# Patient Record
Sex: Female | Born: 1935
Health system: Southern US, Community
[De-identification: ages and names within clinical notes are randomized; demographics above are authoritative.]

## PROBLEM LIST (undated history)

## (undated) DIAGNOSIS — I4891 Unspecified atrial fibrillation: Secondary | ICD-10-CM

## (undated) DIAGNOSIS — D6800 Von Willebrand disease, unspecified: Secondary | ICD-10-CM

## (undated) DIAGNOSIS — I2510 Atherosclerotic heart disease of native coronary artery without angina pectoris: Secondary | ICD-10-CM

## (undated) DIAGNOSIS — E039 Hypothyroidism, unspecified: Secondary | ICD-10-CM

## (undated) DIAGNOSIS — Z950 Presence of cardiac pacemaker: Secondary | ICD-10-CM

## (undated) DIAGNOSIS — J449 Chronic obstructive pulmonary disease, unspecified: Secondary | ICD-10-CM

## (undated) DIAGNOSIS — I5042 Chronic combined systolic (congestive) and diastolic (congestive) heart failure: Secondary | ICD-10-CM

## (undated) DIAGNOSIS — D510 Vitamin B12 deficiency anemia due to intrinsic factor deficiency: Secondary | ICD-10-CM

## (undated) DIAGNOSIS — K829 Disease of gallbladder, unspecified: Secondary | ICD-10-CM

## (undated) HISTORY — PX: APPENDECTOMY: SHX54

## (undated) HISTORY — DX: Unspecified atrial fibrillation: I48.91

## (undated) HISTORY — DX: Chronic obstructive pulmonary disease, unspecified: J44.9

## (undated) HISTORY — PX: HERNIA REPAIR: SHX51

## (undated) HISTORY — DX: Vitamin B12 deficiency anemia due to intrinsic factor deficiency: D51.0

## (undated) HISTORY — DX: Atherosclerotic heart disease of native coronary artery without angina pectoris: I25.10

## (undated) HISTORY — DX: Disease of gallbladder, unspecified: K82.9

## (undated) HISTORY — DX: Von Willebrand disease, unspecified: D68.00

## (undated) HISTORY — DX: Von Willebrand's disease: D68.0

## (undated) HISTORY — DX: Chronic combined systolic (congestive) and diastolic (congestive) heart failure: I50.42

## (undated) HISTORY — PX: MANDIBULAR RECONSTRUCTION W/ ILIAC BONE GRAFT: SHX1995

## (undated) HISTORY — DX: Hypothyroidism, unspecified: E03.9

## (undated) HISTORY — DX: Presence of cardiac pacemaker: Z95.0

## (undated) HISTORY — PX: CORONARY ARTERY BYPASS GRAFT: SHX141

---

## 1997-10-18 ENCOUNTER — Ambulatory Visit (HOSPITAL_COMMUNITY): Admission: RE | Admit: 1997-10-18 | Discharge: 1997-10-18 | Payer: Self-pay | Admitting: *Deleted

## 1997-11-16 ENCOUNTER — Other Ambulatory Visit: Admission: RE | Admit: 1997-11-16 | Discharge: 1997-11-16 | Payer: Self-pay | Admitting: *Deleted

## 1998-06-21 ENCOUNTER — Encounter: Payer: Self-pay | Admitting: Cardiology

## 1998-06-21 ENCOUNTER — Inpatient Hospital Stay (HOSPITAL_COMMUNITY): Admission: RE | Admit: 1998-06-21 | Discharge: 1998-07-01 | Payer: Self-pay | Admitting: Cardiology

## 1998-06-24 ENCOUNTER — Encounter: Payer: Self-pay | Admitting: Thoracic Surgery (Cardiothoracic Vascular Surgery)

## 1998-06-25 ENCOUNTER — Encounter: Payer: Self-pay | Admitting: Thoracic Surgery (Cardiothoracic Vascular Surgery)

## 1998-06-26 ENCOUNTER — Encounter: Payer: Self-pay | Admitting: Thoracic Surgery (Cardiothoracic Vascular Surgery)

## 1998-07-05 ENCOUNTER — Inpatient Hospital Stay (HOSPITAL_COMMUNITY): Admission: EM | Admit: 1998-07-05 | Discharge: 1998-07-08 | Payer: Self-pay | Admitting: Emergency Medicine

## 1998-07-05 ENCOUNTER — Encounter: Payer: Self-pay | Admitting: Emergency Medicine

## 1999-10-07 ENCOUNTER — Ambulatory Visit (HOSPITAL_COMMUNITY): Admission: RE | Admit: 1999-10-07 | Discharge: 1999-10-07 | Payer: Self-pay | Admitting: Gastroenterology

## 2000-02-16 ENCOUNTER — Encounter: Payer: Self-pay | Admitting: Endocrinology

## 2000-02-16 ENCOUNTER — Encounter: Admission: RE | Admit: 2000-02-16 | Discharge: 2000-02-16 | Payer: Self-pay | Admitting: Endocrinology

## 2000-06-08 ENCOUNTER — Encounter (INDEPENDENT_AMBULATORY_CARE_PROVIDER_SITE_OTHER): Payer: Self-pay

## 2000-06-08 ENCOUNTER — Ambulatory Visit (HOSPITAL_COMMUNITY): Admission: RE | Admit: 2000-06-08 | Discharge: 2000-06-08 | Payer: Self-pay | Admitting: Gastroenterology

## 2000-07-03 ENCOUNTER — Encounter: Payer: Self-pay | Admitting: Emergency Medicine

## 2000-07-03 ENCOUNTER — Emergency Department (HOSPITAL_COMMUNITY): Admission: EM | Admit: 2000-07-03 | Discharge: 2000-07-03 | Payer: Self-pay | Admitting: Emergency Medicine

## 2000-07-09 ENCOUNTER — Emergency Department (HOSPITAL_COMMUNITY): Admission: EM | Admit: 2000-07-09 | Discharge: 2000-07-09 | Payer: Self-pay | Admitting: Emergency Medicine

## 2001-01-03 ENCOUNTER — Other Ambulatory Visit: Admission: RE | Admit: 2001-01-03 | Discharge: 2001-01-03 | Payer: Self-pay | Admitting: *Deleted

## 2001-02-17 ENCOUNTER — Encounter: Payer: Self-pay | Admitting: Endocrinology

## 2001-02-17 ENCOUNTER — Encounter: Admission: RE | Admit: 2001-02-17 | Discharge: 2001-02-17 | Payer: Self-pay | Admitting: Endocrinology

## 2001-12-19 ENCOUNTER — Encounter: Payer: Self-pay | Admitting: *Deleted

## 2001-12-19 ENCOUNTER — Encounter: Admission: RE | Admit: 2001-12-19 | Discharge: 2001-12-19 | Payer: Self-pay | Admitting: *Deleted

## 2002-03-02 ENCOUNTER — Encounter: Payer: Self-pay | Admitting: Endocrinology

## 2002-03-02 ENCOUNTER — Encounter: Admission: RE | Admit: 2002-03-02 | Discharge: 2002-03-02 | Payer: Self-pay | Admitting: Endocrinology

## 2002-09-22 ENCOUNTER — Ambulatory Visit (HOSPITAL_COMMUNITY): Admission: RE | Admit: 2002-09-22 | Discharge: 2002-09-22 | Payer: Self-pay | Admitting: Oncology

## 2003-03-15 ENCOUNTER — Encounter: Admission: RE | Admit: 2003-03-15 | Discharge: 2003-03-15 | Payer: Self-pay | Admitting: Internal Medicine

## 2003-04-26 ENCOUNTER — Encounter: Admission: RE | Admit: 2003-04-26 | Discharge: 2003-04-26 | Payer: Self-pay | Admitting: Endocrinology

## 2003-05-04 ENCOUNTER — Encounter: Admission: RE | Admit: 2003-05-04 | Discharge: 2003-05-04 | Payer: Self-pay | Admitting: Endocrinology

## 2003-09-20 ENCOUNTER — Ambulatory Visit (HOSPITAL_COMMUNITY): Admission: RE | Admit: 2003-09-20 | Discharge: 2003-09-20 | Payer: Self-pay | Admitting: Gastroenterology

## 2003-09-20 ENCOUNTER — Encounter (INDEPENDENT_AMBULATORY_CARE_PROVIDER_SITE_OTHER): Payer: Self-pay | Admitting: Specialist

## 2003-11-10 ENCOUNTER — Encounter: Payer: Self-pay | Admitting: Emergency Medicine

## 2003-11-11 ENCOUNTER — Inpatient Hospital Stay (HOSPITAL_COMMUNITY): Admission: AD | Admit: 2003-11-11 | Discharge: 2003-11-16 | Payer: Self-pay | Admitting: Endocrinology

## 2004-01-19 ENCOUNTER — Inpatient Hospital Stay (HOSPITAL_COMMUNITY): Admission: EM | Admit: 2004-01-19 | Discharge: 2004-01-21 | Payer: Self-pay | Admitting: Emergency Medicine

## 2004-01-26 ENCOUNTER — Inpatient Hospital Stay (HOSPITAL_COMMUNITY): Admission: EM | Admit: 2004-01-26 | Discharge: 2004-01-30 | Payer: Self-pay | Admitting: Emergency Medicine

## 2004-01-28 ENCOUNTER — Encounter: Payer: Self-pay | Admitting: Cardiology

## 2004-04-01 ENCOUNTER — Encounter: Admission: RE | Admit: 2004-04-01 | Discharge: 2004-04-01 | Payer: Self-pay | Admitting: Endocrinology

## 2004-07-03 ENCOUNTER — Ambulatory Visit: Payer: Self-pay | Admitting: Internal Medicine

## 2004-07-18 ENCOUNTER — Ambulatory Visit: Payer: Self-pay | Admitting: Internal Medicine

## 2004-09-02 ENCOUNTER — Ambulatory Visit: Payer: Self-pay | Admitting: Internal Medicine

## 2004-09-26 ENCOUNTER — Ambulatory Visit: Payer: Self-pay | Admitting: Internal Medicine

## 2004-11-17 ENCOUNTER — Ambulatory Visit: Payer: Self-pay | Admitting: Cardiology

## 2004-12-17 ENCOUNTER — Ambulatory Visit: Payer: Self-pay | Admitting: Cardiology

## 2005-01-01 ENCOUNTER — Ambulatory Visit: Payer: Self-pay

## 2005-01-26 ENCOUNTER — Ambulatory Visit: Payer: Self-pay | Admitting: Internal Medicine

## 2005-03-18 ENCOUNTER — Ambulatory Visit: Payer: Self-pay | Admitting: Cardiology

## 2005-05-12 ENCOUNTER — Ambulatory Visit: Payer: Self-pay | Admitting: Internal Medicine

## 2005-05-27 ENCOUNTER — Encounter: Admission: RE | Admit: 2005-05-27 | Discharge: 2005-05-27 | Payer: Self-pay | Admitting: Endocrinology

## 2005-07-28 ENCOUNTER — Ambulatory Visit: Payer: Self-pay | Admitting: Internal Medicine

## 2005-08-04 ENCOUNTER — Ambulatory Visit: Payer: Self-pay | Admitting: Cardiology

## 2005-08-10 ENCOUNTER — Encounter: Admission: RE | Admit: 2005-08-10 | Discharge: 2005-08-10 | Payer: Self-pay | Admitting: Gastroenterology

## 2005-09-08 ENCOUNTER — Ambulatory Visit: Payer: Self-pay | Admitting: Internal Medicine

## 2005-11-18 ENCOUNTER — Ambulatory Visit: Payer: Self-pay | Admitting: Cardiology

## 2005-11-23 ENCOUNTER — Ambulatory Visit: Payer: Self-pay

## 2005-11-23 ENCOUNTER — Encounter: Payer: Self-pay | Admitting: Cardiology

## 2005-12-01 ENCOUNTER — Ambulatory Visit: Payer: Self-pay | Admitting: Internal Medicine

## 2006-04-30 ENCOUNTER — Ambulatory Visit: Payer: Self-pay | Admitting: Internal Medicine

## 2006-05-14 ENCOUNTER — Ambulatory Visit: Payer: Self-pay | Admitting: Cardiology

## 2006-05-17 ENCOUNTER — Ambulatory Visit: Payer: Self-pay | Admitting: Internal Medicine

## 2006-05-20 ENCOUNTER — Ambulatory Visit: Payer: Self-pay | Admitting: Internal Medicine

## 2006-05-28 ENCOUNTER — Encounter: Admission: RE | Admit: 2006-05-28 | Discharge: 2006-05-28 | Payer: Self-pay | Admitting: Endocrinology

## 2006-07-24 ENCOUNTER — Ambulatory Visit: Payer: Self-pay | Admitting: Internal Medicine

## 2006-07-24 ENCOUNTER — Inpatient Hospital Stay (HOSPITAL_COMMUNITY): Admission: EM | Admit: 2006-07-24 | Discharge: 2006-07-28 | Payer: Self-pay | Admitting: Emergency Medicine

## 2006-09-13 ENCOUNTER — Ambulatory Visit: Payer: Self-pay | Admitting: Internal Medicine

## 2006-10-12 ENCOUNTER — Ambulatory Visit: Payer: Self-pay | Admitting: Cardiology

## 2007-02-01 ENCOUNTER — Ambulatory Visit: Payer: Self-pay

## 2007-03-14 ENCOUNTER — Ambulatory Visit: Payer: Self-pay | Admitting: Internal Medicine

## 2007-03-15 ENCOUNTER — Telehealth (INDEPENDENT_AMBULATORY_CARE_PROVIDER_SITE_OTHER): Payer: Self-pay | Admitting: *Deleted

## 2007-03-21 ENCOUNTER — Ambulatory Visit: Payer: Self-pay

## 2007-04-04 ENCOUNTER — Ambulatory Visit: Payer: Self-pay | Admitting: Cardiology

## 2007-04-08 ENCOUNTER — Inpatient Hospital Stay (HOSPITAL_COMMUNITY): Admission: EM | Admit: 2007-04-08 | Discharge: 2007-04-15 | Payer: Self-pay | Admitting: Cardiology

## 2007-04-08 ENCOUNTER — Ambulatory Visit: Payer: Self-pay | Admitting: Cardiovascular Disease

## 2007-04-08 ENCOUNTER — Ambulatory Visit: Payer: Self-pay | Admitting: Internal Medicine

## 2007-04-11 ENCOUNTER — Encounter (INDEPENDENT_AMBULATORY_CARE_PROVIDER_SITE_OTHER): Payer: Self-pay | Admitting: Cardiovascular Disease

## 2007-04-22 ENCOUNTER — Ambulatory Visit: Payer: Self-pay | Admitting: Cardiology

## 2007-04-22 ENCOUNTER — Inpatient Hospital Stay (HOSPITAL_COMMUNITY): Admission: EM | Admit: 2007-04-22 | Discharge: 2007-05-02 | Payer: Self-pay | Admitting: Emergency Medicine

## 2007-04-22 ENCOUNTER — Ambulatory Visit: Payer: Self-pay | Admitting: Internal Medicine

## 2007-05-02 ENCOUNTER — Telehealth: Payer: Self-pay | Admitting: Internal Medicine

## 2007-05-16 ENCOUNTER — Ambulatory Visit: Payer: Self-pay | Admitting: Cardiology

## 2007-05-25 ENCOUNTER — Ambulatory Visit: Payer: Self-pay | Admitting: Cardiology

## 2007-05-26 ENCOUNTER — Inpatient Hospital Stay (HOSPITAL_COMMUNITY): Admission: EM | Admit: 2007-05-26 | Discharge: 2007-06-04 | Payer: Self-pay | Admitting: Emergency Medicine

## 2007-05-26 ENCOUNTER — Ambulatory Visit: Payer: Self-pay | Admitting: Internal Medicine

## 2007-05-26 DIAGNOSIS — K829 Disease of gallbladder, unspecified: Secondary | ICD-10-CM | POA: Insufficient documentation

## 2007-05-26 DIAGNOSIS — J209 Acute bronchitis, unspecified: Secondary | ICD-10-CM

## 2007-05-26 DIAGNOSIS — E039 Hypothyroidism, unspecified: Secondary | ICD-10-CM | POA: Insufficient documentation

## 2007-05-26 DIAGNOSIS — Z9189 Other specified personal risk factors, not elsewhere classified: Secondary | ICD-10-CM

## 2007-05-26 DIAGNOSIS — I2510 Atherosclerotic heart disease of native coronary artery without angina pectoris: Secondary | ICD-10-CM | POA: Insufficient documentation

## 2007-05-26 DIAGNOSIS — J4489 Other specified chronic obstructive pulmonary disease: Secondary | ICD-10-CM | POA: Insufficient documentation

## 2007-05-26 DIAGNOSIS — J449 Chronic obstructive pulmonary disease, unspecified: Secondary | ICD-10-CM

## 2007-05-26 DIAGNOSIS — D510 Vitamin B12 deficiency anemia due to intrinsic factor deficiency: Secondary | ICD-10-CM

## 2007-05-26 DIAGNOSIS — D680 Von Willebrand's disease: Secondary | ICD-10-CM

## 2007-05-26 DIAGNOSIS — I4891 Unspecified atrial fibrillation: Secondary | ICD-10-CM

## 2007-05-30 ENCOUNTER — Encounter: Payer: Self-pay | Admitting: Internal Medicine

## 2007-05-31 ENCOUNTER — Encounter: Payer: Self-pay | Admitting: Internal Medicine

## 2007-06-16 ENCOUNTER — Telehealth (INDEPENDENT_AMBULATORY_CARE_PROVIDER_SITE_OTHER): Payer: Self-pay | Admitting: *Deleted

## 2007-06-17 ENCOUNTER — Ambulatory Visit: Payer: Self-pay | Admitting: Internal Medicine

## 2007-06-22 ENCOUNTER — Telehealth (INDEPENDENT_AMBULATORY_CARE_PROVIDER_SITE_OTHER): Payer: Self-pay | Admitting: *Deleted

## 2007-06-23 ENCOUNTER — Encounter: Payer: Self-pay | Admitting: Internal Medicine

## 2007-06-30 ENCOUNTER — Encounter: Payer: Self-pay | Admitting: Internal Medicine

## 2007-07-04 ENCOUNTER — Encounter: Payer: Self-pay | Admitting: Internal Medicine

## 2007-07-08 ENCOUNTER — Telehealth (INDEPENDENT_AMBULATORY_CARE_PROVIDER_SITE_OTHER): Payer: Self-pay | Admitting: *Deleted

## 2007-07-09 ENCOUNTER — Emergency Department (HOSPITAL_COMMUNITY): Admission: EM | Admit: 2007-07-09 | Discharge: 2007-07-09 | Payer: Self-pay | Admitting: Emergency Medicine

## 2007-07-11 ENCOUNTER — Ambulatory Visit: Payer: Self-pay | Admitting: Internal Medicine

## 2007-07-13 ENCOUNTER — Telehealth: Payer: Self-pay | Admitting: Internal Medicine

## 2007-07-14 ENCOUNTER — Emergency Department (HOSPITAL_COMMUNITY): Admission: EM | Admit: 2007-07-14 | Discharge: 2007-07-15 | Payer: Self-pay | Admitting: Emergency Medicine

## 2007-08-08 ENCOUNTER — Ambulatory Visit: Payer: Self-pay | Admitting: Cardiology

## 2007-08-08 ENCOUNTER — Ambulatory Visit: Payer: Self-pay | Admitting: Internal Medicine

## 2007-08-18 ENCOUNTER — Encounter: Payer: Self-pay | Admitting: Internal Medicine

## 2007-08-28 ENCOUNTER — Ambulatory Visit: Payer: Self-pay | Admitting: Internal Medicine

## 2007-09-01 ENCOUNTER — Encounter: Payer: Self-pay | Admitting: Internal Medicine

## 2007-09-15 ENCOUNTER — Encounter: Payer: Self-pay | Admitting: Internal Medicine

## 2007-11-07 ENCOUNTER — Ambulatory Visit: Payer: Self-pay | Admitting: Internal Medicine

## 2007-11-30 ENCOUNTER — Encounter: Payer: Self-pay | Admitting: Internal Medicine

## 2007-12-09 ENCOUNTER — Encounter: Payer: Self-pay | Admitting: Internal Medicine

## 2007-12-14 ENCOUNTER — Telehealth (INDEPENDENT_AMBULATORY_CARE_PROVIDER_SITE_OTHER): Payer: Self-pay | Admitting: *Deleted

## 2007-12-16 ENCOUNTER — Telehealth (INDEPENDENT_AMBULATORY_CARE_PROVIDER_SITE_OTHER): Payer: Self-pay | Admitting: *Deleted

## 2007-12-27 ENCOUNTER — Encounter: Admission: RE | Admit: 2007-12-27 | Discharge: 2007-12-27 | Payer: Self-pay | Admitting: Endocrinology

## 2007-12-30 ENCOUNTER — Telehealth (INDEPENDENT_AMBULATORY_CARE_PROVIDER_SITE_OTHER): Payer: Self-pay | Admitting: *Deleted

## 2008-02-03 ENCOUNTER — Telehealth (INDEPENDENT_AMBULATORY_CARE_PROVIDER_SITE_OTHER): Payer: Self-pay | Admitting: *Deleted

## 2008-02-03 ENCOUNTER — Telehealth: Payer: Self-pay | Admitting: Internal Medicine

## 2008-02-10 ENCOUNTER — Ambulatory Visit: Payer: Self-pay

## 2008-02-10 ENCOUNTER — Ambulatory Visit: Payer: Self-pay | Admitting: Cardiology

## 2008-02-17 ENCOUNTER — Ambulatory Visit: Payer: Self-pay

## 2008-02-17 ENCOUNTER — Encounter: Payer: Self-pay | Admitting: Cardiology

## 2008-02-24 ENCOUNTER — Ambulatory Visit: Payer: Self-pay | Admitting: Cardiology

## 2008-02-24 ENCOUNTER — Inpatient Hospital Stay (HOSPITAL_COMMUNITY): Admission: AD | Admit: 2008-02-24 | Discharge: 2008-02-28 | Payer: Self-pay | Admitting: Cardiology

## 2008-02-29 ENCOUNTER — Inpatient Hospital Stay (HOSPITAL_COMMUNITY): Admission: EM | Admit: 2008-02-29 | Discharge: 2008-03-18 | Payer: Self-pay | Admitting: Cardiology

## 2008-02-29 ENCOUNTER — Ambulatory Visit: Payer: Self-pay | Admitting: Cardiovascular Disease

## 2008-03-01 ENCOUNTER — Ambulatory Visit: Payer: Self-pay | Admitting: Oncology

## 2008-03-07 ENCOUNTER — Encounter: Payer: Self-pay | Admitting: Cardiology

## 2008-03-21 ENCOUNTER — Ambulatory Visit: Payer: Self-pay | Admitting: Cardiovascular Disease

## 2008-03-26 ENCOUNTER — Ambulatory Visit: Payer: Self-pay | Admitting: Cardiology

## 2008-03-26 ENCOUNTER — Ambulatory Visit: Payer: Self-pay | Admitting: Cardiovascular Disease

## 2008-03-26 LAB — CONVERTED CEMR LAB
Eosinophils Relative: 2.2 % (ref 0.0–5.0)
HCT: 38.8 % (ref 36.0–46.0)
Hemoglobin: 13.6 g/dL (ref 12.0–15.0)
Lymphocytes Relative: 27 % (ref 12.0–46.0)
Monocytes Absolute: 0.7 10*3/uL (ref 0.1–1.0)
Monocytes Relative: 6.9 % (ref 3.0–12.0)
Neutro Abs: 6.1 10*3/uL (ref 1.4–7.7)
RBC: 4.5 M/uL (ref 3.87–5.11)
WBC: 9.6 10*3/uL (ref 4.5–10.5)

## 2008-04-02 ENCOUNTER — Ambulatory Visit: Payer: Self-pay | Admitting: Cardiology

## 2008-04-03 ENCOUNTER — Inpatient Hospital Stay (HOSPITAL_COMMUNITY): Admission: EM | Admit: 2008-04-03 | Discharge: 2008-04-10 | Payer: Self-pay | Admitting: Emergency Medicine

## 2008-04-09 ENCOUNTER — Encounter: Payer: Self-pay | Admitting: Cardiology

## 2008-04-12 ENCOUNTER — Ambulatory Visit: Payer: Self-pay | Admitting: Cardiology

## 2008-04-12 LAB — CONVERTED CEMR LAB
Basophils Absolute: 0.1 10*3/uL (ref 0.0–0.1)
Basophils Relative: 0.7 % (ref 0.0–3.0)
Eosinophils Absolute: 0.2 10*3/uL (ref 0.0–0.7)
Eosinophils Relative: 2.2 % (ref 0.0–5.0)
HCT: 40.3 % (ref 36.0–46.0)
MCHC: 34.8 g/dL (ref 30.0–36.0)
MCV: 87.4 fL (ref 78.0–100.0)
Monocytes Absolute: 0.6 10*3/uL (ref 0.1–1.0)
Neutrophils Relative %: 57.2 % (ref 43.0–77.0)
Platelets: 296 10*3/uL (ref 150–400)
WBC: 8.4 10*3/uL (ref 4.5–10.5)

## 2008-04-18 ENCOUNTER — Ambulatory Visit: Payer: Self-pay | Admitting: Cardiovascular Disease

## 2008-04-26 ENCOUNTER — Ambulatory Visit: Payer: Self-pay | Admitting: Internal Medicine

## 2008-05-02 ENCOUNTER — Inpatient Hospital Stay (HOSPITAL_COMMUNITY): Admission: EM | Admit: 2008-05-02 | Discharge: 2008-05-03 | Payer: Self-pay | Admitting: Emergency Medicine

## 2008-05-09 ENCOUNTER — Ambulatory Visit: Payer: Self-pay | Admitting: Cardiology

## 2008-05-09 LAB — CONVERTED CEMR LAB
ALT: 17 units/L (ref 0–35)
AST: 22 units/L (ref 0–37)
Alkaline Phosphatase: 107 units/L (ref 39–117)
Basophils Absolute: 0.1 10*3/uL (ref 0.0–0.1)
Bilirubin, Direct: 0.1 mg/dL (ref 0.0–0.3)
CO2: 32 meq/L (ref 19–32)
Chloride: 94 meq/L — ABNORMAL LOW (ref 96–112)
Glucose, Bld: 425 mg/dL — ABNORMAL HIGH (ref 70–99)
Hemoglobin: 15 g/dL (ref 12.0–15.0)
Lymphocytes Relative: 29.1 % (ref 12.0–46.0)
MCHC: 34.1 g/dL (ref 30.0–36.0)
Monocytes Relative: 4.2 % (ref 3.0–12.0)
Neutrophils Relative %: 64.3 % (ref 43.0–77.0)
RBC: 5.04 M/uL (ref 3.87–5.11)
RDW: 14 % (ref 11.5–14.6)
Sodium: 135 meq/L (ref 135–145)
Total Bilirubin: 0.8 mg/dL (ref 0.3–1.2)
Total Protein: 6.2 g/dL (ref 6.0–8.3)

## 2008-05-25 ENCOUNTER — Inpatient Hospital Stay (HOSPITAL_COMMUNITY): Admission: AD | Admit: 2008-05-25 | Discharge: 2008-06-02 | Payer: Self-pay | Admitting: Cardiology

## 2008-05-25 ENCOUNTER — Ambulatory Visit: Payer: Self-pay | Admitting: Cardiology

## 2008-05-30 ENCOUNTER — Encounter: Payer: Self-pay | Admitting: Cardiology

## 2008-06-03 ENCOUNTER — Ambulatory Visit: Payer: Self-pay | Admitting: Cardiology

## 2008-06-04 ENCOUNTER — Emergency Department (HOSPITAL_COMMUNITY): Admission: EM | Admit: 2008-06-04 | Discharge: 2008-06-05 | Payer: Self-pay | Admitting: Emergency Medicine

## 2008-06-13 ENCOUNTER — Ambulatory Visit: Payer: Self-pay | Admitting: Cardiology

## 2008-06-13 ENCOUNTER — Inpatient Hospital Stay (HOSPITAL_COMMUNITY): Admission: AD | Admit: 2008-06-13 | Discharge: 2008-06-19 | Payer: Self-pay | Admitting: Cardiology

## 2008-06-13 ENCOUNTER — Ambulatory Visit: Payer: Self-pay | Admitting: Internal Medicine

## 2008-06-13 ENCOUNTER — Ambulatory Visit: Payer: Self-pay

## 2008-06-25 ENCOUNTER — Telehealth (INDEPENDENT_AMBULATORY_CARE_PROVIDER_SITE_OTHER): Payer: Self-pay | Admitting: *Deleted

## 2008-06-29 ENCOUNTER — Ambulatory Visit: Payer: Self-pay | Admitting: Internal Medicine

## 2008-07-09 ENCOUNTER — Ambulatory Visit: Payer: Self-pay | Admitting: Cardiology

## 2008-07-17 ENCOUNTER — Ambulatory Visit: Payer: Self-pay | Admitting: Internal Medicine

## 2008-08-03 ENCOUNTER — Emergency Department (HOSPITAL_COMMUNITY): Admission: EM | Admit: 2008-08-03 | Discharge: 2008-08-03 | Payer: Self-pay | Admitting: Emergency Medicine

## 2008-08-06 ENCOUNTER — Ambulatory Visit: Payer: Self-pay | Admitting: Internal Medicine

## 2008-08-09 ENCOUNTER — Telehealth (INDEPENDENT_AMBULATORY_CARE_PROVIDER_SITE_OTHER): Payer: Self-pay | Admitting: *Deleted

## 2008-08-10 ENCOUNTER — Ambulatory Visit: Payer: Self-pay | Admitting: Internal Medicine

## 2008-08-10 ENCOUNTER — Inpatient Hospital Stay (HOSPITAL_COMMUNITY): Admission: EM | Admit: 2008-08-10 | Discharge: 2008-08-21 | Payer: Self-pay | Admitting: Emergency Medicine

## 2008-08-10 ENCOUNTER — Telehealth: Payer: Self-pay | Admitting: Nurse Practitioner

## 2008-08-10 ENCOUNTER — Ambulatory Visit: Payer: Self-pay | Admitting: Cardiovascular Disease

## 2008-08-13 ENCOUNTER — Encounter: Payer: Self-pay | Admitting: Internal Medicine

## 2008-08-14 ENCOUNTER — Telehealth: Payer: Self-pay | Admitting: Cardiology

## 2008-08-22 ENCOUNTER — Telehealth: Payer: Self-pay | Admitting: Cardiology

## 2008-08-23 ENCOUNTER — Encounter: Payer: Self-pay | Admitting: Cardiology

## 2008-09-07 ENCOUNTER — Telehealth: Payer: Self-pay | Admitting: Cardiology

## 2008-09-19 ENCOUNTER — Ambulatory Visit: Payer: Self-pay | Admitting: Internal Medicine

## 2008-09-19 DIAGNOSIS — I509 Heart failure, unspecified: Secondary | ICD-10-CM | POA: Insufficient documentation

## 2008-09-19 DIAGNOSIS — Z950 Presence of cardiac pacemaker: Secondary | ICD-10-CM

## 2008-09-25 ENCOUNTER — Encounter: Payer: Self-pay | Admitting: *Deleted

## 2008-09-28 ENCOUNTER — Encounter: Payer: Self-pay | Admitting: Cardiology

## 2008-10-03 ENCOUNTER — Encounter: Payer: Self-pay | Admitting: Cardiology

## 2008-10-08 ENCOUNTER — Encounter: Payer: Self-pay | Admitting: Cardiology

## 2008-10-10 ENCOUNTER — Emergency Department (HOSPITAL_COMMUNITY): Admission: EM | Admit: 2008-10-10 | Discharge: 2008-10-11 | Payer: Self-pay | Admitting: Emergency Medicine

## 2008-10-18 ENCOUNTER — Telehealth: Payer: Self-pay | Admitting: Cardiology

## 2008-10-18 ENCOUNTER — Encounter: Payer: Self-pay | Admitting: Cardiology

## 2008-10-19 ENCOUNTER — Encounter: Payer: Self-pay | Admitting: Cardiology

## 2008-10-19 ENCOUNTER — Encounter: Payer: Self-pay | Admitting: Emergency Medicine

## 2008-10-19 ENCOUNTER — Inpatient Hospital Stay (HOSPITAL_COMMUNITY): Admission: EM | Admit: 2008-10-19 | Discharge: 2008-10-24 | Payer: Self-pay | Admitting: Internal Medicine

## 2008-10-19 ENCOUNTER — Ambulatory Visit: Payer: Self-pay | Admitting: Cardiovascular Disease

## 2008-10-19 LAB — CONVERTED CEMR LAB
POC INR: 1.4
Prothrombin Time: 14.7 s

## 2008-10-20 ENCOUNTER — Encounter: Payer: Self-pay | Admitting: Cardiovascular Disease

## 2008-10-22 ENCOUNTER — Encounter: Payer: Self-pay | Admitting: Cardiovascular Disease

## 2008-10-31 ENCOUNTER — Encounter: Payer: Self-pay | Admitting: *Deleted

## 2008-11-20 ENCOUNTER — Encounter: Payer: Self-pay | Admitting: Cardiology

## 2008-11-28 ENCOUNTER — Telehealth (INDEPENDENT_AMBULATORY_CARE_PROVIDER_SITE_OTHER): Payer: Self-pay | Admitting: *Deleted

## 2008-12-04 ENCOUNTER — Encounter: Payer: Self-pay | Admitting: Internal Medicine

## 2008-12-05 ENCOUNTER — Encounter: Payer: Self-pay | Admitting: Internal Medicine

## 2008-12-07 ENCOUNTER — Inpatient Hospital Stay (HOSPITAL_COMMUNITY): Admission: EM | Admit: 2008-12-07 | Discharge: 2008-12-19 | Payer: Self-pay | Admitting: Emergency Medicine

## 2008-12-07 ENCOUNTER — Ambulatory Visit: Payer: Self-pay | Admitting: Cardiology

## 2008-12-28 ENCOUNTER — Encounter: Payer: Self-pay | Admitting: Internal Medicine

## 2009-01-02 ENCOUNTER — Encounter: Payer: Self-pay | Admitting: Cardiology

## 2009-01-02 LAB — CONVERTED CEMR LAB: Prothrombin Time: 18.5 s

## 2009-01-09 ENCOUNTER — Encounter: Payer: Self-pay | Admitting: Internal Medicine

## 2009-01-09 ENCOUNTER — Telehealth: Payer: Self-pay | Admitting: Internal Medicine

## 2009-01-09 LAB — CONVERTED CEMR LAB: POC INR: 2.6

## 2009-01-25 ENCOUNTER — Encounter: Payer: Self-pay | Admitting: Internal Medicine

## 2009-01-25 LAB — CONVERTED CEMR LAB
POC INR: 2.6
Prothrombin Time: 19.6 s

## 2009-02-15 ENCOUNTER — Encounter: Payer: Self-pay | Admitting: Cardiology

## 2009-02-15 LAB — CONVERTED CEMR LAB: POC INR: 3.3

## 2009-02-21 ENCOUNTER — Encounter: Payer: Self-pay | Admitting: Cardiology

## 2009-03-06 ENCOUNTER — Encounter: Payer: Self-pay | Admitting: Cardiology

## 2009-03-20 ENCOUNTER — Encounter: Payer: Self-pay | Admitting: Internal Medicine

## 2009-03-20 LAB — CONVERTED CEMR LAB
POC INR: 2
Prothrombin Time: 17.5 s

## 2009-03-26 ENCOUNTER — Encounter: Payer: Self-pay | Admitting: Cardiology

## 2009-04-11 ENCOUNTER — Encounter: Payer: Self-pay | Admitting: Cardiovascular Disease

## 2009-04-11 LAB — CONVERTED CEMR LAB: POC INR: 2.3

## 2009-04-12 ENCOUNTER — Encounter: Payer: Self-pay | Admitting: Cardiovascular Disease

## 2009-05-02 ENCOUNTER — Encounter: Payer: Self-pay | Admitting: Cardiovascular Disease

## 2009-05-02 LAB — CONVERTED CEMR LAB: POC INR: 1.9

## 2009-05-22 ENCOUNTER — Encounter: Payer: Self-pay | Admitting: Cardiology

## 2009-05-30 ENCOUNTER — Encounter: Payer: Self-pay | Admitting: Cardiology

## 2009-06-10 ENCOUNTER — Ambulatory Visit: Payer: Self-pay | Admitting: Internal Medicine

## 2009-06-10 ENCOUNTER — Inpatient Hospital Stay (HOSPITAL_COMMUNITY): Admission: EM | Admit: 2009-06-10 | Discharge: 2009-06-18 | Payer: Self-pay | Admitting: Emergency Medicine

## 2009-06-14 ENCOUNTER — Ambulatory Visit: Payer: Self-pay | Admitting: Vascular Surgery

## 2009-06-14 ENCOUNTER — Encounter (INDEPENDENT_AMBULATORY_CARE_PROVIDER_SITE_OTHER): Payer: Self-pay | Admitting: Internal Medicine

## 2009-06-20 ENCOUNTER — Ambulatory Visit: Payer: Self-pay | Admitting: Cardiology

## 2009-06-20 ENCOUNTER — Encounter (INDEPENDENT_AMBULATORY_CARE_PROVIDER_SITE_OTHER): Payer: Self-pay | Admitting: Cardiology

## 2009-06-22 ENCOUNTER — Encounter: Payer: Self-pay | Admitting: Internal Medicine

## 2009-06-24 ENCOUNTER — Ambulatory Visit: Payer: Self-pay | Admitting: Internal Medicine

## 2009-06-25 ENCOUNTER — Encounter: Payer: Self-pay | Admitting: Cardiology

## 2009-06-27 ENCOUNTER — Encounter: Payer: Self-pay | Admitting: Cardiology

## 2009-06-27 LAB — CONVERTED CEMR LAB: POC INR: 2.2

## 2009-07-02 ENCOUNTER — Encounter: Payer: Self-pay | Admitting: Cardiology

## 2009-07-03 ENCOUNTER — Inpatient Hospital Stay (HOSPITAL_COMMUNITY): Admission: EM | Admit: 2009-07-03 | Discharge: 2009-07-07 | Payer: Self-pay | Admitting: Emergency Medicine

## 2009-07-10 ENCOUNTER — Encounter: Payer: Self-pay | Admitting: Internal Medicine

## 2009-07-10 LAB — CONVERTED CEMR LAB
POC INR: 2
Prothrombin Time: 17.3 s

## 2009-07-16 ENCOUNTER — Encounter: Payer: Self-pay | Admitting: Cardiovascular Disease

## 2009-07-23 ENCOUNTER — Encounter: Payer: Self-pay | Admitting: Internal Medicine

## 2009-08-05 ENCOUNTER — Telehealth: Payer: Self-pay | Admitting: Internal Medicine

## 2009-08-06 ENCOUNTER — Encounter: Payer: Self-pay | Admitting: Cardiovascular Disease

## 2009-08-06 LAB — CONVERTED CEMR LAB: Prothrombin Time: 14.4 s

## 2009-08-12 ENCOUNTER — Encounter: Payer: Self-pay | Admitting: Cardiology

## 2009-08-13 ENCOUNTER — Encounter: Payer: Self-pay | Admitting: Cardiovascular Disease

## 2009-08-13 LAB — CONVERTED CEMR LAB: POC INR: 3.5

## 2009-08-20 ENCOUNTER — Encounter: Payer: Self-pay | Admitting: Cardiovascular Disease

## 2009-08-20 LAB — CONVERTED CEMR LAB: Prothrombin Time: 19 s

## 2009-08-27 ENCOUNTER — Encounter: Payer: Self-pay | Admitting: Cardiology

## 2009-08-27 LAB — CONVERTED CEMR LAB: POC INR: 2

## 2009-09-03 ENCOUNTER — Encounter: Payer: Self-pay | Admitting: Cardiovascular Disease

## 2009-09-03 LAB — CONVERTED CEMR LAB
POC INR: 3
Prothrombin Time: 20.8 s

## 2009-09-10 ENCOUNTER — Encounter: Payer: Self-pay | Admitting: Internal Medicine

## 2009-09-17 ENCOUNTER — Encounter: Payer: Self-pay | Admitting: Internal Medicine

## 2009-09-17 LAB — CONVERTED CEMR LAB
POC INR: 2.3
Prothrombin Time: 16.4 s

## 2009-09-24 ENCOUNTER — Encounter: Payer: Self-pay | Admitting: Internal Medicine

## 2009-09-24 LAB — CONVERTED CEMR LAB: POC INR: 1.4

## 2009-09-27 ENCOUNTER — Encounter (INDEPENDENT_AMBULATORY_CARE_PROVIDER_SITE_OTHER): Payer: Self-pay | Admitting: *Deleted

## 2009-09-30 ENCOUNTER — Encounter: Payer: Self-pay | Admitting: Cardiology

## 2009-09-30 LAB — CONVERTED CEMR LAB
POC INR: 1.6
Prothrombin Time: 15.5 s

## 2009-10-08 ENCOUNTER — Encounter: Payer: Self-pay | Admitting: Cardiovascular Disease

## 2009-10-08 LAB — CONVERTED CEMR LAB
POC INR: 1.7
Prothrombin Time: 16.2 s

## 2009-10-15 ENCOUNTER — Encounter: Payer: Self-pay | Admitting: Cardiology

## 2009-10-15 LAB — CONVERTED CEMR LAB: Prothrombin Time: 15.1 s

## 2009-10-21 ENCOUNTER — Encounter: Payer: Self-pay | Admitting: Cardiology

## 2009-10-29 ENCOUNTER — Encounter: Payer: Self-pay | Admitting: Internal Medicine

## 2009-11-13 ENCOUNTER — Encounter: Payer: Self-pay | Admitting: Cardiology

## 2009-11-13 LAB — CONVERTED CEMR LAB
POC INR: 1.6
Prothrombin Time: 19.2 s

## 2009-11-21 ENCOUNTER — Encounter (INDEPENDENT_AMBULATORY_CARE_PROVIDER_SITE_OTHER): Payer: Self-pay | Admitting: *Deleted

## 2009-11-26 ENCOUNTER — Encounter: Payer: Self-pay | Admitting: Cardiology

## 2009-11-26 LAB — CONVERTED CEMR LAB
POC INR: 2.3
Prothrombin Time: 27.9 s

## 2009-11-28 ENCOUNTER — Ambulatory Visit: Payer: Self-pay | Admitting: Internal Medicine

## 2009-11-28 ENCOUNTER — Encounter: Payer: Self-pay | Admitting: Internal Medicine

## 2009-12-10 ENCOUNTER — Encounter: Payer: Self-pay | Admitting: Cardiology

## 2009-12-10 LAB — CONVERTED CEMR LAB
POC INR: 3.1
Prothrombin Time: 37.1 s

## 2009-12-13 ENCOUNTER — Encounter: Payer: Self-pay | Admitting: Internal Medicine

## 2009-12-18 ENCOUNTER — Encounter (INDEPENDENT_AMBULATORY_CARE_PROVIDER_SITE_OTHER): Payer: Self-pay | Admitting: *Deleted

## 2009-12-20 ENCOUNTER — Telehealth: Payer: Self-pay | Admitting: Cardiology

## 2009-12-27 ENCOUNTER — Encounter: Payer: Self-pay | Admitting: Cardiology

## 2010-01-07 ENCOUNTER — Encounter: Payer: Self-pay | Admitting: Cardiovascular Disease

## 2010-01-07 LAB — CONVERTED CEMR LAB: Prothrombin Time: 23.4 s

## 2010-01-09 ENCOUNTER — Ambulatory Visit: Payer: Self-pay | Admitting: Internal Medicine

## 2010-01-21 ENCOUNTER — Encounter: Payer: Self-pay | Admitting: Internal Medicine

## 2010-01-21 ENCOUNTER — Encounter: Payer: Self-pay | Admitting: Cardiovascular Disease

## 2010-01-21 LAB — CONVERTED CEMR LAB: POC INR: 1.8

## 2010-02-03 ENCOUNTER — Telehealth: Payer: Self-pay | Admitting: Cardiology

## 2010-02-27 ENCOUNTER — Encounter: Payer: Self-pay | Admitting: Internal Medicine

## 2010-02-27 ENCOUNTER — Encounter: Payer: Self-pay | Admitting: Cardiology

## 2010-02-27 ENCOUNTER — Ambulatory Visit: Payer: Self-pay

## 2010-02-27 ENCOUNTER — Ambulatory Visit: Payer: Self-pay | Admitting: Cardiology

## 2010-02-27 DIAGNOSIS — I10 Essential (primary) hypertension: Secondary | ICD-10-CM | POA: Insufficient documentation

## 2010-02-27 LAB — CONVERTED CEMR LAB
Basophils Relative: 0.3 % (ref 0.0–3.0)
CO2: 26 meq/L (ref 19–32)
Calcium: 8.6 mg/dL (ref 8.4–10.5)
Chloride: 95 meq/L — ABNORMAL LOW (ref 96–112)
Eosinophils Absolute: 0.1 10*3/uL (ref 0.0–0.7)
HCT: 36.4 % (ref 36.0–46.0)
Hemoglobin: 12.2 g/dL (ref 12.0–15.0)
Lymphocytes Relative: 21.6 % (ref 12.0–46.0)
Lymphs Abs: 1.5 10*3/uL (ref 0.7–4.0)
MCHC: 33.4 g/dL (ref 30.0–36.0)
Neutro Abs: 5 10*3/uL (ref 1.4–7.7)
Potassium: 4.1 meq/L (ref 3.5–5.1)
RBC: 4.74 M/uL (ref 3.87–5.11)
Sodium: 133 meq/L — ABNORMAL LOW (ref 135–145)

## 2010-03-06 ENCOUNTER — Ambulatory Visit: Payer: Self-pay | Admitting: Internal Medicine

## 2010-03-09 ENCOUNTER — Inpatient Hospital Stay (HOSPITAL_COMMUNITY): Admission: EM | Admit: 2010-03-09 | Discharge: 2010-03-11 | Payer: Self-pay | Admitting: Cardiology

## 2010-03-09 ENCOUNTER — Ambulatory Visit: Payer: Self-pay | Admitting: Interventional Cardiology

## 2010-03-13 ENCOUNTER — Telehealth: Payer: Self-pay | Admitting: Cardiology

## 2010-03-19 ENCOUNTER — Encounter: Payer: Self-pay | Admitting: Internal Medicine

## 2010-03-25 ENCOUNTER — Emergency Department (HOSPITAL_COMMUNITY): Admission: EM | Admit: 2010-03-25 | Discharge: 2010-03-25 | Payer: Self-pay | Admitting: Emergency Medicine

## 2010-05-18 ENCOUNTER — Encounter: Payer: Self-pay | Admitting: Gastroenterology

## 2010-05-19 ENCOUNTER — Encounter: Payer: Self-pay | Admitting: Endocrinology

## 2010-05-27 ENCOUNTER — Encounter: Payer: Self-pay | Admitting: Cardiology

## 2010-05-27 ENCOUNTER — Inpatient Hospital Stay (HOSPITAL_COMMUNITY)
Admission: EM | Admit: 2010-05-27 | Discharge: 2010-05-30 | DRG: 313 | Disposition: A | Discharge status: Home Health | Payer: Medicare Other | Attending: Family Medicine | Admitting: Family Medicine

## 2010-05-27 DIAGNOSIS — I509 Heart failure, unspecified: Secondary | ICD-10-CM | POA: Diagnosis present

## 2010-05-27 DIAGNOSIS — E119 Type 2 diabetes mellitus without complications: Secondary | ICD-10-CM | POA: Diagnosis present

## 2010-05-27 DIAGNOSIS — E876 Hypokalemia: Secondary | ICD-10-CM | POA: Diagnosis present

## 2010-05-27 DIAGNOSIS — I2510 Atherosclerotic heart disease of native coronary artery without angina pectoris: Secondary | ICD-10-CM | POA: Diagnosis present

## 2010-05-27 DIAGNOSIS — J449 Chronic obstructive pulmonary disease, unspecified: Secondary | ICD-10-CM | POA: Diagnosis present

## 2010-05-27 DIAGNOSIS — R072 Precordial pain: Secondary | ICD-10-CM

## 2010-05-27 DIAGNOSIS — E039 Hypothyroidism, unspecified: Secondary | ICD-10-CM | POA: Diagnosis present

## 2010-05-27 DIAGNOSIS — F0390 Unspecified dementia without behavioral disturbance: Secondary | ICD-10-CM | POA: Diagnosis present

## 2010-05-27 DIAGNOSIS — Z7901 Long term (current) use of anticoagulants: Secondary | ICD-10-CM

## 2010-05-27 DIAGNOSIS — I5032 Chronic diastolic (congestive) heart failure: Secondary | ICD-10-CM | POA: Diagnosis present

## 2010-05-27 DIAGNOSIS — Z950 Presence of cardiac pacemaker: Secondary | ICD-10-CM

## 2010-05-27 DIAGNOSIS — Z951 Presence of aortocoronary bypass graft: Secondary | ICD-10-CM

## 2010-05-27 DIAGNOSIS — J4489 Other specified chronic obstructive pulmonary disease: Secondary | ICD-10-CM | POA: Diagnosis present

## 2010-05-27 DIAGNOSIS — Z794 Long term (current) use of insulin: Secondary | ICD-10-CM

## 2010-05-27 DIAGNOSIS — R0789 Other chest pain: Principal | ICD-10-CM | POA: Diagnosis present

## 2010-05-27 DIAGNOSIS — R197 Diarrhea, unspecified: Secondary | ICD-10-CM | POA: Diagnosis present

## 2010-05-27 DIAGNOSIS — D638 Anemia in other chronic diseases classified elsewhere: Secondary | ICD-10-CM | POA: Diagnosis present

## 2010-05-27 LAB — HEMOGLOBIN A1C: Mean Plasma Glucose: 266 mg/dL — ABNORMAL HIGH (ref <117)

## 2010-05-27 LAB — CK TOTAL AND CKMB (NOT AT ARMC)
CK, MB: 2.1 ng/mL (ref 0.3–4.0)
Relative Index: 1.8 (ref 0.0–2.5)
Total CK: 116 U/L (ref 7–177)

## 2010-05-27 LAB — CBC
HCT: 37.9 % (ref 36.0–46.0)
Hemoglobin: 11.7 g/dL — ABNORMAL LOW (ref 12.0–15.0)
MCH: 23.8 pg — ABNORMAL LOW (ref 26.0–34.0)
MCV: 77 fL — ABNORMAL LOW (ref 78.0–100.0)
RBC: 4.92 MIL/uL (ref 3.87–5.11)
WBC: 9.2 10*3/uL (ref 4.0–10.5)

## 2010-05-27 LAB — DIFFERENTIAL
Lymphocytes Relative: 19 % (ref 12–46)
Lymphs Abs: 1.8 10*3/uL (ref 0.7–4.0)
Monocytes Relative: 8 % (ref 3–12)
Neutrophils Relative %: 72 % (ref 43–77)

## 2010-05-27 LAB — URINALYSIS, ROUTINE W REFLEX MICROSCOPIC
Bilirubin Urine: NEGATIVE
Hgb urine dipstick: NEGATIVE
Ketones, ur: NEGATIVE mg/dL
Protein, ur: NEGATIVE mg/dL
Urobilinogen, UA: 0.2 mg/dL (ref 0.0–1.0)

## 2010-05-27 LAB — CARDIAC PANEL(CRET KIN+CKTOT+MB+TROPI)
CK, MB: 2.6 ng/mL (ref 0.3–4.0)
CK, MB: 2.9 ng/mL (ref 0.3–4.0)
Relative Index: 2.2 (ref 0.0–2.5)
Total CK: 127 U/L (ref 7–177)
Total CK: 134 U/L (ref 7–177)

## 2010-05-27 LAB — BASIC METABOLIC PANEL
CO2: 26 mEq/L (ref 19–32)
Calcium: 8.6 mg/dL (ref 8.4–10.5)
Chloride: 99 mEq/L (ref 96–112)
GFR calc Af Amer: 59 mL/min — ABNORMAL LOW (ref >60)
Potassium: 3.4 mEq/L — ABNORMAL LOW (ref 3.5–5.1)
Sodium: 138 mEq/L (ref 135–145)

## 2010-05-27 LAB — GLUCOSE, CAPILLARY: Glucose-Capillary: 287 mg/dL — ABNORMAL HIGH (ref 70–99)

## 2010-05-27 LAB — LIPID PANEL
Total CHOL/HDL Ratio: 3.3 RATIO
VLDL: 31 mg/dL (ref 0–40)

## 2010-05-27 LAB — POCT CARDIAC MARKERS: Troponin i, poc: <0.05 ng/mL (ref 0.00–0.09)

## 2010-05-27 NOTE — Medication Information (Signed)
Summary: Coumadin Clinic  Anticoagulant Therapy  Managed by: Cloyde Reams, RN, BSN PCP: Rockwell Alexandria Supervising MD: Excell Seltzer MD, Casimiro Needle Indication 1: Atrial Fibrillation (ICD-427.31) Lab Used: [facility] [place] Site: [address] PT 17.0 INR POC 1.9 INR RANGE 2 - 3    Bleeding/hemorrhagic complications: no     Any changes in medication regimen? yes       Details: Completed zpak today.     Any missed doses?: no         Allergies: 1)  ! Biaxin 2)  ! Cephalexin 3)  ! * Carbamazepine 4)  ! * Dicyclomine 5)  ! Sulfa 6)  ! Augmentin 7)  Cefaclor (Cefaclor) 8)  Dicyclomine Hcl (Dicyclomine Hcl) 9)  Sulfamethoxazole (Sulfamethoxazole)  Anticoagulation Management History:      Her anticoagulation is being managed by telephone today.  Positive risk factors for bleeding include an age of 75 years or older and presence of serious comorbidities.  The bleeding index is 'intermediate risk'.  Positive CHADS2 values include History of CHF, Age > 31 years old, and History of Diabetes.  The start date was 02/29/2008.  Prothrombin time is 17.0.  Anticoagulation responsible provider: Excell Seltzer MD, Casimiro Needle.  INR POC: 1.9.    Anticoagulation Management Assessment/Plan:      The patient's current anticoagulation dose is Warfarin sodium 5 mg tabs: Use as directed by Anticoagulation Clinic.  The target INR is 2 - 3.  The next INR is due 05/23/2009.  Anticoagulation instructions were given to home health nurse/Robin(daughter).  Results were reviewed/authorized by Cloyde Reams, RN, BSN.  She was notified by Cloyde Reams RN.         Prior Anticoagulation Instructions: INR 2.3  Called spoke with pt's daughter [name], advised to continue on same dosage 5mg  daily except 7.5mg  on Mondays and Thursdays.   Recheck in 3 weeks.  Called [facility] orders to redraw on 05/02/08 given to Ouida Sills, Red team.  Current Anticoagulation Instructions: INR 1.9  Spoke with [name], [facility] RN while at  pt's house.  Advised to have pt take and extra 1/2 tablet today then resume same dosage 1 tablet daily except 1.5 tablets on Mondays and Thursdays.  Recheck in 3 weeks.

## 2010-05-27 NOTE — Progress Notes (Signed)
Summary: coumadin question  Phone Note Call from Patient Call back at Home Phone [phone]   Caller: Daughter/robin Reason for Call: Talk to Nurse Summary of Call: request to speak to someone in the coumdain clinic about [facility] coming out to the home for PT/INR  Initial call taken by: Migdalia Dk,  August 05, 2009 11:47 AM  Follow-up for Phone Call        Called spoke with pt's daughter.  Pt was in [place] [facility] last week.  Discharged on 08/01/09 and has not heard from [facility] since discharged to resume care.  Called [facility] Red team spoke with Kriste Basque, they were not aware pt had been d/c from hospital.  Will go out tomorrow 08/06/09 to resume care and gave verbal order to check PT/INR while at pt's home tomorrow.   Called [name], advised [facility] would be coming out tomorrow to check PT/INR. Follow-up by: Cloyde Reams RN,  August 05, 2009 12:21 PM

## 2010-05-27 NOTE — Letter (Signed)
Summary: CMN for oxygen/[facility]  CMN for oxygen/[facility]   Imported By: Sherian Rein 01/29/2010 08:30:33  _____________________________________________________________________  External Attachment:    Type:   Image     Comment:   External Document

## 2010-05-27 NOTE — Medication Information (Signed)
Summary: Coumadin Clinic  Anticoagulant Therapy  Managed by: Weston Brass, PharmD Referring MD: Dr Jens Som PCP: Rockwell Alexandria Supervising MD: Eden Emms MD, Theron Arista Indication 1: Atrial Fibrillation (ICD-427.31) Lab Used: [facility] [place] Site: [address] PT 20.8 INR POC 3.0 INR RANGE 2 - 3  Dietary changes: no    Health status changes: no    Bleeding/hemorrhagic complications: no    Recent/future hospitalizations: no    Any changes in medication regimen? no    Recent/future dental: no  Any missed doses?: no       Is patient compliant with meds? yes       Allergies: 1)  ! Biaxin 2)  ! Cephalexin 3)  ! * Carbamazepine 4)  ! * Dicyclomine 5)  ! Sulfa 6)  ! Augmentin 7)  Cefaclor 8)  Dicyclomine Hcl (Dicyclomine Hcl) 9)  Sulfamethoxazole (Sulfamethoxazole)  Anticoagulation Management History:      Her anticoagulation is being managed by telephone today.  Positive risk factors for bleeding include an age of 49 years or older and presence of serious comorbidities.  The bleeding index is 'intermediate risk'.  Positive CHADS2 values include History of CHF, Age > 46 years old, and History of Diabetes.  The start date was 02/29/2008.  Prothrombin time is 20.8.  Anticoagulation responsible provider: Eden Emms MD, Theron Arista.  INR POC: 3.0.  Exp: 07/2010.    Anticoagulation Management Assessment/Plan:      The patient's current anticoagulation dose is Warfarin sodium 5 mg tabs: Use as directed by Anticoagulation Clinic.  The target INR is 2 - 3.  The next INR is due 09/10/2009.  Anticoagulation instructions were given to patient/daughter/HH Agency.  Results were reviewed/authorized by Weston Brass, PharmD.  She was notified by Weston Brass PharmD.         Prior Anticoagulation Instructions: INR 2.0 no problems 5mg  once daily except 7.5mg  Mon and Thur recheck 1 week  Current Anticoagulation Instructions: INR 3.0  Spoke with nurse with [facility].  Continue same dose of 1  tablet every day except 1 1/2 tablets on Monday and Thursday.  Recheck in 1 week.

## 2010-05-27 NOTE — Medication Information (Signed)
Summary: Coumadin Clinic  Anticoagulant Therapy  Managed by: Cloyde Reams, RN, BSN Referring MD: Dr Jens Som PCP: Rockwell Alexandria Supervising MD: Graciela Husbands MD, Viviann Spare Indication 1: Atrial Fibrillation (ICD-427.31) Lab Used: [facility] [place] Site: [address] PT 18.1 INR POC 2.2 INR RANGE 2 - 3    Bleeding/hemorrhagic complications: no     Any changes in medication regimen? no     Any missed doses?: no         Allergies: 1)  ! Biaxin 2)  ! Cephalexin 3)  ! * Carbamazepine 4)  ! * Dicyclomine 5)  ! Sulfa 6)  ! Augmentin 7)  Cefaclor 8)  Dicyclomine Hcl (Dicyclomine Hcl) 9)  Sulfamethoxazole (Sulfamethoxazole)  Anticoagulation Management History:      Her anticoagulation is being managed by telephone today.  Positive risk factors for bleeding include an age of 82 years or older and presence of serious comorbidities.  The bleeding index is 'intermediate risk'.  Positive CHADS2 values include History of CHF, Age > 30 years old, and History of Diabetes.  The start date was 02/29/2008.  Prothrombin time is 18.1.  Anticoagulation responsible provider: Graciela Husbands MD, Viviann Spare.  INR POC: 2.2.  Exp: 07/2010.    Anticoagulation Management Assessment/Plan:      The patient's current anticoagulation dose is Warfarin sodium 5 mg tabs: Use as directed by Anticoagulation Clinic.  The target INR is 2 - 3.  The next INR is due 11/12/2009.  Anticoagulation instructions were given to home health nurse.  Results were reviewed/authorized by Cloyde Reams, RN, BSN.  She was notified by Cloyde Reams RN.         Prior Anticoagulation Instructions: INR 2.0  Spoke with Kathie Rhodes with [facility].  Continue same dose of 1 tablet every day except 1 1/2 tablets on Monday, Wednesday and Friday.  Recheck INR in 1 week.   Current Anticoagulation Instructions: INR 2.2  Spoke with Kathie Rhodes, [facility] advised to have pt continue on same dosage  1 tablet daily except 1.5 tablets on Mondays, Wednesdays, and  Fridays.  Recheck in 2 weeks.

## 2010-05-27 NOTE — Medication Information (Signed)
Summary: Coumadin Clinic  Anticoagulant Therapy  Managed by: Cloyde Reams, RN, BSN Referring MD: Dr Jens Som PCP: Rockwell Alexandria Supervising MD: Excell Seltzer MD, Casimiro Needle Indication 1: Atrial Fibrillation (ICD-427.31) Lab Used: [facility] [place] Site: [address] PT 22.6 INR POC 3.5 INR RANGE 2 - 3    Bleeding/hemorrhagic complications: no     Any changes in medication regimen? no     Any missed doses?: no         Allergies: 1)  ! Biaxin 2)  ! Cephalexin 3)  ! * Carbamazepine 4)  ! * Dicyclomine 5)  ! Sulfa 6)  ! Augmentin 7)  Cefaclor 8)  Dicyclomine Hcl (Dicyclomine Hcl) 9)  Sulfamethoxazole (Sulfamethoxazole)  Anticoagulation Management History:      Her anticoagulation is being managed by telephone today.  Positive risk factors for bleeding include an age of 52 years or older and presence of serious comorbidities.  The bleeding index is 'intermediate risk'.  Positive CHADS2 values include History of CHF, Age > 15 years old, and History of Diabetes.  The start date was 02/29/2008.  Prothrombin time is 22.6.  Anticoagulation responsible provider: Excell Seltzer MD, Casimiro Needle.  INR POC: 3.5.  Exp: 07/2010.    Anticoagulation Management Assessment/Plan:      The patient's current anticoagulation dose is Warfarin sodium 5 mg tabs: Use as directed by Anticoagulation Clinic.  The target INR is 2 - 3.  The next INR is due 08/20/2009.  Anticoagulation instructions were given to patient/daughter/HH Agency.  Results were reviewed/authorized by Cloyde Reams, RN, BSN.  She was notified by Cloyde Reams RN.         Prior Anticoagulation Instructions: INR 1.4  Spoke with Kathie Rhodes, [facility], RN while at pt's home.  Pt took 7.5mg  on Tu,Th and 5mg  all other days.  Adivised to have pt take 7.5mg  tomorrow then start taking 5mg  once daily/7.5mg  TuThSa.  Recheck in 1 week.    Current Anticoagulation Instructions: INR 3.5  Spoke with Kathie Rhodes, [facility] RN while at pt's home.  Advised to have pt  hold x 1 dose then resume same dosage 1 tablet daily except 1.5 tablets on Tuesdays, Thursdays, and Saturdays.  Recheck in 1 week.

## 2010-05-27 NOTE — Cardiovascular Report (Signed)
Summary: Office Visit   Office Visit   Imported By: Roderic Ovens 03/12/2010 16:44:45  _____________________________________________________________________  External Attachment:    Type:   Image     Comment:   External Document

## 2010-05-27 NOTE — Medication Information (Signed)
Summary: Coumadin Clinic  Anticoagulant Therapy  Managed by: Cloyde Reams, RN, BSN Referring MD: Dr Jens Som PCP: Rockwell Alexandria Supervising MD: Excell Seltzer MD, Casimiro Needle Indication 1: Atrial Fibrillation (ICD-427.31) Lab Used: [facility] [place] Site: [address] PT 14.4 INR POC 1.4 INR RANGE 2 - 3    Bleeding/hemorrhagic complications: no    Recent/future hospitalizations: yes       Details: Discharged from [facility] on 08/01/09.    Any missed doses?: no        Comments: Pt's family member robin, states pt's INR was 1.0 when discharged from [facility] on 08/01[facility]09.  Allergies: 1)  ! Biaxin 2)  ! Cephalexin 3)  ! * Carbamazepine 4)  ! * Dicyclomine 5)  ! Sulfa 6)  ! Augmentin 7)  Cefaclor 8)  Dicyclomine Hcl (Dicyclomine Hcl) 9)  Sulfamethoxazole (Sulfamethoxazole)  Anticoagulation Management History:      Her anticoagulation is being managed by telephone today.  Positive risk factors for bleeding include an age of 74 years or older and presence of serious comorbidities.  The bleeding index is 'intermediate risk'.  Positive CHADS2 values include History of CHF, Age > 3 years old, and History of Diabetes.  The start date was 02/29/2008.  Prothrombin time is 14.4.  Anticoagulation responsible provider: Excell Seltzer MD, Casimiro Needle.  INR POC: 1.4.  Exp: 07/2010.    Anticoagulation Management Assessment/Plan:      The patient's current anticoagulation dose is Warfarin sodium 5 mg tabs: Use as directed by Anticoagulation Clinic.  The target INR is 2 - 3.  The next INR is due 08/13/2009.  Anticoagulation instructions were given to patient/daughter/HH Agency.  Results were reviewed/authorized by Cloyde Reams, RN, BSN.  She was notified by Cloyde Reams RN.         Prior Anticoagulation Instructions: INR 1.5  Spoke with Kathie Rhodes, [facility], RN while at pt's home.  Advised to give pt an extra 1/2 tablet today then start taking 5mg  daily except 7.5mg  on Mondays, Thursdays, and  Saturdays.  Recheck in 1 week.    Current Anticoagulation Instructions: INR 1.4  Spoke with Kathie Rhodes, [facility], RN while at pt's home.  Pt took 7.5mg  on Tu,Th and 5mg  all other days.  Adivised to have pt take 7.5mg  tomorrow then start taking 5mg  once daily/7.5mg  TuThSa.  Recheck in 1 week.

## 2010-05-27 NOTE — Medication Information (Signed)
Summary: Coumadin Clinic  Anticoagulant Therapy  Managed by: Bethena Midget, RN, BSN Referring MD: Dr Jens Som PCP: Rockwell Alexandria Supervising MD: Graciela Husbands MD, Viviann Spare Indication 1: Atrial Fibrillation (ICD-427.31) Lab Used: [facility] [place] Site: [address] PT 14.1 INR POC 1.3 INR RANGE 2 - 3  Dietary changes: no    Health status changes: no    Bleeding/hemorrhagic complications: no    Recent/future hospitalizations: no    Any changes in medication regimen? no    Recent/future dental: no  Any missed doses?: yes     Details: missed a dose since last INR  Is patient compliant with meds? yes      Comments: Per [facility] nurse  Allergies: 1)  ! Biaxin 2)  ! Cephalexin 3)  ! * Carbamazepine 4)  ! * Dicyclomine 5)  ! Sulfa 6)  ! Augmentin 7)  Cefaclor 8)  Dicyclomine Hcl (Dicyclomine Hcl) 9)  Sulfamethoxazole (Sulfamethoxazole)  Anticoagulation Management History:      Her anticoagulation is being managed by telephone today.  Positive risk factors for bleeding include an age of 18 years or older and presence of serious comorbidities.  The bleeding index is 'intermediate risk'.  Positive CHADS2 values include History of CHF, Age > 37 years old, and History of Diabetes.  The start date was 02/29/2008.  Prothrombin time is 14.1.  Anticoagulation responsible provider: Graciela Husbands MD, Viviann Spare.  INR POC: 1.3.    Anticoagulation Management Assessment/Plan:      The patient's current anticoagulation dose is Warfarin sodium 5 mg tabs: Use as directed by Anticoagulation Clinic.  The target INR is 2 - 3.  The next INR is due 09/17/2009.  Anticoagulation instructions were given to patient/daughter/HH Agency.  Results were reviewed/authorized by Bethena Midget, RN, BSN.  She was notified by Bethena Midget, RN, BSN.         Prior Anticoagulation Instructions: INR 3.0  Spoke with nurse with [facility].  Continue same dose of 1 tablet every day except 1 1/2 tablets on Monday and Thursday.   Recheck in 1 week.   Current Anticoagulation Instructions: INR 1.3 Today take 7.5mg s then resume 5mg s daliy except 7.5mg s on MOndays and Thursdays. Recheck INR in one week. Orders given to [facility] with [facility].

## 2010-05-27 NOTE — Progress Notes (Signed)
Summary: recert for another 60 days  Phone Note From Other Clinic   Caller: betty from [facility] [phone] Request: Talk with Nurse Summary of Call: Time recret pt again for another 60 days.  Initial call taken by: Lorne Skeens,  December 20, 2009 3:24 PM  Follow-up for Phone Call        Kathie Rhodes will mail out the precert form for Dr. Jens Som to sign. I will forward this to his nurse. Mylo Red RN

## 2010-05-27 NOTE — Assessment & Plan Note (Signed)
Summary: rov/follow up/dm   Primary Provider:  S. Campbell/    History of Present Illness: [name] is a female who has a history of coronary artery disease status post coronary bypassing graft performed in 2000. Her last Myoview was performed in the hospital on April 29, 2007, and showed no ischemia with an ejection fraction of 43%.  Last echocardiogram in February 2010 showed normal LV function. There was mild left atrial and right ventricular enlargement. She also has a history of permanent atrial fibrillation and has had prior AV node ablation.  There was also a question of prior von Willebrand disease.  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 Since she was last seen she does have some dyspnea on exertion but has significant lung disease. There is no orthopnea, PND, pedal edema, palpitations, syncope, chest pain or bleeding.  Current Medications (verified): 1)  Lantus 100 Unit/ml  Soln (Insulin Glargine) .... 32 Units Daily in The Morning 2)  Humalog 100 Unit/ml  Soln (Insulin Lispro (Human)) .... Give 10 Units Subcutaneously Three Times A Day If Blood Sugar Less Than 225.  Give 12 Units Subcutaneously Three Times Daily If Blood Sugar 225 or Higher. 3)  Synthroid 200 Mcg  Tabs (Levothyroxine Sodium) .... Take 1 Tablet By Mouth Once A Day 4)  Nitroquick 0.4 Mg  Subl (Nitroglycerin) .... As Needed 5)  Mucinex 600 Mg  Tb12 (Guaifenesin) .... As Needed 6)  Xopenex Hfa 45 Mcg/act Aero (Levalbuterol Tartrate) .... Inhale 2 Puff Using Inhaler Four Times A Day 7)  Furosemide 40 Mg Tabs (Furosemide) .... Take One Tablet By Mouth Daily. 8)  Metoprolol Succinate 100 Mg Xr24h-Tab (Metoprolol Succinate) .... 1/2 Tab Once Daily 9)  Potassium Chloride Cr 10 Meq Cr-Caps (Potassium Chloride) .... Take One Tablet By Mouth Daily 10)  Warfarin Sodium 5 Mg Tabs (Warfarin Sodium) .... Use As Directed By  Anticoagulation Clinic 11)  Protonix 40 Mg Tbec (Pantoprazole Sodium) .... One Tablet By Mouth Daily 12)  Zoloft 100 Mg Tabs (Sertraline Hcl) .Marland Kitchen.. 1 Tab By Mouth Daily 13)  Cobal-1000 1000 Mcg/ml Soln (Cyanocobalamin) .Marland Kitchen.. 1 Mg Subcutaneously Twice A Month For Anemia. 14)  Symbicort 160-4.5 Mcg/act Aero (Budesonide-Formoterol Fumarate) .Marland Kitchen.. 1 Inhalation By Mouth Two Times A Day. Rinse Mouth After Use. 15)  Xopenex  Nebu (Levalbuterol Hcl) .... Uad 16)  Oxygen 2 L/m Cont/port Advanced  Allergies: 1)  ! Biaxin 2)  ! Cephalexin 3)  ! * Carbamazepine 4)  ! * Dicyclomine 5)  ! Sulfa 6)  ! Augmentin 7)  Cefaclor 8)  Dicyclomine Hcl 9)  Sulfamethoxazole (Sulfamethoxazole)  Past History:  Past Medical History: CHF (ICD-428.0) CARDIAC PACEMAKER IN SITU (ICD-V45.01) ANEMIA, PERNICIOUS (ICD-281.0) ATRIAL FIBRILLATION (ICD-427.31); Status post AV node ablation. VON [employer] DISEASE (ICD-286.4) GALLBLADDER DISEASE (ICD-575.9) HYPOTHYROIDISM (ICD-244.9) DM (ICD-250.00) CORONARY ARTERY DISEASE (ICD-414.00) Hx of ASTHMATIC BRONCHITIS, ACUTE (ICD-466.0) C O P D (ICD-496)  Past Surgical History: Reviewed history from 06/17/2007 and no changes required. CABG mandibular reconstruction with bone graft from ribs appendectomy hernia repair  Social History: Reviewed history from 07/17/2008 and no changes required. Patient states former smoker.  Lives with daughter  Review of Systems       no fevers or chills, productive cough, hemoptysis, dysphasia, odynophagia, melena, hematochezia, dysuria, hematuria, rash, seizure activity, orthopnea, PND, pedal edema, claudication. Remaining systems are negative.   Vital Signs:  Patient profile:   77  year old female Height:      65 inches Weight:      209 pounds BMI:     34.91 Pulse rate:   70 / minute Resp:     14 per minute BP sitting:   130 / 70  (left arm)  Vitals Entered By: Kem Parkinson (February 27, 2010 2:02 PM)  Physical  Exam  General:  Well-developed well-nourished in no acute distress.  Skin is warm and dry.  HEENT is normal.  Neck is supple. No thyromegaly.  Chest is clear to auscultation with normal expansion.  Cardiovascular exam is regular rate and rhythm. 2/6 systolic murmur left sternal border.  Abdominal exam nontender or distended. No masses palpated. Extremities show no edema. neuro grossly intact    EKG  Procedure date:  02/27/2010  Findings:      Ventricular pacing with underlying atrial fibrillation.  PPM Specifications Following MD:  Lewayne Bunting, MD     PPM Vendor:  [employer]     PPM Model Number:  [id number]     PPM Serial Number:  [id number] PPM DOI:  05/29/2008      Lead 1    Location: RV     DOI: 05/29/2008     Model #: [id number]     Serial #: [id number]     Status: active Lead 2    Location: LV     DOI: 05/29/2008     Model #: [id number]     Serial #: [id number]     Status: abandoned  Magnet Response Rate:  BOL 100 ERI  85  Indications:  ICM; A-fib  AV node alblation  Explantation Comments:  LV lead off  PPM Follow Up Pacer Dependent:  Yes      Episodes Coumadin:  Yes  Parameters Mode:  VVIR     Lower Rate Limit:  70     Upper Rate Limit:  110  Impression & Recommendations:  Problem # 1:  CARDIAC PACEMAKER IN SITU (ICD-V45.01) Magement per electrophysiology.  Problem # 2:  CORONARY ARTERY DISEASE (ICD-414.00) Continue beta blocker. Not on aspirin as she is on Coumadin and I am concerned about excess bleeding and potential fall risk. Intolerant to statins. Her updated medication list for this problem includes:    Nitroquick 0.4 Mg Subl (Nitroglycerin) .Marland Kitchen... As needed    Metoprolol Succinate 100 Mg Xr24h-tab (Metoprolol succinate) .Marland Kitchen... 1/2 tab once daily    Warfarin Sodium 5 Mg Tabs (Warfarin sodium) ..... Use as directed by anticoagulation clinic  Her updated medication list for this problem includes:    Nitroquick 0.4 Mg Subl (Nitroglycerin) .Marland Kitchen... As needed    Metoprolol  Succinate 100 Mg Xr24h-tab (Metoprolol succinate) .Marland Kitchen... 1/2 tab once daily    Warfarin Sodium 5 Mg Tabs (Warfarin sodium) ..... Use as directed by anticoagulation clinic  Problem # 3:  PAROXYSMAL ATRIAL FIBRILLATION (ICD-427.31)  Patient in permanent atrial fibrillation. Previous left atrial appendage thrombus. Continue Coumadin. Her updated medication list for this problem includes:    Metoprolol Succinate 100 Mg Xr24h-tab (Metoprolol succinate) .Marland Kitchen... 1/2 tab once daily    Warfarin Sodium 5 Mg Tabs (Warfarin sodium) ..... Use as directed by anticoagulation clinic  Orders: EKG w/ Interpretation (93000) TLB-BMP (Basic Metabolic Panel-BMET) (80048-METABOL)  Her updated medication list for this problem includes:    Metoprolol Succinate 100 Mg Xr24h-tab (Metoprolol succinate) .Marland Kitchen... 1/2 tab once daily    Warfarin Sodium 5 Mg Tabs (Warfarin sodium) ..... Use as directed by anticoagulation clinic  Problem #  4:  HYPOTHYROIDISM (ICD-244.9)  Her updated medication list for this problem includes:    Synthroid 200 Mcg Tabs (Levothyroxine sodium) .Marland Kitchen... Take 1 tablet by mouth once a day  Her updated medication list for this problem includes:    Synthroid 200 Mcg Tabs (Levothyroxine sodium) .Marland Kitchen... Take 1 tablet by mouth once a day  Problem # 5:  DM (ICD-250.00)  Her updated medication list for this problem includes:    Lantus 100 Unit/ml Soln (Insulin glargine) .Marland Kitchen... 32 units daily in the morning    Humalog 100 Unit/ml Soln (Insulin lispro (human)) .Marland Kitchen... Give 10 units subcutaneously three times a day if blood sugar less than 225.  give 12 units subcutaneously three times daily if blood sugar 225 or higher.  Her updated medication list for this problem includes:    Lantus 100 Unit/ml Soln (Insulin glargine) .Marland Kitchen... 32 units daily in the morning    Humalog 100 Unit/ml Soln (Insulin lispro (human)) .Marland Kitchen... Give 10 units subcutaneously three times a day if blood sugar less than 225.  give 12 units  subcutaneously three times daily if blood sugar 225 or higher.  Problem # 6:  C O P D (ICD-496)  Her updated medication list for this problem includes:    Xopenex Hfa 45 Mcg/act Aero (Levalbuterol tartrate) ..... Inhale 2 puff using inhaler four times a day    Symbicort 160-4.5 Mcg/act Aero (Budesonide-formoterol fumarate) .Marland Kitchen... 1 inhalation by mouth two times a day. rinse mouth after use.  Her updated medication list for this problem includes:    Xopenex Hfa 45 Mcg/act Aero (Levalbuterol tartrate) ..... Inhale 2 puff using inhaler four times a day    Symbicort 160-4.5 Mcg/act Aero (Budesonide-formoterol fumarate) .Marland Kitchen... 1 inhalation by mouth two times a day. rinse mouth after use.  Problem # 7:  COUMADIN THERAPY (ICD-V58.61) Goal INR 2-3. Check CBC.  Problem # 8:  ESSENTIAL HYPERTENSION, BENIGN (ICD-401.1)  Continue present medications. Check potassium and renal function. Her updated medication list for this problem includes:    Furosemide 40 Mg Tabs (Furosemide) .Marland Kitchen... Take one tablet by mouth daily.    Metoprolol Succinate 100 Mg Xr24h-tab (Metoprolol succinate) .Marland Kitchen... 1/2 tab once daily  Her updated medication list for this problem includes:    Furosemide 40 Mg Tabs (Furosemide) .Marland Kitchen... Take one tablet by mouth daily.    Metoprolol Succinate 100 Mg Xr24h-tab (Metoprolol succinate) .Marland Kitchen... 1/2 tab once daily  Other Orders: TLB-CBC Platelet - w/Differential (85025-CBCD)  Patient Instructions: 1)  Your physician recommends that you schedule a follow-up appointment in: 3 MONTHS WITH DR Ladona Ridgel  AND 6 MONTHS DR Jens Som 2)  Your physician recommends that you return for lab work ZO:XWRUE BMET CBC 3)  Your physician recommends that you continue on your current medications as directed. Please refer to the Current Medication list given to you today.

## 2010-05-27 NOTE — Medication Information (Signed)
Summary: Coumadin Clinic  Anticoagulant Therapy  Managed by: Bethena Midget, RN, BSN Referring MD: Dr Jens Som PCP: [facility] Supervising MD: Myrtis Ser MD, Tinnie Gens Indication 1: Atrial Fibrillation (ICD-427.31) Lab Used: [facility] [facility] [place] Site: [address] PT 37.1 INR POC 3.1 INR RANGE 2 - 3  Dietary changes: no    Health status changes: no    Bleeding/hemorrhagic complications: no    Recent/future hospitalizations: no    Any changes in medication regimen? no    Recent/future dental: no  Any missed doses?: no       Is patient compliant with meds? yes       Allergies: 1)  ! Biaxin 2)  ! Cephalexin 3)  ! * Carbamazepine 4)  ! * Dicyclomine 5)  ! Sulfa 6)  ! Augmentin 7)  Cefaclor 8)  Dicyclomine Hcl (Dicyclomine Hcl) 9)  Sulfamethoxazole (Sulfamethoxazole)  Anticoagulation Management History:      Her anticoagulation is being managed by telephone today.  Positive risk factors for bleeding include an age of 75 years or older and presence of serious comorbidities.  The bleeding index is 'intermediate risk'.  Positive CHADS2 values include History of CHF, Age > 75 years old, and History of Diabetes.  The start date was 02/29/2008.  Prothrombin time is 37.1.  Anticoagulation responsible provider: Myrtis Ser MD, Tinnie Gens.  INR POC: 3.1.    Anticoagulation Management Assessment/Plan:      The patient's current anticoagulation dose is Warfarin sodium 5 mg tabs: Use as directed by Anticoagulation Clinic.  The target INR is 2 - 3.  The next INR is due 12/24/2009.  Anticoagulation instructions were given to home health nurse.  Results were reviewed/authorized by Bethena Midget, RN, BSN.  She was notified by Bethena Midget, RN, BSN.         Prior Anticoagulation Instructions: INR 2.3  Spoke with Kathie Rhodes, [facility] RN advised to continue on same dosage 1 tablet daily except 1.5 tablets on Mondays, Wednesdays, and Fridays.  Recheck in 2 weeks.    Current Anticoagulation  Instructions: INR 3.1 Skip tomorrow's dose. Then resume 5mg s daily except 7.5mg s on MWF. Recheck in 2 weeks. Orders given to [facility] with [facility] while at home.

## 2010-05-27 NOTE — Medication Information (Signed)
Summary: Coumadin Clinic  Anticoagulant Therapy  Managed by: Weston Brass, PharmD Referring MD: Dr Jens Som PCP: [facility] Supervising MD: Jens Som MD, Arlys John Indication 1: Atrial Fibrillation (ICD-427.31) Lab Used: [facility] [place] Site: [address] PT 17.3 INR POC 2.0 INR RANGE 2 - 3  Dietary changes: no    Health status changes: no    Bleeding/hemorrhagic complications: no    Recent/future hospitalizations: no    Any changes in medication regimen? no    Recent/future dental: no  Any missed doses?: no       Is patient compliant with meds? yes      Comments: Kathie Rhodes states pt's home is "chaotic".  Unsure what Coumadin patient is actually getting.  She continues to re-inforce importance of compliance with medications.   Allergies: 1)  ! Biaxin 2)  ! Cephalexin 3)  ! * Carbamazepine 4)  ! * Dicyclomine 5)  ! Sulfa 6)  ! Augmentin 7)  Cefaclor 8)  Dicyclomine Hcl (Dicyclomine Hcl) 9)  Sulfamethoxazole (Sulfamethoxazole)  Anticoagulation Management History:      Her anticoagulation is being managed by telephone today.  Positive risk factors for bleeding include an age of 75 years or older and presence of serious comorbidities.  The bleeding index is 'intermediate risk'.  Positive CHADS2 values include History of CHF, Age > 75 years old, and History of Diabetes.  The start date was 02/29/2008.  Prothrombin time is 17.3.  Anticoagulation responsible provider: Jens Som MD, Arlys John.  INR POC: 2.0.  Exp: 07/2010.    Anticoagulation Management Assessment/Plan:      The patient's current anticoagulation dose is Warfarin sodium 5 mg tabs: Use as directed by Anticoagulation Clinic.  The target INR is 2 - 3.  The next INR is due 10/22/2009.  Anticoagulation instructions were given to home health nurse.  Results were reviewed/authorized by Weston Brass, PharmD.         Prior Anticoagulation Instructions: INR 1.7  Spoke with Kathie Rhodes with [facility] while in pts home.  Pt  already taken 7.5mg  today.  Make sure pt takes current dose of 5mg  daily except 7.5mg  on Monday, Wednesday and Friday.  Recheck in 1 week.   Current Anticoagulation Instructions: INR 2.0  Spoke with Kathie Rhodes with [facility].  Continue same dose of 1 tablet every day except 1 1/2 tablets on Monday, Wednesday and Friday.  Recheck INR in 1 week.

## 2010-05-27 NOTE — Medication Information (Signed)
Summary: Coumadin Clinic  Anticoagulant Therapy  Managed by: Cloyde Reams, RN, BSN Referring MD: Dr Jens Som PCP: [facility] Supervising MD: Juanda Chance MD, Davontay Watlington Indication 1: Atrial Fibrillation (ICD-427.31) Lab Used: [facility] [facility]  Site: [address] PT 29.4 INR POC 2.9 INR RANGE 2 - 3    Bleeding/hemorrhagic complications: no     Any changes in medication regimen? no     Any missed doses?: no         Allergies: 1)  ! Biaxin 2)  ! Cephalexin 3)  ! * Carbamazepine 4)  ! * Dicyclomine 5)  ! Sulfa 6)  ! Augmentin 7)  Cefaclor (Cefaclor) 8)  Dicyclomine Hcl (Dicyclomine Hcl) 9)  Sulfamethoxazole (Sulfamethoxazole)  Anticoagulation Management History:      Positive risk factors for bleeding include an age of 64 years or older and presence of serious comorbidities.  The bleeding index is 'intermediate risk'.  Positive CHADS2 values include History of CHF, Age > 42 years old, and History of Diabetes.  The start date was 02/29/2008.  Prothrombin time is 29.4.  Anticoagulation responsible provider: Juanda Chance MD, Smitty Cords.  INR POC: 2.9.    Anticoagulation Management Assessment/Plan:      The patient's current anticoagulation dose is Warfarin sodium 5 mg tabs: Use as directed by Anticoagulation Clinic.  The target INR is 2 - 3.  The next INR is due 06/19/2009.  Anticoagulation instructions were given to home health nurse/Robin(daughter).  Results were reviewed/authorized by Cloyde Reams, RN, BSN.  She was notified by Cloyde Reams RN.         Prior Anticoagulation Instructions: INR 1.9  Spoke with [name], [facility] RN while at pt's house.  Advised to have pt take and extra 1/2 tablet today then resume same dosage 1 tablet daily except 1.5 tablets on Mondays and Thursdays.  Recheck in 3 weeks.    Current Anticoagulation Instructions: INR 2.9  Spoke with [facility] RN, advised to continue pt on same dosage 1 tablet daily except 1.5 tablets on Mondays and Thursdays.   Recheck in 4 weeks.

## 2010-05-27 NOTE — Cardiovascular Report (Signed)
Summary: Office Visit   Office Visit   Imported By: Roderic Ovens 06/27/2009 13:56:52  _____________________________________________________________________  External Attachment:    Type:   Image     Comment:   External Document

## 2010-05-27 NOTE — Letter (Signed)
Summary: Device-Delinquent Check  [facility], Main Office  [address]   [place], [place] [address]   Phone: [phone]  Fax: [phone]     November 21, 2009 MRN: [id number]   [name] [address] [place], [place]  [address]   Dear [name],  According to our records, you have not had your implanted device checked in the recommended period of time.  We are unable to determine appropriate device function without checking your device on a regular basis.  Please call our office to schedule an appointment as soon as possible.  If you are having your device checked by another physician, please call us so that we may update our records.  Thank you,   [facility]

## 2010-05-27 NOTE — Medication Information (Signed)
Summary: Coumadin Clinic  Anticoagulant Therapy  Managed by: Weston Brass, PharmD Referring MD: Dr Jens Som PCP: [facility] Supervising MD: Shirlee Latch MD, Freida Busman Indication 1: Atrial Fibrillation (ICD-427.31) Lab Used: [facility] [place] Site: [address] PT 19.2 INR POC 1.6 INR RANGE 2 - 3  Dietary changes: no    Health status changes: no    Bleeding/hemorrhagic complications: no    Recent/future hospitalizations: no    Any changes in medication regimen? no    Recent/future dental: no  Any missed doses?: no       Is patient compliant with meds? yes       Allergies: 1)  ! Biaxin 2)  ! Cephalexin 3)  ! * Carbamazepine 4)  ! * Dicyclomine 5)  ! Sulfa 6)  ! Augmentin 7)  Cefaclor 8)  Dicyclomine Hcl (Dicyclomine Hcl) 9)  Sulfamethoxazole (Sulfamethoxazole)  Anticoagulation Management History:      Her anticoagulation is being managed by telephone today.  Positive risk factors for bleeding include an age of 75 years or older and presence of serious comorbidities.  The bleeding index is 'intermediate risk'.  Positive CHADS2 values include History of CHF, Age > 13 years old, and History of Diabetes.  The start date was 02/29/2008.  Prothrombin time is 19.2.  Anticoagulation responsible provider: Shirlee Latch MD, Caryle Helgeson.  INR POC: 1.6.  Exp: 07/2010.    Anticoagulation Management Assessment/Plan:      The patient's current anticoagulation dose is Warfarin sodium 5 mg tabs: Use as directed by Anticoagulation Clinic.  The target INR is 2 - 3.  The next INR is due 11/26/2009.  Anticoagulation instructions were given to home health nurse.  Results were reviewed/authorized by Weston Brass, PharmD.  She was notified by Weston Brass PharmD.         Prior Anticoagulation Instructions: INR 2.2  Spoke with Kathie Rhodes, [facility] advised to have pt continue on same dosage  1 tablet daily except 1.5 tablets on Mondays, Wednesdays, and Fridays.  Recheck in 2 weeks.   Current  Anticoagulation Instructions: INR 1.6  Spoke with Kathie Rhodes, RN with [facility] while in pt's home.  Take 10mg  today, 7.5mg  tomorrow then resume same dose of 5mg  daily except 7.5mg  on Monday, Wednesday and Friday.  Recheck INR in 2 weeks.

## 2010-05-27 NOTE — Medication Information (Signed)
Summary: Coumadin Clinic  Anticoagulant Therapy  Managed by: Cloyde Reams, RN, BSN Referring MD: Dr Jens Som PCP: [facility] Supervising MD: Johney Frame MD, Fayrene Fearing Indication 1: Atrial Fibrillation (ICD-427.31) Lab Used: [facility] [place] Site: [address] PT 17.3 INR POC 2.0 INR RANGE 2 - 3    Bleeding/hemorrhagic complications: no     Any changes in medication regimen? no     Any missed doses?: no         Allergies: 1)  ! Biaxin 2)  ! Cephalexin 3)  ! * Carbamazepine 4)  ! * Dicyclomine 5)  ! Sulfa 6)  ! Augmentin 7)  Cefaclor 8)  Dicyclomine Hcl (Dicyclomine Hcl) 9)  Sulfamethoxazole (Sulfamethoxazole)  Anticoagulation Management History:      Her anticoagulation is being managed by telephone today.  Positive risk factors for bleeding include an age of 75 years or older and presence of serious comorbidities.  The bleeding index is 'intermediate risk'.  Positive CHADS2 values include History of CHF, Age > 30 years old, and History of Diabetes.  The start date was 02/29/2008.  Prothrombin time is 17.3.  Anticoagulation responsible provider: Ardell Makarewicz MD, Fayrene Fearing.  INR POC: 2.0.  Exp: 07/2010.    Anticoagulation Management Assessment/Plan:      The patient's current anticoagulation dose is Warfarin sodium 5 mg tabs: Use as directed by Anticoagulation Clinic.  The target INR is 2 - 3.  The next INR is due 07/16/2009.  Anticoagulation instructions were given to patient/daughter/[facility].  Results were reviewed/authorized by Cloyde Reams, RN, BSN.  She was notified by Cloyde Reams RN.         Prior Anticoagulation Instructions: INR 2.2 [facility] to recheck INR next Thur  Continie same dose 1 tab once daily except 1 and 1/2 tab Mon, Thur  Current Anticoagulation Instructions: INR 2.0  Spoke with Kathie Rhodes, [facility], RN while at pt's home.  Advised to have pt continue on same dosage of coumadin 1 tablet daily except 1.5 tablets on Mondays and Thursdays.  Recheck PT/INR  on 07/16/09.

## 2010-05-27 NOTE — Assessment & Plan Note (Signed)
Summary: rov/ mbw   Primary Provider/Referring Provider:  S. Campbell/ Coco  CC:  follow up visit-COPD; "can walk about 10 ft and gives out of breathe" has to put O2 on..  History of Present Illness:  11/07/07- 77 year old woman with asthmatic bronchitis, and paroxysmal atrial fibrillation complicated by noncompliance with therapeutic recommendation.  She comes now report in one week of increased cough at night.  Says she has been sick and staying in bed.  She denies fever or sore throat, and says thick mucus.  Has been white or yellow.  She wants to avoid any new medications because she "takes so much".  She denies palpitation, chest pain, blood, GI or GU upset.  3////23/10- COPD,CAD, CHF, PAF                     Daughter is here She has been in and out of hospital repeatedly with dyspnea, CHF since last here. Did end up getting a pacemaker. We reviewed her d/c summary from 06/19/08. Has oxygen 2L used as needed- [facility]. Today she feels "tired" saying thyroid dose was reduced- to f/u with Dr Lucianne Muss. Otherwise has felt much better since she got the pacemaker. Denies pain or infection. Coughs little. Needs refill Atrovent inhaler.  January 09, 2010- COPD, CAD, CHF, PAF/ pacemaker.......................daughter here They describe oxygen from [facility] and say if walking at home she desats on room air to 86%, but will remain > 90% in wheelchair. She gives out of breath walking across a room. She stays on oxygen on guidance from her new primary doctor Cambell in [place]. The rolling portable she has is too heavy to maneuver when they have her in wheelchair. Daily cough and wheeze. Uses rescue inhaler at least once daily and once during night. Occasional pale green, nonbloody sputum.    Asthma History    Initial Asthma Severity Rating:    Age range: 12+ years    Symptoms: daily    Nighttime Awakenings: often 7/week    Interferes w/ normal activity: some limitations    SABA use (not for  EIB): daily    Asthma Severity Assessment: Severe Persistent   Preventive Screening-Counseling & Management  Alcohol-Tobacco     Smoking Status: quit     Year Quit: 1987     Pack years: 10 years  1/2 pack daily  Current Medications (verified): 1)  Lantus 100 Unit/ml  Soln (Insulin Glargine) .... 32 Units Daily in The Morning 2)  Humalog 100 Unit/ml  Soln (Insulin Lispro (Human)) .... Give 10 Units Subcutaneously Three Times A Day If Blood Sugar Less Than 225.  Give 12 Units Subcutaneously Three Times Daily If Blood Sugar 225 or Higher. 3)  Synthroid 200 Mcg  Tabs (Levothyroxine Sodium) .... Take 1 Tablet By Mouth Once A Day 4)  Nitroquick 0.4 Mg  Subl (Nitroglycerin) .... As Needed 5)  Mucinex 600 Mg  Tb12 (Guaifenesin) .... As Needed 6)  Xopenex Hfa 45 Mcg/act Aero (Levalbuterol Tartrate) .... Inhale 2 Puff Using Inhaler Four Times A Day 7)  Furosemide 40 Mg Tabs (Furosemide) .... Take One Tablet By Mouth Daily. 8)  Metoprolol Succinate 100 Mg Xr24h-Tab (Metoprolol Succinate) .... Take One Tablet By Mouth Daily 9)  Potassium Chloride Cr 10 Meq Cr-Caps (Potassium Chloride) .... Take One Tablet By Mouth Daily 10)  Warfarin Sodium 5 Mg Tabs (Warfarin Sodium) .... Use As Directed By Anticoagulation Clinic 11)  Protonix 40 Mg Tbec (Pantoprazole Sodium) .... One Tablet By Mouth Daily 12)  Zoloft  100 Mg Tabs (Sertraline Hcl) .Marland Kitchen.. 1 Tab By Mouth Daily 13)  Exelon 4.6 Mg/24hr Pt24 (Rivastigmine) .... Apply 1 Patch To Skin Daily.  Rotate Sites, and Remove Previous Day's Patch Prior To Applying Next One. 14)  Cobal-1000 1000 Mcg/ml Soln (Cyanocobalamin) .Marland Kitchen.. 1 Mg Subcutaneously Twice A Month For Anemia. 15)  Symbicort 160-4.5 Mcg/act Aero (Budesonide-Formoterol Fumarate) .Marland Kitchen.. 1 Inhalation By Mouth Two Times A Day. Rinse Mouth After Use. 16)  Xopenex  Nebu (Levalbuterol Hcl) .... Uad  Allergies (verified): 1)  ! Biaxin 2)  ! Cephalexin 3)  ! * Carbamazepine 4)  ! * Dicyclomine 5)  ! Sulfa 6)   ! 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  Lives with daughter  Risk Factors: Smoking Status: quit (01/09/2010)  Review of Systems      See HPI       The patient complains of shortness of breath with activity, shortness of breath at rest, and productive cough.  The patient denies non-productive cough, coughing up blood, chest pain, irregular heartbeats, acid heartburn, indigestion, loss of appetite, weight change, abdominal pain, difficulty swallowing, sore throat, tooth/dental problems, headaches, nasal congestion/difficulty breathing through nose, sneezing, itching, ear ache, hand/feet swelling, and rash.    Vital Signs:  Patient profile:   77 year old female Height:      65.5 inches Weight:      208.25 pounds BMI:     34.25 O2 Sat:      96 % on Room air Pulse rate:   73 / minute BP sitting:   122 / 60  (right arm) Cuff size:   large  Vitals Entered By: Reynaldo Minium CMA (January 09, 2010 8:58 AM)  O2 Flow:  Room air CC: follow up visit-COPD; "can walk about 10 ft and gives out of breathe" has to put O2 on.   Physical Exam  Additional Exam:  General: A/Ox3; pleasant and cooperative, NAD, alert, wheelchair SKIN: no rash,  lesions. Tatoo right ankle NODES: no lymphadenopathy HEENT: Bantry/AT, EOM- WNL, Conjuctivae- clear, PERRLA, TM-WNL, Nose- clear, Throat- clear and wnl, dentures, Mallampati  II NECK: Supple w/ fair ROM, JVD- none, normal carotid impulses w/o bruits Thyroid- CHEST: Clear to P&A, loose rattle right mid back cleared easily with minimal cough HEART: RRR, no m/g/r heard  ( Paced) ABDOMEN: obese ZOX:WRUE, nl pulses, no edema  NEURO: Grossly intact to observation, yawning, resting tremor hands      Impression & Recommendations:  Problem # 1:  C O P D (ICD-496) Oxygen dependent at least for sleep and exertion, due to heart and lung disease. I don't wonder that her daughter can't manage mother in wheelchair with a rolling O2 tank. We will ask for reassessment of her portable system.  Some wheeze each day despite Symbicort, but hx that she failed to see benefit from Spiriva. i will watch her for now. Flu vax discussion  Problem # 2:  CHF (ICD-428.0) CHF has  been an important additional basis for dyspnea at times. Cardiology has worked hard with her on this. Her updated medication list for this problem includes:    Furosemide 40 Mg Tabs (Furosemide) .Marland Kitchen... Take one tablet by mouth daily.    Metoprolol Succinate 100 Mg Xr24h-tab (Metoprolol succinate) .Marland Kitchen... Take one tablet by mouth daily    Warfarin Sodium 5 Mg Tabs (Warfarin sodium) ..... Use as directed by anticoagulation clinic  Medications Added to Medication List This Visit: 1)  Oxygen 2 L/m Cont/port [facility]   Other Orders: Est. Patient Level IV ([address]) Flu Vaccine 13yrs + MEDICARE PATIENTS (U0454) Administration Flu vaccine - MCR (U9811) DME Referral (DME)  Patient Instructions: 1)  Flu vax 2)  We will contact [facility] to assess you for a lighter portable oxygen system 3)  Please schedule a follow-up appointment in 1 year.      Flu Vaccine Consent Questions     Do you have a history of severe allergic reactions to this vaccine?  no    Any prior history of allergic reactions to egg and/or gelatin? no    Do you have a sensitivity to the preservative Thimersol? no    Do you have a past history of Guillan-Barre Syndrome? no    Do you currently have an acute febrile illness? no    Have you ever had a severe reaction to latex? no    Vaccine information given and explained to patient? yes    Are you currently pregnant? no    Lot Number:[id number]   Exp Date:10/25/2010   Site Given  Right Deltoid IMflu  Elray Buba RN  January 09, 2010 9:36 AM

## 2010-05-27 NOTE — Letter (Signed)
Summary: Remote Device Check  [facility], Main Office  [address]   [place], [place] [address]   Phone: [phone]  Fax: [phone]     December 13, 2009 MRN: [id number]   YVONE SLAPE [address] [place], [place]  [address]   Dear Ms. Drakeford,   Your remote transmission was recieved and reviewed by your physician.  All diagnostics were within normal limits for you.  __X___Your next transmission is scheduled for: 03-06-2010.  Please transmit at any time this day.  If you have a wireless device your transmission will be sent automatically.    Sincerely,  Vella Kohler

## 2010-05-27 NOTE — Cardiovascular Report (Signed)
Summary: Office Visit Remote   Office Visit Remote   Imported By: Roderic Ovens 12/16/2009 13:19:58  _____________________________________________________________________  External Attachment:    Type:   Image     Comment:   External Document

## 2010-05-27 NOTE — Medication Information (Signed)
Summary: Coumadin Clinic  Anticoagulant Therapy  Managed by: Cloyde Reams, RN, BSN Referring MD: Dr Jens Som PCP: Rockwell Alexandria Supervising MD: Eden Emms MD, Theron Arista Indication 1: Atrial Fibrillation (ICD-427.31) Lab Used: [facility] [place] Site: [address] PT 15.2 INR POC 1.5 INR RANGE 2 - 3    Bleeding/hemorrhagic complications: no     Any changes in medication regimen? no     Any missed doses?: no         Allergies: 1)  ! Biaxin 2)  ! Cephalexin 3)  ! * Carbamazepine 4)  ! * Dicyclomine 5)  ! Sulfa 6)  ! Augmentin 7)  Cefaclor 8)  Dicyclomine Hcl (Dicyclomine Hcl) 9)  Sulfamethoxazole (Sulfamethoxazole)  Anticoagulation Management History:      Her anticoagulation is being managed by telephone today.  Positive risk factors for bleeding include an age of 75 years or older and presence of serious comorbidities.  The bleeding index is 'intermediate risk'.  Positive CHADS2 values include History of CHF, Age > 20 years old, and History of Diabetes.  The start date was 02/29/2008.  Prothrombin time is 15.2.  Anticoagulation responsible provider: Eden Emms MD, Theron Arista.  INR POC: 1.5.  Exp: 07/2010.    Anticoagulation Management Assessment/Plan:      The patient's current anticoagulation dose is Warfarin sodium 5 mg tabs: Use as directed by Anticoagulation Clinic.  The target INR is 2 - 3.  The next INR is due 07/23/2009.  Anticoagulation instructions were given to patient/daughter/HH Agency.  Results were reviewed/authorized by Cloyde Reams, RN, BSN.  She was notified by Cloyde Reams RN.         Prior Anticoagulation Instructions: INR 2.0  Spoke with Kathie Rhodes, [facility], RN while at pt's home.  Advised to have pt continue on same dosage of coumadin 1 tablet daily except 1.5 tablets on Mondays and Thursdays.  Recheck PT/INR on 07/16/09.  Current Anticoagulation Instructions: INR 1.5  Spoke with Kathie Rhodes, [facility], RN while at pt's home.  Advised to give pt an extra 1/2  tablet today then start taking 5mg  daily except 7.5mg  on Mondays, Thursdays, and Saturdays.  Recheck in 1 week.

## 2010-05-27 NOTE — Cardiovascular Report (Signed)
Summary: Office Visit Remote   Office Visit Remote   Imported By: Roderic Ovens 03/31/2010 14:52:39  _____________________________________________________________________  External Attachment:    Type:   Image     Comment:   External Document

## 2010-05-27 NOTE — Medication Information (Signed)
Summary: Coumadin Clinic  Anticoagulant Therapy  Managed by: Bethena Midget, RN, BSN Referring MD: Dr Jens Som PCP: Rockwell Alexandria Supervising MD: Eden Emms MD, Theron Arista Indication 1: Atrial Fibrillation (ICD-427.31) Lab Used: [facility] [place] Site: [address] PT 19.0 INR POC 2.4 INR RANGE 2 - 3  Dietary changes: no    Health status changes: no    Bleeding/hemorrhagic complications: no    Recent/future hospitalizations: no    Any changes in medication regimen? no    Recent/future dental: no  Any missed doses?: no       Is patient compliant with meds? yes      Comments: Pt took 5mg s daily except 7.5mg s Mon & Thurs  Allergies: 1)  ! Biaxin 2)  ! Cephalexin 3)  ! * Carbamazepine 4)  ! * Dicyclomine 5)  ! Sulfa 6)  ! Augmentin 7)  Cefaclor 8)  Dicyclomine Hcl (Dicyclomine Hcl) 9)  Sulfamethoxazole (Sulfamethoxazole)  Anticoagulation Management History:      Her anticoagulation is being managed by telephone today.  Positive risk factors for bleeding include an age of 75 years or older and presence of serious comorbidities.  The bleeding index is 'intermediate risk'.  Positive CHADS2 values include History of CHF, Age > 27 years old, and History of Diabetes.  The start date was 02/29/2008.  Prothrombin time is 19.0.  Anticoagulation responsible provider: Eden Emms MD, Theron Arista.  INR POC: 2.4.    Anticoagulation Management Assessment/Plan:      The patient's current anticoagulation dose is Warfarin sodium 5 mg tabs: Use as directed by Anticoagulation Clinic.  The target INR is 2 - 3.  The next INR is due 08/27/2009.  Anticoagulation instructions were given to patient/daughter/HH Agency.  Results were reviewed/authorized by Bethena Midget, RN, BSN.  She was notified by Bethena Midget, RN, BSN.         Prior Anticoagulation Instructions: INR 3.5  Spoke with Kathie Rhodes, [facility] RN while at pt's home.  Advised to have pt hold x 1 dose then resume same dosage 1 tablet daily except  1.5 tablets on Tuesdays, Thursdays, and Saturdays.  Recheck in 1 week.    Current Anticoagulation Instructions: INR 2.4 Continue dose pt. took, 5mg s daily except 7.5mg s on Mondays and Thursdays. Recheck in one week. Orders given to [facility] while at home of pt.

## 2010-05-27 NOTE — Medication Information (Signed)
Summary: Coumadin Clinic  Anticoagulant Therapy  Managed by: Cloyde Reams, RN, BSN Referring MD: Dr Jens Som PCP: Rockwell Alexandria Supervising MD: Juanda Chance MD, Beatrix Breece Indication 1: Atrial Fibrillation (ICD-427.31) Lab Used: [facility] [place] Site: [address] PT 27.9 INR POC 2.3 INR RANGE 2 - 3    Bleeding/hemorrhagic complications: no     Any changes in medication regimen? no     Any missed doses?: no       Is patient compliant with meds? yes       Allergies: 1)  ! Biaxin 2)  ! Cephalexin 3)  ! * Carbamazepine 4)  ! * Dicyclomine 5)  ! Sulfa 6)  ! Augmentin 7)  Cefaclor 8)  Dicyclomine Hcl (Dicyclomine Hcl) 9)  Sulfamethoxazole (Sulfamethoxazole)  Anticoagulation Management History:      Her anticoagulation is being managed by telephone today.  Positive risk factors for bleeding include an age of 75 years or older and presence of serious comorbidities.  The bleeding index is 'intermediate risk'.  Positive CHADS2 values include History of CHF, Age > 41 years old, and History of Diabetes.  The start date was 02/29/2008.  Prothrombin time is 27.9.  Anticoagulation responsible provider: Juanda Chance MD, Smitty Cords.  INR POC: 2.3.  Exp: 07/2010.    Anticoagulation Management Assessment/Plan:      The patient's current anticoagulation dose is Warfarin sodium 5 mg tabs: Use as directed by Anticoagulation Clinic.  The target INR is 2 - 3.  The next INR is due 12/10/2009.  Anticoagulation instructions were given to home health nurse.  Results were reviewed/authorized by Cloyde Reams, RN, BSN.  She was notified by Cloyde Reams RN.         Prior Anticoagulation Instructions: INR 1.6  Spoke with Kathie Rhodes, RN with [facility] while in pt's home.  Take 10mg  today, 7.5mg  tomorrow then resume same dose of 5mg  daily except 7.5mg  on Monday, Wednesday and Friday.  Recheck INR in 2 weeks.   Current Anticoagulation Instructions: INR 2.3  Spoke with Kathie Rhodes, [facility] RN advised to continue on same  dosage 1 tablet daily except 1.5 tablets on Mondays, Wednesdays, and Fridays.  Recheck in 2 weeks.

## 2010-05-27 NOTE — Letter (Signed)
Summary: Device-Delinquent Phone Transmission  [facility]  [address]   [place], [place] [address]   Phone: [phone]  Fax: [phone]     September 27, 2009 MRN: [id number]   [name] [address] [place], [place]  [address]   Dear Ms. Elsea,  According to our records, you were scheduled for a device phone transmission on                              .  09/23/09   We did not receive any results from this check.  If you transmitted on your scheduled day, please call us to help troubleshoot your system.  If you forgot to send your transmission, please send one upon receipt of this letter.  Thank you,  Milana Na, EMT-P  September 27, 2009 2:43 PM  [facility]

## 2010-05-27 NOTE — Medication Information (Signed)
Summary: Coumadin Clinic  Anticoagulant Therapy  Managed by: Cloyde Reams, RN, BSN Referring MD: Dr Jens Som PCP: Rockwell Alexandria Supervising MD: Clifton James MD, Cristal Deer Indication 1: Atrial Fibrillation (ICD-427.31) Lab Used: [facility] [place] Site: [address] PT 23.4 INR POC 2.0 INR RANGE 2 - 3  Dietary changes: no    Health status changes: no    Bleeding/hemorrhagic complications: no    Recent/future hospitalizations: no    Any changes in medication regimen? no    Recent/future dental: no  Any missed doses?: no       Is patient compliant with meds? yes       Allergies: 1)  ! Biaxin 2)  ! Cephalexin 3)  ! * Carbamazepine 4)  ! * Dicyclomine 5)  ! Sulfa 6)  ! Augmentin 7)  Cefaclor 8)  Dicyclomine Hcl 9)  Sulfamethoxazole (Sulfamethoxazole)  Anticoagulation Management History:      Her anticoagulation is being managed by telephone today.  Positive risk factors for bleeding include an age of 75 years or older and presence of serious comorbidities.  The bleeding index is 'intermediate risk'.  Positive CHADS2 values include History of CHF, Age > 75 years old, and History of Diabetes.  The start date was 02/29/2008.  Prothrombin time is 23.4.  Anticoagulation responsible provider: Clifton James MD, Cristal Deer.  INR POC: 2.0.  Exp: 07/2010.    Anticoagulation Management Assessment/Plan:      The patient's current anticoagulation dose is Warfarin sodium 5 mg tabs: Use as directed by Anticoagulation Clinic.  The target INR is 2 - 3.  The next INR is due 01/21/2010.  Anticoagulation instructions were given to home health nurse.  Results were reviewed/authorized by Cloyde Reams, RN, BSN.  She was notified by Cloyde Reams RN.         Prior Anticoagulation Instructions: INR 2.3  Spoke with Kathie Rhodes with [facility] while in pt's home.  Continue same dose of 5mg  daily except 7.5mg  on Monday, Wednesday and Friday.  Recheck INR on 9/20.   Current Anticoagulation  Instructions: INR 2.0  Spoke with [facility], RN while at pt's home.  Advised to have pt take 7.5mg  today, then resume same dosage 5mg  daily except 7.5mg  on MOndays, Wednesdays, and Fridays.  Recheck in 2 weeks.

## 2010-05-27 NOTE — Medication Information (Signed)
Summary: Coumadin Clinic  Anticoagulant Therapy  Managed by: Weston Brass, PharmD Referring MD: Dr Jens Som PCP: Rockwell Alexandria Supervising MD: Excell Seltzer MD, Casimiro Needle Indication 1: Atrial Fibrillation (ICD-427.31) Lab Used: [facility] [place] Site: [address] PT 16.2 INR POC 1.7 INR RANGE 2 - 3  Dietary changes: no    Health status changes: no    Bleeding/hemorrhagic complications: no    Recent/future hospitalizations: no    Any changes in medication regimen? no    Recent/future dental: no  Any missed doses?: yes     Details: missed Thursday's dose  Is patient compliant with meds? yes       Allergies: 1)  ! Biaxin 2)  ! Cephalexin 3)  ! * Carbamazepine 4)  ! * Dicyclomine 5)  ! Sulfa 6)  ! Augmentin 7)  Cefaclor 8)  Dicyclomine Hcl (Dicyclomine Hcl) 9)  Sulfamethoxazole (Sulfamethoxazole)  Anticoagulation Management History:      Her anticoagulation is being managed by telephone today.  Positive risk factors for bleeding include an age of 75 years or older and presence of serious comorbidities.  The bleeding index is 'intermediate risk'.  Positive CHADS2 values include History of CHF, Age > 33 years old, and History of Diabetes.  The start date was 02/29/2008.  Prothrombin time is 16.2.  Anticoagulation responsible provider: Excell Seltzer MD, Casimiro Needle.  INR POC: 1.7.  Exp: 07/2010.    Anticoagulation Management Assessment/Plan:      The patient's current anticoagulation dose is Warfarin sodium 5 mg tabs: Use as directed by Anticoagulation Clinic.  The target INR is 2 - 3.  The next INR is due 10/15/2009.  Anticoagulation instructions were given to home health nurse.  Results were reviewed/authorized by Weston Brass, PharmD.  She was notified by Weston Brass PharmD.         Prior Anticoagulation Instructions: INR 1.6 Today 10mg s Then change dose to 5mg s daily except 7.5mg  MWF. Recheck in 2 weeks. Orders given to [facility] with [facility] while in home with pt. Bethena Midget, RN,  BSN  September 30, 2009 11:21 AM   Current Anticoagulation Instructions: INR 1.7  Spoke with Kathie Rhodes with [facility] while in pt's home.  Take 10mg  tomorrow then resume same dose of 5mg  daily except 7.5mg  on Monday, Wednesday and Friday.  Recheck in 1 week.

## 2010-05-27 NOTE — Letter (Signed)
Summary: Appointment - Reschedule  [facility], Main Office  [address]   [place], [place] [address]   Phone: [phone]  Fax: [phone]     December 18, 2009 MRN: [id number]   [name] [address] [place], [place]  [address]   Dear Ms. Cadden,   Due to a change in our office schedule, your appointment on 01-22-2010 at    12:30 p.m.              must be changed.  It is very important that we reach you to reschedule this appointment. We look forward to participating in your health care needs. Please contact us at the number listed above at your earliest convenience to reschedule this appointment.     Sincerely,       Lorne Skeens  [facility] Scheduling Team

## 2010-05-27 NOTE — Assessment & Plan Note (Signed)
Summary: pc2 [facility] jude/pt was [facility] 06-19-09   Visit Type:  Follow-up Primary Provider:  Rockwell Alexandria  CC:  swelling in feet.  History of Present Illness: Ms. Buren returns today for followup.  She is a pleasant 77 yo woman with a h/o CADZ, atrial fibrillation and symptomatic bradycardia who is s/p PPM 12 months ago.  Since her PPM, she has had trouble with dyspnea and chronic wheezing/productive cough.  She denies c/p,  peripheral edema or syncope.  Current Medications (verified): 1)  Lantus 100 Unit/ml  Soln (Insulin Glargine) .... 32 Units Daily in The Morning 2)  Humalog 100 Unit/ml  Soln (Insulin Lispro (Human)) .... Give 10 Units Subcutaneously Three Times A Day If Blood Sugar Less Than 225.  Give 12 Units Subcutaneously Three Times Daily If Blood Sugar 225 or Higher. 3)  Synthroid 200 Mcg  Tabs (Levothyroxine Sodium) .... Take 1 Tablet By Mouth Once A Day 4)  Nitroquick 0.4 Mg  Subl (Nitroglycerin) .... As Needed 5)  Mucinex 600 Mg  Tb12 (Guaifenesin) .... As Needed 6)  Xopenex Hfa 45 Mcg/act Aero (Levalbuterol Tartrate) .... Inhale 2 Puff Using Inhaler Four Times A Day 7)  Furosemide 40 Mg Tabs (Furosemide) .... Take One Tablet By Mouth Daily. 8)  Metoprolol Succinate 100 Mg Xr24h-Tab (Metoprolol Succinate) .... Take One Tablet By Mouth Daily 9)  Potassium Chloride Cr 10 Meq Cr-Caps (Potassium Chloride) .... Take One Tablet By Mouth Daily 10)  Warfarin Sodium 5 Mg Tabs (Warfarin Sodium) .... Use As Directed By Anticoagulation Clinic 11)  Protonix 40 Mg Tbec (Pantoprazole Sodium) .... One Tablet By Mouth Daily 12)  Lovaza 1 Gm Caps (Omega-3-Acid Ethyl Esters) .... 2 Capsules By Mouth Two Times A Day 13)  Zoloft 100 Mg Tabs (Sertraline Hcl) .Marland Kitchen.. 1 Tab By Mouth Daily 14)  Exelon 4.6 Mg/24hr Pt24 (Rivastigmine) .... Apply 1 Patch To Skin Daily.  Rotate Sites, and Remove Previous Day's Patch Prior To Applying Next One. 15)  Cobal-1000 1000 Mcg/ml Soln (Cyanocobalamin)  .Marland Kitchen.. 1 Mg Subcutaneously Twice A Month For Anemia. 16)  Welchol 3.75 Gm Pack (Colesevelam Hcl) .... Uad 17)  Symbicort 160-4.5 Mcg/act Aero (Budesonide-Formoterol Fumarate) .Marland Kitchen.. 1 Inhalation By Mouth Two Times A Day. Rinse Mouth After Use. 18)  Benzonatate 200 Mg Caps (Benzonatate) .Marland Kitchen.. 1 By Mouth Three Times A Day For Cough 19)  Xopenex  Nebu (Levalbuterol Hcl) .... Uad  Allergies: 1)  ! Biaxin 2)  ! Cephalexin 3)  ! * Carbamazepine 4)  ! * Dicyclomine 5)  ! Sulfa 6)  ! 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  The patient denies chest pain, syncope, and peripheral edema.    Vital Signs:  Patient profile:   77 year old female Height:      65.5 inches Weight:      201 pounds BMI:     33.06 O2 Sat:      95 % Pulse  rate:   80 / minute BP sitting:   116 / 56  (right arm)  Vitals Entered By: Laurance Flatten CMA (June 24, 2009 2:39 PM)  Physical Exam  General:  Well developed, well nourished, in no acute distress. Head:  normocephalic and atraumatic Eyes:  PERRLA/EOM intact; conjunctiva and lids normal. Mouth:  Teeth, gums and palate normal. Oral mucosa normal. Neck:  Neck supple, no JVD. No masses, thyromegaly or abnormal cervical nodes. Chest Wall:  Well healed  PPM. Lungs:  Diffuse wheezing throughout with minimal increase work of breathing.Marland Kitchen Heart:  IRIR with a normal S1 and S2. A soft systolic murmur is present at the LLSB.  Abdomen:  Bowel sounds positive; abdomen soft and non-tender without masses, organomegaly, or hernias noted. No hepatosplenomegaly. Msk:  Back normal, normal gait. Muscle strength and tone normal. Pulses:  pulses normal in all 4 extremities Extremities:  No clubbing or cyanosis. Neurologic:  Alert and oriented x 3.   PPM Specifications Following MD:  Lewayne Bunting, MD     PPM Vendor:  [employer]     PPM Model Number:  [id number]     PPM Serial Number:  [id number] PPM DOI:  05/29/2008      Lead 1    Location: RV     DOI: 05/29/2008     Model #: [id number]     Serial #: [id number]     Status: active Lead 2    Location: LV     DOI: 05/29/2008     Model #: [id number]     Serial #: [id number]     Status: abandoned  Magnet Response Rate:  BOL 100 ERI  85  Indications:  ICM; A-fib  AV node alblation  Explantation Comments:  LV lead off  PPM Follow Up Remote Check?  No Battery Voltage:  2.99 V     Battery Est. Longevity:  8.6 years     Pacer Dependent:  Yes     Right Ventricle  Amplitude: 12 mV, Impedance: 480 ohms, Threshold: 0.5 V at 0.4 msec  Episodes Coumadin:  Yes Ventricular High Rate:  1     Ventricular Pacing:  100%  Parameters Mode:  VVIR     Lower Rate Limit:  70     Upper Rate Limit:  110 Next Remote Date:  09/23/2009     Next Cardiology Appt Due:  05/28/2010 Tech Comments:  Base rate 70.  A-fib, + coumadin.  1 VHR episode 17 beats.  Merlin transmissions every 3 months.  ROV 1 year with Dr. Ladona Ridgel. Altha Harm, LPN  June 24, 2009 3:14 PM  MD Comments:  Agree with above.  Impression & Recommendations:  Problem # 1:  CARDIAC PACEMAKER IN SITU (ICD-V45.01) Her device is working normally.  Will recheck in several months.  Problem # 2:  CHF (ICD-428.0) She appears to be well controlled.  I have asked her to reduce her sodium  intake and continue her current meds. Her updated medication list for this problem includes:    Nitroquick 0.4 Mg Subl (Nitroglycerin) .Marland Kitchen... As needed    Furosemide 40 Mg Tabs (Furosemide) .Marland Kitchen... Take one tablet by mouth daily.    Metoprolol Succinate 100 Mg Xr24h-tab (Metoprolol succinate) .Marland Kitchen... Take one tablet by mouth daily    Warfarin Sodium 5 Mg Tabs (Warfarin sodium) ..... Use as directed by anticoagulation clinic  Problem # 3:  Hx of ASTHMATIC BRONCHITIS, ACUTE (ICD-466.0) She is actively wheezing today though she does not have respiratory distress.  She  will followup with her pulmonologist. The following medications were removed from the medication list:    Spiriva Handihaler 18 Mcg Caps (Tiotropium bromide monohydrate) .Marland Kitchen... 1 capsule inhaled daily. Her updated medication list for this problem includes:    Xopenex Hfa 45 Mcg/act Aero (Levalbuterol tartrate) ..... Inhale 2 puff using inhaler four times a day    Symbicort 160-4.5 Mcg/act Aero (Budesonide-formoterol fumarate) .Marland Kitchen... 1 inhalation by mouth two times a day. rinse mouth after use.  Patient Instructions: 1)  Your physician recommends that you schedule a follow-up appointment in: 12 months with Dr Ladona Ridgel

## 2010-05-27 NOTE — Miscellaneous (Signed)
Summary: [facility] Orders   [facility] Orders   Imported By: Roderic Ovens 07/05/2009 15:11:48  _____________________________________________________________________  External Attachment:    Type:   Image     Comment:   External Document

## 2010-05-27 NOTE — Letter (Signed)
Summary: [facility] Note  [facility]   Imported By: Roderic Ovens 07/29/2009 16:44:23  _____________________________________________________________________  External Attachment:    Type:   Image     Comment:   External Document

## 2010-05-27 NOTE — Progress Notes (Signed)
Summary: refused to get up out of bed today  Phone Note Call from Patient Call back at Home Phone [phone]   Caller: Daughter- robin Reason for Call: Talk to Nurse Summary of Call: discuss mom situation , refused to get out of bed today. Initial call taken by: Lorne Skeens,  February 03, 2010 9:24 AM  Follow-up for Phone Call        spoke with pt dtr, pt was unable to get here today, her dtr says she refused to get out of bed. pt dtr states her dementia is getting worse. explained to dtr we are unable to cont to monitor her coumadin without seeing her at least once yearly. pt dtr voiced understanding and has rescheduled the appt Deliah Goody, RN  February 03, 2010 3:38 PM

## 2010-05-27 NOTE — Medication Information (Signed)
Summary: [facility]  Anticoagulant Therapy  Managed by: Bethena Midget, RN, BSN Referring MD: Dr Jens Som PCP: Rockwell Alexandria Supervising MD: Antoine Poche MD, Fayrene Fearing Indication 1: Atrial Fibrillation (ICD-427.31) Lab Used: [facility] [place] Site: [address] PT 15.5 INR POC 1.6 INR RANGE 2 - 3  Dietary changes: no    Health status changes: no    Bleeding/hemorrhagic complications: no    Recent/future hospitalizations: no    Any changes in medication regimen? no    Recent/future dental: no  Any missed doses?: no       Is patient compliant with meds? yes       Allergies: 1)  ! Biaxin 2)  ! Cephalexin 3)  ! * Carbamazepine 4)  ! * Dicyclomine 5)  ! Sulfa 6)  ! Augmentin 7)  Cefaclor 8)  Dicyclomine Hcl (Dicyclomine Hcl) 9)  Sulfamethoxazole (Sulfamethoxazole)  Anticoagulation Management History:      Her anticoagulation is being managed by telephone today.  Positive risk factors for bleeding include an age of 48 years or older and presence of serious comorbidities.  The bleeding index is 'intermediate risk'.  Positive CHADS2 values include History of CHF, Age > 61 years old, and History of Diabetes.  The start date was 02/29/2008.  Prothrombin time is 15.5.  Anticoagulation responsible provider: Antoine Poche MD, Fayrene Fearing.  INR POC: 1.6.    Anticoagulation Management Assessment/Plan:      The patient's current anticoagulation dose is Warfarin sodium 5 mg tabs: Use as directed by Anticoagulation Clinic.  The target INR is 2 - 3.  The next INR is due 10/15/2009.  Anticoagulation instructions were given to home health nurse.  Results were reviewed/authorized by Bethena Midget, RN, BSN.  She was notified by Bethena Midget, RN, BSN.         Prior Anticoagulation Instructions: INR 1.4 Today take 7.5mg s then resume 5mg s daily except 7.5mg s on Mondays and Thursdays. Recheck in one week. Orders given to [facility].   Current Anticoagulation Instructions: INR  1.6 Today 10mg s Then change dose to 5mg s daily except 7.5mg  MWF. Recheck in 2 weeks. Orders given to [facility] with [facility] while in home with pt. Bethena Midget, RN, BSN  September 30, 2009 11:21 AM

## 2010-05-27 NOTE — Medication Information (Signed)
Summary: Coumadin Clinic  Anticoagulant Therapy  Managed by: Weston Brass, PharmD Referring MD: Dr Jens Som PCP: Rockwell Alexandria Supervising MD: Daleen Squibb MD, Maisie Fus Indication 1: Atrial Fibrillation (ICD-427.31) Lab Used: [facility] [place]  Site: [address] PT 27.8 INR POC 2.3 INR RANGE 2 - 3  Dietary changes: no    Health status changes: no    Bleeding/hemorrhagic complications: no    Recent/future hospitalizations: no    Any changes in medication regimen? no    Recent/future dental: no  Any missed doses?: no       Is patient compliant with meds? yes       Allergies: 1)  ! Biaxin 2)  ! Cephalexin 3)  ! * Carbamazepine 4)  ! * Dicyclomine 5)  ! Sulfa 6)  ! Augmentin 7)  Cefaclor 8)  Dicyclomine Hcl (Dicyclomine Hcl) 9)  Sulfamethoxazole (Sulfamethoxazole)  Anticoagulation Management History:      Her anticoagulation is being managed by telephone today.  Positive risk factors for bleeding include an age of 75 years or older and presence of serious comorbidities.  The bleeding index is 'intermediate risk'.  Positive CHADS2 values include History of CHF, Age > 75 years old, and History of Diabetes.  The start date was 02/29/2008.  Prothrombin time is 27.8.  Anticoagulation responsible provider: Daleen Squibb MD, Maisie Fus.  INR POC: 2.3.  Exp: 07/2010.    Anticoagulation Management Assessment/Plan:      The patient's current anticoagulation dose is Warfarin sodium 5 mg tabs: Use as directed by Anticoagulation Clinic.  The target INR is 2 - 3.  The next INR is due 01/14/2010.  Anticoagulation instructions were given to home health nurse.  Results were reviewed/authorized by Weston Brass, PharmD.  She was notified by Weston Brass PharmD.         Prior Anticoagulation Instructions: INR 3.1 Skip tomorrow's dose. Then resume 5mg s daily except 7.5mg s on MWF. Recheck in 2 weeks. Orders given to [facility] with [facility] while at home.   Current Anticoagulation Instructions: INR  2.3  Spoke with Kathie Rhodes with [facility] while in pt's home.  Continue same dose of 5mg  daily except 7.5mg  on Monday, Wednesday and Friday.  Recheck INR on 9/20.

## 2010-05-27 NOTE — Medication Information (Signed)
Summary: Coumadin Clinic  Anticoagulant Therapy  Managed by: Bethena Midget, RN, BSN Referring MD: Dr Jens Som PCP: Rockwell Alexandria Supervising MD: Daleen Squibb MD, Maisie Fus Indication 1: Atrial Fibrillation (ICD-427.31) Lab Used: [facility] [place] Site: [address] INR POC 2.0 INR RANGE 2 - 3  Dietary changes: no    Health status changes: no    Bleeding/hemorrhagic complications: no    Recent/future hospitalizations: no    Any changes in medication regimen? no    Recent/future dental: no  Any missed doses?: no       Is patient compliant with meds? yes       Allergies: 1)  ! Biaxin 2)  ! Cephalexin 3)  ! * Carbamazepine 4)  ! * Dicyclomine 5)  ! Sulfa 6)  ! Augmentin 7)  Cefaclor 8)  Dicyclomine Hcl (Dicyclomine Hcl) 9)  Sulfamethoxazole (Sulfamethoxazole)  Anticoagulation Management History:      Her anticoagulation is being managed by telephone today.  Positive risk factors for bleeding include an age of 30 years or older and presence of serious comorbidities.  The bleeding index is 'intermediate risk'.  Positive CHADS2 values include History of CHF, Age > 77 years old, and History of Diabetes.  The start date was 02/29/2008.  Anticoagulation responsible provider: Daleen Squibb MD, Maisie Fus.  INR POC: 2.0.  Exp: 07/2010.    Anticoagulation Management Assessment/Plan:      The patient's current anticoagulation dose is Warfarin sodium 5 mg tabs: Use as directed by Anticoagulation Clinic.  The target INR is 2 - 3.  The next INR is due 08/27/2009.  Anticoagulation instructions were given to patient/daughter/HH Agency.  Results were reviewed/authorized by Bethena Midget, RN, BSN.         Prior Anticoagulation Instructions: INR 2.4 Continue dose pt. took, 5mg s daily except 7.5mg s on Mondays and Thursdays. Recheck in one week. Orders given to [facility] while at home of pt.   Current Anticoagulation Instructions: INR 2.0 no problems 5mg  once daily except 7.5mg  Mon and  Thur recheck 1 week

## 2010-05-27 NOTE — Medication Information (Signed)
Summary: Coumadin Clinic  Anticoagulant Therapy  Managed by: Bethena Midget, RN, BSN Referring MD: Dr Jens Som PCP: Kathie Rhodes [place] [place] Supervising MD: Eden Emms MD, Theron Arista Indication 1: Atrial Fibrillation (ICD-427.31) Lab Used: [facility] [place] Site: [address] PT 21.5 INR POC 1.8 INR RANGE 2 - 3  Dietary changes: no    Health status changes: no    Bleeding/hemorrhagic complications: no    Recent/future hospitalizations: no    Any changes in medication regimen? no    Recent/future dental: no  Any missed doses?: no       Is patient compliant with meds? yes       Allergies: 1)  ! Biaxin 2)  ! Cephalexin 3)  ! * Carbamazepine 4)  ! * Dicyclomine 5)  ! Sulfa 6)  ! Augmentin 7)  Cefaclor 8)  Dicyclomine Hcl 9)  Sulfamethoxazole (Sulfamethoxazole)  Anticoagulation Management History:      Her anticoagulation is being managed by telephone today.  Positive risk factors for bleeding include an age of 75 years or older and presence of serious comorbidities.  The bleeding index is 'intermediate risk'.  Positive CHADS2 values include History of CHF, Age > 69 years old, and History of Diabetes.  The start date was 02/29/2008.  Prothrombin time is 21.5.  Anticoagulation responsible provider: Eden Emms MD, Theron Arista.  INR POC: 1.8.    Anticoagulation Management Assessment/Plan:      The patient's current anticoagulation dose is Warfarin sodium 5 mg tabs: Use as directed by Anticoagulation Clinic.  The target INR is 2 - 3.  The next INR is due 02/04/2010.  Anticoagulation instructions were given to home health nurse.  Results were reviewed/authorized by Bethena Midget, RN, BSN.  She was notified by Bethena Midget, RN, BSN.         Prior Anticoagulation Instructions: INR 2.0  Spoke with [facility], RN while at pt's home.  Advised to have pt take 7.5mg  today, then resume same dosage 5mg  daily except 7.5mg  on MOndays, Wednesdays, and Fridays.  Recheck in 2 weeks.   Current  Anticoagulation Instructions: INR 1.8 Today 7.5mg s then 7.5mg s daily except 5mg s on Tuesdays, Thursdays and Saturdays. Recheck in 2 weeks. Oders given to [place] wtih [facility].

## 2010-05-27 NOTE — Medication Information (Signed)
Summary: Coumadin Clinic  Anticoagulant Therapy  Managed by: Leota Sauers, PharmD, BCPS, CPP Referring MD: Dr Jens Som PCP: Rockwell Alexandria Supervising MD: Antoine Poche MD, Fayrene Fearing Indication 1: Atrial Fibrillation (ICD-427.31) Lab Used: [facility] [place] Site: [address] INR POC 2.2 INR RANGE 2 - 3  Dietary changes: no    Health status changes: no    Bleeding/hemorrhagic complications: no    Recent/future hospitalizations: no    Any changes in medication regimen? no    Recent/future dental: no  Any missed doses?: no       Is patient compliant with meds? yes       Allergies: 1)  ! Biaxin 2)  ! Cephalexin 3)  ! * Carbamazepine 4)  ! * Dicyclomine 5)  ! Sulfa 6)  ! Augmentin 7)  Cefaclor 8)  Dicyclomine Hcl (Dicyclomine Hcl) 9)  Sulfamethoxazole (Sulfamethoxazole)  Anticoagulation Management History:      Her anticoagulation is being managed by telephone today.  Positive risk factors for bleeding include an age of 75 years or older and presence of serious comorbidities.  The bleeding index is 'intermediate risk'.  Positive CHADS2 values include History of CHF, Age > 43 years old, and History of Diabetes.  The start date was 02/29/2008.  Anticoagulation responsible provider: Antoine Poche MD, Fayrene Fearing.  INR POC: 2.2.  Exp: 07/2010.    Anticoagulation Management Assessment/Plan:      The patient's current anticoagulation dose is Warfarin sodium 5 mg tabs: Use as directed by Anticoagulation Clinic.  The target INR is 2 - 3.  The next INR is due 06/27/2009.  Anticoagulation instructions were given to patient/daughter/HH Agency.  Results were reviewed/authorized by Leota Sauers, PharmD, BCPS, CPP.         Prior Anticoagulation Instructions: INR 1.3 Today take 2 tablets then resume 1 tablet daily except 1.5 tablet on Mondays and Thursdays. Orders given to Lovenia Kim with [facility] with dose and recheck on 06/27/09.   Greater than 45 minutes spent with patient and  daughter to complete medication reconciliation and visit components.  Patient and daughter received medication list and calendar at completion of visit.  (7 disease states / 19 medications)  Current Anticoagulation Instructions: INR 2.2 [facility] to recheck INR next Thur  Continie same dose 1 tab once daily except 1 and 1/2 tab Mon, [employer]

## 2010-05-27 NOTE — Miscellaneous (Signed)
Summary: [facility] Orders   [facility] Orders   Imported By: Roderic Ovens 06/03/2009 14:42:32  _____________________________________________________________________  External Attachment:    Type:   Image     Comment:   External Document

## 2010-05-27 NOTE — Medication Information (Signed)
Summary: Coumadin Clinic  Anticoagulant Therapy  Managed by: Weston Brass, PharmD Referring MD: Dr Jens Som PCP: Rockwell Alexandria Supervising MD: Riley Kill MD, Maisie Fus Indication 1: Atrial Fibrillation (ICD-427.31) Lab Used: [facility] [place] Site: [address] PT 15.1 INR POC 1.7 INR RANGE 2 - 3  Dietary changes: no    Health status changes: no    Bleeding/hemorrhagic complications: no    Recent/future hospitalizations: no    Any changes in medication regimen? no    Recent/future dental: no  Any missed doses?: no       Is patient compliant with meds? yes      Comments: There was some confusion over dose pt actually given.  Caregiver states he gave her 7.5mg  on Saturday and today?  Unsure where that dose came from or why they gave that.  Stated the dose they had was 7.5mg  on Monday and Thursday rather than 7.5mg  on M, W,F (not changed from earlier this month).  Will continue on RIGHT dose and see what happens.   Allergies: 1)  ! Biaxin 2)  ! Cephalexin 3)  ! * Carbamazepine 4)  ! * Dicyclomine 5)  ! Sulfa 6)  ! Augmentin 7)  Cefaclor 8)  Dicyclomine Hcl (Dicyclomine Hcl) 9)  Sulfamethoxazole (Sulfamethoxazole)  Anticoagulation Management History:      Her anticoagulation is being managed by telephone today.  Positive risk factors for bleeding include an age of 70 years or older and presence of serious comorbidities.  The bleeding index is 'intermediate risk'.  Positive CHADS2 values include History of CHF, Age > 47 years old, and History of Diabetes.  The start date was 02/29/2008.  Prothrombin time is 15.1.  Anticoagulation responsible provider: Riley Kill MD, Maisie Fus.  INR POC: 1.7.  Exp: 07/2010.    Anticoagulation Management Assessment/Plan:      The patient's current anticoagulation dose is Warfarin sodium 5 mg tabs: Use as directed by Anticoagulation Clinic.  The target INR is 2 - 3.  The next INR is due 10/22/2009.  Anticoagulation instructions were given  to home health nurse.  Results were reviewed/authorized by Weston Brass, PharmD.  She was notified by Weston Brass PharmD.         Prior Anticoagulation Instructions: INR 1.7  Spoke with Kathie Rhodes with [facility] while in pt's home.  Take 10mg  tomorrow then resume same dose of 5mg  daily except 7.5mg  on Monday, Wednesday and Friday.  Recheck in 1 week.   Current Anticoagulation Instructions: INR 1.7  Spoke with Kathie Rhodes with [facility] while in pts home.  Pt already taken 7.5mg  today.  Make sure pt takes current dose of 5mg  daily except 7.5mg  on Monday, Wednesday and Friday.  Recheck in 1 week.

## 2010-05-27 NOTE — Medication Information (Signed)
Summary: ccr/ gd  Anticoagulant Therapy  Managed by: Bethena Midget, RN, BSN Referring MD: Dr Jens Som PCP: Rockwell Alexandria Supervising MD: Antoine Poche MD, Fayrene Fearing Indication 1: Atrial Fibrillation (ICD-427.31) Lab Used: [facility] [place] Site: [address] INR POC 1.3 INR RANGE 2 - 3  Dietary changes: no    Health status changes: yes       Details: Treated for Viral Bronchitis  Bleeding/hemorrhagic complications: no    Recent/future hospitalizations: no    Any changes in medication regimen? no    Recent/future dental: no  Any missed doses?: no       Is patient compliant with meds? yes      Comments: Discharge from [facility] on 06/19/09.   Current Medications (verified): 1)  Lantus 100 Unit/ml  Soln (Insulin Glargine) .... 32 Units Daily in The Morning 2)  Humalog 100 Unit/ml  Soln (Insulin Lispro (Human)) .... Give 10 Units Subcutaneously Three Times A Day If Blood Sugar Less Than 225.  Give 12 Units Subcutaneously Three Times Daily If Blood Sugar 225 or Higher. 3)  Synthroid 200 Mcg  Tabs (Levothyroxine Sodium) .... Take 1 Tablet By Mouth Once A Day 4)  Nitroquick 0.4 Mg  Subl (Nitroglycerin) .... As Needed 5)  Mucinex 600 Mg  Tb12 (Guaifenesin) .... Take 2 Two Times A Day 6)  Xopenex Hfa 45 Mcg/act Aero (Levalbuterol Tartrate) .... Inhale 2 Puff Using Inhaler Four Times A Day 7)  Furosemide 40 Mg Tabs (Furosemide) .... Take One Tablet By Mouth Daily. 8)  Metoprolol Succinate 100 Mg Xr24h-Tab (Metoprolol Succinate) .... Take One Tablet By Mouth Daily 9)  Potassium Chloride Cr 10 Meq Cr-Caps (Potassium Chloride) .... Take One Tablet By Mouth Daily 10)  Warfarin Sodium 5 Mg Tabs (Warfarin Sodium) .... Use As Directed By Anticoagulation Clinic 11)  Protonix 40 Mg Tbec (Pantoprazole Sodium) .... One Tablet By Mouth Daily 12)  Lovaza 1 Gm Caps (Omega-3-Acid Ethyl Esters) .... 2 Capsules By Mouth Two Times A Day 13)  Zoloft 100 Mg Tabs (Sertraline Hcl) .Marland Kitchen.. 1 Tab By Mouth  Daily 14)  Exelon 4.6 Mg/24hr Pt24 (Rivastigmine) .... Apply 1 Patch To Skin Daily.  Rotate Sites, and Remove Previous Day's Patch Prior To Applying Next One. 15)  Cobal-1000 1000 Mcg/ml Soln (Cyanocobalamin) .Marland Kitchen.. 1 Mg Subcutaneously Twice A Month For Anemia. 16)  Spiriva Handihaler 18 Mcg Caps (Tiotropium Bromide Monohydrate) .Marland Kitchen.. 1 Capsule Inhaled Daily. 17)  Welchol 625 Mg Tabs (Colesevelam Hcl) .... 3 Tabs By Mouth Two Times A Day For Cholesterol. 18)  Symbicort 160-4.5 Mcg/act Aero (Budesonide-Formoterol Fumarate) .Marland Kitchen.. 1 Inhalation By Mouth Two Times A Day. Rinse Mouth After Use. 19)  Benzonatate 200 Mg Caps (Benzonatate) .Marland Kitchen.. 1 By Mouth Three Times A Day For Cough  Allergies (verified): 1)  ! Biaxin 2)  ! Cephalexin 3)  ! * Carbamazepine 4)  ! * Dicyclomine 5)  ! Sulfa 6)  ! Augmentin 7)  Cefaclor 8)  Dicyclomine Hcl (Dicyclomine Hcl) 9)  Sulfamethoxazole (Sulfamethoxazole)  Anticoagulation Management History:      The patient is taking warfarin and comes in today for a routine follow up visit.  Positive risk factors for bleeding include an age of 89 years or older and presence of serious comorbidities.  The bleeding index is 'intermediate risk'.  Positive CHADS2 values include History of CHF, Age > 2 years old, and History of Diabetes.  The start date was 02/29/2008.  Anticoagulation responsible provider: Antoine Poche MD, Fayrene Fearing.  INR POC: 1.3.  Cuvette  Lot#: [id number].  Exp: 07/2010.    Anticoagulation Management Assessment/Plan:      The patient's current anticoagulation dose is Warfarin sodium 5 mg tabs: Use as directed by Anticoagulation Clinic.  The target INR is 2 - 3.  The next INR is due 06/27/2009.  Anticoagulation instructions were given to patient/daughter/HH Agency.  Results were reviewed/authorized by Bethena Midget, RN, BSN.  She was notified by Bethena Midget, RN, BSN.         Prior Anticoagulation Instructions: INR 2.9  Spoke with [facility] RN, advised to continue pt on same dosage  1 tablet daily except 1.5 tablets on Mondays and Thursdays.  Recheck in 4 weeks.    Current Anticoagulation Instructions: INR 1.3 Today take 2 tablets then resume 1 tablet daily except 1.5 tablet on Mondays and Thursdays. Orders given to Lovenia Kim with [facility] with dose and recheck on 06/27/09.   Greater than 45 minutes spent with patient and daughter to complete medication reconciliation and visit components.  Patient and daughter received medication list and calendar at completion of visit.  (7 disease states / 19 medications) Prescriptions: LOVAZA 1 GM CAPS (OMEGA-3-ACID ETHYL ESTERS) 2 capsules by mouth two times a day  #120 x 1   Entered by:   Bethena Midget, RN, BSN   Authorized by:   Ferman Hamming, MD, [facility]   Signed by:   Bethena Midget, RN, BSN on 06/20/2009   Method used:   Electronically to        [facility] (retail)       [address]       [place], [place]  [address]       Ph: [phone] or [phone]       Fax: [phone]   RxID:   [id number] FUROSEMIDE 40 MG TABS (FUROSEMIDE) Take one tablet by mouth daily.  #30 x 1   Entered by:   Bethena Midget, RN, BSN   Authorized by:   Ferman Hamming, MD, [facility]   Signed by:   Bethena Midget, RN, BSN on 06/20/2009   Method used:   Electronically to        [facility] (retail)       [address]       [place], [place]  [address]       Ph: [phone] or [phone]       Fax: [phone]   RxID:   [id number] METOPROLOL SUCCINATE 100 MG XR24H-TAB (METOPROLOL SUCCINATE) Take one tablet by mouth daily  #30 x 1   Entered by:   Bethena Midget, RN, BSN   Authorized by:   Ferman Hamming, MD, [facility]   Signed by:   Bethena Midget, RN, BSN on 06/20/2009   Method used:   Electronically to        [facility] (retail)       [address]       [place], [place]  [address]       Ph: [phone] or  [phone]       Fax: [phone]   RxID:   [id number]

## 2010-05-27 NOTE — Letter (Signed)
Summary: CMN for Oxygen/[facility]  CMN for Oxygen/[facility]   Imported By: Sherian Rein 07/25/2009 14:31:29  _____________________________________________________________________  External Attachment:    Type:   Image     Comment:   External Document

## 2010-05-27 NOTE — Letter (Signed)
Summary: Handout Printed  Printed Handout:  - Coumadin Instructions-LARGE FONT 

## 2010-05-27 NOTE — Medication Information (Signed)
Summary: Coumadin Clinic  Anticoagulant Therapy  Managed by: Weston Brass, PharmD Referring MD: Dr Jens Som PCP: Rockwell Alexandria Supervising MD: Gala Romney MD, Reuel Boom Indication 1: Atrial Fibrillation (ICD-427.31) Lab Used: [facility] [place] Site: [address] PT 16.4 INR POC 2.3 INR RANGE 2 - 3  Dietary changes: no    Health status changes: no    Bleeding/hemorrhagic complications: no    Recent/future hospitalizations: no    Any changes in medication regimen? no    Recent/future dental: no  Any missed doses?: no       Is patient compliant with meds? yes       Allergies: 1)  ! Biaxin 2)  ! Cephalexin 3)  ! * Carbamazepine 4)  ! * Dicyclomine 5)  ! Sulfa 6)  ! Augmentin 7)  Cefaclor 8)  Dicyclomine Hcl (Dicyclomine Hcl) 9)  Sulfamethoxazole (Sulfamethoxazole)  Anticoagulation Management History:      Her anticoagulation is being managed by telephone today.  Positive risk factors for bleeding include an age of 75 years or older and presence of serious comorbidities.  The bleeding index is 'intermediate risk'.  Positive CHADS2 values include History of CHF, Age > 2 years old, and History of Diabetes.  The start date was 02/29/2008.  Prothrombin time is 16.4.  Anticoagulation responsible provider: Andromeda Poppen MD, Reuel Boom.  INR POC: 2.3.  Exp: 07/2010.    Anticoagulation Management Assessment/Plan:      The patient's current anticoagulation dose is Warfarin sodium 5 mg tabs: Use as directed by Anticoagulation Clinic.  The target INR is 2 - 3.  The next INR is due 09/24/2009.  Anticoagulation instructions were given to patient/daughter/HH Agency.  Results were reviewed/authorized by Weston Brass, PharmD.  She was notified by Weston Brass PharmD.         Prior Anticoagulation Instructions: INR 1.3 Today take 7.5mg s then resume 5mg s daliy except 7.5mg s on MOndays and Thursdays. Recheck INR in one week. Orders given to [facility] with [facility].   Current Anticoagulation  Instructions: INR 2.3  Spoke with Kathie Rhodes with [facility] while in pt's home.  Continue same dose of 1 tablet every day except 1 1/2 tablets on Monday and Thursday.  Recheck in 1 week.

## 2010-05-27 NOTE — Medication Information (Signed)
Summary: Coumadin Clinic  Anticoagulant Therapy  Managed by: Bethena Midget, RN, BSN Referring MD: Dr Jens Som PCP: Rockwell Alexandria Supervising MD: Ladona Ridgel MD, Sharlot Gowda Indication 1: Atrial Fibrillation (ICD-427.31) Lab Used: [facility] [place] Site: [address] PT 14.6 INR POC 1.4 INR RANGE 2 - 3  Dietary changes: no    Health status changes: no    Bleeding/hemorrhagic complications: no    Recent/future hospitalizations: no    Any changes in medication regimen? no    Recent/future dental: no  Any missed doses?: yes     Details: missed Saturdays dose  Is patient compliant with meds? yes       Allergies: 1)  ! Biaxin 2)  ! Cephalexin 3)  ! * Carbamazepine 4)  ! * Dicyclomine 5)  ! Sulfa 6)  ! Augmentin 7)  Cefaclor 8)  Dicyclomine Hcl (Dicyclomine Hcl) 9)  Sulfamethoxazole (Sulfamethoxazole)  Anticoagulation Management History:      Her anticoagulation is being managed by telephone today.  Positive risk factors for bleeding include an age of 75 years or older and presence of serious comorbidities.  The bleeding index is 'intermediate risk'.  Positive CHADS2 values include History of CHF, Age > 30 years old, and History of Diabetes.  The start date was 02/29/2008.  Prothrombin time is 14.6.  Anticoagulation responsible provider: Ladona Ridgel MD, Sharlot Gowda.  INR POC: 1.4.    Anticoagulation Management Assessment/Plan:      The patient's current anticoagulation dose is Warfarin sodium 5 mg tabs: Use as directed by Anticoagulation Clinic.  The target INR is 2 - 3.  The next INR is due 09/30/2009.  Anticoagulation instructions were given to patient/daughter/HH Agency.  Results were reviewed/authorized by Bethena Midget, RN, BSN.  She was notified by Bethena Midget, RN, BSN.         Prior Anticoagulation Instructions: INR 2.3  Spoke with Kathie Rhodes with [facility] while in pt's home.  Continue same dose of 1 tablet every day except 1 1/2 tablets on Monday and Thursday.  Recheck in 1  week.    Current Anticoagulation Instructions: INR 1.4 Today take 7.5mg s then resume 5mg s daily except 7.5mg s on Mondays and Thursdays. Recheck in one week. Orders given to [facility] with [facility].

## 2010-05-27 NOTE — Letter (Signed)
Summary: Remote Device Check  [facility], Main Office  [address]   [place], [place] [address]   Phone: [phone]  Fax: [phone]     March 19, 2010 MRN: [id number]   [name] [address] [place], [place]  [address]   Dear [name],   Your remote transmission was recieved and reviewed by your physician.  All diagnostics were within normal limits for you.  ___X___Your next office visit is scheduled for:  06-10-2010 @ 3pm with Dr Ladona Ridgel.                                Sincerely,  Vella Kohler

## 2010-05-27 NOTE — Cardiovascular Report (Signed)
Summary: [facility] Orders  [facility] Orders   Imported By: Roderic Ovens 08/26/2009 16:14:54  _____________________________________________________________________  External Attachment:    Type:   Image     Comment:   External Document

## 2010-05-27 NOTE — Progress Notes (Signed)
Summary: dtr robin calling   Phone Note Call from Patient   Caller: Daughter [phone] robin Reason for Call: Talk to Nurse Summary of Call: pt's dtr robin calling re continuing home health care Initial call taken by: Glynda Jaeger,  March 13, 2010 11:34 AM  Follow-up for Phone Call        Left message to call back Deliah Goody, RN  March 13, 2010 3:12 PM  spoke with pt dtr, she is very upset because her mother needs home health and we would not sign for it. explained to dtr that from a cardiac standpoint she is not homebound. she has mobility problems that limit her from being able to get out. explained to dtr she would need to talk with pt primary care md for the home health orders. pt and dtr very upset and angry. dr Jens Som aware of above and pt referred to primary care md. Deliah Goody, RN  March 13, 2010 5:23 PM

## 2010-05-27 NOTE — Procedures (Signed)
Summary: rov. gd   Allergies: 1)  ! Biaxin 2)  ! Cephalexin 3)  ! * Carbamazepine 4)  ! * Dicyclomine 5)  ! Sulfa 6)  ! Augmentin 7)  Cefaclor 8)  Dicyclomine Hcl 9)  Sulfamethoxazole (Sulfamethoxazole)   PPM Specifications Following MD:  Lewayne Bunting, MD     PPM Vendor:  [employer]     PPM Model Number:  [id number]     PPM Serial Number:  [id number] PPM DOI:  05/29/2008      Lead 1    Location: RV     DOI: 05/29/2008     Model #: [id number]     Serial #: [id number]     Status: active Lead 2    Location: LV     DOI: 05/29/2008     Model #: [id number]     Serial #: [id number]     Status: abandoned  Magnet Response Rate:  BOL 100 ERI  85  Indications:  ICM; A-fib  AV node alblation  Explantation Comments:  LV lead off  PPM Follow Up Remote Check?  No Battery Voltage:  2.98 V     Battery Est. Longevity:  9 years     Pacer Dependent:  No     Right Ventricle  Amplitude: 12 mV, Impedance: 510 ohms, Threshold: 0.5 V at 0.4 msec  Episodes Coumadin:  Yes Ventricular Pacing:  100%  Parameters Mode:  VVIR     Lower Rate Limit:  70     Upper Rate Limit:  110 Next Cardiology Appt Due:  05/28/2010 Tech Comments:  No parameter changes.  Device fuction normal.  ROV 3 months with Dr. Ladona Ridgel. Altha Harm, LPN  February 27, 2010 2:20 PM

## 2010-05-28 DIAGNOSIS — I059 Rheumatic mitral valve disease, unspecified: Secondary | ICD-10-CM

## 2010-05-28 DIAGNOSIS — R072 Precordial pain: Secondary | ICD-10-CM

## 2010-05-28 LAB — URINE CULTURE
Colony Count: NO GROWTH
Culture  Setup Time: 201201311316
Culture: NO GROWTH

## 2010-05-28 LAB — GLUCOSE, CAPILLARY: Glucose-Capillary: 210 mg/dL — ABNORMAL HIGH (ref 70–99)

## 2010-05-28 LAB — BASIC METABOLIC PANEL
BUN: 8 mg/dL (ref 6–23)
Calcium: 8.8 mg/dL (ref 8.4–10.5)
GFR calc non Af Amer: >60 mL/min (ref >60)
Potassium: 3.7 mEq/L (ref 3.5–5.1)
Sodium: 139 mEq/L (ref 135–145)

## 2010-05-28 LAB — CBC
MCHC: 29.8 g/dL — ABNORMAL LOW (ref 30.0–36.0)
MCV: 77.6 fL — ABNORMAL LOW (ref 78.0–100.0)
Platelets: 194 10*3/uL (ref 150–400)
RDW: 16.3 % — ABNORMAL HIGH (ref 11.5–15.5)
WBC: 5.7 10*3/uL (ref 4.0–10.5)

## 2010-05-28 LAB — PROTIME-INR: INR: 2.34 — ABNORMAL HIGH (ref 0.00–1.49)

## 2010-05-28 LAB — MAGNESIUM: Magnesium: 2 mg/dL (ref 1.5–2.5)

## 2010-05-28 LAB — CLOSTRIDIUM DIFFICILE BY PCR: Toxigenic C. Difficile by PCR: NEGATIVE

## 2010-05-29 LAB — FECAL LACTOFERRIN, QUANT

## 2010-05-29 LAB — CBC
HCT: 34.3 % — ABNORMAL LOW (ref 36.0–46.0)
Hemoglobin: 10.4 g/dL — ABNORMAL LOW (ref 12.0–15.0)
MCH: 23.6 pg — ABNORMAL LOW (ref 26.0–34.0)
MCHC: 30.3 g/dL (ref 30.0–36.0)

## 2010-05-29 LAB — GIARDIA/CRYPTOSPORIDIUM SCREEN(EIA)
Cryptosporidium Screen (EIA): NEGATIVE
Giardia Screen - EIA: NEGATIVE

## 2010-05-29 LAB — PROTIME-INR: INR: 2.83 — ABNORMAL HIGH (ref 0.00–1.49)

## 2010-05-29 LAB — BASIC METABOLIC PANEL
BUN: 7 mg/dL (ref 6–23)
Chloride: 107 mEq/L (ref 96–112)
Potassium: 4 mEq/L (ref 3.5–5.1)

## 2010-05-29 LAB — GLUCOSE, CAPILLARY: Glucose-Capillary: 232 mg/dL — ABNORMAL HIGH (ref 70–99)

## 2010-05-29 LAB — IRON AND TIBC
Saturation Ratios: 5 % — ABNORMAL LOW (ref 20–55)
UIBC: 368 ug/dL

## 2010-05-29 LAB — FOLATE: Folate: 9.9 ng/mL

## 2010-05-30 LAB — GLUCOSE, CAPILLARY
Glucose-Capillary: 159 mg/dL — ABNORMAL HIGH (ref 70–99)
Glucose-Capillary: 191 mg/dL — ABNORMAL HIGH (ref 70–99)

## 2010-06-02 NOTE — Consult Note (Signed)
[name], BRAKE                 ACCOUNT NO.:  [id number]  MEDICAL RECORD NO.:  [id number]          PATIENT TYPE:  INP  LOCATION:  4711                         FACILITY:  [facility]  PHYSICIAN:  [name]. Jens Som, MD, FACCDATE OF BIRTH:  1924/10/08  DATE OF CONSULTATION: DATE OF DISCHARGE:                                CONSULTATION   HISTORY OF PRESENT ILLNESS:  The patient is an 77 year old female with past medical history of coronary artery disease status post coronary bypassing graft, atrial fibrillation, status post AV node ablation, status post pacemaker, left atrial appendage thrombus, mild dementia, COPD, diabetes, hypertension, hyperlipidemia, von Willebrand's, and hypothyroidism whom I asked to evaluate for chest pain.  The patient's last Myoview was performed in November 2011.  Her ejection fraction was 47%.  There was a prior inferior infarct with mild peri-infarct ischemia.  Her last echocardiogram was in February 2010, and it showed an ejection fraction of 55%, mild left atrial enlargement and mild right ventricular enlargement.  Over the past 1 week, the patient describes occasional chest pain.  It is in the lower sternal area and described as a pressure.  It does not radiate.  It is not pleuritic.  It increases with lying flat and on her left side.  There is no associated shortness of breath, nausea or diaphoresis.  It lasts 1-2 seconds and resolves spontaneously.  It is not exertional.  She has also noticed diarrhea, and she states "I was concerned about dehydration."  She therefore presented for further evaluation.  Cardiology is asked to evaluate for her chest pain.  MEDICATIONS:  Her medications at present include 1. Lasix 20 mg p.o. daily. 2. Insulin. 3. Toprol 50 mg p.o. daily. 4. Mucinex. 5. Potassium 10 mEq p.o. daily. 6. Protonix 40 mg p.o. daily. 7. Sertraline 100 mg daily. 8. Synthroid 0.2 mg p.o. daily. 9. Coumadin as directed. 10.Xopenex.  PAST  MEDICAL HISTORY:  Significant for diabetes mellitus, hypertension, and hyperlipidemia.  She also has hypothyroidism.  She has a history of coronary artery disease and has had previous coronary artery bypassing graft in 2000.  She also has a history of diastolic dysfunction.  There is also permanent atrial fibrillation with prior AV node ablation and pacemaker placement.  She also has COPD.  There is mild dementia in her history.  There is also questionable history of von Willebrand and anemia.  The patient has had previous mandibular reconstruction with a bone graft from her ribs.  She has had prior appendectomy and hernia repair.  SOCIAL HISTORY:  She has remote history of tobacco use.  She does not consume alcohol.  She lives with her daughter.  FAMILY HISTORY:  Positive for coronary artery disease in her father.  REVIEW OF SYSTEMS:  She denies any headaches or fevers.  She does describe some chills.  There is no productive cough or hemoptysis. There is no dysphagia, odynophagia, melena, or hematochezia.  She has had diarrhea.  There is no dysuria or hematuria.  There is no rash, no seizure activity.  She does occasionally have some orthopnea, but she uses home oxygen and this improves.  There is no PND.  She does describe pedal edema.  Remaining systems are negative.  PHYSICAL EXAMINATION:  VITAL SIGNS:  Today, blood pressure of 107/79 and her pulse is 73.  Temperature is 98.7. GENERAL:  She is well developed, well nourished, in no acute distress. Skin is warm and dry.  She does not appear depressed.  There is no peripheral clubbing. BACK:  Normal. HEENT:  Normal with normal eyelids. NECK:  Supple with normal upstroke bilaterally.  No bruits heard.  There is no jugular venous distention.  I cannot appreciate thyromegaly. CHEST:  Clear to auscultation, normal expansion. CARDIOVASCULAR:  Regular rate and rhythm with a normal S1 and S2.  I cannot appreciate murmurs, rubs, or  gallops. ABDOMEN:  Nontender, nondistended.  Positive bowel sounds.  No hepatosplenomegaly.  No mass appreciated.  There is no abdominal bruit. Note, she is mildly tender over the lower sternal area.  She has 2+ femoral pulses bilaterally.  No bruits. EXTREMITIES:  No edema that I can palpate.  No cords.  She has 2+ dorsalis pedis pulses bilaterally.  She has a tattoo of her ankle. NEUROLOGIC:  Grossly intact.  LABORATORY DATA:  Hemoglobin A1c of 10.9.  Her TSH is normal at 2.311. Initial enzymes are negative.  Her white blood cell count is 9.2 with a hemoglobin of 11.7, hematocrit of 37.9.  Her platelet count is 212.  Her INR is 2.30.  Sodium is 138 with potassium of 3.4.  BUN and creatinine of 12 and 1.07.  A BNP is mildly elevated at 273.  Chest x-ray shows cardiomegaly, but no CHF.  Her electrocardiogram shows ventricular pacing with underlying atrial fibrillation.  DIAGNOSES: 1. Chest pain, the patient's symptoms are very atypical.  They last     from 1 to 2 seconds and resolve spontaneously.  They are increased     with lying flat and on her left side.  Note, she also has some     tenderness to palpation of her lower sternum.  She also has had a recent low-risk Myoview in November 2011.  I would recommend     cycling enzymes.  If they are negative, then I would not recommend     further ischemia evaluation.  She will continue on her Coumadin and     beta blocker.  Note, she is intolerant to statins. 2. Coronary artery disease status post coronary bypassing graft.  She     will continue on her Coumadin and beta-blocker.  I have not added     aspirin previously due to the risk of bleeding in association with     Coumadin. 3. Permanent atrial fibrillation.  She will continue on her Coumadin. 4. History of atrioventricular node ablation/pacemaker placement. 5. Diabetes mellitus. 6. Hypertension. 7. Hyperlipidemia. 8. Diarrhea, management per primary care.    [name] Jens Som,  MD, [facility]    BSC/MEDQ  D:  05/27/2010  T:  05/28/2010  Job:  [id number]  Electronically Signed by Olga Millers MD [facility] on 06/02/2010 08:11:16 AM

## 2010-06-09 NOTE — Discharge Summary (Signed)
NAMEJABRIA, [name]                 ACCOUNT NO.:  [id number]  MEDICAL RECORD NO.:  [id number]           PATIENT TYPE:  I  LOCATION:  4711                         FACILITY:  [facility]  PHYSICIAN:  Mauro Kaufmann, MD         DATE OF BIRTH:  10-24-1924  DATE OF ADMISSION:  05/27/2010 DATE OF DISCHARGE:  05/29/2010                              DISCHARGE SUMMARY   ADMISSION DIAGNOSES: 1. Chest pain, rule out acute coronary syndrome. 2. Atrial fibrillation. 3. Diastolic dysfunction and congestive heart failure. 4. Chronic obstructive pulmonary disease. 5. Diabetes mellitus. 6. Mild dementia. 7. Hyperlipidemia. 8. Hypothyroidism. 9. History of von Willebrand disease.  DISCHARGE DIAGNOSES: 1. Chest pain, resolved. 2. History of coronary artery disease, status post coronary artery     bypass graft.  The patient is currently on Coumadin and beta-     blocker. 3. Atrial fibrillation. 4. History of diabetes mellitus. 5. Hypertension. 6. Hyperlipidemia. 7. Diarrhea, resolved; with stool for Clostridium difficile negative.  TESTS PERFORMED DURING THE HOSPITAL STAY:  Chest x-ray on January 31st showed cardiomegaly with stable support apparatus.  Pertinent labs are anemia panel which shows iron of 18 with 5% percent saturation.  Stool for C diff was negative.  Fecal occult blood is negative.  Urine culture negative.  Cardiac enzymes x3 negative.  Hemoglobin A1c was 10.9.  Consults obtained during hospitalization include cardiology consultation.  BRIEF HISTORY AND PHYSICAL:  She is an old female with a history of CAD status post CABG in 2000, atrial fibrillation status post AV nodal ablation in 2010 with pacemaker placed.  The patient had multiple hospitalizations due to chest pain and other cardiac matters.  Last hospitalization was in 2011.  The patient had cardiac Myoview, which was thought to be low risk.  At this time, cardiac cath was deferred unless she has recurrent symptoms.  The  patient came with recurrent chest pain and was admitted with diagnosis of chest pain, rule out coronary artery disease, and the patient's cardiologist was consulted.  The patient was seen by Dr. Jens Som.  BRIEF HOSPITAL COURSE: 1. Chest pain.  The patient was seen by Cardiology, and they did not     feel that this was due to the cardiac reason as also the patient     had more reflux symptoms.  The pain was atypical.  The patient had     low risk Myoview in 2011 and cardiac enzymes were negative, so at     this time Cardiology did not recommend further ischemia evaluation.     They recommended to continue the patient on Coumadin and the beta-     blocker.  The patient is intolerant to statins. 2. Atrial fibrillation.  The patient is currently on Coumadin. 3. History of AV node ablation and pacemaker placement.  The patient     is stable. 4. Diabetes mellitus.  The patient has high hemoglobin A1c.  It     appears that the patient is noncompliant with the medications.  We     are going to have home health RN to assist patient with  medications, and also, will get PT/OT at home and nursing     assistant. 5. GERD.  The patient will be started on Protonix. 6. COPD.  The patient is oxygen dependent.  Will continue on Combivent     inhaler and the Xopenex inhaler.  The patient will follow up with     Dr. Orvan Falconer to get the PT/INR checked as the patient is currently     on Coumadin therapy.  It appears that the patient is noncompliant     with the medications as she says that she only takes medications as     needed, so we are going to have home health RN assessment for the     medications.  DISCHARGE MEDICATIONS: 1. Lasix 20 mg p.o. daily. 2. Toprol XL 50 mg p.o. daily. 3. Protonix 40 mg p.o. daily. 4. Combivent 2 puffs daily as needed. 5. Cyanocobalamin B12 one injection subcu twice a day. 6. Humalog 10-15 units subcu three times a day. 7. Lantus 42 units subcu every morning. 8.  Mucinex  600 mg 1-2 tablets by mouth daily as needed. 9. Potassium chloride 10 mEq p.o. daily. 10.Symbicort 1 puff inhaled twice a day. 11.Synthroid 200 mcg 1 tablet p.o. daily. 12.Warfarin 5 mg one to one and half tablets by mouth daily. 13.Xopenex 45 mcg 2 puffs inhaled every 4 hours as needed. 14.Zoloft 100 mg 1 tablet p.o. daily.  During the hospitalization, patient also had diarrhea.  Stool studies were done.  Stool for C diff was negative.  At this time, diarrhea has resolved.  Anemia.  The patient was found to have iron deficiency anemia with 5% saturation of iron.  The patient refused to take iron pills at this time.  She would need further workup for the iron deficiency anemia, though stool for occult blood was negative in the hospital.  The patient says that she has some childhood anemia and she does not remember the type at this time.  She will follow up with her primary care physician for further evaluation.  Patient will see Dr. Orvan Falconer at [facility] and follow up with him.    Mauro Kaufmann, MD    GL/MEDQ  D:  05/29/2010  T:  05/30/2010  Job:  [id number]  cc:   Dr. Orvan Falconer  Electronically Signed by Sibyl Parr Joelie Schou  on 06/09/2010 10:40:27 AM

## 2010-06-09 NOTE — Discharge Summary (Signed)
  NAME:  [name], [name] NO.:  [id number]  MEDICAL RECORD NO.:  [id number]           PATIENT TYPE:  LOCATION:                                 FACILITY:  PHYSICIAN:  Mauro Kaufmann, MD         DATE OF BIRTH:  02/22/1925  DATE OF ADMISSION: DATE OF DISCHARGE:                              DISCHARGE SUMMARY   ADDENDUM  Please make a note that the patient's discharge was held yesterday as the patient did not want to go home and refused to go yesterday.  This morning, the patient is able to go home and will continue to follow up with primary care doctor.  Please send copy of discharge summary, as well as the addendum, to Dr. Orvan Falconer in [place].     Mauro Kaufmann, MD     GL/MEDQ  D:  05/30/2010  T:  05/31/2010  Job:  [id number]  cc:   Dr. Orvan Falconer in [facility]  Electronically Signed by Mauro Kaufmann  on 06/09/2010 10:40:43 AM

## 2010-06-10 ENCOUNTER — Inpatient Hospital Stay (HOSPITAL_COMMUNITY)
Admission: EM | Admit: 2010-06-10 | Discharge: 2010-06-13 | DRG: 313 | Disposition: A | Discharge status: Home Health | Payer: Medicare Other | Attending: Family Medicine | Admitting: Family Medicine

## 2010-06-10 ENCOUNTER — Encounter: Payer: Medicare Other | Admitting: Internal Medicine

## 2010-06-10 DIAGNOSIS — F068 Other specified mental disorders due to known physiological condition: Secondary | ICD-10-CM | POA: Diagnosis present

## 2010-06-10 DIAGNOSIS — J4489 Other specified chronic obstructive pulmonary disease: Secondary | ICD-10-CM | POA: Diagnosis present

## 2010-06-10 DIAGNOSIS — D509 Iron deficiency anemia, unspecified: Secondary | ICD-10-CM | POA: Diagnosis present

## 2010-06-10 DIAGNOSIS — I2510 Atherosclerotic heart disease of native coronary artery without angina pectoris: Secondary | ICD-10-CM | POA: Diagnosis present

## 2010-06-10 DIAGNOSIS — D6800 Von Willebrand disease, unspecified: Secondary | ICD-10-CM | POA: Diagnosis present

## 2010-06-10 DIAGNOSIS — E876 Hypokalemia: Secondary | ICD-10-CM | POA: Diagnosis present

## 2010-06-10 DIAGNOSIS — R197 Diarrhea, unspecified: Secondary | ICD-10-CM | POA: Diagnosis not present

## 2010-06-10 DIAGNOSIS — I4891 Unspecified atrial fibrillation: Secondary | ICD-10-CM | POA: Diagnosis present

## 2010-06-10 DIAGNOSIS — I5030 Unspecified diastolic (congestive) heart failure: Secondary | ICD-10-CM | POA: Diagnosis present

## 2010-06-10 DIAGNOSIS — Z794 Long term (current) use of insulin: Secondary | ICD-10-CM

## 2010-06-10 DIAGNOSIS — Z7901 Long term (current) use of anticoagulants: Secondary | ICD-10-CM

## 2010-06-10 DIAGNOSIS — K922 Gastrointestinal hemorrhage, unspecified: Secondary | ICD-10-CM | POA: Diagnosis present

## 2010-06-10 DIAGNOSIS — E119 Type 2 diabetes mellitus without complications: Secondary | ICD-10-CM | POA: Diagnosis present

## 2010-06-10 DIAGNOSIS — J449 Chronic obstructive pulmonary disease, unspecified: Secondary | ICD-10-CM | POA: Diagnosis present

## 2010-06-10 DIAGNOSIS — I509 Heart failure, unspecified: Secondary | ICD-10-CM | POA: Diagnosis present

## 2010-06-10 DIAGNOSIS — R0789 Other chest pain: Principal | ICD-10-CM | POA: Diagnosis present

## 2010-06-10 DIAGNOSIS — D131 Benign neoplasm of stomach: Secondary | ICD-10-CM | POA: Diagnosis present

## 2010-06-10 DIAGNOSIS — Z950 Presence of cardiac pacemaker: Secondary | ICD-10-CM

## 2010-06-10 DIAGNOSIS — K219 Gastro-esophageal reflux disease without esophagitis: Secondary | ICD-10-CM | POA: Diagnosis present

## 2010-06-10 DIAGNOSIS — E785 Hyperlipidemia, unspecified: Secondary | ICD-10-CM | POA: Diagnosis present

## 2010-06-10 DIAGNOSIS — Z951 Presence of aortocoronary bypass graft: Secondary | ICD-10-CM

## 2010-06-10 DIAGNOSIS — E039 Hypothyroidism, unspecified: Secondary | ICD-10-CM | POA: Diagnosis present

## 2010-06-10 LAB — COMPREHENSIVE METABOLIC PANEL
AST: 33 U/L (ref 0–37)
Albumin: 3.4 g/dL — ABNORMAL LOW (ref 3.5–5.2)
BUN: 11 mg/dL (ref 6–23)
CO2: 26 mEq/L (ref 19–32)
Calcium: 8.2 mg/dL — ABNORMAL LOW (ref 8.4–10.5)
Creatinine, Ser: 1 mg/dL (ref 0.4–1.2)
GFR calc Af Amer: >60 mL/min (ref >60)
GFR calc non Af Amer: 53 mL/min — ABNORMAL LOW (ref >60)

## 2010-06-10 LAB — CBC
MCHC: 31.7 g/dL (ref 30.0–36.0)
Platelets: 262 10*3/uL (ref 150–400)
RDW: 16.5 % — ABNORMAL HIGH (ref 11.5–15.5)
WBC: 6.1 10*3/uL (ref 4.0–10.5)

## 2010-06-10 LAB — DIFFERENTIAL
Basophils Absolute: 0.1 10*3/uL (ref 0.0–0.1)
Eosinophils Absolute: 0.1 10*3/uL (ref 0.0–0.7)
Eosinophils Relative: 2 % (ref 0–5)

## 2010-06-10 LAB — URINALYSIS, ROUTINE W REFLEX MICROSCOPIC
Bilirubin Urine: NEGATIVE
Hgb urine dipstick: NEGATIVE
Nitrite: NEGATIVE
Specific Gravity, Urine: 1.01 (ref 1.005–1.030)
Urobilinogen, UA: 1 mg/dL (ref 0.0–1.0)
pH: 6 (ref 5.0–8.0)

## 2010-06-10 LAB — PROTIME-INR: INR: 3.46 — ABNORMAL HIGH (ref 0.00–1.49)

## 2010-06-10 LAB — OCCULT BLOOD, POC DEVICE: Fecal Occult Bld: NEGATIVE

## 2010-06-11 LAB — BASIC METABOLIC PANEL
BUN: 9 mg/dL (ref 6–23)
CO2: 33 mEq/L — ABNORMAL HIGH (ref 19–32)
Calcium: 8.1 mg/dL — ABNORMAL LOW (ref 8.4–10.5)
Creatinine, Ser: 0.99 mg/dL (ref 0.4–1.2)
Glucose, Bld: 92 mg/dL (ref 70–99)

## 2010-06-11 LAB — HEMOGLOBIN AND HEMATOCRIT, BLOOD
HCT: 33.4 % — ABNORMAL LOW (ref 36.0–46.0)
Hemoglobin: 10.2 g/dL — ABNORMAL LOW (ref 12.0–15.0)

## 2010-06-11 LAB — CBC
HCT: 32.6 % — ABNORMAL LOW (ref 36.0–46.0)
HCT: 32.6 % — ABNORMAL LOW (ref 36.0–46.0)
HCT: 33.8 % — ABNORMAL LOW (ref 36.0–46.0)
Hemoglobin: 10.1 g/dL — ABNORMAL LOW (ref 12.0–15.0)
Hemoglobin: 9.9 g/dL — ABNORMAL LOW (ref 12.0–15.0)
MCH: 24.2 pg — ABNORMAL LOW (ref 26.0–34.0)
MCH: 23.8 pg — ABNORMAL LOW (ref 26.0–34.0)
MCHC: 31 g/dL (ref 30.0–36.0)
MCHC: 30.4 g/dL (ref 30.0–36.0)
MCHC: 30.2 g/dL (ref 30.0–36.0)
RDW: 16.8 % — ABNORMAL HIGH (ref 11.5–15.5)

## 2010-06-11 LAB — TYPE AND SCREEN
ABO/RH(D): O POS
DAT, IgG: NEGATIVE

## 2010-06-11 LAB — GLUCOSE, CAPILLARY
Glucose-Capillary: 122 mg/dL — ABNORMAL HIGH (ref 70–99)
Glucose-Capillary: 140 mg/dL — ABNORMAL HIGH (ref 70–99)
Glucose-Capillary: 181 mg/dL — ABNORMAL HIGH (ref 70–99)
Glucose-Capillary: 219 mg/dL — ABNORMAL HIGH (ref 70–99)

## 2010-06-11 LAB — HEMOGLOBIN A1C: Mean Plasma Glucose: 232 mg/dL — ABNORMAL HIGH (ref <117)

## 2010-06-11 LAB — PROTIME-INR: Prothrombin Time: 35.6 seconds — ABNORMAL HIGH (ref 11.6–15.2)

## 2010-06-12 LAB — CBC
HCT: 34.3 % — ABNORMAL LOW (ref 36.0–46.0)
HCT: 35.1 % — ABNORMAL LOW (ref 36.0–46.0)
Hemoglobin: 10.7 g/dL — ABNORMAL LOW (ref 12.0–15.0)
MCH: 24.3 pg — ABNORMAL LOW (ref 26.0–34.0)
MCHC: 30.5 g/dL (ref 30.0–36.0)
Platelets: 225 10*3/uL (ref 150–400)
RBC: 4.4 MIL/uL (ref 3.87–5.11)
RDW: 16.8 % — ABNORMAL HIGH (ref 11.5–15.5)
WBC: 6.5 10*3/uL (ref 4.0–10.5)

## 2010-06-12 LAB — PROTIME-INR: INR: 3.25 — ABNORMAL HIGH (ref 0.00–1.49)

## 2010-06-12 LAB — GLUCOSE, CAPILLARY
Glucose-Capillary: 111 mg/dL — ABNORMAL HIGH (ref 70–99)
Glucose-Capillary: 123 mg/dL — ABNORMAL HIGH (ref 70–99)
Glucose-Capillary: 115 mg/dL — ABNORMAL HIGH (ref 70–99)
Glucose-Capillary: 204 mg/dL — ABNORMAL HIGH (ref 70–99)

## 2010-06-12 LAB — BASIC METABOLIC PANEL
BUN: 4 mg/dL — ABNORMAL LOW (ref 6–23)
Chloride: 107 mEq/L (ref 96–112)
Potassium: 4.6 mEq/L (ref 3.5–5.1)
Sodium: 146 mEq/L — ABNORMAL HIGH (ref 135–145)

## 2010-06-12 NOTE — Consult Note (Signed)
Summary: [facility] Consultation Report  [facility] Consultation Report   Imported By: Earl Many 06/04/2010 09:13:38  _____________________________________________________________________  External Attachment:    Type:   Image     Comment:   External Document

## 2010-06-13 LAB — GLUCOSE, CAPILLARY
Glucose-Capillary: 198 mg/dL — ABNORMAL HIGH (ref 70–99)
Glucose-Capillary: 190 mg/dL — ABNORMAL HIGH (ref 70–99)

## 2010-06-13 LAB — PROTIME-INR: INR: 2.2 — ABNORMAL HIGH (ref 0.00–1.49)

## 2010-06-18 ENCOUNTER — Inpatient Hospital Stay (HOSPITAL_COMMUNITY)
Admission: EM | Admit: 2010-06-18 | Discharge: 2010-06-22 | DRG: 190 | Disposition: A | Discharge status: Home Health | Payer: Medicare Other | Attending: Family Medicine | Admitting: Family Medicine

## 2010-06-18 ENCOUNTER — Telehealth: Payer: Self-pay | Admitting: Cardiology

## 2010-06-18 ENCOUNTER — Emergency Department (HOSPITAL_COMMUNITY): Payer: Medicare Other

## 2010-06-18 DIAGNOSIS — F0390 Unspecified dementia without behavioral disturbance: Secondary | ICD-10-CM | POA: Diagnosis present

## 2010-06-18 DIAGNOSIS — Z823 Family history of stroke: Secondary | ICD-10-CM

## 2010-06-18 DIAGNOSIS — Z8249 Family history of ischemic heart disease and other diseases of the circulatory system: Secondary | ICD-10-CM

## 2010-06-18 DIAGNOSIS — IMO0002 Reserved for concepts with insufficient information to code with codable children: Secondary | ICD-10-CM

## 2010-06-18 DIAGNOSIS — E1149 Type 2 diabetes mellitus with other diabetic neurological complication: Secondary | ICD-10-CM | POA: Diagnosis present

## 2010-06-18 DIAGNOSIS — Z79899 Other long term (current) drug therapy: Secondary | ICD-10-CM

## 2010-06-18 DIAGNOSIS — I5021 Acute systolic (congestive) heart failure: Secondary | ICD-10-CM | POA: Diagnosis present

## 2010-06-18 DIAGNOSIS — Z794 Long term (current) use of insulin: Secondary | ICD-10-CM

## 2010-06-18 DIAGNOSIS — E669 Obesity, unspecified: Secondary | ICD-10-CM | POA: Diagnosis present

## 2010-06-18 DIAGNOSIS — R0602 Shortness of breath: Secondary | ICD-10-CM

## 2010-06-18 DIAGNOSIS — E039 Hypothyroidism, unspecified: Secondary | ICD-10-CM | POA: Diagnosis present

## 2010-06-18 DIAGNOSIS — E785 Hyperlipidemia, unspecified: Secondary | ICD-10-CM | POA: Diagnosis present

## 2010-06-18 DIAGNOSIS — R5381 Other malaise: Secondary | ICD-10-CM | POA: Diagnosis present

## 2010-06-18 DIAGNOSIS — I509 Heart failure, unspecified: Secondary | ICD-10-CM | POA: Diagnosis present

## 2010-06-18 DIAGNOSIS — I10 Essential (primary) hypertension: Secondary | ICD-10-CM | POA: Diagnosis present

## 2010-06-18 DIAGNOSIS — J441 Chronic obstructive pulmonary disease with (acute) exacerbation: Principal | ICD-10-CM | POA: Diagnosis present

## 2010-06-18 DIAGNOSIS — E1142 Type 2 diabetes mellitus with diabetic polyneuropathy: Secondary | ICD-10-CM | POA: Diagnosis present

## 2010-06-18 DIAGNOSIS — D649 Anemia, unspecified: Secondary | ICD-10-CM | POA: Diagnosis present

## 2010-06-18 DIAGNOSIS — I4891 Unspecified atrial fibrillation: Secondary | ICD-10-CM | POA: Diagnosis present

## 2010-06-18 DIAGNOSIS — Z7901 Long term (current) use of anticoagulants: Secondary | ICD-10-CM

## 2010-06-18 DIAGNOSIS — E7800 Pure hypercholesterolemia, unspecified: Secondary | ICD-10-CM | POA: Diagnosis present

## 2010-06-18 DIAGNOSIS — Z87891 Personal history of nicotine dependence: Secondary | ICD-10-CM

## 2010-06-18 DIAGNOSIS — Z950 Presence of cardiac pacemaker: Secondary | ICD-10-CM

## 2010-06-18 DIAGNOSIS — I2510 Atherosclerotic heart disease of native coronary artery without angina pectoris: Secondary | ICD-10-CM | POA: Diagnosis present

## 2010-06-18 DIAGNOSIS — Z951 Presence of aortocoronary bypass graft: Secondary | ICD-10-CM

## 2010-06-18 DIAGNOSIS — K219 Gastro-esophageal reflux disease without esophagitis: Secondary | ICD-10-CM | POA: Diagnosis present

## 2010-06-18 LAB — DIFFERENTIAL
Basophils Absolute: 0 10*3/uL (ref 0.0–0.1)
Basophils Relative: 1 % (ref 0–1)
Eosinophils Relative: 2 % (ref 0–5)
Lymphocytes Relative: 24 % (ref 12–46)
Monocytes Absolute: 0.4 10*3/uL (ref 0.1–1.0)
Monocytes Relative: 7 % (ref 3–12)
Neutro Abs: 3.5 10*3/uL (ref 1.7–7.7)

## 2010-06-18 LAB — CK TOTAL AND CKMB (NOT AT ARMC)
CK, MB: 1.7 ng/mL (ref 0.3–4.0)
Relative Index: INVALID (ref 0.0–2.5)
Total CK: 88 U/L (ref 7–177)

## 2010-06-18 LAB — BASIC METABOLIC PANEL
BUN: 7 mg/dL (ref 6–23)
CO2: 29 mEq/L (ref 19–32)
Chloride: 101 mEq/L (ref 96–112)
Creatinine, Ser: 0.9 mg/dL (ref 0.4–1.2)
Glucose, Bld: 296 mg/dL — ABNORMAL HIGH (ref 70–99)
Potassium: 4.1 mEq/L (ref 3.5–5.1)

## 2010-06-18 LAB — CBC
HCT: 34.1 % — ABNORMAL LOW (ref 36.0–46.0)
Hemoglobin: 10.2 g/dL — ABNORMAL LOW (ref 12.0–15.0)
MCH: 23.9 pg — ABNORMAL LOW (ref 26.0–34.0)
MCHC: 29.9 g/dL — ABNORMAL LOW (ref 30.0–36.0)
RDW: 17.5 % — ABNORMAL HIGH (ref 11.5–15.5)

## 2010-06-18 LAB — PROTIME-INR: INR: 1.54 — ABNORMAL HIGH (ref 0.00–1.49)

## 2010-06-18 NOTE — Consult Note (Signed)
[name], [name]                 ACCOUNT NO.:  [id number]  MEDICAL RECORD NO.:  [id number]           PATIENT TYPE:  I  LOCATION:  6714                         FACILITY:  [facility]  PHYSICIAN:  Mertha Clyatt L. Malon Kindle., M.D.DATE OF BIRTH:  [birth year]  DATE OF CONSULTATION:  06/11/2010 DATE OF DISCHARGE:                                CONSULTATION   REFERRING PHYSICIAN:  [facility].  PRIMARY CARE PHYSICIAN:  Dr. Orvan Falconer in [place].  CARDIOLOGIST:  [name]. Jens Som, MD, [facility]  REASON FOR CONSULTATION:  Dark stools.  HISTORY:  This is an 77 year old African American female who has had a long history of GI issues.  She was admitted with passing tarry stools for 2 weeks.  She has also been complaining of epigastric pain and she denies the use adamantly of any steroids or nonsteroidal antiinflammatory drugs.  Her hemoglobin was 11.3, and is drifted to down to 9.9.  She is on Coumadin for atrial fibrillation.  Her INR was 3.46 and it is 3.56 today.  It has been requested that she receive some IV vitamin K or fresh frozen plasma which she refuses so far.  Her stools on initial occasion were Hemoccult negative.  Again, she is complaining of heartburn and indigestion, but has never had this before.  She has had a colonoscopy with a removal of colon polyps.  Her last colonoscopy was done by my partner, Dr. Dorena Cookey, it showed internal hemorrhoids, otherwise was negative.  This was done in April 2010, less than 2 years ago.  The patient was hospitalized with chest pain recently and was discharged on May 29, 2010.  She had a low iron saturation and the stool was negative for Clostridium difficile as it was for occult blood. The patient insisted she was bleeding but several stool Hemoccults apparently were negative.  Cardiac enzymes were negative and acute coronary syndrome was ruled out.  No further cardiac evaluation was recommended.  The patient was to follow up with Dr. Orvan Falconer  down in [place].  The patient had been doing okay until she began to pass a black stools which brought her to this emergency room.  MEDICATIONS ON ADMISSION:  Insulin, potassium, B12, Lasix, metoprolol, Mucinex, Protonix, Synthroid, Coumadin, Tenex inhaler, and Zoloft.  The patient notes that she is allergic to AUGMENTIN, BIAXIN, CECLOR, DICYCLOMINE, LABETALOL, LIPITOR, SULFA, TEGRETOL, and ASPIRIN.  MEDICAL HISTORY:  History of atrial fibrillation and coronary artery disease.  She has had previous CABG and has had AV nodal ablation with placement of a pacemaker.  She has congestive heart failure.  She has hypertension, diabetes, hyperlipidemia, a question of von Willebrand disease.  PREVIOUS SURGERIES:  Include appendectomy, repair of inguinal hernia. She has had some sort of repair in her jaw using a bone graft.  She has had the coronary bypass surgery.  PHYSICAL EXAMINATION:  VITAL SIGNS:  Temperature 98.2, pulse 73, blood pressure 123/73. GENERAL:  Pleasant, talkative white female, in no acute distress. HEENT:  Sclerae nonicteric. LUNGS:  Clear anteriorly and posteriorly. HEART:  No murmurs or gallops. ABDOMEN:  Soft, nondistended, nontender with the patient does note  some subjective tenderness in the subxiphoid area.  ASSESSMENT: 1. History of dark stools and drop in hemoglobin with stools negative     at least once on this admission.  The patient has had recent     colonoscopy less than 2 years ago that was negative other than     internal hemorrhoids.  This could be an upper gastrointestinal     bleed. 2. History of colon polyps:  Negative less than 2 years ago.  I do not     think that it is to be repeated. 3. Atrial fibrillation, on chronic anticoagulation.  The patient at     this point refuses to have this reversed.  It appeared that she may     have a blood clot.  PLAN:  We will recommend that we go ahead with a diagnostic endoscopy to evaluate her drop in  hemoglobin as well as her epigastric pain.  I suspect this is related to reflux.  Since she is not actively hemorrhagic, I think it is fine to not acutely reverse her anticoagulation but let her drift down.  At this point in time, I do not see a reason to go ahead with another colonoscopy.          ______________________________ Llana Aliment. Malon Kindle., M.D.     [name]  D:  06/11/2010  T:  06/12/2010  Job:  [id number]  cc:   Dr. Orvan Falconer Dr. Glendale Chard S. Jens Som, MD, [facility]  Electronically Signed by Carman Ching M.D. on 06/18/2010 01:43:26 PM

## 2010-06-18 NOTE — Op Note (Signed)
  [name], DEA                 ACCOUNT NO.:  [id number]  MEDICAL RECORD NO.:  [id number]           PATIENT TYPE:  I  LOCATION:  6714                         FACILITY:  [facility]  PHYSICIAN:  Abdulahi Schor L. Malon Kindle., M.D.DATE OF BIRTH:  [birth year]  DATE OF PROCEDURE:  06/12/2010 DATE OF DISCHARGE:                              OPERATIVE REPORT   PROCEDURE:  Esophagogastroduodenoscopy.  MEDICATIONS:  Cetacaine spray, fentanyl 40 mcg, and Versed 3 mg IV.  INDICATION:  The patient came in with a slight drop in hemoglobin and a history of dark stools.  Her hemoglobin was 11.3.  It is dropped into 10.2, but this is with fluids.  Her stools were negative.  She had had a previous history of dark stools.  She is on chronic Protonix.  This procedure is done to evaluate for an upper GI source of bleeding.  DESCRIPTION OF PROCEDURE:  Procedure had been explained to the patient and consent was obtained.  In the left lateral decubitus position, the Pentax upper scope was inserted into the esophagus with agglutination and advanced into the stomach.  The esophagus was completely normal and no varices or esophagitis.  Upon entering the stomach, there were multiple shallow gastric polyps, none of which were bleeding.  The patient has refused to have her Coumadin reversed and for this reason no biopsies were obtained.  Her INR is currently 3.2.  There was no ulceration, erosions, or inflammation.  The duodenum including the bulb and second portion were both normal.  The scope was withdrawn and the cardia was seen in the retroflexed view and was normal.  The patient tolerated the procedure well.  ASSESSMENT:  Gastric polyps, not biopsied because the patient refuses to stop Coumadin.  I am not sure if this is in the source of bleeding, but there is currently no clear bleeding.  PLAN:  We will go ahead and send her home and have her follow up with me and continue all of her current  medications.          ______________________________ Llana Aliment Malon Kindle., M.D.     [name]  D:  06/12/2010  T:  06/12/2010  Job:  [id number]  cc:   Dr. Geronimo Boot. Jens Som, MD, [facility]  Electronically Signed by Carman Ching M.D. on 06/18/2010 01:43:32 PM

## 2010-06-19 ENCOUNTER — Ambulatory Visit: Payer: Medicare Other | Admitting: Physician Assistant

## 2010-06-19 DIAGNOSIS — R0602 Shortness of breath: Secondary | ICD-10-CM

## 2010-06-19 DIAGNOSIS — M79609 Pain in unspecified limb: Secondary | ICD-10-CM

## 2010-06-19 LAB — COMPREHENSIVE METABOLIC PANEL
ALT: 22 U/L (ref 0–35)
AST: 26 U/L (ref 0–37)
Alkaline Phosphatase: 94 U/L (ref 39–117)
CO2: 32 mEq/L (ref 19–32)
Chloride: 98 mEq/L (ref 96–112)
GFR calc Af Amer: >60 mL/min (ref >60)
GFR calc non Af Amer: 53 mL/min — ABNORMAL LOW (ref >60)
Potassium: 4.7 mEq/L (ref 3.5–5.1)
Sodium: 139 mEq/L (ref 135–145)
Total Bilirubin: 0.5 mg/dL (ref 0.3–1.2)

## 2010-06-19 LAB — URINE CULTURE: Colony Count: 90000

## 2010-06-19 LAB — URINALYSIS, ROUTINE W REFLEX MICROSCOPIC
Hgb urine dipstick: NEGATIVE
Nitrite: NEGATIVE
Protein, ur: NEGATIVE mg/dL
Specific Gravity, Urine: 1.012 (ref 1.005–1.030)
Urobilinogen, UA: 1 mg/dL (ref 0.0–1.0)

## 2010-06-19 LAB — GLUCOSE, CAPILLARY
Glucose-Capillary: 55 mg/dL — ABNORMAL LOW (ref 70–99)
Glucose-Capillary: 256 mg/dL — ABNORMAL HIGH (ref 70–99)

## 2010-06-19 LAB — HEMOGLOBIN A1C: Mean Plasma Glucose: 226 mg/dL — ABNORMAL HIGH (ref <117)

## 2010-06-19 LAB — DIFFERENTIAL
Eosinophils Relative: 0 % (ref 0–5)
Lymphocytes Relative: 14 % (ref 12–46)
Lymphs Abs: 0.8 10*3/uL (ref 0.7–4.0)
Monocytes Relative: 5 % (ref 3–12)

## 2010-06-19 LAB — CBC
HCT: 31.8 % — ABNORMAL LOW (ref 36.0–46.0)
MCH: 24.5 pg — ABNORMAL LOW (ref 26.0–34.0)
MCV: 79.5 fL (ref 78.0–100.0)
RDW: 17.8 % — ABNORMAL HIGH (ref 11.5–15.5)
WBC: 5.4 10*3/uL (ref 4.0–10.5)

## 2010-06-19 LAB — APTT: aPTT: 50 seconds — ABNORMAL HIGH (ref 24–37)

## 2010-06-19 LAB — CARDIAC PANEL(CRET KIN+CKTOT+MB+TROPI)
CK, MB: 2 ng/mL (ref 0.3–4.0)
CK, MB: 2.2 ng/mL (ref 0.3–4.0)
Relative Index: INVALID (ref 0.0–2.5)
Relative Index: INVALID (ref 0.0–2.5)
Total CK: 75 U/L (ref 7–177)
Total CK: 87 U/L (ref 7–177)

## 2010-06-19 LAB — URINE MICROSCOPIC-ADD ON

## 2010-06-19 LAB — PHOSPHORUS: Phosphorus: 3.7 mg/dL (ref 2.3–4.6)

## 2010-06-19 LAB — MAGNESIUM: Magnesium: 1.9 mg/dL (ref 1.5–2.5)

## 2010-06-19 LAB — BRAIN NATRIURETIC PEPTIDE: Pro B Natriuretic peptide (BNP): 337 pg/mL — ABNORMAL HIGH (ref 0.0–100.0)

## 2010-06-20 ENCOUNTER — Encounter (INDEPENDENT_AMBULATORY_CARE_PROVIDER_SITE_OTHER): Payer: Self-pay | Admitting: *Deleted

## 2010-06-20 LAB — GLUCOSE, CAPILLARY
Glucose-Capillary: 203 mg/dL — ABNORMAL HIGH (ref 70–99)
Glucose-Capillary: 479 mg/dL — ABNORMAL HIGH (ref 70–99)

## 2010-06-20 LAB — PROTIME-INR
INR: 1.94 — ABNORMAL HIGH (ref 0.00–1.49)
Prothrombin Time: 22.3 seconds — ABNORMAL HIGH (ref 11.6–15.2)

## 2010-06-21 LAB — GLUCOSE, CAPILLARY
Glucose-Capillary: 190 mg/dL — ABNORMAL HIGH (ref 70–99)
Glucose-Capillary: 417 mg/dL — ABNORMAL HIGH (ref 70–99)

## 2010-06-21 LAB — PROTIME-INR: INR: 2.27 — ABNORMAL HIGH (ref 0.00–1.49)

## 2010-06-22 LAB — PROTIME-INR: INR: 2.09 — ABNORMAL HIGH (ref 0.00–1.49)

## 2010-06-24 NOTE — Letter (Signed)
Summary: Appointment - Missed  [facility], Main Office  [address]   [place], [place] [address]   Phone: [phone]  Fax: [phone]     June 20, 2010 MRN: [id number]   [name] [address] [place], [place]  [address]   Dear [name],  Our records indicate you missed your appointment on  06-10-10  with Dr.  Ladona Ridgel .                                    It is very important that we reach you to reschedule this appointment. We look forward to participating in your health care needs. Please contact us at the number listed above at your earliest convenience to reschedule this appointment.     Sincerely,    Glass blower/designer

## 2010-06-24 NOTE — Progress Notes (Signed)
Summary: chest pain/sob  Phone Note Call from Patient Call back at Home Phone [phone] Call back at [phone]   Caller: Nurse/advance home care Action Taken: Patient advised to go to ER Summary of Call: [name] states pt is having chf. pt is having coughing chest pain sob 4 pounds of weight gain pt is getting worse since yesterday. . [name] wanted to talk to a nurse re pt. I paged [facility] triage nurse twice. nurse called back saying she with a pt and i was not able to let her know why i needed a nurse. [name] was advised to take pt to er.  Initial call taken by: Roe Coombs,  June 18, 2010 12:24 PM  Follow-up for Phone Call        I spoke with Kathie Rhodes, [facility], she states pt is wheezing, increased sob, with scattered crackles. RA O2 sat 85 upon ambulation to bathroom and 91% on 2 L at rest.  Weight up 4-6 pounds. Pt has not taken any extra lasix. Pt also says " I don't have any veins for them to stick so how do they know what is wrong if they can't draw blood?"   Also spoke with daughter, [name], and pt.  Pt adamant about not going to nearest hospital. Pt wants to go to [facility] where " my doctors Dr. Jens Som and Dr. Maple Hudson are". Daughter will call EMS to take pt to [facility].   Follow-up by: Lisabeth Devoid RN,  June 18, 2010 12:56 PM

## 2010-06-26 NOTE — Discharge Summary (Signed)
NAMEADELISA, [name]                 ACCOUNT NO.:  [id number]  MEDICAL RECORD NO.:  [id number]           [name] TYPE:  I  LOCATION:  6714                         FACILITY:  [facility]  PHYSICIAN:  Lonia Blood, M.D.       DATE OF BIRTH:  10-10-1924  DATE OF ADMISSION:  06/10/2010 DATE OF DISCHARGE:  06/13/2010                              DISCHARGE SUMMARY   PRIMARY CARE PHYSICIAN:  Dr. Junious Dresser in [place], [place].  Please note that this makes [name] unassigned from [facility] admissions.  DISCHARGE DIAGNOSES: 1. Reported melena with Hemoccult negative stools and negative     endoscopy for upper gastrointestinal bleeding. 2. Coronary artery disease. 3. Atrial fibrillation, chronic Coumadin. 4. Iron deficiency anemia. 5. Chronic obstructive pulmonary disease. 6. Chronic diastolic congestive heart failure. 7. Diabetes mellitus type 2. 8. Hypothyroidism. 9. History of von-Willebrand disease. 10.History of atrial fibrillation. 11.Status post pacemaker placement. 12.Status post coronary artery bypass grafting in 2000. 13.Recent finding of gastric polyps.  Dr. Randa Evens is going to follow     up in [name] office with [name].  DISCHARGE MEDICATIONS: 1. Combivent 2 puffs daily p.r.n. 2. Vitamin B12 1000 mcg subcu twice monthly. 3. Iron 1 tablet daily. 4. Furosemide 20 mg daily. 5. Humalog 10-15 units 3 times a day sliding scale. 6. Lantus 42 units daily. 7. Mucinex 600 mg 1-2 tablets daily as needed. 8. Potassium chloride 10 mEq daily. 9. Protonix 40 mg twice a day. 10.Symbicort 160/4.5 one puff twice a day. 11.Synthroid 25 mcg daily. 12.Toprol-XL 50 mg daily. 13.Vitamin C 500 mg daily. 14.Warfarin 5 mg daily. 15.Xopenex 45 mcg inhale 2 puffs every 4 hours as needed. 16.Zoloft 100 mg daily.  CONDITION ON DISCHARGE:  [name] was discharged in good condition, alert, and oriented in no acute distress.  Temperature 98.3, heart rate 74,  respirations 18, blood pressure 134/74, saturation 96% on room air. [name] will follow up with Dr. Carman Ching from Gastroenterology, Dr. Olga Millers from Cardiology, Dr. Wyline Mood in [place] for PT/INR on Monday June 16, 2010.  PROCEDURE IN THIS ADMISSION:  [name] underwent upper endoscopy by Dr. Carman Ching.  Findings of gastric polyps and no source of bleeding.  HISTORY AND PHYSICAL:  Refer to dictated H and P which was done by Dr. Della Goo.  HOSPITAL COURSE:  [name] is an 77 year old woman with multiple medical problems presented to [facility] with complaints of melena.  Her hemoglobin was found to be 11.3 and by [name] next day went down to 10.2. We felt that this was due to dilution with IV fluids.  She did not display any signs of overt gastrointestinal bleeding.  Hemoglobin remained stable throughout this hospitalization, as a factor of fact it raised to 10.7 by [name] time of discharge.  We have attempted to convince [name] to take some vitamin K to correct her over anticoagulation. She refused emphatically.  In [name] end we held [name] Coumadin for 2 days and [name] INR corrected 2.2 at [name] time of discharge.  We decreased [name] dose of Coumadin to  5 mg daily and she will follow up with Dr. Wyline Mood for further adjustments of her Coumadin.  Upper endoscopy did not reveal any gastrointestinal bleeding and as a matter of fact we think [name] has chronic iron-deficiency anemia for which we gave her intravenous iron and we put her on oral iron supplementation.  [name] rest of her medical problems remained stable and we have not made any other changes to her medication list.     Lonia Blood, M.D.     SL/MEDQ  D:  06/14/2010  T:  06/15/2010  Job:  [id number]  cc:   Fayrene Fearing L. Malon Kindle., M.D. Luretha Rued Jens Som, MD, [facility] [place] D. Maple Hudson, MD, FCCP, FACP [name], M.D.  Electronically Signed by Lonia Blood M.D. on  06/26/2010 05:31:40 PM

## 2010-06-29 NOTE — H&P (Signed)
NAMETHEDORA, RINGS NO.:  [id number]  MEDICAL RECORD NO.:  [id number]           PATIENT TYPE:  E  LOCATION:  [facility]                         FACILITY:  [facility]  PHYSICIAN:  Michiel Cowboy, MDDATE OF BIRTH:  1924/11/27  DATE OF ADMISSION:  06/18/2010 DATE OF DISCHARGE:                             HISTORY & PHYSICAL   ATTENDING PHYSICIAN:  Michiel Cowboy, MD  PRIMARY CARE PROVIDER:  Junious Dresser, MD in [place], [place].  The patient is unassigned to [facility].  CHIEF COMPLAINT:  Shortness of breath, feeling like her right leg has swollen and yesterday and almost presyncopal like episode.  The patient is an 77 year old female with history of atrial fibrillation, coronary artery disease, and CHF as well as COPD.  The patient stated that yesterday she noticed that her right leg was more swollen than the right and now she feels that her veins are protruding in her legs and rated worst.  Actually today, she was folding laundry and felt very short of breath at which point she called out to her daughter to come get her.  Because of her repeated complains of shortness of breath and wheezing, finally EMS was called and she was finally brought to [facility]  She was seen by Cardiology here who felt that her symptom is most explained by COPD and referred her to triad for admission.  Otherwise, she reported yesterday she had sharp chest pain and went away.  No fevers.  No chills.  She has been coughing ever since her discharge on June 13, 2010.  Of note from June 10, 2010 to June 13, 2010, she was admitted with melena, has Hemoccult-negative stools, was observed then her hemoglobin was stable at which point she was discharged home back on her Coumadin.  She did have an EGD, which showed to be normal.  Otherwise, review of systems are negative.  PAST MEDICAL HISTORY:  Significant for: 1. Coronary artery disease. 2. Atrial  fibrillation on chronic Coumadin. 3. Iron-deficiency anemia. 4. COPD. 5. Diastolic congestive heart failure. 6. Diabetes mellitus type 2. 7. Hypothyroidism. 8. History of von Willebrand disease. 9. History of status post pacemaker placement. 10.History of status post CABG in 2000. 11.Dementia.  SOCIAL HISTORY:  The patient resides at home.  She does not currently smoke or drink or abuse drugs.  FAMILY HISTORY:  Noncontributory.  MEDICATIONS: 1. Combivent 2 puffs daily as needed. 2. Vitamin B12 1000 mg twice monthly. 3. Iron over the counter daily. 4. Protonix 40 mg twice daily. 5. Potassium 10 mEq daily. 6. Lasix 40 mg daily. 7. Mucinex 1-2 dose tablets as needed. 8. Lantus 44 units every morning. 9. Humalog 10-15 units 3 times a day on sliding scale. 10.Zoloft 100 mg daily. 11.Xopenex 45 mcg inhaler 2 puffs every 4 hours as needed. 12.Coumadin 5 mg daily. 13.Vitamin C. 14.Toprol 25 mg daily. 15.Synthroid 25 mcg daily. 16.Symbicort 160/4.5 mcg inhaled 1 puff twice a day.  PHYSICAL EXAMINATION:  VITAL SIGNS:  Temperature 98.9, blood pressure 128/58, pulse 74, respirations 18, satting 99% on 2 L. GENERAL:  The patient appears to be  currently in no acute distress, conversant in full sentences. HEAD:  Nontraumatic.  Moist mucous membranes. LUNGS:  There are occasional wheezes but overall fair to good air movement. HEART:  Irregularly irregular, but no murmurs appreciated. ABDOMEN:  Obese, but nontender, nondistended. EXTREMITIES:  No clubbing, cyanosis, or edema currently appreciated.  I do not quite appreciate distention of the veins in the lower extremities that she is trying to point out. NEUROLOGIC:  Grossly intact.  The patient is alert and oriented to situation, but not fully appropriate with answering all the questions. Tangential speech.  LABORATORY DATA:  White blood cell count 5.3, hemoglobin 10.2.  Sodium 139, potassium 4.1, creatinine 0.9.  BNP 262.  Chest  x-ray unremarkable. EKG showing paced rhythm.  ASSESSMENT AND PLAN: 1. This is an 77 year old female with likely chronic obstructive     pulmonary disease exacerbation versus presyncope.  We will admit     for further evaluation. 2. Chronic obstructive pulmonary disease exacerbation.  We will admit,     give nebulizers, Avelox, prednisone.  I think symptoms of fairly     mild and should improve. 3. Presyncope.  She relates this to shortness of breath.  Given her     isolated leg swelling, we will check a D-dimer and Dopplerable leg     for a deep vein thrombosis.  We may consider CT angiogram of the     chest to rule out pulmonary embolism, although she is currently     chest pain-free, non-tachycardic, although she is taking beta-     blockers. 4. Debilitated.  The patient states she has hard time ambulating.  We     will have PT, OT evaluate the patient. 5. History of atrial fibrillation.  We will continue Coumadin, put on     Lovenox until she is therapeutic. 6. Diabetes mellitus.  Put on sliding scale.  Continue Lantus. 7. Prophylaxis.  Protonix and Coumadin.  CODE STATUS:  The patient wished to be full code.  Of note, the patient's family and herself does not wish to be put over 4700.  They have had some problems in that particular floor.  Please they advice to not admit her in the future.     Michiel Cowboy, MD     AVD/MEDQ  D:  06/18/2010  T:  06/18/2010  Job:  [id number]  Electronically Signed by Therisa Doyne MD on 06/29/2010 10:09:24 PM

## 2010-06-30 NOTE — H&P (Signed)
[name], [name]                 ACCOUNT NO.:  [id number]  MEDICAL RECORD NO.:  [id number]           PATIENT TYPE:  I  LOCATION:  6714                         FACILITY:  [facility]  PHYSICIAN:  Della Goo, M.D. DATE OF BIRTH:  October 08, 1924  DATE OF ADMISSION:  06/11/2010 DATE OF DISCHARGE:                             HISTORY & PHYSICAL   PRIMARY CARE PHYSICIAN:  Unassigned.  CHIEF COMPLAINT:  Black stools.  HISTORY OF PRESENT ILLNESS:  This is an 77 year old female who was brought to the emergency department emergently secondary to complaints of passing black tarry stools for the past 2 weeks.  The patient reports having worsening and more frequent black stools.  She also reports having abdominal discomfort prior to passing black stool.  She complains of having epigastric area abdominal pain, which radiates across her abdomen.  She denies having any nausea or vomiting.  The patient is on Coumadin therapy for atrial fibrillation and her INR was mildly elevated and found to be 3.46.  The patient had been advised to report to the emergency department secondary to her symptoms and abnormal laboratory studies, which had been performed by her primary care physician.  The patient's hemoglobin level was found to be 11.3 in the emergency department.  PAST MEDICAL HISTORY:  Significant for: 1. Coronary artery disease. 2. Atrial fibrillation, on Coumadin therapy. 3. COPD. 4. Congestive heart failure syndrome. 5. Type 2 diabetes mellitus, on insulin therapy. 6. Dyslipidemia. 7. Hypothyroidism. 8. Von Willebrand disease. 9. Anemia.  CURRENT MEDICATIONS:  Vitamin B12, Humalog insulin, potassium chloride, Lantus insulin, Lasix, metoprolol tartrate, Mucinex, cold and sinus, Protonix, Synthroid, Coumadin, Xopenex inhaler, and Zoloft.  ALLERGIES:  Listed per the medical records are ASPIRIN, AUGMENTIN, BIAXIN, CECLOR, DICYCLOMINE, LABETALOL, LIPITOR, SULFA, and TEGRETOL.  SOCIAL  HISTORY:  The patient denies any smoking, drinking, or illicit drug usage.  FAMILY HISTORY:  Noncontributory.  Please add to the past medical history of dementia.  REVIEW OF SYSTEMS:  Pertinent as mentioned above.  PHYSICAL EXAMINATION FINDINGS:  GENERAL:  This is an 77 year old elderly obese Caucasian female who is in no visible discomfort or acute distress. VITAL SIGNS:  Temperature 98.4, blood pressure 120/49, heart rate 73, respirations 19, O2 sats 99%. HEENT:  Normocephalic, atraumatic.  Pupils are equally round and reactive to light.  Extraocular movements are intact.  Funduscopic benign.  There is no scleral icterus.  Nares are patent bilaterally. Oropharynx is clear. NECK:  Supple.  Full range of motion.  No thyromegaly, adenopathy, or jugular venous distention. CARDIOVASCULAR:  Regular rate and rhythm.  No murmurs, gallops, or rubs appreciated. LUNGS:  Clear to auscultation bilaterally.  No rales, rhonchi, or wheezes. ABDOMEN:  Positive bowel sounds.  Soft, nontender, and nondistended.  No hepatosplenomegaly.  No rebound.  No guarding. EXTREMITIES:  Without cyanosis, clubbing, or edema. NEUROLOGIC:  The patient is alert and oriented x3.  Cranial nerves are intact.  There are no focal deficits on examination.  LABORATORY STUDIES:  White blood cell count 6.1, hemoglobin 11.3, hematocrit 35.6, MCV 78.1, platelets 262, neutrophils 68%, lymphocytes 21%.  Sodium 136, potassium 3.4, chloride 98, CO2 of  26, BUN 11, creatinine 1.0, and glucose 157.  Calcium level 8.2 and albumin 3.4, lipase 44.  Rectal examination performed by the EDP with fecal occult blood testing, which was found to be heme-negative.  Urinalysis negative.  Protime 34.8, INR 3.46.  ASSESSMENT:  An 77 year old female being admitted with: 1. Gastrointestinal bleeding/melena. 2. Anemia. 3. Coagulopathy secondary to Coumadin therapy. 4. Atrial fibrillation, on Coumadin therapy. 5. Von Willebrand disease. 6.  Type 2 diabetes mellitus on insulin therapy, but did not really say     that. 7. Dementia.  PLAN:  The patient will be admitted and monitored for further drops in her hemoglobin.  Serial H and H's will be performed and a type and screen has been ordered.  The patient has been placed on IV Protonix therapy.  A small dose of vitamin K will be administered.  The patient will not be fully reversed unless it is needed.  She was found to be heme-negative on the rectal examination.  She will be monitored closely for further changes in her hemoglobin level or signs of hemodynamic instability.  If conditions do change, the patient will be fully reversed.  SCDs have been ordered for DVT prophylaxis, and the patient's regular medications will be further verified.     Della Goo, M.D.     HJ/MEDQ  D:  06/11/2010  T:  06/11/2010  Job:  [id number]  Electronically Signed by Della Goo M.D. on 06/30/2010 08:17:13 PM

## 2010-07-03 NOTE — H&P (Signed)
[name], [name]                 ACCOUNT NO.:  [id number]  MEDICAL RECORD NO.:  [id number]          PATIENT TYPE:  EMS  LOCATION:  [facility]                         FACILITY:  [facility]  PHYSICIAN:  Lonia Blood, M.D.      DATE OF BIRTH:  02-09-1925  DATE OF ADMISSION:  05/27/2010 DATE OF DISCHARGE:                             HISTORY & PHYSICAL   PRIMARY CARE PHYSICIAN:  [name], MD  PRESENTING COMPLAINT:  Chest pain.  HISTORY OF PRESENT ILLNESS:  The patient is an 77 year old female with known history of coronary artery disease status post coronary artery bypass grafting in 2000, also atrial fibrillation status post AV nodal ablation in 2010 with pacemaker placed.  She has had multiple hospitalizations since then with chest pain and other cardiac matters. Her last hospitalization was in November 2011, at which point she had a cardiac Myoview that was thought to be low risk.  She was considered for possible cardiac cath but deferred unless she has recurrent symptoms. She came in today secondary to having recurrent chest pain.  Pain was centrally located and was said to be more pressure than anything else. It has since resolved on arrival in the ED, but per the patient, it has been recurrent in the last day or so.  Denied any nausea, vomiting, or diaphoresis.  Denied any radiation.  She denied any aggravating or relieving factors.  She also reported exertional dyspnea usually when she moves around.  It sounds like she gets it with minimal exertion. Reported some orthopnea and PND but that is all chronic.  She denied any cough.  Denied any fever or chills.  PAST MEDICAL HISTORY: 1. CHF and diastolic dysfunction.  Her last 2-D echo was in February     2010 showing an EF of 55%. 2. Coronary artery disease status post coronary artery bypass graft,     again in year 2000. 3. COPD. 4. Dementia. 5. Type 2 diabetes. 6. Hyperlipidemia. 7. Hypothyroidism. 8. Von Willebrand disorder. 9.  Anemia of chronic disease. 10.Status post pacemaker placement in 2010 after an AV nodal ablation.  ALLERGIES:  ASPIRIN, AUGMENTIN, BIAXIN, CEFACLOR, DICYCLOMINE, LABETALOL, LIPITOR, SULFA, and TEGRETOL.  CURRENT MEDICATIONS: 1. Lasix 20 mg daily. 2. Humalog sliding scale insulin. 3. Lantus 44 units subcu nightly. 4. Metoprolol 50 mg daily. 5. Mucinex Cold and Sinus 600 mg b.i.d. 6. Potassium chloride 10 mEq daily. 7. Protonix 40 mg daily. 8. Sertraline 100 mg daily. 9. Synthroid 0.2 mg daily. 10.Coumadin currently at 5 mg. 11.Xopenex MDI 2 puffs b.i.d.  SOCIAL HISTORY:  The patient currently lives in [place] with her family members.  Her daughter is primarily her caregiver.  No alcohol.  No IV drug use.  She used to smoke but quit more than 25 years ago.  FAMILY HISTORY:  Significant for hypertension.  REVIEW OF SYSTEMS:  All systems reviewed are currently negative except per HPI.  PHYSICAL EXAMINATION:  VITAL SIGNS:  Temperature 98.7, blood pressure 107/79 with a pulse of 73, respiratory rate 17, sats 97% on room air. GENERAL:  She is a pleasant woman, obese, stable, in no acute distress.  HEENT:  PERRL.  EOMI.  No pallor, no jaundice, no rhinorrhea. NECK:  Supple.  No JVD.  No lymphadenopathy. RESPIRATORY:  She has good air entry bilaterally.  No wheezes, no rales, no crackles. CARDIOVASCULAR:  She has a paced rhythm. ABDOMEN:  Soft and nontender with positive bowel sounds. EXTREMITIES:  No edema, cyanosis, or clubbing. SKIN:  No rashes.  No ulcers. MUSCULOSKELETAL:  No joint swelling, tenderness, or deformity.  LABORATORY DATA:  White count is 9.2, hemoglobin 11.7, platelet count of 212.  Sodium is 138, potassium 3.4, chloride 99, CO2 is 26, glucose 106, BUN 12, creatinine 1.07, and calcium 8.6.  BNP is 273.  PT 25.4, INR 2.30.  Chest x-ray showed cardiomegaly with stable support apparatus but no failure.  ASSESSMENT:  This is an 77 year old female presenting with  chest pain in the setting of coronary artery disease.  She also complains of shortness of breath with known diastolic dysfunction.  PLAN: 1. Chest pain.  We will admit the patient for observation.  Check her     serial cardiac enzymes.  The patient was seen again 2 months ago,     at that point she had a Myoview.  Per Cardiology, cardiac cath was     deferred.  We will consult her cardiologist and see if maybe this     time around they want to consider a cath if her chest pain     continues.  Otherwise, I will continue with nitro.  She is not to     get any ASPIRIN due to allergy, but we will continue with her     Coumadin until she is therapeutic, beta-blockers, morphine, and     oxygen, also her ACE inhibitor. 2. Atrial fibrillation.  She has a paced rhythm at this point. 3. Diastolic dysfunction and CHF.  I will continue with her home Lasix     dose. 4. COPD.  She seems stable now, she has no wheezing.  We will continue     with her nebulizers. 5. Type 2 diabetes.  Continue with her Lantus and sliding scale     insulin.  Put her on diabetic diet and check a hemoglobin A1c. 6. Mild dementia.  Seems stable at this point more or less and     communicating well. 7. Hyperlipidemia.  We will continue with statin, check her fasting     lipid panel. 8. Hypothyroidism.  Check TSH and continue with her Synthroid. 9. History of von Willebrand disease.  She seems also stable from that     point of view. 10.Hypokalemia.  Replete her potassium appropriately. 11.I will also put her on some GI prophylaxis with Protonix.  CODE STATUS:  The patient is a full code.     Lonia Blood, M.D.     Verlin Grills  D:  05/27/2010  T:  05/27/2010  Job:  [id number]  Electronically Signed by Lonia Blood M.D. on 07/02/2010 04:16:03 PM

## 2010-07-03 NOTE — Discharge Summary (Signed)
[name], [name]                 ACCOUNT NO.:  [id number]  MEDICAL RECORD NO.:  [id number]           PATIENT TYPE:  I  LOCATION:  3702                         FACILITY:  [facility]  PHYSICIAN:  Brendia Sacks, MD    DATE OF BIRTH:  Jan 10, 1925  DATE OF ADMISSION:  06/18/2010 DATE OF DISCHARGE:  06/22/2010                              DISCHARGE SUMMARY   PRIMARY CARE PHYSICIAN:  Dr. Wyline Mood.  CONDITION ON DISCHARGE:  Improved.  DISCHARGE DIAGNOSES: 1. Acute chronic obstructive pulmonary disease exacerbation. 2. Acute systolic congestive heart failure, resolved. 3. Atrial fibrillation, stable. 4. Diabetes mellitus type 2, uncontrolled by hemoglobin A1c, stable as     an inpatient. 5. Recent gastrointestinal bleed, stable. 6. Anemia, stable.  HISTORY OF PRESENT ILLNESS:  This is an 77 year old woman who presented to the emergency room with shortness of breath, who was seen in consultation with Cardiology in the emergency room.  Her symptoms were felt to be predominantly from COPD.  HOSPITAL COURSE: 1. Acute COPD exacerbation.  The patient was admitted to the medical     floor and was treated with nebulizer treatments as well as oxygen     and a 5-day course of Avelox.  She has been weaned to oral     steroids.  We will complete a slow taper in the outpatient setting.     She is stable on room air. 2. Acute systolic congestive heart failure.  This resolved rapidly.     Per Cardiology, it is felt to be a secondary issue and not     significant as far as her hospitalization goes.  The patient     continues on her chronic dose of Lasix and has been able to     tolerate continued beta-blocker therapy at this point.  Her most     recent echocardiogram demonstrated a left ventricular ejection     fraction of 45-50%.  This was noted from her previous     hospitalization.  This remained stable. 3. Atrial fibrillation.  This has remained stable.  She continues on  beta-blocker therapy as well as warfarin.  She has a pacemaker. 4. Diabetes mellitus type 2, uncontrolled by hemoglobin A1c.  This has     been stable in the inpatient setting.  She will continue on her     chronic doses of steroids that have been weaned. 5. Recent GI bleed.  No evidence of recurrence this hospitalization. 6. Anemia.  Iron deficient chronic disease, remained stable.  CONSULTATIONS:  Cardiology recommendations as above.  PROCEDURES:  None.  IMAGING:  Chest x-ray 2-view on June 18, 2010:  Cardiac enlargement with prominent pulmonary vascularity similar to prior study.  No developing consolidation or airspace disease.  MICROBIOLOGY:  Urine culture on June 18, 2010:  90000 colonies, multiple bacterial morphotypes present, none predominant.  PERTINENT LABORATORY STUDIES: 1. D-dimer was negative. 2. Hemoglobin A1c was 9.5. 3. TSH 1.248. 4. CBC notable for a hemoglobin of 9.8 which is stable compared to     previous values, white blood cell count, platelet count within     normal limits.  5. INR on discharge 2.09. 6. Blood sugars well controlled within the last 24 hours. 7. Basic metabolic panel, most recent check prior to discharge     unremarkable. 8. Cardiac enzymes mildly elevated with troponin of 0.10 secondary to     acute COPD and heart failure.  CK-MB and CK within normal limits.  DISCHARGE PHYSICAL EXAMINATION:  GENERAL:  The patient is feeling well. She has cough, but otherwise is doing okay. VITAL SIGNS:  Temperature is 98.3, pulse 73, respirations 18, blood pressure 154/79, O2 sat 96% on room air. GENERAL:  Quite talkative, very pleasant, gregarious. CARDIOVASCULAR:  Regular rate and rhythm.  No murmur, rub, or gallop. Telemetry, paced rhythm.  No arrhythmias. RESPIRATORY:  Mild wheeze.  Good air movement.  She is able to speak in full sentences.  Normal respiratory effort.  No rales or rhonchi. EXTREMITIES:  No lower extremity edema.  DISCHARGE  INSTRUCTIONS:  The patient will be discharged home today.  Her diet is diabetic, heart-healthy diet.  She should increase her activity slowly.  Use a rolling walker.  Follow up with Dr. Wyline Mood in 1 week.  She will be discharged home with [facility], RN, PT, OT, and aide.  The patient was seen and assessed by Physical and Occupational Therapy and Home Health PT and OT were recommended.  DISCHARGE MEDICATIONS: 1. Robitussin DM syrup 5 mL every 4 hours as needed for cough. 2. Prednisone 10-mg tablets, take 30 mg daily, starting June 23, 2010, for 3 days; then 20 mg daily x3 days; then 10 mg daily x3     days; then stop. 3. Combivent 2 puffs daily as needed for shortness of breath as     chronically use. 4. Cyanocobalamin subcutaneous injection 1000 mcg, 1st and 15th of     month. 5. Lasix 40 mg p.o. daily. 6. Iron 1 tablet daily p.o. 7. Humalog 10-15 units subcutaneous t.i.d. with meals. 8. Lantus 44 units subcu q.morning. 9. Potassium chloride 10 mEq p.o. daily. 10.Protonix 40 mg p.o. b.i.d. 11.Symbicort 160/4.5 mcg 1 puff b.i.d. 12.Synthroid 25 mcg p.o. daily. 13.Toprol-XL 50 mg 1/2 tablet p.o. daily. 14.Vitamin C 500 mg p.o. daily. 15.Warfarin 5 mg p.o. daily. 16.Xopenex 45 mcg inhaler 2 puffs every 4 hours as needed for     shortness of breath. 17.Zoloft 100 mg p.o. daily.  Discontinue the following medications, Mucinex.  The patient will continue on Symbicort, Combivent as she has particularly used in the past and Xopenex.  Recommended continue followup with her primary care physician for further treatment of her COPD and other chronic medical issues.  I discussed case with the patient's daughter, [name], who will be coming to pick her up today.  All questions appear to be answered to her satisfaction at this time.  Time coordinating discharge 35 minutes.     Brendia Sacks, MD     DG/MEDQ  D:  06/22/2010  T:  06/22/2010  Job:   [id number]  cc:   Dr. Wyline Mood  Electronically Signed by Brendia Sacks  on 07/02/2010 09:07:05 PM

## 2010-07-08 LAB — CBC
HCT: 27.4 % — ABNORMAL LOW (ref 36.0–46.0)
Hemoglobin: 9.1 g/dL — ABNORMAL LOW (ref 12.0–15.0)
MCH: 25.8 pg — ABNORMAL LOW (ref 26.0–34.0)
MCHC: 33.3 g/dL (ref 30.0–36.0)
RDW: 17.7 % — ABNORMAL HIGH (ref 11.5–15.5)

## 2010-07-08 LAB — COMPREHENSIVE METABOLIC PANEL
AST: 27 U/L (ref 0–37)
Albumin: 3.6 g/dL (ref 3.5–5.2)
BUN: 7 mg/dL (ref 6–23)
CO2: 26 mEq/L (ref 19–32)
Calcium: 7.4 mg/dL — ABNORMAL LOW (ref 8.4–10.5)
Chloride: 96 mEq/L (ref 96–112)
Creatinine, Ser: 0.71 mg/dL (ref 0.4–1.2)
Creatinine, Ser: 1.11 mg/dL (ref 0.4–1.2)
GFR calc Af Amer: 57 mL/min — ABNORMAL LOW (ref >60)
GFR calc non Af Amer: >60 mL/min (ref >60)
Glucose, Bld: 250 mg/dL — ABNORMAL HIGH (ref 70–99)
Total Bilirubin: 0.5 mg/dL (ref 0.3–1.2)

## 2010-07-08 LAB — GLUCOSE, CAPILLARY
Glucose-Capillary: 364 mg/dL — ABNORMAL HIGH (ref 70–99)
Glucose-Capillary: 194 mg/dL — ABNORMAL HIGH (ref 70–99)
Glucose-Capillary: 207 mg/dL — ABNORMAL HIGH (ref 70–99)
Glucose-Capillary: 258 mg/dL — ABNORMAL HIGH (ref 70–99)
Glucose-Capillary: 358 mg/dL — ABNORMAL HIGH (ref 70–99)
Glucose-Capillary: 461 mg/dL — ABNORMAL HIGH (ref 70–99)

## 2010-07-08 LAB — CARDIAC PANEL(CRET KIN+CKTOT+MB+TROPI)
Relative Index: 1.9 (ref 0.0–2.5)
Relative Index: 1.9 (ref 0.0–2.5)
Total CK: 152 U/L (ref 7–177)
Troponin I: 0.01 ng/mL (ref 0.00–0.06)
Troponin I: 0.02 ng/mL (ref 0.00–0.06)
Troponin I: 0.02 ng/mL (ref 0.00–0.06)

## 2010-07-08 LAB — DIFFERENTIAL
Eosinophils Relative: 2 % (ref 0–5)
Lymphocytes Relative: 17 % (ref 12–46)
Monocytes Absolute: 0.3 10*3/uL (ref 0.1–1.0)
Monocytes Relative: 6 % (ref 3–12)
Neutrophils Relative %: 74 % (ref 43–77)

## 2010-07-08 LAB — PROTIME-INR
INR: 1.08 (ref 0.00–1.49)
INR: 1.2 (ref 0.00–1.49)
Prothrombin Time: 14.2 seconds (ref 11.6–15.2)
Prothrombin Time: 15.4 seconds — ABNORMAL HIGH (ref 11.6–15.2)

## 2010-07-08 LAB — POCT CARDIAC MARKERS
CKMB, poc: <1 ng/mL — ABNORMAL LOW (ref 1.0–8.0)
Myoglobin, poc: 57.8 ng/mL (ref 12–200)
Troponin i, poc: <0.05 ng/mL (ref 0.00–0.09)

## 2010-07-16 LAB — GLUCOSE, CAPILLARY
Glucose-Capillary: 101 mg/dL — ABNORMAL HIGH (ref 70–99)
Glucose-Capillary: 103 mg/dL — ABNORMAL HIGH (ref 70–99)
Glucose-Capillary: 107 mg/dL — ABNORMAL HIGH (ref 70–99)
Glucose-Capillary: 109 mg/dL — ABNORMAL HIGH (ref 70–99)
Glucose-Capillary: 111 mg/dL — ABNORMAL HIGH (ref 70–99)
Glucose-Capillary: 120 mg/dL — ABNORMAL HIGH (ref 70–99)
Glucose-Capillary: 133 mg/dL — ABNORMAL HIGH (ref 70–99)
Glucose-Capillary: 155 mg/dL — ABNORMAL HIGH (ref 70–99)
Glucose-Capillary: 155 mg/dL — ABNORMAL HIGH (ref 70–99)
Glucose-Capillary: 159 mg/dL — ABNORMAL HIGH (ref 70–99)
Glucose-Capillary: 164 mg/dL — ABNORMAL HIGH (ref 70–99)
Glucose-Capillary: 168 mg/dL — ABNORMAL HIGH (ref 70–99)
Glucose-Capillary: 201 mg/dL — ABNORMAL HIGH (ref 70–99)
Glucose-Capillary: 213 mg/dL — ABNORMAL HIGH (ref 70–99)
Glucose-Capillary: 216 mg/dL — ABNORMAL HIGH (ref 70–99)
Glucose-Capillary: 221 mg/dL — ABNORMAL HIGH (ref 70–99)
Glucose-Capillary: 248 mg/dL — ABNORMAL HIGH (ref 70–99)
Glucose-Capillary: 285 mg/dL — ABNORMAL HIGH (ref 70–99)
Glucose-Capillary: 340 mg/dL — ABNORMAL HIGH (ref 70–99)
Glucose-Capillary: 486 mg/dL — ABNORMAL HIGH (ref 70–99)
Glucose-Capillary: 57 mg/dL — ABNORMAL LOW (ref 70–99)
Glucose-Capillary: 592 mg/dL (ref 70–99)
Glucose-Capillary: 81 mg/dL (ref 70–99)
Glucose-Capillary: 82 mg/dL (ref 70–99)
Glucose-Capillary: >600 mg/dL (ref 70–99)
Glucose-Capillary: >600 mg/dL (ref 70–99)

## 2010-07-16 LAB — DIFFERENTIAL
Basophils Absolute: 0 10*3/uL (ref 0.0–0.1)
Basophils Absolute: 0 10*3/uL (ref 0.0–0.1)
Basophils Absolute: 0 10*3/uL (ref 0.0–0.1)
Basophils Relative: 0 % (ref 0–1)
Eosinophils Absolute: 0 10*3/uL (ref 0.0–0.7)
Eosinophils Relative: 0 % (ref 0–5)
Eosinophils Relative: 1 % (ref 0–5)
Lymphocytes Relative: 22 % (ref 12–46)
Monocytes Absolute: 0.5 10*3/uL (ref 0.1–1.0)
Monocytes Absolute: 0.7 10*3/uL (ref 0.1–1.0)
Neutro Abs: 4.3 10*3/uL (ref 1.7–7.7)
Neutrophils Relative %: 66 % (ref 43–77)

## 2010-07-16 LAB — LIPID PANEL: Cholesterol: 167 mg/dL (ref 0–200)

## 2010-07-16 LAB — BASIC METABOLIC PANEL
BUN: 11 mg/dL (ref 6–23)
BUN: 21 mg/dL (ref 6–23)
CO2: 24 mEq/L (ref 19–32)
CO2: 28 mEq/L (ref 19–32)
CO2: 29 mEq/L (ref 19–32)
CO2: 32 mEq/L (ref 19–32)
Calcium: 8.4 mg/dL (ref 8.4–10.5)
Calcium: 8.4 mg/dL (ref 8.4–10.5)
Calcium: 8.6 mg/dL (ref 8.4–10.5)
Calcium: 8.7 mg/dL (ref 8.4–10.5)
Chloride: 101 mEq/L (ref 96–112)
Chloride: 102 mEq/L (ref 96–112)
Chloride: 103 mEq/L (ref 96–112)
Creatinine, Ser: 0.87 mg/dL (ref 0.4–1.2)
Creatinine, Ser: 0.99 mg/dL (ref 0.4–1.2)
GFR calc Af Amer: >60 mL/min (ref >60)
GFR calc Af Amer: >60 mL/min (ref >60)
GFR calc Af Amer: >60 mL/min (ref >60)
GFR calc Af Amer: >60 mL/min (ref >60)
GFR calc non Af Amer: >60 mL/min (ref >60)
Glucose, Bld: 128 mg/dL — ABNORMAL HIGH (ref 70–99)
Glucose, Bld: 150 mg/dL — ABNORMAL HIGH (ref 70–99)
Glucose, Bld: 324 mg/dL — ABNORMAL HIGH (ref 70–99)
Glucose, Bld: 88 mg/dL (ref 70–99)
Potassium: 3.5 mEq/L (ref 3.5–5.1)
Potassium: 3.6 mEq/L (ref 3.5–5.1)
Potassium: 4 mEq/L (ref 3.5–5.1)
Potassium: 4 mEq/L (ref 3.5–5.1)
Sodium: 135 mEq/L (ref 135–145)
Sodium: 137 mEq/L (ref 135–145)
Sodium: 141 mEq/L (ref 135–145)
Sodium: 141 mEq/L (ref 135–145)

## 2010-07-16 LAB — CK TOTAL AND CKMB (NOT AT ARMC)
CK, MB: 1.8 ng/mL (ref 0.3–4.0)
Relative Index: INVALID (ref 0.0–2.5)
Relative Index: INVALID (ref 0.0–2.5)
Total CK: 65 U/L (ref 7–177)
Total CK: 74 U/L (ref 7–177)

## 2010-07-16 LAB — PHOSPHORUS
Phosphorus: 3.6 mg/dL (ref 2.3–4.6)
Phosphorus: 4.5 mg/dL (ref 2.3–4.6)

## 2010-07-16 LAB — CBC
HCT: 32.1 % — ABNORMAL LOW (ref 36.0–46.0)
HCT: 34.9 % — ABNORMAL LOW (ref 36.0–46.0)
HCT: 35.1 % — ABNORMAL LOW (ref 36.0–46.0)
Hemoglobin: 11.2 g/dL — ABNORMAL LOW (ref 12.0–15.0)
Hemoglobin: 11.3 g/dL — ABNORMAL LOW (ref 12.0–15.0)
MCHC: 32.1 g/dL (ref 30.0–36.0)
MCHC: 32.9 g/dL (ref 30.0–36.0)
MCHC: 33.3 g/dL (ref 30.0–36.0)
MCHC: 33.6 g/dL (ref 30.0–36.0)
MCV: 77.4 fL — ABNORMAL LOW (ref 78.0–100.0)
MCV: 77.9 fL — ABNORMAL LOW (ref 78.0–100.0)
Platelets: 187 10*3/uL (ref 150–400)
Platelets: 204 10*3/uL (ref 150–400)
Platelets: 233 10*3/uL (ref 150–400)
Platelets: 241 10*3/uL (ref 150–400)
RBC: 4.57 MIL/uL (ref 3.87–5.11)
RDW: 17.5 % — ABNORMAL HIGH (ref 11.5–15.5)
RDW: 17.7 % — ABNORMAL HIGH (ref 11.5–15.5)
RDW: 17.8 % — ABNORMAL HIGH (ref 11.5–15.5)
RDW: 18 % — ABNORMAL HIGH (ref 11.5–15.5)
RDW: 18.1 % — ABNORMAL HIGH (ref 11.5–15.5)
WBC: 5.8 10*3/uL (ref 4.0–10.5)
WBC: 9.2 10*3/uL (ref 4.0–10.5)

## 2010-07-16 LAB — PROTIME-INR
INR: 1.63 — ABNORMAL HIGH (ref 0.00–1.49)
INR: 1.66 — ABNORMAL HIGH (ref 0.00–1.49)
INR: 1.75 — ABNORMAL HIGH (ref 0.00–1.49)
INR: 1.9 — ABNORMAL HIGH (ref 0.00–1.49)
Prothrombin Time: 15.6 seconds — ABNORMAL HIGH (ref 11.6–15.2)
Prothrombin Time: 19.2 seconds — ABNORMAL HIGH (ref 11.6–15.2)
Prothrombin Time: 19.6 seconds — ABNORMAL HIGH (ref 11.6–15.2)
Prothrombin Time: 21.2 seconds — ABNORMAL HIGH (ref 11.6–15.2)
Prothrombin Time: 21.6 seconds — ABNORMAL HIGH (ref 11.6–15.2)

## 2010-07-16 LAB — URINE CULTURE

## 2010-07-16 LAB — APTT: aPTT: 34 seconds (ref 24–37)

## 2010-07-16 LAB — URINE MICROSCOPIC-ADD ON

## 2010-07-16 LAB — TROPONIN I: Troponin I: 0.02 ng/mL (ref 0.00–0.06)

## 2010-07-16 LAB — HEMOGLOBIN A1C
Hgb A1c MFr Bld: 11.7 % — ABNORMAL HIGH (ref 4.6–6.1)
Mean Plasma Glucose: 289 mg/dL

## 2010-07-16 LAB — POCT CARDIAC MARKERS

## 2010-07-16 LAB — URINALYSIS, ROUTINE W REFLEX MICROSCOPIC
Glucose, UA: 500 mg/dL — AB
Hgb urine dipstick: NEGATIVE
Ketones, ur: NEGATIVE mg/dL
Protein, ur: NEGATIVE mg/dL
pH: 6 (ref 5.0–8.0)

## 2010-07-16 LAB — MAGNESIUM: Magnesium: 1.9 mg/dL (ref 1.5–2.5)

## 2010-07-16 LAB — TSH: TSH: 0.828 u[IU]/mL (ref 0.350–4.500)

## 2010-07-16 LAB — T4, FREE: Free T4: 1.36 ng/dL (ref 0.80–1.80)

## 2010-07-16 LAB — BRAIN NATRIURETIC PEPTIDE: Pro B Natriuretic peptide (BNP): 323 pg/mL — ABNORMAL HIGH (ref 0.0–100.0)

## 2010-07-20 LAB — URINALYSIS, ROUTINE W REFLEX MICROSCOPIC
Ketones, ur: NEGATIVE mg/dL
Nitrite: NEGATIVE
pH: 6 (ref 5.0–8.0)

## 2010-07-20 LAB — POCT I-STAT, CHEM 8
HCT: 33 % — ABNORMAL LOW (ref 36.0–46.0)
Hemoglobin: 11.2 g/dL — ABNORMAL LOW (ref 12.0–15.0)
Potassium: 3.9 mEq/L (ref 3.5–5.1)
Sodium: 141 mEq/L (ref 135–145)

## 2010-07-20 LAB — BASIC METABOLIC PANEL
CO2: 32 mEq/L (ref 19–32)
Chloride: 95 mEq/L — ABNORMAL LOW (ref 96–112)
GFR calc Af Amer: 52 mL/min — ABNORMAL LOW (ref >60)
GFR calc Af Amer: 55 mL/min — ABNORMAL LOW (ref >60)
GFR calc non Af Amer: 43 mL/min — ABNORMAL LOW (ref >60)
Glucose, Bld: 240 mg/dL — ABNORMAL HIGH (ref 70–99)
Potassium: 3.5 mEq/L (ref 3.5–5.1)
Potassium: 3.8 mEq/L (ref 3.5–5.1)
Sodium: 135 mEq/L (ref 135–145)
Sodium: 139 mEq/L (ref 135–145)

## 2010-07-20 LAB — CBC
HCT: 31.5 % — ABNORMAL LOW (ref 36.0–46.0)
HCT: 34.8 % — ABNORMAL LOW (ref 36.0–46.0)
Hemoglobin: 10.5 g/dL — ABNORMAL LOW (ref 12.0–15.0)
Hemoglobin: 11.3 g/dL — ABNORMAL LOW (ref 12.0–15.0)
MCHC: 32.3 g/dL (ref 30.0–36.0)
MCV: 79.2 fL (ref 78.0–100.0)
MCV: 79.3 fL (ref 78.0–100.0)
Platelets: 448 10*3/uL — ABNORMAL HIGH (ref 150–400)
RBC: 3.97 MIL/uL (ref 3.87–5.11)
RDW: 18.4 % — ABNORMAL HIGH (ref 11.5–15.5)
RDW: 18.5 % — ABNORMAL HIGH (ref 11.5–15.5)
WBC: 6.6 10*3/uL (ref 4.0–10.5)
WBC: 6.6 10*3/uL (ref 4.0–10.5)

## 2010-07-20 LAB — DIFFERENTIAL
Basophils Absolute: 0 10*3/uL (ref 0.0–0.1)
Lymphocytes Relative: 17 % (ref 12–46)
Lymphs Abs: 1.1 10*3/uL (ref 0.7–4.0)
Monocytes Absolute: 0.6 10*3/uL (ref 0.1–1.0)
Neutro Abs: 4.8 10*3/uL (ref 1.7–7.7)

## 2010-07-20 LAB — GLUCOSE, CAPILLARY
Glucose-Capillary: 130 mg/dL — ABNORMAL HIGH (ref 70–99)
Glucose-Capillary: 141 mg/dL — ABNORMAL HIGH (ref 70–99)
Glucose-Capillary: 168 mg/dL — ABNORMAL HIGH (ref 70–99)
Glucose-Capillary: 215 mg/dL — ABNORMAL HIGH (ref 70–99)
Glucose-Capillary: 233 mg/dL — ABNORMAL HIGH (ref 70–99)
Glucose-Capillary: 281 mg/dL — ABNORMAL HIGH (ref 70–99)
Glucose-Capillary: 322 mg/dL — ABNORMAL HIGH (ref 70–99)
Glucose-Capillary: 324 mg/dL — ABNORMAL HIGH (ref 70–99)
Glucose-Capillary: 395 mg/dL — ABNORMAL HIGH (ref 70–99)
Glucose-Capillary: 584 mg/dL (ref 70–99)

## 2010-07-20 LAB — POCT CARDIAC MARKERS
CKMB, poc: <1 ng/mL — ABNORMAL LOW (ref 1.0–8.0)
Myoglobin, poc: 100 ng/mL (ref 12–200)
Troponin i, poc: <0.05 ng/mL (ref 0.00–0.09)

## 2010-07-20 LAB — RETICULOCYTES
RBC.: 4.33 MIL/uL (ref 3.87–5.11)
Retic Ct Pct: 2.2 % (ref 0.4–3.1)

## 2010-07-20 LAB — BRAIN NATRIURETIC PEPTIDE: Pro B Natriuretic peptide (BNP): 653 pg/mL — ABNORMAL HIGH (ref 0.0–100.0)

## 2010-07-20 LAB — PROTIME-INR
INR: 2.4 — ABNORMAL HIGH (ref 0.00–1.49)
INR: 2.55 — ABNORMAL HIGH (ref 0.00–1.49)
Prothrombin Time: 27.2 seconds — ABNORMAL HIGH (ref 11.6–15.2)
Prothrombin Time: 28.2 seconds — ABNORMAL HIGH (ref 11.6–15.2)

## 2010-07-20 LAB — COMPREHENSIVE METABOLIC PANEL
Alkaline Phosphatase: 81 U/L (ref 39–117)
BUN: 23 mg/dL (ref 6–23)
Calcium: 9.2 mg/dL (ref 8.4–10.5)
Creatinine, Ser: 1.1 mg/dL (ref 0.4–1.2)
Glucose, Bld: 158 mg/dL — ABNORMAL HIGH (ref 70–99)
Total Protein: 6.1 g/dL (ref 6.0–8.3)

## 2010-07-20 LAB — IRON AND TIBC: Saturation Ratios: 3 % — ABNORMAL LOW (ref 20–55)

## 2010-07-24 NOTE — Consult Note (Signed)
[name], [name]                 ACCOUNT NO.:  [id number]  MEDICAL RECORD NO.:  [id number]           PATIENT TYPE:  LOCATION:                                 FACILITY:  PHYSICIAN:  Bevelyn Buckles. Shavonta Gossen, MDDATE OF BIRTH:  11-Mar-1925  DATE OF CONSULTATION:  06/18/2010 DATE OF DISCHARGE:                                CONSULTATION   PRIMARY CARE PHYSICIAN:  Dr. Orvan Falconer in [place], [place].  PRIMARY CARDIOLOGIST:  [name]. Jens Som, MD, [facility].  CHIEF COMPLAINT:  Shortness of breath.  HISTORY OF PRESENT ILLNESS:  [name] is an 77 year old female with a history of coronary artery disease and atrial fibrillation.  She was hospitalized from June 11, 2010, through June 13, 2010, with melena.  She was heme negative and an EGD did not show any acute abnormalities.  Since discharge home, she complains of increased cough and states that her weight went up 6 pounds overnight.  She has no edema.  She has chronic dyspnea on exertion that she feels is worse. She has been wheezing.  She has a productive cough with greenish sputum but denies fever.  A home health nurse saw [name] today and called the office.  When her symptoms were described, she was advised to come to the emergency room where we are evaluating her.  Although, she complains of shortness of breath, she is able to talk in complete sentences without stopping.  She does, however, seem to get more short of breath when she lies flat.  She has not had chest pain.  PAST MEDICAL HISTORY: 1. Status post aortocoronary bypass surgery in 2000 with no further     details available. 2. Status post Myoview in 2009 showing no ischemia and an EF of 43%. 3. Status post echocardiogram May 28, 2010, showing an EF of 45-     50% with severe hypokinesis of the mid distal anteroseptal     myocardium, trivial aortic valvular regurgitation, and mild MR, PAS     44, and moderate left atrial dilatation. 4. Atrial  fibrillation. 5. Chronic diastolic CHF. 6. Remote history of LAA thrombus. 7. Chronic anticoagulation with Coumadin. 8. COPD. 9. Mild dementia. 10.Anemia. 11.COPD/asthma. 12.Hypothyroidism. 13.Diabetes. 14.Obesity. 15.Hypertension. 16.Hyperlipidemia. 17.Family history of coronary artery disease. 18.Remote history of tobacco use. 19.History of von Willebrand's. 20.Diabetic neuropathy. 21.Gastroesophageal reflux disease.  SURGICAL HISTORY:  She is status post cardiac catheterization as well as bypass surgery, hernia repair, appendectomy, jaw reconstruction, AV node ablation with a [employer] An[employer] BiV pacemaker, EGD, and colonoscopy.  ALLERGIES:  Upon review of old records, she is allergic or intolerant to AUGMENTIN, LABETALOL, SULFA, CIPRO, KEFLEX, CEFACLOR, NIACIN, BENTYL, NORMODYNE, BEXTRA, QUINAMM, TRILEPTAL, ZOCOR, LIPITOR, and TEGRETOL.  CURRENT MEDICATIONS:  At discharge June 13, 2010, include 1. Protonix 40 mg b.i.d. 2. Iron daily. 3. Combivent daily p.r.n. 4. Potassium 10 mEq a day. 5. Vitamin B12 two times a month. 6. Lasix 40 mg a day. 7. Mucinex p.r.n. 8. Lantus 44 units q.a.m. 9. Humalog sliding scale t.i.d. a.c. 10.Zoloft 100 mg daily. 11.Xopenex p.r.n. 12.Coumadin 5 mg a day. 13.Vitamin C 5 mg daily.  14.Toprol XL 50 mg 1/2 tablet daily. 15.Synthroid 25 mcg daily 16.Symbicort b.i.d.  SOCIAL HISTORY:  She lives in [place] with family.  She is a retired housewife.  She quit tobacco 25 years ago with approximately 20-pack- year history and denies alcohol or drug abuse.  FAMILY HISTORY:  Her mother died with a stroke but her father had heart disease.  No siblings have history of coronary artery disease.  REVIEW OF SYSTEMS:  She has chronic arthralgias and joint pains.  She has not had anymore melena.  She has occasional reflux symptoms.  She has problems with agitation and anxiety.  Full 14-point review of systems is otherwise negative except as  stated in the HPI.  PHYSICAL EXAMINATION:  VITAL SIGNS:  Temperature is 98.9, blood pressure 120/58, pulse 74, respiratory rate 18, O2 saturation 99% on 2 L. GENERAL:  She is a well-developed, obese white female in no acute distress at rest. HEENT:  Normal for age. NECK:  There is no lymphadenopathy, thyromegaly, or bruits noted.  JVD is at 6 cm but she has a positive hepatojugular reflux. CV:  Her heart is regular in rate and rhythm with an S1, S2 and no significant murmur, rub, or gallop is noted.  Distal pulses are intact in all four extremities. LUNGS:  She has an expiratory wheeze as well as an upper airway wheeze, rales, and rhonchi. SKIN:  She has multiple areas of ecchymosis. ABDOMEN:  Soft and nontender with active bowel sounds. EXTREMITIES:  There is no cyanosis, clubbing, or edema noted. MUSCULOSKELETAL:  There is no joint deformity or effusions and no spine or CVA tenderness. NEUROLOGIC:  She is alert and oriented with cranial nerves II-XII grossly intact.  Chest x-ray shows cardiac enlargement with prominent pulmonary vascularity similar to prior study.  No developing consolidation or airspace disease.  No significant edema.  EKG is AV pacing.  Laboratory values are pending.  IMPRESSION:  [name] was seen today by Dr. Gala Romney, the patient evaluated and the data reviewed.  We suspect the majority of her symptoms are secondary to an upper respiratory infection/bronchitis. She has a productive cough and wheezing but no fever and no pneumonia on chest x-ray.  She has no obvious fluid overload on exam with a CVP of 6. We suggest admission to Internal Medicine and we will check a BNP.  We will follow p.r.n.     Theodore Demark, PA-C   ______________________________ Bevelyn Buckles. Miyana Mordecai, MD    RB/MEDQ  D:  06/18/2010  T:  06/19/2010  Job:  [id number]  Electronically Signed by Theodore Demark PA-C on 06/24/2010 11:51:30 AM Electronically Signed by Arvilla Meres MD on 07/24/2010 06:43:30 PM

## 2010-08-02 LAB — CARDIAC PANEL(CRET KIN+CKTOT+MB+TROPI)
CK, MB: 4.2 ng/mL — ABNORMAL HIGH (ref 0.3–4.0)
CK, MB: 2.8 ng/mL (ref 0.3–4.0)
CK, MB: 3.3 ng/mL (ref 0.3–4.0)
CK, MB: 4.7 ng/mL — ABNORMAL HIGH (ref 0.3–4.0)
CK, MB: 5.3 ng/mL — ABNORMAL HIGH (ref 0.3–4.0)
Relative Index: 2.7 — ABNORMAL HIGH (ref 0.0–2.5)
Relative Index: 3.5 — ABNORMAL HIGH (ref 0.0–2.5)
Relative Index: INVALID (ref 0.0–2.5)
Relative Index: 1.8 (ref 0.0–2.5)
Relative Index: 2.3 (ref 0.0–2.5)
Relative Index: 2.4 (ref 0.0–2.5)
Total CK: 156 U/L (ref 7–177)
Total CK: 172 U/L (ref 7–177)
Total CK: 116 U/L (ref 7–177)
Total CK: 146 U/L (ref 7–177)
Total CK: 182 U/L — ABNORMAL HIGH (ref 7–177)
Total CK: 212 U/L — ABNORMAL HIGH (ref 7–177)
Troponin I: 0.01 ng/mL (ref 0.00–0.06)
Troponin I: 0.03 ng/mL (ref 0.00–0.06)
Troponin I: 0.03 ng/mL (ref 0.00–0.06)
Troponin I: 0.02 ng/mL (ref 0.00–0.06)
Troponin I: 0.02 ng/mL (ref 0.00–0.06)
Troponin I: 0.04 ng/mL (ref 0.00–0.06)

## 2010-08-02 LAB — PROTIME-INR
INR: 2.1 — ABNORMAL HIGH (ref 0.00–1.49)
INR: 2.2 — ABNORMAL HIGH (ref 0.00–1.49)
INR: 2.2 — ABNORMAL HIGH (ref 0.00–1.49)
INR: 2.6 — ABNORMAL HIGH (ref 0.00–1.49)
INR: 3 — ABNORMAL HIGH (ref 0.00–1.49)
INR: 3.1 — ABNORMAL HIGH (ref 0.00–1.49)
INR: 1.2 (ref 0.00–1.49)
Prothrombin Time: 23.5 seconds — ABNORMAL HIGH (ref 11.6–15.2)
Prothrombin Time: 23.9 seconds — ABNORMAL HIGH (ref 11.6–15.2)
Prothrombin Time: 23.9 seconds — ABNORMAL HIGH (ref 11.6–15.2)
Prothrombin Time: 27.7 seconds — ABNORMAL HIGH (ref 11.6–15.2)
Prothrombin Time: 30.9 seconds — ABNORMAL HIGH (ref 11.6–15.2)
Prothrombin Time: 32 seconds — ABNORMAL HIGH (ref 11.6–15.2)
Prothrombin Time: 14.8 seconds (ref 11.6–15.2)
Prothrombin Time: 17.4 seconds — ABNORMAL HIGH (ref 11.6–15.2)
Prothrombin Time: 20.2 seconds — ABNORMAL HIGH (ref 11.6–15.2)

## 2010-08-02 LAB — BASIC METABOLIC PANEL
BUN: 25 mg/dL — ABNORMAL HIGH (ref 6–23)
BUN: 27 mg/dL — ABNORMAL HIGH (ref 6–23)
BUN: 29 mg/dL — ABNORMAL HIGH (ref 6–23)
BUN: 30 mg/dL — ABNORMAL HIGH (ref 6–23)
BUN: 30 mg/dL — ABNORMAL HIGH (ref 6–23)
BUN: 10 mg/dL (ref 6–23)
BUN: 25 mg/dL — ABNORMAL HIGH (ref 6–23)
CO2: 29 mEq/L (ref 19–32)
CO2: 31 mEq/L (ref 19–32)
CO2: 32 mEq/L (ref 19–32)
CO2: 27 mEq/L (ref 19–32)
CO2: 30 mEq/L (ref 19–32)
Calcium: 8.8 mg/dL (ref 8.4–10.5)
Calcium: 9 mg/dL (ref 8.4–10.5)
Calcium: 9.2 mg/dL (ref 8.4–10.5)
Chloride: 101 mEq/L (ref 96–112)
Chloride: 102 mEq/L (ref 96–112)
Chloride: 95 mEq/L — ABNORMAL LOW (ref 96–112)
Chloride: 103 mEq/L (ref 96–112)
Chloride: 104 mEq/L (ref 96–112)
Chloride: 97 mEq/L (ref 96–112)
Creatinine, Ser: 1.02 mg/dL (ref 0.4–1.2)
Creatinine, Ser: 1.06 mg/dL (ref 0.4–1.2)
Creatinine, Ser: 1.17 mg/dL (ref 0.4–1.2)
Creatinine, Ser: 1.4 mg/dL — ABNORMAL HIGH (ref 0.4–1.2)
Creatinine, Ser: 1.08 mg/dL (ref 0.4–1.2)
GFR calc Af Amer: 44 mL/min — ABNORMAL LOW (ref >60)
GFR calc Af Amer: >60 mL/min (ref >60)
GFR calc Af Amer: 59 mL/min — ABNORMAL LOW (ref >60)
GFR calc non Af Amer: 44 mL/min — ABNORMAL LOW (ref >60)
GFR calc non Af Amer: 49 mL/min — ABNORMAL LOW (ref >60)
Glucose, Bld: 174 mg/dL — ABNORMAL HIGH (ref 70–99)
Glucose, Bld: 193 mg/dL — ABNORMAL HIGH (ref 70–99)
Glucose, Bld: 333 mg/dL — ABNORMAL HIGH (ref 70–99)
Potassium: 3.4 mEq/L — ABNORMAL LOW (ref 3.5–5.1)
Potassium: 3.8 mEq/L (ref 3.5–5.1)
Potassium: 4.5 mEq/L (ref 3.5–5.1)
Potassium: 4.8 mEq/L (ref 3.5–5.1)
Potassium: 5.3 mEq/L — ABNORMAL HIGH (ref 3.5–5.1)
Sodium: 138 mEq/L (ref 135–145)
Sodium: 140 mEq/L (ref 135–145)

## 2010-08-02 LAB — GLUCOSE, CAPILLARY
Glucose-Capillary: 108 mg/dL — ABNORMAL HIGH (ref 70–99)
Glucose-Capillary: 110 mg/dL — ABNORMAL HIGH (ref 70–99)
Glucose-Capillary: 116 mg/dL — ABNORMAL HIGH (ref 70–99)
Glucose-Capillary: 118 mg/dL — ABNORMAL HIGH (ref 70–99)
Glucose-Capillary: 122 mg/dL — ABNORMAL HIGH (ref 70–99)
Glucose-Capillary: 145 mg/dL — ABNORMAL HIGH (ref 70–99)
Glucose-Capillary: 147 mg/dL — ABNORMAL HIGH (ref 70–99)
Glucose-Capillary: 156 mg/dL — ABNORMAL HIGH (ref 70–99)
Glucose-Capillary: 180 mg/dL — ABNORMAL HIGH (ref 70–99)
Glucose-Capillary: 184 mg/dL — ABNORMAL HIGH (ref 70–99)
Glucose-Capillary: 202 mg/dL — ABNORMAL HIGH (ref 70–99)
Glucose-Capillary: 214 mg/dL — ABNORMAL HIGH (ref 70–99)
Glucose-Capillary: 229 mg/dL — ABNORMAL HIGH (ref 70–99)
Glucose-Capillary: 238 mg/dL — ABNORMAL HIGH (ref 70–99)
Glucose-Capillary: 247 mg/dL — ABNORMAL HIGH (ref 70–99)
Glucose-Capillary: 249 mg/dL — ABNORMAL HIGH (ref 70–99)
Glucose-Capillary: 261 mg/dL — ABNORMAL HIGH (ref 70–99)
Glucose-Capillary: 287 mg/dL — ABNORMAL HIGH (ref 70–99)
Glucose-Capillary: 310 mg/dL — ABNORMAL HIGH (ref 70–99)
Glucose-Capillary: 375 mg/dL — ABNORMAL HIGH (ref 70–99)
Glucose-Capillary: 380 mg/dL — ABNORMAL HIGH (ref 70–99)
Glucose-Capillary: 445 mg/dL — ABNORMAL HIGH (ref 70–99)
Glucose-Capillary: 462 mg/dL — ABNORMAL HIGH (ref 70–99)
Glucose-Capillary: 52 mg/dL — ABNORMAL LOW (ref 70–99)
Glucose-Capillary: 59 mg/dL — ABNORMAL LOW (ref 70–99)
Glucose-Capillary: 65 mg/dL — ABNORMAL LOW (ref 70–99)
Glucose-Capillary: 84 mg/dL (ref 70–99)
Glucose-Capillary: 272 mg/dL — ABNORMAL HIGH (ref 70–99)
Glucose-Capillary: 296 mg/dL — ABNORMAL HIGH (ref 70–99)
Glucose-Capillary: 332 mg/dL — ABNORMAL HIGH (ref 70–99)
Glucose-Capillary: 339 mg/dL — ABNORMAL HIGH (ref 70–99)
Glucose-Capillary: 345 mg/dL — ABNORMAL HIGH (ref 70–99)
Glucose-Capillary: 371 mg/dL — ABNORMAL HIGH (ref 70–99)
Glucose-Capillary: 402 mg/dL — ABNORMAL HIGH (ref 70–99)

## 2010-08-02 LAB — URINALYSIS, ROUTINE W REFLEX MICROSCOPIC
Bilirubin Urine: NEGATIVE
Ketones, ur: NEGATIVE mg/dL
Nitrite: NEGATIVE
Protein, ur: NEGATIVE mg/dL
Urobilinogen, UA: 0.2 mg/dL (ref 0.0–1.0)

## 2010-08-02 LAB — LIPID PANEL
LDL Cholesterol: 61 mg/dL (ref 0–99)
Triglycerides: 134 mg/dL (ref <150)

## 2010-08-02 LAB — CBC
HCT: 34.9 % — ABNORMAL LOW (ref 36.0–46.0)
HCT: 39.1 % (ref 36.0–46.0)
HCT: 39.8 % (ref 36.0–46.0)
HCT: 40.2 % (ref 36.0–46.0)
HCT: 43.5 % (ref 36.0–46.0)
HCT: 35.9 % — ABNORMAL LOW (ref 36.0–46.0)
HCT: 36.9 % (ref 36.0–46.0)
Hemoglobin: 12.9 g/dL (ref 12.0–15.0)
Hemoglobin: 13.4 g/dL (ref 12.0–15.0)
Hemoglobin: 15.4 g/dL — ABNORMAL HIGH (ref 12.0–15.0)
MCHC: 33.2 g/dL (ref 30.0–36.0)
MCHC: 33.5 g/dL (ref 30.0–36.0)
MCHC: 33.5 g/dL (ref 30.0–36.0)
MCHC: 33.6 g/dL (ref 30.0–36.0)
MCHC: 33.8 g/dL (ref 30.0–36.0)
MCV: 87.8 fL (ref 78.0–100.0)
MCV: 88.5 fL (ref 78.0–100.0)
MCV: 88.6 fL (ref 78.0–100.0)
MCV: 88.7 fL (ref 78.0–100.0)
MCV: 89.1 fL (ref 78.0–100.0)
MCV: 87.2 fL (ref 78.0–100.0)
MCV: 87.2 fL (ref 78.0–100.0)
MCV: 88 fL (ref 78.0–100.0)
Platelets: 222 10*3/uL (ref 150–400)
Platelets: 244 10*3/uL (ref 150–400)
Platelets: 245 10*3/uL (ref 150–400)
Platelets: 245 10*3/uL (ref 150–400)
Platelets: 296 10*3/uL (ref 150–400)
Platelets: 313 10*3/uL (ref 150–400)
Platelets: 188 10*3/uL (ref 150–400)
Platelets: 206 10*3/uL (ref 150–400)
RBC: 4.25 MIL/uL (ref 3.87–5.11)
RBC: 4.32 MIL/uL (ref 3.87–5.11)
RBC: 4.41 MIL/uL (ref 3.87–5.11)
RBC: 5.09 MIL/uL (ref 3.87–5.11)
RBC: 5.16 MIL/uL — ABNORMAL HIGH (ref 3.87–5.11)
RBC: 4.48 MIL/uL (ref 3.87–5.11)
RDW: 15.9 % — ABNORMAL HIGH (ref 11.5–15.5)
RDW: 16.2 % — ABNORMAL HIGH (ref 11.5–15.5)
RDW: 16.2 % — ABNORMAL HIGH (ref 11.5–15.5)
RDW: 16.3 % — ABNORMAL HIGH (ref 11.5–15.5)
RDW: 16.3 % — ABNORMAL HIGH (ref 11.5–15.5)
RDW: 16.5 % — ABNORMAL HIGH (ref 11.5–15.5)
WBC: 10.1 10*3/uL (ref 4.0–10.5)
WBC: 11.4 10*3/uL — ABNORMAL HIGH (ref 4.0–10.5)
WBC: 13.3 10*3/uL — ABNORMAL HIGH (ref 4.0–10.5)
WBC: 13.3 10*3/uL — ABNORMAL HIGH (ref 4.0–10.5)
WBC: 8.4 10*3/uL (ref 4.0–10.5)
WBC: 12.6 10*3/uL — ABNORMAL HIGH (ref 4.0–10.5)
WBC: 5.4 10*3/uL (ref 4.0–10.5)
WBC: 9.6 10*3/uL (ref 4.0–10.5)

## 2010-08-02 LAB — DIFFERENTIAL
Eosinophils Absolute: 0 10*3/uL (ref 0.0–0.7)
Eosinophils Absolute: 0.1 10*3/uL (ref 0.0–0.7)
Eosinophils Relative: 0 % (ref 0–5)
Eosinophils Relative: 1 % (ref 0–5)
Lymphs Abs: 0.9 10*3/uL (ref 0.7–4.0)
Lymphs Abs: 1.4 10*3/uL (ref 0.7–4.0)
Monocytes Absolute: 0.4 10*3/uL (ref 0.1–1.0)
Monocytes Relative: 4 % (ref 3–12)
Monocytes Relative: 8 % (ref 3–12)

## 2010-08-02 LAB — CK TOTAL AND CKMB (NOT AT ARMC)
CK, MB: 2.1 ng/mL (ref 0.3–4.0)
Total CK: 82 U/L (ref 7–177)

## 2010-08-02 LAB — HEPARIN LEVEL (UNFRACTIONATED)
Heparin Unfractionated: 0.4 IU/mL (ref 0.30–0.70)
Heparin Unfractionated: 0.45 IU/mL (ref 0.30–0.70)
Heparin Unfractionated: 1.36 IU/mL — ABNORMAL HIGH (ref 0.30–0.70)
Heparin Unfractionated: 0.58 IU/mL (ref 0.30–0.70)
Heparin Unfractionated: 0.61 IU/mL (ref 0.30–0.70)

## 2010-08-02 LAB — POCT CARDIAC MARKERS
CKMB, poc: <1 ng/mL — ABNORMAL LOW (ref 1.0–8.0)
Myoglobin, poc: 48.5 ng/mL (ref 12–200)

## 2010-08-02 LAB — TSH: TSH: 0.331 u[IU]/mL — ABNORMAL LOW (ref 0.350–4.500)

## 2010-08-02 LAB — HEMOGLOBIN A1C
Hgb A1c MFr Bld: 10 % — ABNORMAL HIGH (ref 4.6–6.1)
Mean Plasma Glucose: 240 mg/dL

## 2010-08-04 LAB — GLUCOSE, CAPILLARY
Glucose-Capillary: 268 mg/dL — ABNORMAL HIGH (ref 70–99)
Glucose-Capillary: 171 mg/dL — ABNORMAL HIGH (ref 70–99)
Glucose-Capillary: 185 mg/dL — ABNORMAL HIGH (ref 70–99)
Glucose-Capillary: 207 mg/dL — ABNORMAL HIGH (ref 70–99)
Glucose-Capillary: 267 mg/dL — ABNORMAL HIGH (ref 70–99)
Glucose-Capillary: 294 mg/dL — ABNORMAL HIGH (ref 70–99)
Glucose-Capillary: 317 mg/dL — ABNORMAL HIGH (ref 70–99)
Glucose-Capillary: 361 mg/dL — ABNORMAL HIGH (ref 70–99)
Glucose-Capillary: 399 mg/dL — ABNORMAL HIGH (ref 70–99)
Glucose-Capillary: 445 mg/dL — ABNORMAL HIGH (ref 70–99)

## 2010-08-04 LAB — CBC
HCT: 37.9 % (ref 36.0–46.0)
HCT: 38 % (ref 36.0–46.0)
HCT: 41.2 % (ref 36.0–46.0)
Hemoglobin: 12.4 g/dL (ref 12.0–15.0)
Hemoglobin: 12.9 g/dL (ref 12.0–15.0)
Hemoglobin: 13.3 g/dL (ref 12.0–15.0)
MCHC: 32.6 g/dL (ref 30.0–36.0)
MCHC: 33.6 g/dL (ref 30.0–36.0)
MCHC: 33.8 g/dL (ref 30.0–36.0)
MCV: 84.8 fL (ref 78.0–100.0)
MCV: 85.7 fL (ref 78.0–100.0)
Platelets: 188 10*3/uL (ref 150–400)
Platelets: 207 10*3/uL (ref 150–400)
Platelets: 211 10*3/uL (ref 150–400)
Platelets: 229 10*3/uL (ref 150–400)
RBC: 4.46 MIL/uL (ref 3.87–5.11)
RBC: 4.63 MIL/uL (ref 3.87–5.11)
RBC: 4.81 MIL/uL (ref 3.87–5.11)
RDW: 17.8 % — ABNORMAL HIGH (ref 11.5–15.5)
RDW: 17.1 % — ABNORMAL HIGH (ref 11.5–15.5)
RDW: 17.7 % — ABNORMAL HIGH (ref 11.5–15.5)
WBC: 10.9 10*3/uL — ABNORMAL HIGH (ref 4.0–10.5)
WBC: 6.4 10*3/uL (ref 4.0–10.5)
WBC: 6.6 10*3/uL (ref 4.0–10.5)
WBC: 9.9 10*3/uL (ref 4.0–10.5)

## 2010-08-04 LAB — BASIC METABOLIC PANEL
BUN: 13 mg/dL (ref 6–23)
BUN: 21 mg/dL (ref 6–23)
BUN: 29 mg/dL — ABNORMAL HIGH (ref 6–23)
BUN: 9 mg/dL (ref 6–23)
CO2: 27 mEq/L (ref 19–32)
CO2: 29 mEq/L (ref 19–32)
Calcium: 9.2 mg/dL (ref 8.4–10.5)
Calcium: 9.3 mg/dL (ref 8.4–10.5)
Calcium: 9.5 mg/dL (ref 8.4–10.5)
Chloride: 106 mEq/L (ref 96–112)
Chloride: 98 mEq/L (ref 96–112)
Chloride: 98 mEq/L (ref 96–112)
Chloride: 98 mEq/L (ref 96–112)
Creatinine, Ser: 0.87 mg/dL (ref 0.4–1.2)
Creatinine, Ser: 0.89 mg/dL (ref 0.4–1.2)
GFR calc Af Amer: >60 mL/min (ref >60)
GFR calc Af Amer: >60 mL/min (ref >60)
GFR calc non Af Amer: 49 mL/min — ABNORMAL LOW (ref >60)
GFR calc non Af Amer: 56 mL/min — ABNORMAL LOW (ref >60)
GFR calc non Af Amer: >60 mL/min (ref >60)
Glucose, Bld: 210 mg/dL — ABNORMAL HIGH (ref 70–99)
Glucose, Bld: 250 mg/dL — ABNORMAL HIGH (ref 70–99)
Glucose, Bld: 497 mg/dL — ABNORMAL HIGH (ref 70–99)
Potassium: 4.2 mEq/L (ref 3.5–5.1)
Potassium: 4.7 mEq/L (ref 3.5–5.1)
Potassium: 4.8 mEq/L (ref 3.5–5.1)
Potassium: 5.1 mEq/L (ref 3.5–5.1)
Sodium: 133 mEq/L — ABNORMAL LOW (ref 135–145)
Sodium: 136 mEq/L (ref 135–145)

## 2010-08-04 LAB — DIFFERENTIAL
Basophils Absolute: 0 10*3/uL (ref 0.0–0.1)
Basophils Absolute: 0 10*3/uL (ref 0.0–0.1)
Basophils Relative: 1 % (ref 0–1)
Basophils Relative: 1 % (ref 0–1)
Basophils Relative: 1 % (ref 0–1)
Eosinophils Absolute: 0.1 10*3/uL (ref 0.0–0.7)
Eosinophils Relative: 2 % (ref 0–5)
Lymphocytes Relative: 18 % (ref 12–46)
Lymphocytes Relative: 35 % (ref 12–46)
Lymphs Abs: 1.7 10*3/uL (ref 0.7–4.0)
Lymphs Abs: 2 10*3/uL (ref 0.7–4.0)
Monocytes Absolute: 0.6 10*3/uL (ref 0.1–1.0)
Monocytes Relative: 9 % (ref 3–12)
Monocytes Relative: 11 % (ref 3–12)
Monocytes Relative: 9 % (ref 3–12)
Neutro Abs: 3.2 10*3/uL (ref 1.7–7.7)
Neutro Abs: 3.4 10*3/uL (ref 1.7–7.7)
Neutro Abs: 8.2 10*3/uL — ABNORMAL HIGH (ref 1.7–7.7)
Neutrophils Relative %: 58 % (ref 43–77)
Neutrophils Relative %: 52 % (ref 43–77)
Neutrophils Relative %: 52 % (ref 43–77)
Neutrophils Relative %: 75 % (ref 43–77)

## 2010-08-04 LAB — COMPREHENSIVE METABOLIC PANEL
Alkaline Phosphatase: 79 U/L (ref 39–117)
BUN: 8 mg/dL (ref 6–23)
Calcium: 9 mg/dL (ref 8.4–10.5)
Glucose, Bld: 145 mg/dL — ABNORMAL HIGH (ref 70–99)
Total Protein: 5.7 g/dL — ABNORMAL LOW (ref 6.0–8.3)

## 2010-08-04 LAB — PROTIME-INR
INR: 1.8 — ABNORMAL HIGH (ref 0.00–1.49)
INR: 1.5 (ref 0.00–1.49)
INR: 1.9 — ABNORMAL HIGH (ref 0.00–1.49)
INR: 2.1 — ABNORMAL HIGH (ref 0.00–1.49)
INR: 2.2 — ABNORMAL HIGH (ref 0.00–1.49)
INR: 2.4 — ABNORMAL HIGH (ref 0.00–1.49)
INR: 2.5 — ABNORMAL HIGH (ref 0.00–1.49)
Prothrombin Time: 22.2 seconds — ABNORMAL HIGH (ref 11.6–15.2)
Prothrombin Time: 26.2 seconds — ABNORMAL HIGH (ref 11.6–15.2)
Prothrombin Time: 28.1 seconds — ABNORMAL HIGH (ref 11.6–15.2)

## 2010-08-04 LAB — CARDIAC PANEL(CRET KIN+CKTOT+MB+TROPI)
CK, MB: 1.7 ng/mL (ref 0.3–4.0)
Relative Index: 1.8 (ref 0.0–2.5)
Total CK: 112 U/L (ref 7–177)
Troponin I: 0.02 ng/mL (ref 0.00–0.06)

## 2010-08-04 LAB — URINALYSIS, ROUTINE W REFLEX MICROSCOPIC
Bilirubin Urine: NEGATIVE
Glucose, UA: NEGATIVE mg/dL
Hgb urine dipstick: NEGATIVE
Hgb urine dipstick: NEGATIVE
Ketones, ur: NEGATIVE mg/dL
Ketones, ur: NEGATIVE mg/dL
Protein, ur: NEGATIVE mg/dL
Protein, ur: NEGATIVE mg/dL
Urobilinogen, UA: 0.2 mg/dL (ref 0.0–1.0)

## 2010-08-04 LAB — LIPASE, BLOOD: Lipase: 29 U/L (ref 11–59)

## 2010-08-04 LAB — TROPONIN I: Troponin I: 0.02 ng/mL (ref 0.00–0.06)

## 2010-08-04 LAB — LIPID PANEL
Cholesterol: 127 mg/dL (ref 0–200)
LDL Cholesterol: 43 mg/dL (ref 0–99)
Triglycerides: 159 mg/dL — ABNORMAL HIGH (ref <150)

## 2010-08-04 LAB — BRAIN NATRIURETIC PEPTIDE
Pro B Natriuretic peptide (BNP): 471 pg/mL — ABNORMAL HIGH (ref 0.0–100.0)
Pro B Natriuretic peptide (BNP): 471 pg/mL — ABNORMAL HIGH (ref 0.0–100.0)

## 2010-08-04 LAB — POCT CARDIAC MARKERS
CKMB, poc: 1.3 ng/mL (ref 1.0–8.0)
CKMB, poc: <1 ng/mL — ABNORMAL LOW (ref 1.0–8.0)
Myoglobin, poc: 59 ng/mL (ref 12–200)
Myoglobin, poc: 101 ng/mL (ref 12–200)
Myoglobin, poc: 64.9 ng/mL (ref 12–200)

## 2010-08-04 LAB — APTT: aPTT: 40 seconds — ABNORMAL HIGH (ref 24–37)

## 2010-08-04 LAB — URINE MICROSCOPIC-ADD ON

## 2010-08-04 LAB — CK TOTAL AND CKMB (NOT AT ARMC)
CK, MB: 1.8 ng/mL (ref 0.3–4.0)
Relative Index: 1.5 (ref 0.0–2.5)
Total CK: 120 U/L (ref 7–177)

## 2010-08-04 LAB — TSH: TSH: 3.729 u[IU]/mL (ref 0.350–4.500)

## 2010-08-06 LAB — CROSSMATCH: Antibody Screen: POSITIVE

## 2010-08-06 LAB — DIFFERENTIAL
Basophils Absolute: 0 10*3/uL (ref 0.0–0.1)
Basophils Absolute: 0 10*3/uL (ref 0.0–0.1)
Basophils Absolute: 0 10*3/uL (ref 0.0–0.1)
Basophils Absolute: 0 10*3/uL (ref 0.0–0.1)
Basophils Absolute: 0.1 10*3/uL (ref 0.0–0.1)
Basophils Absolute: 0 10*3/uL (ref 0.0–0.1)
Basophils Absolute: 0.1 10*3/uL (ref 0.0–0.1)
Basophils Relative: 0 % (ref 0–1)
Basophils Relative: 0 % (ref 0–1)
Basophils Relative: 1 % (ref 0–1)
Eosinophils Absolute: 0.1 10*3/uL (ref 0.0–0.7)
Eosinophils Absolute: 0.2 10*3/uL (ref 0.0–0.7)
Eosinophils Absolute: 0.1 10*3/uL (ref 0.0–0.7)
Eosinophils Relative: 3 % (ref 0–5)
Eosinophils Relative: 4 % (ref 0–5)
Eosinophils Relative: 2 % (ref 0–5)
Lymphocytes Relative: 12 % (ref 12–46)
Lymphocytes Relative: 28 % (ref 12–46)
Lymphocytes Relative: 30 % (ref 12–46)
Lymphocytes Relative: 24 % (ref 12–46)
Lymphs Abs: 1.6 10*3/uL (ref 0.7–4.0)
Lymphs Abs: 2.1 10*3/uL (ref 0.7–4.0)
Lymphs Abs: 2.3 10*3/uL (ref 0.7–4.0)
Monocytes Absolute: 0.5 10*3/uL (ref 0.1–1.0)
Monocytes Absolute: 0.6 10*3/uL (ref 0.1–1.0)
Monocytes Absolute: 0.7 10*3/uL (ref 0.1–1.0)
Monocytes Absolute: 0.8 10*3/uL (ref 0.1–1.0)
Monocytes Relative: 10 % (ref 3–12)
Monocytes Relative: 6 % (ref 3–12)
Monocytes Relative: 7 % (ref 3–12)
Neutro Abs: 2.9 10*3/uL (ref 1.7–7.7)
Neutro Abs: 3.5 10*3/uL (ref 1.7–7.7)
Neutro Abs: 4 10*3/uL (ref 1.7–7.7)
Neutro Abs: 6.3 10*3/uL (ref 1.7–7.7)
Neutro Abs: 7.7 10*3/uL (ref 1.7–7.7)
Neutro Abs: 5.4 10*3/uL (ref 1.7–7.7)
Neutrophils Relative %: 59 % (ref 43–77)
Neutrophils Relative %: 65 % (ref 43–77)
Neutrophils Relative %: 67 % (ref 43–77)
Neutrophils Relative %: 85 % — ABNORMAL HIGH (ref 43–77)
Neutrophils Relative %: 64 % (ref 43–77)

## 2010-08-06 LAB — COMPREHENSIVE METABOLIC PANEL
ALT: 51 U/L — ABNORMAL HIGH (ref 0–35)
ALT: 86 U/L — ABNORMAL HIGH (ref 0–35)
ALT: 95 U/L — ABNORMAL HIGH (ref 0–35)
ALT: 24 U/L (ref 0–35)
AST: 120 U/L — ABNORMAL HIGH (ref 0–37)
AST: 29 U/L (ref 0–37)
Albumin: 2.9 g/dL — ABNORMAL LOW (ref 3.5–5.2)
Albumin: 2.9 g/dL — ABNORMAL LOW (ref 3.5–5.2)
Albumin: 3 g/dL — ABNORMAL LOW (ref 3.5–5.2)
Albumin: 3.1 g/dL — ABNORMAL LOW (ref 3.5–5.2)
Alkaline Phosphatase: 117 U/L (ref 39–117)
Alkaline Phosphatase: 119 U/L — ABNORMAL HIGH (ref 39–117)
BUN: 22 mg/dL (ref 6–23)
BUN: 22 mg/dL (ref 6–23)
BUN: 27 mg/dL — ABNORMAL HIGH (ref 6–23)
BUN: 28 mg/dL — ABNORMAL HIGH (ref 6–23)
BUN: 42 mg/dL — ABNORMAL HIGH (ref 6–23)
CO2: 28 mEq/L (ref 19–32)
CO2: 29 mEq/L (ref 19–32)
CO2: 29 mEq/L (ref 19–32)
Calcium: 9.1 mg/dL (ref 8.4–10.5)
Calcium: 9.3 mg/dL (ref 8.4–10.5)
Chloride: 100 mEq/L (ref 96–112)
Chloride: 105 mEq/L (ref 96–112)
Chloride: 109 mEq/L (ref 96–112)
Chloride: 102 mEq/L (ref 96–112)
Chloride: 107 mEq/L (ref 96–112)
Creatinine, Ser: 1.04 mg/dL (ref 0.4–1.2)
Creatinine, Ser: 1.17 mg/dL (ref 0.4–1.2)
Creatinine, Ser: 1.57 mg/dL — ABNORMAL HIGH (ref 0.4–1.2)
GFR calc Af Amer: >60 mL/min (ref >60)
GFR calc Af Amer: >60 mL/min (ref >60)
GFR calc non Af Amer: 44 mL/min — ABNORMAL LOW (ref >60)
GFR calc non Af Amer: 48 mL/min — ABNORMAL LOW (ref >60)
GFR calc non Af Amer: 57 mL/min — ABNORMAL LOW (ref >60)
Glucose, Bld: 180 mg/dL — ABNORMAL HIGH (ref 70–99)
Glucose, Bld: 199 mg/dL — ABNORMAL HIGH (ref 70–99)
Glucose, Bld: 250 mg/dL — ABNORMAL HIGH (ref 70–99)
Potassium: 4.1 mEq/L (ref 3.5–5.1)
Potassium: 4.7 mEq/L (ref 3.5–5.1)
Sodium: 134 mEq/L — ABNORMAL LOW (ref 135–145)
Sodium: 140 mEq/L (ref 135–145)
Sodium: 141 mEq/L (ref 135–145)
Sodium: 137 mEq/L (ref 135–145)
Total Bilirubin: 0.3 mg/dL (ref 0.3–1.2)
Total Bilirubin: 0.7 mg/dL (ref 0.3–1.2)
Total Bilirubin: 0.6 mg/dL (ref 0.3–1.2)
Total Bilirubin: 0.6 mg/dL (ref 0.3–1.2)
Total Protein: 5.5 g/dL — ABNORMAL LOW (ref 6.0–8.3)
Total Protein: 5.5 g/dL — ABNORMAL LOW (ref 6.0–8.3)
Total Protein: 5.6 g/dL — ABNORMAL LOW (ref 6.0–8.3)

## 2010-08-06 LAB — CBC
HCT: 35.7 % — ABNORMAL LOW (ref 36.0–46.0)
HCT: 36.1 % (ref 36.0–46.0)
HCT: 36.9 % (ref 36.0–46.0)
HCT: 38 % (ref 36.0–46.0)
HCT: 38.4 % (ref 36.0–46.0)
HCT: 36.3 % (ref 36.0–46.0)
Hemoglobin: 11.4 g/dL — ABNORMAL LOW (ref 12.0–15.0)
Hemoglobin: 12.8 g/dL (ref 12.0–15.0)
Hemoglobin: 12.8 g/dL (ref 12.0–15.0)
Hemoglobin: 14.3 g/dL (ref 12.0–15.0)
MCHC: 33.1 g/dL (ref 30.0–36.0)
MCHC: 33.2 g/dL (ref 30.0–36.0)
MCHC: 33.3 g/dL (ref 30.0–36.0)
MCHC: 33.5 g/dL (ref 30.0–36.0)
MCHC: 33.7 g/dL (ref 30.0–36.0)
MCHC: 33.9 g/dL (ref 30.0–36.0)
MCV: 86.3 fL (ref 78.0–100.0)
MCV: 86.4 fL (ref 78.0–100.0)
MCV: 86.8 fL (ref 78.0–100.0)
MCV: 86.8 fL (ref 78.0–100.0)
MCV: 86.9 fL (ref 78.0–100.0)
MCV: 87.1 fL (ref 78.0–100.0)
MCV: 87.5 fL (ref 78.0–100.0)
MCV: 86.4 fL (ref 78.0–100.0)
Platelets: 175 10*3/uL (ref 150–400)
Platelets: 179 10*3/uL (ref 150–400)
Platelets: 183 10*3/uL (ref 150–400)
Platelets: 198 10*3/uL (ref 150–400)
Platelets: 199 10*3/uL (ref 150–400)
Platelets: 206 10*3/uL (ref 150–400)
Platelets: 213 10*3/uL (ref 150–400)
Platelets: 252 10*3/uL (ref 150–400)
Platelets: 218 10*3/uL (ref 150–400)
RBC: 4.02 MIL/uL (ref 3.87–5.11)
RBC: 4.16 MIL/uL (ref 3.87–5.11)
RBC: 4.42 MIL/uL (ref 3.87–5.11)
RBC: 4.93 MIL/uL (ref 3.87–5.11)
RBC: 4.19 MIL/uL (ref 3.87–5.11)
RDW: 14.6 % (ref 11.5–15.5)
RDW: 15 % (ref 11.5–15.5)
RDW: 15.1 % (ref 11.5–15.5)
RDW: 15.1 % (ref 11.5–15.5)
RDW: 15.2 % (ref 11.5–15.5)
RDW: 14.7 % (ref 11.5–15.5)
WBC: 5.1 10*3/uL (ref 4.0–10.5)
WBC: 5.2 10*3/uL (ref 4.0–10.5)
WBC: 9 10*3/uL (ref 4.0–10.5)
WBC: 9.3 10*3/uL (ref 4.0–10.5)
WBC: 6.1 10*3/uL (ref 4.0–10.5)
WBC: 8.4 10*3/uL (ref 4.0–10.5)

## 2010-08-06 LAB — POCT CARDIAC MARKERS

## 2010-08-06 LAB — BASIC METABOLIC PANEL
BUN: 21 mg/dL (ref 6–23)
BUN: 23 mg/dL (ref 6–23)
BUN: 31 mg/dL — ABNORMAL HIGH (ref 6–23)
BUN: 31 mg/dL — ABNORMAL HIGH (ref 6–23)
CO2: 32 mEq/L (ref 19–32)
Calcium: 8.3 mg/dL — ABNORMAL LOW (ref 8.4–10.5)
Calcium: 8.7 mg/dL (ref 8.4–10.5)
Calcium: 8.8 mg/dL (ref 8.4–10.5)
Chloride: 100 mEq/L (ref 96–112)
Chloride: 97 mEq/L (ref 96–112)
Creatinine, Ser: 1 mg/dL (ref 0.4–1.2)
Creatinine, Ser: 1.08 mg/dL (ref 0.4–1.2)
Creatinine, Ser: 1.11 mg/dL (ref 0.4–1.2)
GFR calc Af Amer: 55 mL/min — ABNORMAL LOW (ref >60)
GFR calc Af Amer: 57 mL/min — ABNORMAL LOW (ref >60)
GFR calc Af Amer: >60 mL/min (ref >60)
GFR calc non Af Amer: 45 mL/min — ABNORMAL LOW (ref >60)
Glucose, Bld: 150 mg/dL — ABNORMAL HIGH (ref 70–99)
Glucose, Bld: 291 mg/dL — ABNORMAL HIGH (ref 70–99)
Potassium: 3.6 mEq/L (ref 3.5–5.1)
Potassium: 3.9 mEq/L (ref 3.5–5.1)
Potassium: 4.5 mEq/L (ref 3.5–5.1)
Sodium: 134 mEq/L — ABNORMAL LOW (ref 135–145)
Sodium: 139 mEq/L (ref 135–145)

## 2010-08-06 LAB — URINALYSIS, ROUTINE W REFLEX MICROSCOPIC
Bilirubin Urine: NEGATIVE
Nitrite: NEGATIVE
Specific Gravity, Urine: 1.009 (ref 1.005–1.030)
Urobilinogen, UA: 0.2 mg/dL (ref 0.0–1.0)

## 2010-08-06 LAB — GLUCOSE, CAPILLARY
Glucose-Capillary: 110 mg/dL — ABNORMAL HIGH (ref 70–99)
Glucose-Capillary: 115 mg/dL — ABNORMAL HIGH (ref 70–99)
Glucose-Capillary: 116 mg/dL — ABNORMAL HIGH (ref 70–99)
Glucose-Capillary: 133 mg/dL — ABNORMAL HIGH (ref 70–99)
Glucose-Capillary: 134 mg/dL — ABNORMAL HIGH (ref 70–99)
Glucose-Capillary: 151 mg/dL — ABNORMAL HIGH (ref 70–99)
Glucose-Capillary: 164 mg/dL — ABNORMAL HIGH (ref 70–99)
Glucose-Capillary: 164 mg/dL — ABNORMAL HIGH (ref 70–99)
Glucose-Capillary: 171 mg/dL — ABNORMAL HIGH (ref 70–99)
Glucose-Capillary: 183 mg/dL — ABNORMAL HIGH (ref 70–99)
Glucose-Capillary: 207 mg/dL — ABNORMAL HIGH (ref 70–99)
Glucose-Capillary: 222 mg/dL — ABNORMAL HIGH (ref 70–99)
Glucose-Capillary: 227 mg/dL — ABNORMAL HIGH (ref 70–99)
Glucose-Capillary: 238 mg/dL — ABNORMAL HIGH (ref 70–99)
Glucose-Capillary: 244 mg/dL — ABNORMAL HIGH (ref 70–99)
Glucose-Capillary: 245 mg/dL — ABNORMAL HIGH (ref 70–99)
Glucose-Capillary: 247 mg/dL — ABNORMAL HIGH (ref 70–99)
Glucose-Capillary: 250 mg/dL — ABNORMAL HIGH (ref 70–99)
Glucose-Capillary: 250 mg/dL — ABNORMAL HIGH (ref 70–99)
Glucose-Capillary: 254 mg/dL — ABNORMAL HIGH (ref 70–99)
Glucose-Capillary: 256 mg/dL — ABNORMAL HIGH (ref 70–99)
Glucose-Capillary: 261 mg/dL — ABNORMAL HIGH (ref 70–99)
Glucose-Capillary: 275 mg/dL — ABNORMAL HIGH (ref 70–99)
Glucose-Capillary: 280 mg/dL — ABNORMAL HIGH (ref 70–99)
Glucose-Capillary: 308 mg/dL — ABNORMAL HIGH (ref 70–99)
Glucose-Capillary: 331 mg/dL — ABNORMAL HIGH (ref 70–99)
Glucose-Capillary: 377 mg/dL — ABNORMAL HIGH (ref 70–99)
Glucose-Capillary: 69 mg/dL — ABNORMAL LOW (ref 70–99)
Glucose-Capillary: 88 mg/dL (ref 70–99)

## 2010-08-06 LAB — TROPONIN I: Troponin I: <0.01 ng/mL (ref 0.00–0.06)

## 2010-08-06 LAB — PROTIME-INR
INR: 1.6 — ABNORMAL HIGH (ref 0.00–1.49)
INR: 1.7 — ABNORMAL HIGH (ref 0.00–1.49)
INR: 1.8 — ABNORMAL HIGH (ref 0.00–1.49)
INR: 2.6 — ABNORMAL HIGH (ref 0.00–1.49)
INR: 2.8 — ABNORMAL HIGH (ref 0.00–1.49)
INR: 3.7 — ABNORMAL HIGH (ref 0.00–1.49)
Prothrombin Time: 17.7 seconds — ABNORMAL HIGH (ref 11.6–15.2)
Prothrombin Time: 18.1 seconds — ABNORMAL HIGH (ref 11.6–15.2)
Prothrombin Time: 19.7 seconds — ABNORMAL HIGH (ref 11.6–15.2)
Prothrombin Time: 20.4 seconds — ABNORMAL HIGH (ref 11.6–15.2)
Prothrombin Time: 21.5 seconds — ABNORMAL HIGH (ref 11.6–15.2)
Prothrombin Time: 24.7 seconds — ABNORMAL HIGH (ref 11.6–15.2)
Prothrombin Time: 28.7 seconds — ABNORMAL HIGH (ref 11.6–15.2)
Prothrombin Time: 31.8 seconds — ABNORMAL HIGH (ref 11.6–15.2)
Prothrombin Time: 39.7 seconds — ABNORMAL HIGH (ref 11.6–15.2)

## 2010-08-06 LAB — RETICULOCYTES
RBC.: 4.13 MIL/uL (ref 3.87–5.11)
Retic Ct Pct: 1.3 % (ref 0.4–3.1)

## 2010-08-06 LAB — CK TOTAL AND CKMB (NOT AT ARMC)
CK, MB: 4.1 ng/mL — ABNORMAL HIGH (ref 0.3–4.0)
Relative Index: 2.1 (ref 0.0–2.5)

## 2010-08-06 LAB — URINE MICROSCOPIC-ADD ON

## 2010-08-06 LAB — URINE CULTURE

## 2010-08-06 LAB — TSH: TSH: 0.656 u[IU]/mL (ref 0.350–4.500)

## 2010-08-06 LAB — HEMOGLOBIN AND HEMATOCRIT, BLOOD
HCT: 42 % (ref 36.0–46.0)
Hemoglobin: 14.1 g/dL (ref 12.0–15.0)

## 2010-08-06 LAB — FERRITIN: Ferritin: 47 ng/mL (ref 10–291)

## 2010-08-06 LAB — LIPID PANEL: HDL: 39 mg/dL — ABNORMAL LOW (ref >39)

## 2010-08-06 LAB — PREPARE FRESH FROZEN PLASMA

## 2010-08-06 LAB — APTT: aPTT: 59 seconds — ABNORMAL HIGH (ref 24–37)

## 2010-08-06 LAB — HEMOGLOBIN A1C: Hgb A1c MFr Bld: 8.9 % — ABNORMAL HIGH (ref 4.6–6.1)

## 2010-08-06 LAB — BRAIN NATRIURETIC PEPTIDE: Pro B Natriuretic peptide (BNP): 568 pg/mL — ABNORMAL HIGH (ref 0.0–100.0)

## 2010-08-06 LAB — FOLATE: Folate: >20 ng/mL

## 2010-08-06 LAB — LIPASE, BLOOD: Lipase: 38 U/L (ref 11–59)

## 2010-08-11 LAB — GLUCOSE, CAPILLARY
Glucose-Capillary: 156 mg/dL — ABNORMAL HIGH (ref 70–99)
Glucose-Capillary: 163 mg/dL — ABNORMAL HIGH (ref 70–99)
Glucose-Capillary: 166 mg/dL — ABNORMAL HIGH (ref 70–99)
Glucose-Capillary: 192 mg/dL — ABNORMAL HIGH (ref 70–99)
Glucose-Capillary: 225 mg/dL — ABNORMAL HIGH (ref 70–99)
Glucose-Capillary: 333 mg/dL — ABNORMAL HIGH (ref 70–99)
Glucose-Capillary: 433 mg/dL — ABNORMAL HIGH (ref 70–99)
Glucose-Capillary: 141 mg/dL — ABNORMAL HIGH (ref 70–99)
Glucose-Capillary: 152 mg/dL — ABNORMAL HIGH (ref 70–99)
Glucose-Capillary: 217 mg/dL — ABNORMAL HIGH (ref 70–99)
Glucose-Capillary: 254 mg/dL — ABNORMAL HIGH (ref 70–99)
Glucose-Capillary: 256 mg/dL — ABNORMAL HIGH (ref 70–99)
Glucose-Capillary: 260 mg/dL — ABNORMAL HIGH (ref 70–99)
Glucose-Capillary: 283 mg/dL — ABNORMAL HIGH (ref 70–99)
Glucose-Capillary: 314 mg/dL — ABNORMAL HIGH (ref 70–99)

## 2010-08-11 LAB — COMPREHENSIVE METABOLIC PANEL
ALT: 20 U/L (ref 0–35)
AST: 28 U/L (ref 0–37)
AST: 22 U/L (ref 0–37)
Albumin: 3.2 g/dL — ABNORMAL LOW (ref 3.5–5.2)
Albumin: 3.3 g/dL — ABNORMAL LOW (ref 3.5–5.2)
Alkaline Phosphatase: 106 U/L (ref 39–117)
Alkaline Phosphatase: 94 U/L (ref 39–117)
BUN: 16 mg/dL (ref 6–23)
BUN: 22 mg/dL (ref 6–23)
CO2: 23 mEq/L (ref 19–32)
CO2: 28 mEq/L (ref 19–32)
Chloride: 101 mEq/L (ref 96–112)
Chloride: 96 mEq/L (ref 96–112)
Chloride: 96 mEq/L (ref 96–112)
Creatinine, Ser: 1.04 mg/dL (ref 0.4–1.2)
GFR calc Af Amer: >60 mL/min (ref >60)
GFR calc non Af Amer: 48 mL/min — ABNORMAL LOW (ref >60)
Glucose, Bld: 394 mg/dL — ABNORMAL HIGH (ref 70–99)
Potassium: 3.8 mEq/L (ref 3.5–5.1)
Potassium: 3.8 mEq/L (ref 3.5–5.1)
Sodium: 132 mEq/L — ABNORMAL LOW (ref 135–145)
Total Bilirubin: 0.7 mg/dL (ref 0.3–1.2)
Total Bilirubin: 1 mg/dL (ref 0.3–1.2)
Total Bilirubin: 1 mg/dL (ref 0.3–1.2)
Total Protein: 6.2 g/dL (ref 6.0–8.3)

## 2010-08-11 LAB — DIFFERENTIAL
Basophils Absolute: 0 10*3/uL (ref 0.0–0.1)
Basophils Relative: 0 % (ref 0–1)
Eosinophils Absolute: 0.1 10*3/uL (ref 0.0–0.7)
Neutro Abs: 6.4 10*3/uL (ref 1.7–7.7)
Neutrophils Relative %: 72 % (ref 43–77)

## 2010-08-11 LAB — CBC
HCT: 42.4 % (ref 36.0–46.0)
MCHC: 33.9 g/dL (ref 30.0–36.0)
MCV: 88.5 fL (ref 78.0–100.0)
Platelets: 249 10*3/uL (ref 150–400)
Platelets: 269 10*3/uL (ref 150–400)
Platelets: 228 10*3/uL (ref 150–400)
RBC: 4.88 MIL/uL (ref 3.87–5.11)
RDW: 14.8 % (ref 11.5–15.5)
WBC: 8 10*3/uL (ref 4.0–10.5)
WBC: 6.6 10*3/uL (ref 4.0–10.5)

## 2010-08-11 LAB — LIPID PANEL
Cholesterol: 100 mg/dL (ref 0–200)
LDL Cholesterol: 45 mg/dL (ref 0–99)
LDL Cholesterol: 38 mg/dL (ref 0–99)
Total CHOL/HDL Ratio: 3.5 RATIO
Triglycerides: 178 mg/dL — ABNORMAL HIGH (ref <150)
VLDL: 36 mg/dL (ref 0–40)

## 2010-08-11 LAB — URINALYSIS, ROUTINE W REFLEX MICROSCOPIC
Bilirubin Urine: NEGATIVE
Ketones, ur: NEGATIVE mg/dL
Nitrite: NEGATIVE
Specific Gravity, Urine: 1.024 (ref 1.005–1.030)
Urobilinogen, UA: 0.2 mg/dL (ref 0.0–1.0)
pH: 6.5 (ref 5.0–8.0)

## 2010-08-11 LAB — PROTIME-INR
INR: 1.7 — ABNORMAL HIGH (ref 0.00–1.49)
INR: 2.4 — ABNORMAL HIGH (ref 0.00–1.49)
INR: 1.7 — ABNORMAL HIGH (ref 0.00–1.49)
INR: 1.9 — ABNORMAL HIGH (ref 0.00–1.49)
Prothrombin Time: 27.4 seconds — ABNORMAL HIGH (ref 11.6–15.2)
Prothrombin Time: 20.7 seconds — ABNORMAL HIGH (ref 11.6–15.2)

## 2010-08-11 LAB — TSH: TSH: 0.08 u[IU]/mL — ABNORMAL LOW (ref 0.350–4.500)

## 2010-08-11 LAB — CARDIAC PANEL(CRET KIN+CKTOT+MB+TROPI)
CK, MB: 2.5 ng/mL (ref 0.3–4.0)
Relative Index: 2 (ref 0.0–2.5)
Relative Index: 2.1 (ref 0.0–2.5)
Relative Index: 2 (ref 0.0–2.5)
Total CK: 119 U/L (ref 7–177)
Total CK: 132 U/L (ref 7–177)
Troponin I: 0.01 ng/mL (ref 0.00–0.06)
Troponin I: 0.01 ng/mL (ref 0.00–0.06)
Troponin I: 0.01 ng/mL (ref 0.00–0.06)

## 2010-08-11 LAB — APTT: aPTT: 36 seconds (ref 24–37)

## 2010-08-11 LAB — POCT I-STAT, CHEM 8
Calcium, Ion: 1.06 mmol/L — ABNORMAL LOW (ref 1.12–1.32)
Glucose, Bld: 671 mg/dL (ref 70–99)
HCT: 46 % (ref 36.0–46.0)
TCO2: 29 mmol/L (ref 0–100)

## 2010-08-11 LAB — URINE MICROSCOPIC-ADD ON

## 2010-08-11 LAB — URINE CULTURE
Colony Count: >=100000
Colony Count: >=100000

## 2010-08-11 LAB — POCT CARDIAC MARKERS: Troponin i, poc: <0.05 ng/mL (ref 0.00–0.09)

## 2010-08-11 LAB — OSMOLALITY, URINE: Osmolality, Ur: 456 mOsm/kg (ref 390–1090)

## 2010-08-11 LAB — BRAIN NATRIURETIC PEPTIDE: Pro B Natriuretic peptide (BNP): 125 pg/mL — ABNORMAL HIGH (ref 0.0–100.0)

## 2010-08-11 LAB — SODIUM, URINE, RANDOM: Sodium, Ur: 18 mEq/L

## 2010-08-11 LAB — CK TOTAL AND CKMB (NOT AT ARMC): Total CK: 97 U/L (ref 7–177)

## 2010-08-12 LAB — GLUCOSE, CAPILLARY
Glucose-Capillary: 149 mg/dL — ABNORMAL HIGH (ref 70–99)
Glucose-Capillary: 185 mg/dL — ABNORMAL HIGH (ref 70–99)
Glucose-Capillary: 198 mg/dL — ABNORMAL HIGH (ref 70–99)
Glucose-Capillary: 204 mg/dL — ABNORMAL HIGH (ref 70–99)
Glucose-Capillary: 226 mg/dL — ABNORMAL HIGH (ref 70–99)
Glucose-Capillary: 231 mg/dL — ABNORMAL HIGH (ref 70–99)
Glucose-Capillary: 243 mg/dL — ABNORMAL HIGH (ref 70–99)
Glucose-Capillary: 275 mg/dL — ABNORMAL HIGH (ref 70–99)
Glucose-Capillary: 326 mg/dL — ABNORMAL HIGH (ref 70–99)
Glucose-Capillary: 55 mg/dL — ABNORMAL LOW (ref 70–99)
Glucose-Capillary: 56 mg/dL — ABNORMAL LOW (ref 70–99)
Glucose-Capillary: 105 mg/dL — ABNORMAL HIGH (ref 70–99)
Glucose-Capillary: 112 mg/dL — ABNORMAL HIGH (ref 70–99)
Glucose-Capillary: 135 mg/dL — ABNORMAL HIGH (ref 70–99)
Glucose-Capillary: 139 mg/dL — ABNORMAL HIGH (ref 70–99)
Glucose-Capillary: 156 mg/dL — ABNORMAL HIGH (ref 70–99)
Glucose-Capillary: 180 mg/dL — ABNORMAL HIGH (ref 70–99)
Glucose-Capillary: 185 mg/dL — ABNORMAL HIGH (ref 70–99)
Glucose-Capillary: 191 mg/dL — ABNORMAL HIGH (ref 70–99)
Glucose-Capillary: 200 mg/dL — ABNORMAL HIGH (ref 70–99)
Glucose-Capillary: 215 mg/dL — ABNORMAL HIGH (ref 70–99)
Glucose-Capillary: 239 mg/dL — ABNORMAL HIGH (ref 70–99)
Glucose-Capillary: 246 mg/dL — ABNORMAL HIGH (ref 70–99)
Glucose-Capillary: 317 mg/dL — ABNORMAL HIGH (ref 70–99)
Glucose-Capillary: 439 mg/dL — ABNORMAL HIGH (ref 70–99)
Glucose-Capillary: 443 mg/dL — ABNORMAL HIGH (ref 70–99)
Glucose-Capillary: 512 mg/dL (ref 70–99)
Glucose-Capillary: 527 mg/dL (ref 70–99)
Glucose-Capillary: 95 mg/dL (ref 70–99)

## 2010-08-12 LAB — GLUCOSE, RANDOM
Glucose, Bld: 449 mg/dL — ABNORMAL HIGH (ref 70–99)
Glucose, Bld: 504 mg/dL (ref 70–99)

## 2010-08-12 LAB — CBC
HCT: 36.3 % (ref 36.0–46.0)
HCT: 35.3 % — ABNORMAL LOW (ref 36.0–46.0)
HCT: 38.6 % (ref 36.0–46.0)
Hemoglobin: 12.6 g/dL (ref 12.0–15.0)
Hemoglobin: 13.1 g/dL (ref 12.0–15.0)
MCHC: 34.7 g/dL (ref 30.0–36.0)
MCHC: 34 g/dL (ref 30.0–36.0)
MCV: 87.3 fL (ref 78.0–100.0)
MCV: 88.5 fL (ref 78.0–100.0)
Platelets: 189 10*3/uL (ref 150–400)
RBC: 4.51 MIL/uL (ref 3.87–5.11)
RBC: 3.99 MIL/uL (ref 3.87–5.11)
RDW: 15.9 % — ABNORMAL HIGH (ref 11.5–15.5)
RDW: 15.7 % — ABNORMAL HIGH (ref 11.5–15.5)
WBC: 5.8 10*3/uL (ref 4.0–10.5)
WBC: 10.5 10*3/uL (ref 4.0–10.5)

## 2010-08-12 LAB — BASIC METABOLIC PANEL
BUN: 10 mg/dL (ref 6–23)
BUN: 11 mg/dL (ref 6–23)
BUN: 20 mg/dL (ref 6–23)
BUN: 21 mg/dL (ref 6–23)
CO2: 31 mEq/L (ref 19–32)
CO2: 33 mEq/L — ABNORMAL HIGH (ref 19–32)
CO2: 37 mEq/L — ABNORMAL HIGH (ref 19–32)
Calcium: 8.6 mg/dL (ref 8.4–10.5)
Calcium: 9.1 mg/dL (ref 8.4–10.5)
Calcium: 9.1 mg/dL (ref 8.4–10.5)
Calcium: 9.4 mg/dL (ref 8.4–10.5)
Calcium: 9.7 mg/dL (ref 8.4–10.5)
Chloride: 100 mEq/L (ref 96–112)
Chloride: 98 mEq/L (ref 96–112)
Chloride: 94 mEq/L — ABNORMAL LOW (ref 96–112)
Chloride: 97 mEq/L (ref 96–112)
Creatinine, Ser: 0.87 mg/dL (ref 0.4–1.2)
Creatinine, Ser: 1.08 mg/dL (ref 0.4–1.2)
Creatinine, Ser: 0.97 mg/dL (ref 0.4–1.2)
Creatinine, Ser: 1.06 mg/dL (ref 0.4–1.2)
GFR calc Af Amer: 59 mL/min — ABNORMAL LOW (ref >60)
GFR calc Af Amer: >60 mL/min (ref >60)
GFR calc Af Amer: >60 mL/min (ref >60)
GFR calc Af Amer: >60 mL/min (ref >60)
GFR calc Af Amer: >60 mL/min (ref >60)
GFR calc non Af Amer: >60 mL/min (ref >60)
GFR calc non Af Amer: 50 mL/min — ABNORMAL LOW (ref >60)
GFR calc non Af Amer: 55 mL/min — ABNORMAL LOW (ref >60)
Glucose, Bld: 134 mg/dL — ABNORMAL HIGH (ref 70–99)
Glucose, Bld: 140 mg/dL — ABNORMAL HIGH (ref 70–99)
Glucose, Bld: 161 mg/dL — ABNORMAL HIGH (ref 70–99)
Potassium: 4 mEq/L (ref 3.5–5.1)
Potassium: 4.1 mEq/L (ref 3.5–5.1)
Sodium: 131 mEq/L — ABNORMAL LOW (ref 135–145)
Sodium: 137 mEq/L (ref 135–145)
Sodium: 139 mEq/L (ref 135–145)

## 2010-08-12 LAB — PROTIME-INR
INR: 2 — ABNORMAL HIGH (ref 0.00–1.49)
INR: 2.3 — ABNORMAL HIGH (ref 0.00–1.49)
INR: 2.6 — ABNORMAL HIGH (ref 0.00–1.49)
INR: 2.2 — ABNORMAL HIGH (ref 0.00–1.49)
INR: 2.3 — ABNORMAL HIGH (ref 0.00–1.49)
Prothrombin Time: 27.1 seconds — ABNORMAL HIGH (ref 11.6–15.2)
Prothrombin Time: 29.6 seconds — ABNORMAL HIGH (ref 11.6–15.2)
Prothrombin Time: 32 seconds — ABNORMAL HIGH (ref 11.6–15.2)
Prothrombin Time: 24 seconds — ABNORMAL HIGH (ref 11.6–15.2)
Prothrombin Time: 25.4 seconds — ABNORMAL HIGH (ref 11.6–15.2)
Prothrombin Time: 27.2 seconds — ABNORMAL HIGH (ref 11.6–15.2)

## 2010-08-12 LAB — COMPREHENSIVE METABOLIC PANEL
AST: 28 U/L (ref 0–37)
CO2: 32 mEq/L (ref 19–32)
Chloride: 92 mEq/L — ABNORMAL LOW (ref 96–112)
Creatinine, Ser: 0.98 mg/dL (ref 0.4–1.2)
GFR calc Af Amer: >60 mL/min (ref >60)
GFR calc non Af Amer: 54 mL/min — ABNORMAL LOW (ref >60)
Total Bilirubin: 0.5 mg/dL (ref 0.3–1.2)

## 2010-08-12 LAB — POCT I-STAT, CHEM 8
Calcium, Ion: 1.08 mmol/L — ABNORMAL LOW (ref 1.12–1.32)
Glucose, Bld: 184 mg/dL — ABNORMAL HIGH (ref 70–99)
HCT: 36 % (ref 36.0–46.0)
Hemoglobin: 12.2 g/dL (ref 12.0–15.0)
TCO2: 29 mmol/L (ref 0–100)

## 2010-08-12 LAB — DIFFERENTIAL
Basophils Absolute: 0 10*3/uL (ref 0.0–0.1)
Basophils Relative: 0 % (ref 0–1)
Eosinophils Relative: 0 % (ref 0–5)
Monocytes Absolute: 0.2 10*3/uL (ref 0.1–1.0)

## 2010-08-12 LAB — BRAIN NATRIURETIC PEPTIDE
Pro B Natriuretic peptide (BNP): 210 pg/mL — ABNORMAL HIGH (ref 0.0–100.0)
Pro B Natriuretic peptide (BNP): 351 pg/mL — ABNORMAL HIGH (ref 0.0–100.0)

## 2010-08-12 LAB — MAGNESIUM: Magnesium: 2 mg/dL (ref 1.5–2.5)

## 2010-08-27 ENCOUNTER — Telehealth: Payer: Self-pay | Admitting: Cardiovascular Disease

## 2010-08-27 NOTE — Telephone Encounter (Signed)
Telephoned pt's #, and got [name], daughter. She informed me that pt is in [facility]. In ICU.

## 2010-08-29 ENCOUNTER — Emergency Department (HOSPITAL_COMMUNITY)
Admission: EM | Admit: 2010-08-29 | Discharge: 2010-08-30 | Disposition: A | Discharge status: Home / Self Care | Payer: Medicare Other | Attending: Emergency Medicine | Admitting: Emergency Medicine

## 2010-08-29 ENCOUNTER — Emergency Department (HOSPITAL_COMMUNITY): Payer: Medicare Other

## 2010-08-29 DIAGNOSIS — E119 Type 2 diabetes mellitus without complications: Secondary | ICD-10-CM | POA: Insufficient documentation

## 2010-08-29 DIAGNOSIS — J4489 Other specified chronic obstructive pulmonary disease: Secondary | ICD-10-CM | POA: Insufficient documentation

## 2010-08-29 DIAGNOSIS — D6800 Von Willebrand disease, unspecified: Secondary | ICD-10-CM | POA: Insufficient documentation

## 2010-08-29 DIAGNOSIS — F068 Other specified mental disorders due to known physiological condition: Secondary | ICD-10-CM | POA: Insufficient documentation

## 2010-08-29 DIAGNOSIS — I4891 Unspecified atrial fibrillation: Secondary | ICD-10-CM | POA: Insufficient documentation

## 2010-08-29 DIAGNOSIS — I2510 Atherosclerotic heart disease of native coronary artery without angina pectoris: Secondary | ICD-10-CM | POA: Insufficient documentation

## 2010-08-29 DIAGNOSIS — E7800 Pure hypercholesterolemia, unspecified: Secondary | ICD-10-CM | POA: Insufficient documentation

## 2010-08-29 DIAGNOSIS — I509 Heart failure, unspecified: Secondary | ICD-10-CM | POA: Insufficient documentation

## 2010-08-29 DIAGNOSIS — Z951 Presence of aortocoronary bypass graft: Secondary | ICD-10-CM | POA: Insufficient documentation

## 2010-08-29 DIAGNOSIS — R059 Cough, unspecified: Secondary | ICD-10-CM | POA: Insufficient documentation

## 2010-08-29 DIAGNOSIS — R0602 Shortness of breath: Secondary | ICD-10-CM | POA: Insufficient documentation

## 2010-08-29 DIAGNOSIS — R0609 Other forms of dyspnea: Secondary | ICD-10-CM | POA: Insufficient documentation

## 2010-08-29 DIAGNOSIS — J449 Chronic obstructive pulmonary disease, unspecified: Secondary | ICD-10-CM | POA: Insufficient documentation

## 2010-08-29 DIAGNOSIS — Z950 Presence of cardiac pacemaker: Secondary | ICD-10-CM | POA: Insufficient documentation

## 2010-08-29 DIAGNOSIS — E039 Hypothyroidism, unspecified: Secondary | ICD-10-CM | POA: Insufficient documentation

## 2010-08-29 DIAGNOSIS — R05 Cough: Secondary | ICD-10-CM | POA: Insufficient documentation

## 2010-08-29 DIAGNOSIS — R0989 Other specified symptoms and signs involving the circulatory and respiratory systems: Secondary | ICD-10-CM | POA: Insufficient documentation

## 2010-08-29 LAB — DIFFERENTIAL
Basophils Absolute: 0 10*3/uL (ref 0.0–0.1)
Basophils Relative: 0 % (ref 0–1)
Eosinophils Relative: 0 % (ref 0–5)
Monocytes Absolute: 1.4 10*3/uL — ABNORMAL HIGH (ref 0.1–1.0)

## 2010-08-29 LAB — CBC
MCHC: 31.7 g/dL (ref 30.0–36.0)
RDW: 16.5 % — ABNORMAL HIGH (ref 11.5–15.5)

## 2010-08-29 LAB — GLUCOSE, CAPILLARY: Glucose-Capillary: 65 mg/dL — ABNORMAL LOW (ref 70–99)

## 2010-08-30 LAB — GLUCOSE, CAPILLARY: Glucose-Capillary: 182 mg/dL — ABNORMAL HIGH (ref 70–99)

## 2010-08-30 LAB — CK TOTAL AND CKMB (NOT AT ARMC): Total CK: 53 U/L (ref 7–177)

## 2010-08-30 LAB — BASIC METABOLIC PANEL
Calcium: 9.4 mg/dL (ref 8.4–10.5)
Chloride: 98 mEq/L (ref 96–112)
Creatinine, Ser: 1.06 mg/dL (ref 0.4–1.2)
GFR calc Af Amer: 60 mL/min — ABNORMAL LOW (ref >60)

## 2010-08-30 LAB — PROTIME-INR
INR: 1.8 — ABNORMAL HIGH (ref 0.00–1.49)
Prothrombin Time: 21.1 seconds — ABNORMAL HIGH (ref 11.6–15.2)

## 2010-09-09 ENCOUNTER — Ambulatory Visit: Payer: Self-pay | Admitting: Cardiovascular Disease

## 2010-09-09 NOTE — Consult Note (Signed)
[name], [name]                 ACCOUNT NO.:  [id number]   MEDICAL RECORD NO.:  [id number]          PATIENT TYPE:  INP   LOCATION:  4738                         FACILITY:  [facility]   PHYSICIAN:  Samul Dada, M.D.DATE OF BIRTH:  [birth year]   DATE OF CONSULTATION:  03/01/2008  DATE OF DISCHARGE:                                 CONSULTATION   HISTORY:  [name] is an 77 year old white married female whom I  am asked to see in consultation by Dr. Charlton Haws for further  evaluation of the patient's diagnosis of von Willebrand disease.  The  patient was readmitted to the hospital on February 29, 2008, for  shortness of breath and nausea.  Apparently, the patient went to the  [facility] where she had a CT angiogram of the chest to rule out  pulmonary embolism.  This was negative for pulmonary emboli, however, it  showed a filling defect in the left atrial appendage consistent with  thrombus and the patient was transferred here to be under the care of  [facility] cardiologist.  Of note is the fact that the patient had been  hospitalized from February 24, 2008, through February 28, 2008.  That  admission also was precipitated by shortness of breath and abnormal  cardiac rhythm.  The patient had a rapid ventricular response.  She has  been in chronic atrial fibrillation for sometime.   With regard to the patient's von Willebrand disease, the patient gives a  long history of bleeding problems starting when she had her tonsils  removed at age 77, and required 4 units of blood and prolonged  hospitalization.  She says that she has had abnormal bleeding and blood  transfusions with several surgeries.  She has had prolonged bleeding  with cuts and with her periods.  The patient apparently was referred to  [facility] about 12 years ago and was told that she had von Willebrand  disease.  The patient cannot give much of a history after that.  It does  appear that she may have seen  either Dr. Manual Meier or Dr. Ruthann Cancer, here in [place], either in the office or in the hospital.  Apparently, she was seen by hematologist prior to her coronary artery  bypass graft surgery 9-10 years ago.  The patient is not aware of any  treatments that she has received for her von Willebrand disease.  We do  have records of a colonoscopy that was carried out by Dr. Carman Ching  within the past few years and apparently, the patient was given DDAVP  preoperatively.  She has had gastric polyps biopsied apparently without  any abnormal bleeding.  As stated, the patient is here undergoing  evaluation for her cardiac problems and the apparent thrombus in the  left atrial appendage.  The patient did have a cardiac MRI today for  further workup.   PAST MEDICAL HISTORY:  Significant for:  1. Diabetes mellitus, for which the patient takes insulin.  2. Hypertension.  3. Hyperlipidemia.  4. Hypothyroidism.  5. She has a history of pneumonia  and asthma, also coronary artery      disease.  6. She has been in atrial fibrillation for along time.  7. She had coronary artery bypass surgery.  8. She has also had a hernia repair.   MEDICINES:  Recorded on her admission on February 24, 2008, were as  follows:  1. Fish oil b.i.d.  2. Flaxseed oil.  3. Cinnamon.  4. Lutein.  5. Multivitamins.  6. Synthroid 225 mcg p.o. daily.  7. Doxazosin 4 mg p.o. daily.  8. Insulin.  9. Vitamin B12.  10.Digoxin 0.25 mg daily.  11.Metoprolol 12.5 mg b.i.d.  12.Lasix 20 mg daily.  13.Cardizem 180 mg p.o. b.i.d.  14.Niacin 250 mg p.o. b.i.d.  15.Calcium.   ALLERGIES:  The patient is allergic to SULFA, AUGMENTIN, BIAXIN, KEFLEX,  and DICYCLOMINE.   FAMILY HISTORY:  The patient says that her mother used to have excessive  bleeding.  She is not aware of anybody else in the family having von  Willebrand disease.  Family history is positive for coronary artery  disease.  The patient does have 3 and  a daughter, 10 grandchildren and 5  great-grandchildren.  Apparently, none of them have any bleeding  problems.   SOCIAL HISTORY:  The patient lives in [place] with her husband.  She  drives as fairly independent.  She was born at [place], has  lived in [place], last 31 years.  She denies any significant use of  cigarettes either now or in the past.  No history of alcohol.   REVIEW OF SYSTEMS:  The patient complains of feeling tired.  She denies  any history of stroke.  She has mammograms yearly.  She does have hay  fever and some sinus problems.  Her weight runs around 200 pounds,  height 66 inches.  She denies any history of any weight loss or trouble  leading.  No diarrhea or constipation.  No blood in the stools.  She has  had colonoscopy carried out.  She apparently does have a history of  colonic polyps.  She denies any history of heart attack.  She does have  breathing problems.  She has been seen by both Dr. Jetty Duhamel, and  Dr. Marcelyn Bruins during previous hospitalizations.  No urinary problems.  She denies any postmenopausal bleeding or hot flashes.  No swelling of  legs or history of blood clots.  No fever.  She has had some night  sweats.  No arthritis, back, or neck pain.  She denies any skin  problems.  No history of depression.   PHYSICAL EXAMINATION:  GENERAL:  The patient is very pleasant elderly  woman, somewhat obese in no acute distress.  She is afebrile.  VITAL SIGNS:  Blood pressure 119/81; pulse about 70-80, and irregularly  regular; respirations regular and unlabored; and O2 saturation on room  air at rest was 93%.  HEENT:  There is no scleral icterus.  Mouth and pharynx are benign.  She  has upper and lower dentures.  NECK:  No adenopathy in the neck.  LUNGS:  Some expiratory wheezing, left greater than right.  CARDIAC:  Irregular rhythm.  I did not hear a murmur.  BREASTS:  Without masses.  No axillary adenopathy.  BACK:  No skeletal  tenderness or deformity.  ABDOMEN:  Soft and nontender with no organomegaly or masses palpable.  The patient has had cardiac surgery.  EXTREMITIES:  No peripheral edema or clubbing.  I do not see any  petechiae.  She had a few tiny purpura over the arms.  The patient has  been in the hospital.  NEUROLOGIC:  Grossly normal.   LABORATORY DATA TODAY:  Hemoglobin 13.6, hematocrit 41.4, white count  9.4, and platelets 249,000.  BMET was normal.  BMP from February 24, 2008, was 397.  Protime from February 24, 2008, was 13.9 with an INR of  1.1.  The patient apparently has a left ventricular ejection fraction of  35%.   IMPRESSION AND PLAN:  As stated above, the patient has a history about  von Willebrand disease, apparently established at [facility] [place] about  12 years ago.  Apparently, she has seen Dr. Manual Meier in the past and  also Dr. Ruthann Cancer recently.  I have looked through the cancer  center records and we really do not have anything definitive on her.  I  believe, she has been seen only when she was in the hospital and needed  surgery or some other intervention that might place her at risk for  bleeding.  Currently, the patient is on heparin at 1350 units per hour.  We will go ahead an order a von Willebrand panel with multimers.  With  the patient on heparin, baseline PTT bleeding time really is not going  to be helpful.  I think the question that is being presented is whether  this patient could be a candidate for some form of chronic  anticoagulation in the form of Coumadin.  Her chart mentions that she is  not a candidate for Coumadin or aspirin.  This may be true, but will  require further investigation to better determine what her risk of  bleeding would be if she were to be placed on Coumadin for treatment of  her atrial fibrillation and the probable thrombus in her left atrium.   This has been discussed with Dr. Eden Emms.  I have ordered the appropriate  studies, which  probably will not be back for several days.      Samul Dada, M.D.  Electronically Signed     DSM/MEDQ  D:  03/01/2008  T:  03/02/2008  Job:  [id number]   cc:   Samul Dada, M.D.  [name] Jens Som, MD, [facility]  Peter C. Eden Emms, MD, [facility]

## 2010-09-09 NOTE — Discharge Summary (Signed)
NAMEROXANN, VIERRA                 ACCOUNT NO.:  [id number]   MEDICAL RECORD NO.:  [id number]          PATIENT TYPE:  INP   LOCATION:  2021                         FACILITY:  [facility]   PHYSICIAN:  Clinton D. Maple Hudson, MD, FCCP, FACPDATE OF BIRTH:  1925-04-25   DATE OF ADMISSION:  04/22/2007  DATE OF DISCHARGE:  05/02/2007                               DISCHARGE SUMMARY   DISCHARGE DIAGNOSES:  1. Asthmatic bronchitis with exacerbation.  2. Laryngitis.  3. Chronic atrial fibrillation.  4. Von Willebrand's disease (preventing use of aspirin or Coumadin).  5. Coronary artery disease with history of coronary bypass graft.  6. Anxiety.  7. Diabetes mellitus type 2.  8. Hypothyroid.   BRIEF HISTORY:  This 77 year old white female was admitted by the  cardiology service, was recently discharged from previous  hospitalization complaining of cough, shortness of breath and chest  discomfort with sensation that her heart jumps out of my chest with  any exertion.  Considered to have chest pain with atypical features.  She had a history of coronary disease with 5-vessel bypass grafting in  2000.  She had been discharged December 19th, with a diagnosis of  asthmatic bronchitis and with a past history of chronic atrial  fibrillation.  She was complaining of cough, wheezing, dyspnea with  minimal exertion and uncomfortable tachy palpitations.  On the day of  admission she complained of mid-scapular back and chest discomfort with  a burning sensation similar to her previous angina, decreased by  nitroglycerin.   PAST MEDICAL HISTORY:  A 5-vessel coronary bypass graft, negative  Myoview in 2005, chronic asthmatic bronchitis, atrial fibrillation with  no Coumadin because of her von Willebrand disease, hypothyroidism, type  2 insulin dependent diabetes mellitus, remote history of tobacco use,  hypertension, hyperlipidemia.  An echocardiogram on April 11, 2007  had given a left ventricular ejection  fraction 50% with hypokinesis of  the inferior base septum, mild MR, moderately severe TR and mild  dilatation of the left atrium, right ventricle, moderate dilatation of  the right atrium.   HOSPITAL COURSE:  She was begun with intravenous steroids and nebulized  Xopenex in an effort to control her wheezing and dyspnea to permit  better assessment of her cardiac status.  She was seen initially in  pulmonary consultation by Dr. Billy Fischer, who noted diffuse coarse  wheezing and described her affect as very animated, possibly secondary  to steroids.  She was also placed on doxycycline for antibiotic  coverage.  She was transferred to my pulmonary service on December 29  again with her main stated concern being that any labored breathing  raises her heart rate just jumps away.  She was not as concerned about  any anginal pain, initially did not want another heart catheterization.  She was continued on nebulized Xopenex, ipratropium and Pulmicort  avoiding by tapering off systemic steroids.  She then rapidly began to  report she felt better.  She complained of a bright red rash treated  topically with some Benadryl but not apparent on subsequent exam.  It  was never whether she  was really describing flushing or a rash.  A  tendency for her to become animated and concerned about shifting somatic  complaints persisted through the hospitalization.  Wheezes diminished  markedly with her nebulizer therapies.  Further assessed by cardiology  again maintained a decision not to place her on Coumadin because of her  von Willebrand disease.  EKG was felt to be nonspecific.  Cardizem was  increased to 240 mg b.i.d. with digoxin.  Blood sugars were elevated  requiring CBG and adjusted the insulin coverage and addressed further by  the tapering off of oral prednisone already in place.  She has a  dobutamine Myoview study on April 29, 2007 and Cardizem was then  renewed.  Results were described as  negative with no evidence of  ischemia, ejection fraction 43%, moderate global hypokinesis.  She  complained of hoarseness that she said was new during this  hospitalization without sore throat and attributed to nebulized  Pulmicort.  Cardiology mentioned history of statin allergy.  As of  January 5th, they felt that no further cardiac testing was planned at  that time and she was discharged having reached maximum hospital  benefit; noted is residual hoarseness without dysphagia or reflux  symptoms, pain or wheeze.  Blood sugar management was to be with Dr.  Lucianne Muss.   STUDIES:  EKG of December 18th described atrial fibrillation at 86 beats  per minute with nonspecific T-wave abnormality, probably digitalis  effect, unchanged from December 15.  Final reporting of December 30  described atrial fibrillation at 88 beats per minute with no significant  change.   Nuclear medicine myocardial Myoview study with SPECT described:  1. No areas of inducible ischemia.  2. Global hypokinesis.  3. Ejection fraction estimated 43%.   LABORATORY DATA:  Admission WBC 10,800 with hemoglobin of 12.4, platelet  count 316,000.  At discharge white count 10,100, hemoglobin stable at  14.  Admission neutrophils 82%, INR 0.9.  PT 12.8, PTT 28.  Admission  glucose 220, with final determination 115, BUN 19, creatinine 0.91.  GFR  was greater than 60.  Liver enzymes at discharge significant for albumin  3.9, ALT of 37, otherwise normal.  Cardiac enzymes were normal with no  evidence of injury.  B-type-natriuretic peptide 86, peripheral IgE 31.5  on hemolyzed specimens, digoxin level on admission less than 0.2.   DISCHARGE/PLAN:  Progressive activity as tolerated.  Diabetic diet as  per Dr. Lucianne Muss.  She is to follow up with Dr. Dionicio Stall month, Dr.  Diona Browner of cardiology and Dr. Lucianne Muss as scheduled or as needed.   DISCHARGE MEDICATIONS:  Prednisone 10 mg daily, home nebulizer with  Xopenex 1.25 mg t.i.d. p.r.n.,  nebulized Pulmicort 0.5 mg b.i.d.,  Pulmicort Flexinhaler 2 puffs b.i.d., levothyroxine 200 mcg or as  adjusted by Dr. Lucianne Muss, Lantus 40 units, Humalog insulin sliding scale  per Dr. Lucianne Muss, diltiazem ER 240 mg b.i.d., Lanoxin 0.125 mg, Prilosec 20  mg, nitroglycerin 0.4 SL p.r.n., niacin 500 mg daily, Colace 200 mg,  Mucinex 600 mg times 2 b.i.d., Ambien 10 mg h.s. p.r.n.  She is to stop  previous diltiazem 180 mg dose.      Clinton D. Maple Hudson, MD, [facility], FACP  Electronically Signed     CDY/MEDQ  D:  06/04/2007  T:  06/06/2007  Job:  [id number]   cc:   Rollene Rotunda, MD, [facility]

## 2010-09-09 NOTE — Op Note (Signed)
NAMESHONTAY, WALLNER                 ACCOUNT NO.:  [id number]   MEDICAL RECORD NO.:  [id number]          PATIENT TYPE:  INP   LOCATION:  2003                         FACILITY:  [facility]   PHYSICIAN:  Doylene Canning. Ladona Ridgel, MD    DATE OF BIRTH:  06-17-1924   DATE OF PROCEDURE:  05/29/2008  DATE OF DISCHARGE:                               OPERATIVE REPORT   PROCEDURE PERFORMED:  Implantation of a biventricular pacemaker.   INDICATION:  Ischemic cardiomyopathy, atrial fibrillation with a rapid  uncontrolled ventricular response, and class II heart failure with plans  for AV node ablation.   INTRODUCTION:  The patient is an 77 year old woman with known coronary  artery disease status post bypass surgery in the past.  She has a  history of congestive heart failure, very severe in nature.  She has a  history of symptomatic AFib with an uncontrolled ventricular response.  She is now referred for BiV pacemaker and AV node ablation.   PROCEDURE:  After informed consent was obtained, the patient was taken  to the Diagnostic EP Lab in a fasting state.  After usual preparation  and draping, intravenous fentanyl and midazolam were given for sedation.  A 30 mL of lidocaine was infiltrated in the left infraclavicular region.  A 5-cm incision was carried out over this region.  Electrocautery was  utilized to dissect down to the fascial plane.  The left subclavian vein  was punctured x2 and the St. Jude model 1788T, 52-cm active-fixation  pacing lead, serial #[id number] was advanced into the right ventricle.  Mapping was carried out at the final site.  The R-waves measured 15 mV  and the pacing threshold was less than a volt at 0.5 milliseconds.  The  pacing impedance was around 700 ohms.  A 10-volt pacing did not  stimulate the diaphragm.  A large injury current was present with active  fixation of the lead.  With the right ventricular lead in satisfactory  position, attention was then turned to placement of  the left ventricular  lead.  The coronary sinus guiding catheter was advanced into the  coronary sinus by way of a 6-French hexapolar EP catheter.  Venography  of the coronary sinus was carried out.  There was a posterior vein which  was suitable for LV lead placement.  There was a very small lateral  vein.  There was a large valve making it very difficult to get by at a  position approximately 3 o'clock in the LAO projection.  The valve was  traversed and mapping was carried out.  There were no satisfactory veins  along this area.  At this point, the decision was made to place the LV  lead in the posterior vein which was carried out successfully.  It  should be noted, however, that in the furthest ramification of the LV  lead in the vein, there was diaphragmatic stimulation on the proximal  coil and no capture bipolar.  After some consternation, the LV lead was  withdrawn slightly and at the new site, there was no diaphragmatic  stimulation and the LV  threshold both in the bipolar and unipolar modes  was 3 volts at 0.5 milliseconds.  A 10-volt pacing did not stimulate the  diaphragm in this location as it had done in the most distal portion of  the vein.  At this point, the sheath was removed and without particular  difficulty, the leads were secured to subpectoralis fascia with a figure-  of-eight silk suture.  The sewing sleeve was also secured with silk  suture.  Electrocautery was utilized to make subcutaneous pocket.  Kanamycin irrigation was utilized to irrigate the pocket.  Electrocautery was utilized to assure hemostasis.  The St. Jude Anthem  RF BiV pacemaker, serial [id number] was connected to the QuickFlex LV lead  as well as the bipolar RV lead and placed back in the subcutaneous  pocket with the generator secured with silk suture.  The pocket was  irrigated with kanamycin, and the incision was closed with 2-0 Vicryl  and 3-0 Vicryl.  Benzoin was painted on the skin.   Steri-Strips were  applied and a pressure dressing was placed and the patient was returned  to her room in satisfactory condition.   COMPLICATIONS:  There were no immediate procedure complications.   RESULTS:  Demonstrate successful implantation of a St. Jude  biventricular pacemaker in a patient with an ischemic cardiomyopathy,  syncope, and AFib with an uncontrolled ventricular response.  It should  be noted that the patient's chronic AFib made [place] not place an atrial  lead.  It should also be noted that the AV node ablation was not carried  out during this case nor was it attempted because of concerns that there  might possibly be LV lead dislodgement and felt most appropriate to let  the lead sit for 24 hours and make sure that there would be no early  dislodgement of the lead.      Doylene Canning. Ladona Ridgel, MD  Electronically Signed     GWT/MEDQ  D:  05/29/2008  T:  05/30/2008  Job:  [id number]   cc:   [name]. Jens Som, MD, [facility]  [name], M.D.

## 2010-09-09 NOTE — Discharge Summary (Signed)
[name], [name]                 ACCOUNT NO.:  [id number]   MEDICAL RECORD NO.:  [id number]          PATIENT TYPE:  INP   LOCATION:  4738                         FACILITY:  [facility]   PHYSICIAN:  [name]. Jens Som, MD, FACCDATE OF BIRTH:  1924/07/03   DATE OF ADMISSION:  02/29/2008  DATE OF DISCHARGE:  03/18/2008                               DISCHARGE SUMMARY   PRIMARY CARDIOLOGIST:  [name]. Jens Som, MD, [facility]   PRIMARY CARE PHYSICIAN:  [name], MD   DISCHARGING DIAGNOSES:  1. Permanent atrial fibrillation.  2. Left atrial clot.  3. Questionable bleeding disorder.  4. Anticoagulation therapy (new).  5. Chronic obstructive pulmonary disease.  6. Ovarian cyst pending follow up outpatient with Gynecology.  The      patient to arrange appointment with her gynecologist who has just      retired.  7. Hematuria status post workup this admission.  The patient underwent      a flexible cystoscopy at bedside by Dr. Isabel Caprice on March 08, 2008.  No evidence of active bleeding.  No evidence of obvious      pathology.  8. Ischemic cardiomyopathy, ejection fraction of 35% by echocardiogram      in October 2009.  9. Coronary artery disease status post coronary artery bypass graft.   Past medical history also pertinent for:  1. Diabetes, insulin dependent.  2. Hypertension.  3. Hyperlipidemia.  4. Hypothyroidism.  5. History of pneumonia.  6. History of asthma.  7. History of hernia repair.  8. Questionable bleeding disorders/history of von Willebrand disease.  9. Von Willebrand diagnosis status post consultation this admission by      Dr. Arline Asp.  Von Willebrand blood panel drawn.  10.Peripheral neuropathy.   PROCEDURES THIS ADMISSION:  1. Cardiac MRI on March 01, 2008.  The patient with a large thrombus      in the left atrial appendage by atrial enlargement.  EF 25-30%.  2. Ultrasound renal pelvis transvaginal on March 07, 2008.      Unremarkable renal ultrasound  examination.  Normal sonographic      appearance of bladder.  Transvaginal ultrasound showing a 4-cm      simple-appearing cystic lesion associated with the left      adnexa/ovary.  No comparison available to document stability.      Recommend pelvic MRI.  Outpatient follow up with GYN.  3. Chest x-ray on March 17, 2008, showed unstable cardiomegaly,      pulmonary vascular congestion, and COPD.  No acute findings.   CONSULTS THIS ADMISSION:  1. Dr. Arline Asp on March 01, 2008.  2. Dr. Hillis Range, Electrophysiology, on March 02, 2008, for tachy-      brady.  Initial plan was to proceed with permanent pacemaker.      Pacemaker put on hold secondary to bleeding risk and hematuria.  3. On March 06, 2008, by Dr. Isabel Caprice for gross painless hematuria      status post procedure as stated above.   HOSPITAL COURSE:  [name] is an 77 year old Caucasian female with a  lengthy hospitalization.  She has a history of persistent atrial fib,  coronary artery disease status post CABG, and ischemic cardiomyopathy,  who presented with symptomatic atrial fibrillation with rapid  ventricular response.  She reported progressive symptoms of shortness of  breath and fatigue.  Attempts at rate control recently have been very  difficult due to symptomatic bradycardia.  The patient currently has von  Willebrand disease and was not felt to be a candidate for Coumadin  therapy in the past.  A transesophageal echocardiogram confirmed a left  atrial appendage thrombus.  The patient is not a candidate for rhythm  control.  Initially, she was evaluated for consideration of her  permanent pacemaker by Dr. Johney Frame within the setting of bleeding  disorder and hematuria.  This was put on hold with plans for rate  control.  The patient initially presented to [facility] where she  had a CT done.  Dr. Jens Som reviewed CT from [facility] which  suggest renal infarcts in addition to left atrial  appendage thrombus.  Dr. Jens Som spoke with Dr. Arline Asp after consultation regarding  bleeding time/von Willebrand panel which was negative.  He felt there  was no evidence for von Willebrand disease at this time.  Dr. Arline Asp  felt there is no contraindication for anticoagulation therapy with  Coumadin.  Neurology was also asked to consult in regards to hematuria.  Hematuria workup negative.  The patient also found to have an ovarian  cyst which will be worked up outpatient.  Anticoagulation therapy with  Coumadin initiated.  Once anticoagulated, the patient also observed for  recurrent hematuria with plans to discharge the patient home when INR  between 2 and 3.  Also continued heparin until INR therapeutic.  The  patient tolerated rate control medications without problems.  By  March 18, 2008, the patient had became therapeutic on her Coumadin.  The patient is being discharged home with her initial Coumadin Clinic  visit on Wednesday, March 21, 2008.  The patient wishes to follow up  in [place] after her initial visit for further management of her  Coumadin therapy.  Also wishes to establish care with Dr. Olga Millers, previously was followed by Dr. Nona Dell, but does not  wish to drop to [place] to continue following up with Dr. Diona Browner.  The patient states she will schedule appointment with GYN of her choice  for followup questionable ovarian cyst as her GYN has recently retired.  For now, we will continue medications for rate control only.  On day of  discharge, the patient's PT/INR 24 and 2.0, H&H 13.1 and 40.4.  Last  BMET drawn on March 14, 2008, potassium 3.7, BUN 14, creatinine 0.9.   MEDICATIONS AT TIME OF DISCHARGE:  1. Warfarin 5 mg daily alternating with 7.5 mg every other day or as      instructed with a followup Coumadin Clinic visit on Wednesday,      office to call the patient to arrange time.  2. Synthroid 225 mcg daily.  3. Furosemide 20  mg daily.  4. Niacin 500 mg at bedtime.  5. Benicar 10 mg half a tablet daily.  6. Digoxin 0.125 daily.  7. Toprol-XL 100 mg daily.  8. Gabapentin 200 mg t.i.d.  9. Lantus Humalog insulin as previously prescribed.   The patient's doxazosin has been discontinued.  She will need an urine  recheck next week at Dr. Remus Blake office and then a follow up with a  gynecologist of her choice  for ovarian cyst evaluation.  The patient  will follow up with Dr. Jens Som within 2-3 weeks.  Office will call the  patient to arrange date and time.   LENGTH OF DISCHARGE SUMMARY:  Well over 30 minutes       Dorian Pod, ACNP      [name]. Jens Som, MD, [facility]  Electronically Signed    MB/MEDQ  D:  03/18/2008  T:  03/18/2008  Job:  [id number]   cc:   [name], M.D.  Valetta Fuller, M.D.  Samul Dada, M.D.

## 2010-09-09 NOTE — Op Note (Signed)
NAMECELENIA, HRUSKA                 ACCOUNT NO.:  [id number]   MEDICAL RECORD NO.:  [id number]          PATIENT TYPE:  INP   LOCATION:  3714                         FACILITY:  [facility]   PHYSICIAN:  John C. Madilyn Fireman, M.D.    DATE OF BIRTH:  [birth year]   DATE OF PROCEDURE:  08/14/2008  DATE OF DISCHARGE:                               OPERATIVE REPORT   INDICATIONS FOR PROCEDURE:  Rectal bleeding and the patient due for  colonoscopy this year due to personal history of colon polyps.   PROCEDURE:  The patient was placed in the left lateral decubitus  position and placed on the pulse monitor.  With continuous low-flow  oxygen delivered by nasal cannula, she was sedated with 75 mcg IV  fentanyl and 7 mg IV Versed.  The Olympus video colonoscope was inserted  into the rectum and advanced to the cecum, confirmed by  transillumination of McBurney's point and visualization of the ileocecal  valve and appendiceal orifice.  The prep was excellent.  The cecum,  ascending, transverse descending sigmoid and rectum all appeared normal  with no masses, polyps, diverticula, or other mucosal abnormalities.  There was no blood seen within the lumen at any level.  Retroflexed view  of the anus did reveal some small internal hemorrhoids.  The scope was  then withdrawn and the patient returned to the recovery room in stable  condition.  She tolerated the procedure well and there were no immediate  complications.   IMPRESSION:  Small internal hemorrhoids, otherwise normal study.   PLAN:  We will monitor for any further evidence of bleeding.           ______________________________  Everardo All. Madilyn Fireman, M.D.     JCH/MEDQ  D:  08/14/2008  T:  08/14/2008  Job:  [id number]   cc:   Fayrene Fearing L. Malon Kindle., M.D.

## 2010-09-09 NOTE — Assessment & Plan Note (Signed)
[facility]                             PULMONARY OFFICE NOTE   NAME:[name], [name]                        MRN:          [id number]  DATE:09/13/2006                            DOB:          1925/04/11    PROBLEM LIST:  1. Asthmatic bronchitis/chronic obstructive pulmonary disease.  2. Musculoskeletal chest wall pain.  3. Coronary disease/bypass/congestive failure (Dr. Diona Browner).  4. Diabetes.  5. Hypothyroid.  6. Gallbladder disease.  7. Von Willebrand's disease, preventative Coumadin use for paroxysmal      atrial fibrillation.  8. Pulmonary hypertension.  9. Pernicious anemia.   HISTORY:  She says breathing is never good but pretty much stable for  her now. She had a flare of bronchitis after she was in the hospital in  March and says it took her a couple of weeks to get out of bed. She now  talks about doing yard work, getting her pool ready where she lies on a  float in warm weather. She thinks the pollen is bothering her some with  the increased chest congestion but she is not coughing up anything as a  routine and there is little change from one day to the next. Pulmonary  functioning testing in January had included a 6 minute walk test which  she walked 396 meters in 6 minutes with oxygen saturation well  maintained 95-94% throughout. She had a hypertensive response to the  exercise with blood pressure 134/80 at baseline rising to 160/90 by end  of test and 2 minutes later 150/84. Heart rate went from 87 to 106 and 2  minutes later was 93. Her FEV1 was 1.65 (92%) with an FEV1/FEC ratio of  0.60 indicating moderate obstructive airways disease mainly in small  airways where there was mild response to bronchodilator. Diffusion was  mildly reduced at 68% of predicted and measured lung volumes were  normal. None of this suggests overwhelming pulmonary disease and implies  that an important part of her exertional dyspnea complaint is likely to  be a combination of heart disease and deconditioning.   MEDICATIONS:  1. Synthroid 0.025 mg b.i.d.  2. Micardis 80 mg.  3. Doxazosin 4 mg.  4. Insulin.  5. Vitamin B12.  6. Ambien 10 mg h.s. p.r.n.  7. Aspirin 81 mg.  8. Fish oil 1000 mg.  9. Flax seed oil.  10.Cranberry.  11.Cinnamon.  12.Multivitamins.  13.Niacin 25 b.i.d.  14.Cymbalta 30 mg.  15.Albuterol rescue inhaler used p.r.n.   Updated discharge medication list was not included in her discharge  summary.   OBJECTIVE:  VITAL SIGNS:  Weight 198 pounds, blood pressure 130/60,  pulse regular 86, room air saturation 94%.  CHEST:  Minimal chest congestion. Nonstop talking without evident  shortness of breath.  HEART:  Sounds regular without murmur. No peripheral edema.   IMPRESSION:  Moderate chronic obstructive pulmonary disease clinically  stable, possibly with a little bit of pollen related bronchial  congestion currently.   PLAN:  She complained of nasal congestion and is going to try Breathe  Right nasal strips again. No changes to  offer otherwise. She will keep  her cardiology appointment and schedule return with me in 6 months,  earlier p.r.n.     Clinton D. Maple Hudson, MD, [facility], FACP  Electronically Signed    CDY/MedQ  DD: 09/14/2006  DT: 09/14/2006  Job #: [id number]   cc:   [name], M.D.  James L. Malon Kindle., M.D.

## 2010-09-09 NOTE — H&P (Signed)
[name], UNCAPHER NO.:  [id number]   MEDICAL RECORD NO.:  [id number]          PATIENT TYPE:  EMS   LOCATION:  MAJO                         FACILITY:  [facility]   PHYSICIAN:  Ramiro Harvest, MD    DATE OF BIRTH:  1925-02-07   DATE OF ADMISSION:  12/07/2008  DATE OF DISCHARGE:                              HISTORY & PHYSICAL   CHIEF COMPLAINT:  Shortness of breath.   HISTORY OF PRESENT ILLNESS:  Ms. [name] is an 77 year old white female  with history of COPD and CHF who presents with complaints of wheezing  and shortness of breath last night.  She notes that she was unable to  sleep and developed increased wheezing with mild productive cough last  night.  Her cough is productive of frothy greenish sputum.  She denies  fever or chills.  She came to the emergency department for further  evaluation and treatment and we are asked to admit.   ALLERGIES:  1. ATORVASTATIN.  2. LABETALOL.  3. DICYCLOMINE.  4. SULFA.  5. CARBAMAZEPINE.  6. AMITRIPTYLINE.  7. BIAXIN.  8. TRILEPTAL.  9. AUGMENTIN.  10.ERYTHROMYCIN.  11.CEPACOL  12.ASPIRIN.   PAST MEDICAL HISTORY:  1. COPD.  2. Severe esophageal dysmotility.  3. UTIs.  4. Paroxysmal atrial fibrillation.  5. Coronary artery disease.  6. Ischemic cardiomyopathy with left ventricular ejection fraction of      55%, February 2010.  7. CHF.  8. Left atrial appendage thrombus.  9. Diabetes, type 2.  10.Hypertension.  11.Hyperlipidemia.  12.Von Willebrand syndrome.  13.Pulmonary hypertension.  14.Pernicious anemia.  15.Dementia.  16.Noncompliance.  17.Hypothyroid.   SURGICAL HISTORY:  1. AV node ablation with biventricular pacemaker placement, February      2010.  2. CABG x 4.  3. Hernia repair.   SOCIAL HISTORY:  The patient is married with four children.  She drinks  alcohol occasionally.  She quit smoking 20 years ago.  She is a retired  Engineer, civil (consulting).   FAMILY HISTORY:  Mom deceased secondary to CVA.  Dad  deceased secondary  to heart condition.   MEDICATIONS:  Retrieved from Dr. Remus Blake list as well as medications  brought by daughter:   1. Multivitamin 1 tab p.o. daily.  2. Klor-Con 10 mEq p.o. four times daily.  3. Crestor 10 mg p.o. daily.  4. Lantus 46 units subcu daily.  5. Exelon 4.6 mg per 24-hour patch to be changed daily.  6. Warfarin 5 mg p.o. 5 days a week and 7.5 mg p.o. 2 days a week.  7. LLovasa1 gram 2 caps p.o. twice daily.  8. Metoprolol ER 100 mg p.o. daily.  9. Benicar 5 mg p.o. daily.  10.Humalog 10 units subcu t.i.d. a.c. meals.  11.Synthroid 200 mcg p.o. daily.  12.Lasix 20 mg p.o. daily.  13.Advair Diskus 100/50 one inhalation twice daily.  Please note the      patient's pharmacy states last time this medication was filled was      2008.   REVIEW OF SYSTEMS:  GENERAL:  Denies fever or chills.  CARDIOVASCULAR:  Denies chest pain.  Does have some tightness with breathing and also  notes positive palpitations occasionally.  RESPIRATORY:  Positive  wheeze.  Productive cough of greenish frothy sputum.  GU: Denies dysuria  or hematuria.  MUSCULOSKELETAL: No muscle or joint pain.  SKIN:  No  rashes or lesions.  ENT: Denies sore throat or nasal congestion.  NEURO:  She wears glasses.  Notes chronic lower extremity numbness secondary to  neuropathy.  PSYCHIATRIC:  Denies depression or anxiety.  HEMATOLOGIC:  Denies bruises or bleed.   LAB/RADIOLOGY:  Hemoglobin 12.4, hematocrit 36.9, white blood cell count  5.4, platelets 188.  Sodium 141, potassium 4.5 chloride 104, bicarb 30,  BUN 10, creatinine 0.86, glucose 241.  Chest x-ray:  Pulmonary vascular  congestion with small right pleural effusion, likely mild fluid  overload.   PHYSICAL EXAM:  VITAL SIGNS:  BP 114/58, heart rate 84, respiratory rate  23, temperature 98, O2 sat 95% on room air.  GENERAL:  Awake, alert, elderly, obese white female, no acute distress.  NECK:  Supple.  No masses.  CARDIOVASCULAR:  S1,  S2, regular rate and rhythm.  RESPIRATORY:  Breath sounds with positive expiratory wheeze, right  greater than left.  Right lower lobe crackles are noted.  No increased  work of breathing.  Fair air movement at the bases.  ABDOMEN: Soft, nontender, nondistended.  Positive bowel sounds.  PSYCHIATRIC:  Alert and oriented x3, pleasant.  SKIN: No rashes noted.  Positive sun tan.   ASSESSMENT/PLAN:  1. Acute COPD exacerbation.  Will place the patient on empiric Avelox,      O2 2 liters nasal cannula, standing DuoNebs, add IV Solu-Medrol and      Mucinex p.r.n.  2. Ischemic cardiomyopathy/acute on chronic systolic heart failure,      left ventricular ejection fraction 55% per 2-D echo, March 2010.      Mild volume overload on chest x-ray.  We will resume home Lasix 20      mg p.o. daily which she takes on a p.r.n. basis, and place her on      strict Is and Os.  3. Diabetes, type 2.  Continue Lantus at home dose.  Sliding scale      coverage.  Continue meal coverage at decreased dose.  Expect sugars      to increase while on steroids.  4. History of pernicious anemia.  This appears stable.  Outpatient      follow-up.  5. History of von Willebrand's disease.  This is stable for outpatient      follow-up.  6. Dementia.  Continue Exelon patches.  This appears stable.  7. Hypothyroid.  Continue Synthroid.  TSH was within normal limits      June 2010.  We will repeat TSH.  8. Hyperlipidemia.  Continue Lovasa and statin.  Check FLP.  9. Hypertension.  BP is stable.  Continue Benicar.      [name], NP      Ramiro Harvest, MD  Electronically Signed    MO/MEDQ  D:  12/07/2008  T:  12/07/2008  Job:  [id number]   cc:   [name], M.D.

## 2010-09-09 NOTE — Consult Note (Signed)
[name], [name]                 ACCOUNT NO.:  [id number]   MEDICAL RECORD NO.:  [id number]          PATIENT TYPE:  INP   LOCATION:  2027                         FACILITY:  [facility]   PHYSICIAN:  John C. Madilyn Fireman, M.D.    DATE OF BIRTH:  24-Feb-1925   DATE OF CONSULTATION:  10/22/2008  DATE OF DISCHARGE:                                 CONSULTATION   REASON FOR CONSULTATION:  Dysphagia.   HISTORY OF PRESENT ILLNESS:  The patient is an 77 year old white female  admitted with presumed cardiac disease with some chest pain and some  swelling of her ankles.  Reports an ongoing history of fairly frequent  dysphagia for both solids and liquids but greater for solids and she  does mention foods like steak, chicken, and pork as being particularly  difficult.  However, she does state that this can occur for liquids  alone and she refers the area that the food appears to be sticking to  the suprasternal notch area.  She has never had any forced  regurgitation.  She has had a barium swallow done in April which showed  only some esophageal dysmotility with no obvious ring or stricture.  She  denies any history of reflux symptoms.  She is followed by Dr. Randa Evens  for GI issues including colon screening, although I did a colonoscopy on  her in April.  She apparently has had various degrees of dyspepsia in  the past and had some controversy regarding whether to remove her  gallbladder and this was eventually not done.  She also describes having  seen Dr. Jacqulyn Bath at [facility] in this regard.  From talking to her,  it is not clear to me whether any of this workup has involved dysphagia  and she is not sure whether she has had an EGD or esophageal dilatation.  She has not had any weight loss.   PAST MEDICAL HISTORY:  1. Congestive heart failure.  2. Hyperlipidemia.  3. Hypertension.  4. COPD.  5. Anemia.  6. Atrial fibrillation with history of rectal thrombus.   PHYSICAL EXAMINATION:  GENERAL:  A  well-developed, well-nourished white  female in no acute distress.  HEART:  Regular rate and rhythm without murmurs.  LUNGS:  Clear.  ABDOMEN:  Soft and nondistended with normoactive bowel sounds.  No  hepatosplenomegaly, mass, or guarding.   IMPRESSION:  Dysphagia, which does not clearly sound like an esophageal  ring or stricture, although it may well have do with the esophageal  transport in form of dysmotility.   PLAN:  I am not certain she would benefit from EGD with dilatation, but  we will review office records to see if this would appear appropriate.  We will hold n.p.o. after midnight and hold Lovenox in the morning in  the event we proceed.           ______________________________  Everardo All Madilyn Fireman, M.D.     JCH/MEDQ  D:  10/22/2008  T:  10/23/2008  Job:  [id number]   cc:   Fayrene Fearing L. Malon Kindle., M.D.  Noralyn Pick. Eden Emms, MD,  FACC

## 2010-09-09 NOTE — H&P (Signed)
[name], [name]                 ACCOUNT NO.:  [id number]   MEDICAL RECORD NO.:  [id number]          PATIENT TYPE:  INP   LOCATION:  3715                         FACILITY:  [facility]   PHYSICIAN:  Jonelle Sidle, MD DATE OF BIRTH:  02/22/1925   DATE OF ADMISSION:  04/08/2007  DATE OF DISCHARGE:                              HISTORY & PHYSICAL   CHIEF COMPLAINT:  Shortness of breath, racing heart and chest pain.   PRIMARY CARE PHYSICIAN:  [name], MD.   HISTORY OF PRESENT ILLNESS:  This is an 77 year old white female with a  history of coronary artery disease, preserved left ventricular ejection  fraction, asthma and paroxysmal A-fib who was last seen by Dr. Diona Browner  in clinic on April 04, 2007, with rapid atrial fibrillation.  At that  time he initiated diltiazem CD 180 mg p.o. daily.  She presented tonight  to the [facility] Emergency Department complaining of continued  racing heart, which she feels like has been made worse lately due to a  chest cold which has triggered wheezing, and a cough productive of thick  green sputum.  This has been going on for about the past week.  The  patient has had increased work of breathing, and minimal exertion is  leaving her completely exhausted, short of breath and with a racing  heart.  She also mentions that she had angina tonight while her heart  was racing, described as a pressure in the left arm radiating up to the  left jaw.  That pain has resolved.  The patient denies fevers.   PAST MEDICAL HISTORY:  1. Coronary artery bypass graft surgery in 2000.  2. Recent echo shows left atrium 42 mm and left ventricular ejection      fraction 60 to 65% with mild diastolic dysfunction.  3. Paroxysmal atrial fibrillation.  4. Von Willebrand disease which prohibits anticoagulation in this      patient.  5. Nuclear study in 2005 negative for ischemia with ejection fraction      of 68%.  6. Hypothyroidism.  7. Diabetes mellitus type  2.  8. Asthma.   SOCIAL HISTORY:  1. Lives in [place] with her husband.  2. She raised 4 children.  3. She used to smoke a lot but quit in 1986.   FAMILY HISTORY:  1. Her mother died.  She had a history of DVTs.  2. Father has died and he had a history of coronary heart disease.  3. The patient has 2 sisters with cancer; one breast, one ovarian.   ALLERGIES:  1. STATINS.  2. BIAXIN.  3. KEFLEX.  4. SULFA.  5. AUGMENTIN.  6. DICYCLOMINE.  7. CARBAMAZEPINE.   HOME MEDICINES:  1. Doxazosin 4 mg p.o. daily.  2. Lantus 40 units subcu daily.  3. Humalog as directed.  4. Aspirin 81 mg p.o. daily.  5. Omega 3, 1 gram p.o. b.i.d.  6. Niacin.  7. Cymbalta 30 mg p.o. b.i.d.  8. Synthroid 25 mcg p.o. daily.  9. Multivitamin.  10.B12.  11.Micardis 40 mg p.o. daily.  12.Diltiazem CD 180  mg p.o. daily.  13.Albuterol nebs.  14.Xopenex nebs.  15.Advair.  16.Spiriva.  17.Pulmicort nebs.   Of note, she is poorly compliant with her Advair and Spiriva.   REVIEW OF SYSTEMS:  Negative for fevers, chills.  Positive for headache,  sore throat and nasal discharge.  Positive for chest pain, shortness of  breath, dyspnea on exertion, palpitations, coughing and wheezing.  Positive for weakness and anxiety.  Positive for heat intolerance.  Otherwise, the balance of 14 systems is reviewed in detail and is  negative.   Initial physical exam at outside hospital revealed a temperature of  97.8, pulse of 131, respiratory rate of 22 and blood pressure 143/57.   INITIAL VITAL SIGNS AT :  Include a heart rate of 84 and  saturation of 95% on 2L.  GENERAL:  This is an older white female, she is very pleasant.  She is  audibly wheezing.  She is mildly dyspneic with speaking full sentences  but does not appear to be in extremis distress.  PUPILS:  Round and reactive.  Sclera are clear.  Extraocular movements  are intact.  DENTITION:  Good.  NECK VEINS:  Distended with a right atrial  pressure of approximately 12-  14 cmH2O.  NECK:  Supple, no carotid bruits, no cervical lymphadenopathy.  CARDIAC:  Rhythm is irregular, heart sounds are distant, no obvious  murmurs or gallops.  LUNGS:  Diffuse wheezing in all lung fields with prolonged expiratory  phase.  ABDOMEN:  Soft, nontender nondistended, normal bowel sounds.  EXTREMITIES:  No edema, no clubbing or cyanosis.  No rash.  No bruising  or petechiae.  MUSCULOSKELETAL:  There is mild kyphosis of the spine.  NEUROLOGIC:  Strength is 5/5 in all 4 extremities.  There is a mild  tremor of the hands.   DIAGNOSTIC TESTS:  Chest x-ray from outside hospital is a portable and  is poor quality.  There is the possibility of an infiltrate in the right  base.  Electrocardiogram from outside hospital shows atrial fibrillation  with a rate of 120 bpm.   LABS:  White blood cells 6.4, hemoglobin 12, sodium 140, potassium 4.3,  BUN 13, creatinine 0.8, glucose 164, AST 59, ALT 126.  CK 177, CK MB  2.5, troponin 0.01.   IMPRESSION:  This is an 77 year old white female with chronic  obstructive pulmonary disease, asthma predominant, who appears to have  an asthma exacerbation.  I suspect this has triggered her atrial  fibrillation with rapid ventricular response.  Likewise, I suspect her  angina was triggered probably by her tachycardia.   PLAN:  1. Admit to telemetry.  2. Cycle cardiac enzymes and electrocardiograms.  3. Continue aspirin therapy.  We will withhold anticoagulation, per      precedent, in this patient with history of von Willebrand disease.  4. Will increase the dose of her oral diltiazem to 240 mg daily and      will avoid beta-blockers.  She received a considerable amount of IV      and p.o. Lopressor at the outside hospital which I fear might be      exacerbating her wheezing.  5. She needs aggressive asthma treatment with Xopenex nebs, Advair,      Spiriva, oxygen and a prednisone taper.  6. Will check a good  PA & lateral chest x-ray and will initiate      quinolone antibiotic.  7. Check sputum culture.  8. Check BNP and transthoracic echo given her elevated JVD  and cardiac      history.  9. Check TSH.  10.Strict diabetes control with insulin.  11.It is noted that her AST and ALT are mildly elevated.  Etiology of      this is unclear, possibly fatty liver.  Will recheck these in the      morning.      Christell Faith, MD   Electronically Signed     ______________________________  Jonelle Sidle, MD    NDL/MEDQ  D:  04/09/2007  T:  04/10/2007  Job:  [id number]

## 2010-09-09 NOTE — H&P (Signed)
[name], [name]                 ACCOUNT NO.:  [id number]   MEDICAL RECORD NO.:  [id number]          PATIENT TYPE:  INP   LOCATION:  3303                         FACILITY:  [facility]   PHYSICIAN:  Michiel Cowboy, MDDATE OF BIRTH:  May 24, 1924   DATE OF ADMISSION:  08/10/2008  DATE OF DISCHARGE:                              HISTORY & PHYSICAL   PRIMARY CARE PHYSICIAN:  Dr. Lucianne Muss.   CARDIOLOGIST:  Dr. Jens Som.   PULMONOLOGIST:  Dr. Maple Hudson.   CHIEF COMPLAINT:  Bright red blood in toilet.   HISTORY OF PRESENT ILLNESS:  The patient is an 77 year old female with  past medical history significant for anemia and atrial fibrillation  currently on Coumadin and COPD for which she required frequent doses of  prednisone.  The patient today developed epigastric pain which was mild.  She laid down in the warm bath, and then after she got up, she wanted to  use the bathroom.  When she used the bathroom, she noticed blood in the  toilet, but not when she was wiping, so she figured that the blood was  coming with the stool.  She, otherwise, has not had any diarrhea or  constipation recently.  She is not lightheaded and has not had more  episodes besides that one.  She never had a history of bleeding like  this before, though she has history of hemorrhoids and occasionally has  history of a little bit of blood on the tissue when she wipes, but no  blood in the toilet bowl.  Her INR is currently 3.7.  [facility] hospitalist  was called for admission.   REVIEW OF SYSTEMS:  No chest pains.  No shortness of breath which is  above her baseline.  No fevers, no chills besides epigastric abdominal  pain.  Otherwise, no abdominal complaints.  Complaints otherwise  unremarkable.  Review of systems except for as HPI.   PAST MEDICAL HISTORY:  1. Anemia.  2. Atrial fibrillation.  3. Heart failure.  4. COPD.  5. Coronary artery disease status post CABG.  6. Diabetes.  7. Hypercholesteremia.  8.  Hypertension.  9. Hypothyroidism.   SOCIAL HISTORY:  The patient lives with her husband.  Does not drink  alcohol.  Does not smoke, does not abuse drugs.   FAMILY HISTORY:  Noncontributory.   ALLERGIES:  The patient is allergic to Atorvastatin, Augmentin, Biaxin,  carbamazepine, cefaclor, dicyclomine, labetalol, Zofran, Tegretol.   MEDICATIONS:  1. Synthroid 200 mcg daily.  2. Metoprolol 100 mg daily.  3. Vitamin B12.  4. Lantus 46 units at night.  5. Coumadin 2.5 mg daily.  6. Benicar 5 mg daily.  7. Lasix 40 mg daily.  8. Potassium 10 mEq daily.  9. Crestor 10 mg daily.  10.Lovaza 1000 mg four times a day.  11.Humalog.   PHYSICAL EXAMINATION:  VITALS:  Temperature 98.5, blood pressure 126/64, pulse rate  respirations 21, satting 96% on room air.  GENERAL:  The patient appears to be in no acute distress.  HEAD:  Nontraumatic.  NG tube in place.  LUNGS:  Occasional wheezes bilaterally but  fairly good air movement.  HEART:  Irregular regular but no murmurs appreciated.  ABDOMEN:  Soft.  There is slight epigastric tenderness and some obesity.  LOWER EXTREMITIES:  Without clubbing, cyanosis or edema.  NEUROLOGIC:  The patient appears to be intact.  SKIN:  Clean, dry, intact.   LABORATORY DATA:  White blood cell count 8.4, hemoglobin 12.1.  Sodium  140, potassium 5.1, creatinine 1.57 which are above her baseline of  0.94.  LFTs are slightly elevated.  EKG showed atrial  flutter/fibrillation, paced rate of 81.  Chest x-ray showing  cardiomegaly and COPD but otherwise unremarkable.   ASSESSMENT AND PLAN:  This is an 77 year old female with past medical  history of atrial fibrillation on Coumadin who now presents with bright  red blood per rectum.  1. Bright red blood per rectum.  Not quite clear if it is an upper      source or a lower source given epigastric pain and risk factors      such as steroid use in the past.  Will put in the step-down for      careful monitoring in  case the patient has severe bleeding,      although currently her H&H is stable and her vitals are stable.      She appears to be nontoxic but will watch carefully.  Will give FFP      to lower INR.  ED already wrote for vitamin K.  The patient      supposedly had history of atrial clot, and probably will need to      have Cardiology see her concerning what to be done with her      Coumadin, but for now will have to hold it.  Will need GI consult      first thing in the morning.  Will make n.p.o., give Protonix IV,      monitor number and color of stools and frequent CBCs.  2. History of atrial fibrillation.  Will hold Coumadin for right now.      Give metoprolol holding parameters.  3. History of heart failure.  In current state, her creatinine is up      and she may be having a GI bleed, so we will give IV fluids.  Hold      Lasix.  4. Acute renal failure likely secondary to dehydration.  Hold Lasix.      Check renal ultrasound.  Check urine lytes.  Urinary fluids.  5. History of hypothyroidism.  Continue Synthroid.  6. History of diabetes.  Will decrease Lantus while n.p.o. and watch      sliding scale insulin.  7. History of COPD.  Make sure the patient is an nebs.  Albuterol and      Atrovent.  8. Prophylaxis.  Protonix, SCDs when INR becomes subtherapeutic, for      now, it is supratherapeutic.  9. Heart failure in atrial fibrillation.  Will probably benefit from      Cardiology consult.  Discuss her Coumadin situation.      Michiel Cowboy, MD  Electronically Signed     AVD/MEDQ  D:  08/10/2008  T:  08/10/2008  Job:  [id number]   cc:   [name]. Jens Som, MD, [facility]  [name], M.D.

## 2010-09-09 NOTE — H&P (Signed)
[name], [name]                 ACCOUNT NO.:  [id number]   MEDICAL RECORD NO.:  [id number]          PATIENT TYPE:  INP   LOCATION:  2918                         FACILITY:  [facility]   PHYSICIAN:  [name], MDDATE OF BIRTH:  09-Sep-1924   DATE OF ADMISSION:  05/01/2008  DATE OF DISCHARGE:                              HISTORY & PHYSICAL   PRIMARY CARE PHYSICIAN:  Dr. Reather [name].   CHIEF COMPLAINT:  Generalized weakness, fatigue, not feeling well.  The  patient presents to the emergency department where she was noted to have  blood sugars in 690s range at which point [name] was called  for further evaluation and admission for severe hypoglycemia.  The  patient endorses for the past few days, for almost a week, she has been  an overall not feeling well, just generalized malaise, fatigue, feeling  almost like she is about to pass out when she stands up.  Of note, she  endorses that occasionally she gets chest pressure-like sensation when  she exerts herself.  She did not tell her cardiologist about this.   Otherwise no fevers, no chills, no nausea, no vomiting, no diarrhea, no  constipation.  Otherwise review of systems unremarkable.   PAST MEDICAL HISTORY:  Significant for:  1. History of ischemic cardiomyopathy EF of 35%.  2. Atrial fibrillation.  3. Atrial appendage clot.  4. Anemia.  5. Coronary artery disease status post coronary artery bypass.  6. Diabetes followed by Dr. Lucianne [name].  7. Hypertension.  8. Medical noncompliance.  9. Ovarian cyst.  10.COPD.   SOCIAL HISTORY:  The patient used to smoke but quit 25 years ago.  Does  not use drugs.  Does not drink alcohol.  Lives at home with her husband.   FAMILY HISTORY:  Noncontributory.   ALLERGIES:  Allergic to:  1. BIAXIN.  2. CARBAMAZEPINE.  3. CEFACLOR.  4. DICYCLOMINE.  5. SULFA.   MEDICATIONS:  1. Synthroid 250 mcg daily.  2. Toprol 100 mg daily.  3. Vitamin B12.  4. Calcium.  5.  Multivitamin.  6. Lantus 40 units subcutaneously q.a.m.Marland Kitchen  7. Coumadin 5 mg p.o. daily.  8. [name] 5 mg p.o. daily.  9. Potassium 10 mEq daily.  10.Crestor 10 mg daily.  11.Digoxin and 1-5 mcg daily.  12.Albuterol as needed for COPD.  13.Fish oil daily.  Of note, the patient is suppose take Advair but has not been taking.  Also was discharged on Lasix 20 mg daily and niacin 500 mg daily and I  am not sure she is actually taking any of that.   VITAL SIGNS:  Temperature 98.6.  Blood pressure 136/89.  Pulse 92.  Respirations 18, satting 100% on room air.  The patient appears to be currently in no acute distress.  HEAD:  Nontraumatic.  Dry mucous membranes.  Decreased skin turgor.  LUNGS:  Clear to auscultation bilaterally but was somewhat distant.  No  crackles noted.  HEART:  Irregular irregular but no murmurs could be appreciated.  ABDOMEN:  Obese but nontender, nondistended.  LOWER EXTREMITIES:  Without clubbing, cyanosis or edema.  Decreased skin  turgor.  Strength 5 out of 5.   LABS:  White blood cell count 9.0, hemoglobin 15.6, sodium 128,  potassium 4.1, creatinine 1.3 which is elevated from baseline of 0.9,  blood sugar now down to 599.  Digoxin level 0.3.  UA showing white blood  cell count 3 to 6, rare bacteria but also red blood cells 3 to 6 as well  and INR is 2.4.   CT scan of head showing no acute findings.  Chest x-ray showing  cardiomegaly and small right effusion.  EKG showing atrial fibrillation  with left bundle branch block.  The patient is currently chest pain  free.   ASSESSMENT/PLAN:  This is a pleasant 77 year old female with mild  dementia and uncontrolled diabetes who presents with elevated blood  sugars and clinical dehydration but she does have history of ischemic  cardiomyopathy.   ASSESSMENT:  1. Hyperglycemia.  We will for now place on Glucommander and      transition to Lantus.  The patient may have mild urinary tract      infection.  Will cover  for now with Cipro and await urine culture.  2. History of chest pressure at home.  This was worrisome for angina-      like symptoms, although she has not had for quite some time now.      We will cycle cardiac enzymes and recommend cardiology consult to      see if able to have a stress test at some point.  3. Atrial fibrillation.  Continue Coumadin, continue dig, metoprolol      with holding parameters.  4. Hyperlipidemia.  Continue Crestor.  5. Hypertension.  Hold Benicar.  Continue metoprolol.  6. Acute renal insufficiency likely dehydration.  We will give IV      fluids and follow.  Obtain renal ultrasound if no improvement.  7. Hypernatremia likely related to dehydration.  We will give IV      fluids.  Check TSH, check urine lines.  8. History of ischemic cardiomyopathy.  Currently somewhat on      dehydrated side.  We will hold Lasix, likely will need to restart      this.  9. Hypothyroidism.  We will continue Synthroid.  Check TSH.  10.Prophylaxis Protonix.  The patient already on Coumadin.   Code status:  The patient is full code.  I have discussed with her and  her family.      [name] Cowboy, MD  Electronically Signed     AVD/MEDQ  D:  05/01/2008  T:  05/02/2008  Job:  [id number]   cc:   [name], M.D.

## 2010-09-09 NOTE — Consult Note (Signed)
[name], [name]                 ACCOUNT NO.:  [id number]   MEDICAL RECORD NO.:  [id number]          PATIENT TYPE:  INP   LOCATION:  4711                         FACILITY:  [facility]   PHYSICIAN:  [name]. Jens Som, MD, FACCDATE OF BIRTH:  [birth year]   DATE OF CONSULTATION:  12/12/2008  DATE OF DISCHARGE:                                 CONSULTATION   [name] is an 77 year old female with a past medical history of  coronary artery disease, permanent atrial fibrillation, status post AV  node ablation and pacemaker placement, COPD, diabetes, history of left  atrial appendage thrombus, hypertension, hyperlipidemia, mild dementia,  and hypothyroidism who I am asked to evaluate for dyspnea.  Note, the  patient's last echocardiogram was performed on May 30, 2008.  At  that time, she had normal LV function.  There was trivial mitral  regurgitation.  There was mild left atrial and right ventricular  enlargement.  There was no pericardial effusion.  The patient was  admitted to [facility] on December 07, 2008, with complaints of  wheezing and dyspnea.  She also had a productive cough of greenish  sputum.  She does state that she has dyspnea on exertion.  There is  occasional orthopnea, but no PND.  She also states she did have some  pedal edema.  She occasionally feels pain in her chest with swallowing,  but does not have exertional chest pain.  She occasionally has  palpitations.  She was felt to have a COPD flare.  She was treated with  steroids, antibiotics, and bronchodilators.  Her dyspnea has improved  somewhat since admission, but she continues to have some difficulties  with this.  Because of her dyspnea, Cardiology was asked to further  evaluate.   PAST MEDICAL HISTORY:  Significant for diabetes mellitus, hypertension,  and hyperlipidemia.  She has a history of coronary artery bypass and  graft and had a previous ischemic cardiomyopathy improved by most recent  echocardiogram.  She has permanent atrial fibrillation and has had a  documented left atrial appendage thrombus in the past.  She also has had  prior AV node ablation and ICD placed.  She also has COPD.  There is  question history of von Willebrand disease.  She also has  hypothyroidism.  She has had previous mandibular reconstruction,  appendectomy, and hernia repair.  She had a history of small hemorrhoids  and esophageal dysmotility.  There is a history of mild dementia and  noncompliance.   SOCIAL HISTORY:  There is remote history of tobacco use, but she does  not smoke at present.  She does not consume alcohol.   Her family history is significant for father who died at age 75 from  coronary disease.  Her mother had a CVA.  She also had a sister who had  CVA.   REVIEW OF SYSTEMS:  She denies any fevers or chills.  She has had a  headache.  There has been a productive cough.  There is no hemoptysis.  She denies any melena or hematochezia.  She does have some pain  with  swallowing.  There is no dysuria or hematuria.  There are no rashes or  seizure activity.  She does complain of orthopnea, but there is no PND.  She does state she has had some pedal edema.  She also complains of  dizziness and palpitations at times.  The remaining systems are  negative.   PRESENT MEDICATIONS:  1. Coumadin as directed.  2. Albuterol and Atrovent.  3. Subcutaneous insulin.  4. Multivitamin.  5. Crestor 10 mg p.o. daily.  6. Exelon patch.  7. Lovaza.  8. Toprol 100 mg p.o. daily.  9. Benicar 5 mg p.o. daily.  10.Synthroid 200 mcg p.o. daily.  11.Lasix 20 mg p.o. daily.  12.Advair.  13.Avelox.  14.Prednisone 60 mg p.o. daily.  15.Guaifenesin as needed.   She has multiple drug allergies.   PHYSICAL EXAMINATION:  VITAL SIGNS:  Blood pressure of 149/93 and pulse  of 84.  Her temperature is 97.2.  GENERAL:  She is well developed and well nourished and does not appear  to be in acute  distress.  SKIN:  Warm and dry.  She does not appear depressed.  There is no  peripheral clubbing.  BACK:  Normal.  HEENT:  Normal with normal eyelids.  NECK:  Supple with normal upstroke bilaterally.  There is no bruit  noted.  Carotid upstroke is normal.  She does have jugular venous  distention.  CHEST:  Diminished breath sounds and there is rhonchi and mild  expiratory wheeze.  CARDIOVASCULAR:  Regular rate and rhythm.  I cannot appreciate murmurs,  rubs, or gallops.  ABDOMEN:  Nontender, nondistended.  Positive bowel sounds.  No  hepatosplenomegaly.  No masses appreciated.  There is no abdominal  bruit.  EXTREMITIES:  She has 2+ femoral pulses bilaterally without bruits.  Extremities showed trace edema over her ankles, but otherwise no edema.  I cannot palpate cords.  Her distal pulses are intact.  NEUROLOGIC:  Grossly intact as well.   LABORATORY DATA:  Sodium of 139, potassium 4.1.  BUN and creatinine are  25 and 1.02 respectively.  Her INR today is 2.2.  Her hemoglobin is 12.9  with hematocrit of 38.2.  Her white blood cell count is 8.7 with  platelet count of 245.  Multiple troponins have been normal.  Her TSH  was low at 0.331.  her initial BNP on December 07, 2008, was 460.  Head CT  showed atrophy and chronic ischemic white matter disease, but no acute  intracranial abnormality was noted.  Chest x-ray showed pulmonary  vascular congestion with a small right pleural effusion.  The patient  has not had an electrocardiogram.  She is placed on telemetry.   DIAGNOSES:  1. Dyspnea - this appears to be predominantly secondary to chronic      obstructive pulmonary disease.  She presented with dyspnea,      wheezing, and a productive cough.  I agree with continuing      pulmonary toilet including antibiotics, steroids, and      bronchodilators.  Her BNP was minimally elevated on admission, but      I do not find her significantly volume overloaded on examination.      I would  recommend continuing with her present dose of diuretic.      When she is discharged, she should also take potassium 10 mEq p.o.      daily.  2. Coronary artery disease - we would recommend continuing the beta-  blocker, ARB, and statin.  She is also on Coumadin.  I would not      add aspirin as there is a question history of von Willebrand's and      she does need Coumadin for atrial fibrillation and history of left      atrial appendage thrombus.  I do not want to put her on 2      anticoagulants.  3. Atrial fibrillation - she has had a prior AV node ablation and      pacemaker placed.  We would recommend continuing Coumadin with a      goal INR of 2-3.  She is at increased risk for this medication      given her history of mild dementia, but with history of left atrial      appendage thrombus, I think there is no choice unless she has falls      in the future.  4. Hyperlipidemia - she will continue on her statin.  5. Diabetes mellitus - management per primary care.  6. Hypertension - her blood pressure appears to be controlled on her      present medications.  7. Hypothyroidism - her TSH is diminished.  We would recommend      adjusting her Synthroid dose, but I will leave this to the primary      service.  8. Chronic obstructive pulmonary disease - as per #1.  9. Question history of von Willebrand disease.  10.Mild dementia.   Given that the present symptoms appear to be predominantly related to  COPD, we will not follow this patient while here in the hospital,  although we do appreciate the opportunity to participate in her care.  Please feel free to call if there are any questions or concerns.      [name] Jens Som, MD, [facility]  Electronically Signed     BSC/MEDQ  D:  12/12/2008  T:  12/12/2008  Job:  [id number]

## 2010-09-09 NOTE — Consult Note (Signed)
NAME:  [name], [name]                 ACCOUNT NO.:  [id number]   MEDICAL RECORD NO.:  [id number]          PATIENT TYPE:  INP   LOCATION:  3715                         FACILITY:  [facility]   PHYSICIAN:  Clinton D. Maple Hudson, MD, FCCP, FACPDATE OF BIRTH:  [birth year]Aug-1926   DATE OF CONSULTATION:  04/12/2007  DATE OF DISCHARGE:                                 CONSULTATION   Pulmonary Consultation   REFERRING PHYSICIAN:  Jonelle Sidle, MD   PROBLEM FOR CONSULTATION:  Wheezing and dyspnea.   HISTORY:  This is an 77 year old white female, former smoker, followed  in my medical office practice.  She gives history that she has had  increased wheezing congestion initially with green sputum starting about  2 weeks ago, and improving only slowly with hospital care.  She  presented for hospital evaluation because of tachy palpitation, and was  found to be in rapid atrial fibrillation.  She had had some angina.   Previous office evaluation has found her usually to have some wheeze and  congestion on examination.  She has not been oxygen-dependent.  Pulmonary function testing was done last winter finding normal FEV-1,  mildly reduced FEV-1/FVC ratio, normal total lung capacity, and mildly  reduced diffusion capacity with little response to bronchodilator.  On a  6-minute walk test she maintained oxygen saturation in the 92%-95% range  on room air although she complained of shortness of breath.  Exertional  dyspnea has been attributed primarily to obesity with deconditioning,  and limited cardiac reserve with consideration of that pulmonary  function test results.   She has been home medicated with a nebulizer machine using Xopenex 1.25  mg t.i.d. and Pulmicort 0.5 mg b.i.d.  She has also used a metered  inhaler with albuterol.  She prefers albuterol, but recognize that it  gives her tremor.  I suspect, although she cannot quantitate, that she  was using her albuterol more aggressively in the early days  of the acute  illness, this time.  Currently she says she is not coughing sputum.  Hospital therapy now is based on Advair 250/50, Spiriva and prednisone  20 mg daily.   REVIEW OF SYSTEMS:  She denies chest pain, blood, or fever.  She is no  longer producing any sputum.  She specifically denies any suggestion of  reflux or heartburn, and has noticed only very occasional dysphagia if  she is careless while drinking a thin liquid.  She is not feeling  postnasal discharge.   PAST MEDICAL HISTORY:  1. Coronary artery bypass graft/coronary disease.  2. Diastolic dysfunction with ejection fraction 60%-65%.  3. Paroxysmal atrial fibrillation.  4. Von Willebrand disease.  5. Negative nuclear stress test for ischemia in 2005.  6. Hypothyroid.  7. Diabetes mellitus type 2 (Dr. Lucianne Muss).  8. Asthma/asthmatic bronchitis.  Environmental triggers have included      weather change, virus infections, strong odors such as perfumes,      and outdoor pollen exposure especially when grass is being cut.   SOCIAL HISTORY:  She had quit smoking about 19 years ago.  Married.  FAMILY HISTORY:  Mother had DVT.  Father had bronchitis and died of  coronary disease.   PHYSICAL EXAM:  VITAL SIGNS:  BP 117/71, pulse rate has ranged from 85-  132 over the last 24 hours.  Respiratory rate 16-18, temperature 98.2,  oxygen saturation 97% on 2 liters.  GENERAL:  She is an obese elderly white woman alert and oriented.  She  recognized me as I entered the room air.  SKIN:  No rash.  ADENOPATHY:  None in supraclavicular or axillary areas.  HEENT AND NECK:  Oral mucosa normally hydrated.  Speech is clear.  There  is no stridor.  I cannot feel thyromegaly.  CHEST:  Diffuse coarse breath sounds with wheezy rhonchi generally.  Breathing is unlabored.  Expiratory phase is short.  There is an  intermittent mild dry cough.  HEART:  Sounds currently are irregularly irregular without murmur or  gallop audible.  ABDOMEN:   Softly obese with no palpable hepatosplenomegaly.  EXTREMITIES:  Without edema, cyanosis, or clubbing.   CHEST X-RAY:  Reviewed by me, cardiomegaly, vascular congestion, small  right pleural effusion unlikely to be mechanically significant.   IMPRESSION:  Acute exacerbation of chronic asthmatic bronchitis.  The  fixed chronic obstructive pulmonary disease component is relatively  mild.  The acute illness was probably triggered by a viral respiratory  illness, and is following a predictable slow course.  Medications in  hospital have been appropriate.  Our best option is to try to maximize  inhaled steroid therapy, follow up may prove that she needs more  systemic steroid to make real progress.  She looks as if she is at risk  for aspiration, although she has been counseled and evaluated for this  in the past and firmly denies recognizing symptoms.  I am going to try  adding additional inhaled steroids, while continuing her other  therapies; and we will evaluate her over the next few days.  Recognize  that any aggravation of interstitial edema is also likely to contribute  to chest congestion and dyspnea.  Hopefully as her cardiac status  settles down, we will see a benefit from that direction as well.      Clinton D. Maple Hudson, MD, [facility], FACP  Electronically Signed     CDY/MEDQ  D:  04/12/2007  T:  04/13/2007  Job:  [id number]   cc:   Thomas C. Daleen Squibb, MD, [facility]  [name], M.D.

## 2010-09-09 NOTE — Op Note (Signed)
NAMEARLISSA, MONTEVERDE                 ACCOUNT NO.:  [id number]   MEDICAL RECORD NO.:  [id number]          PATIENT TYPE:  INP   LOCATION:  2003                         FACILITY:  [facility]   PHYSICIAN:  Hillis Range, MD       DATE OF BIRTH:  Apr 24, 1925   DATE OF PROCEDURE:  DATE OF DISCHARGE:                               OPERATIVE REPORT   SURGEON:  Hillis Range, MD   PREPROCEDURE DIAGNOSIS:  Refractory atrial fibrillation with rapid  ventricular rates.   POSTPROCEDURE DIAGNOSIS:  Refractory atrial fibrillation with rapid  ventricular rates.   PROCEDURES:  1. EP study with recording from the right atrium, right ventricle, and      His bundle locations.  2. Fluoroscopic evaluation of the patient's biventricular pacemaker      leads.  3. Biventricular pacemaker interrogation and reprogramming.  4. AV nodal ablation.   INTRODUCTION:  Ms. Marcelli is a pleasant 77 year old female with  medically refractory atrial fibrillation and rapid ventricular rates.  She has had difficulty with tachycardias as well as bradycardia's long  term, despite medical therapy with metoprolol, digoxin, and diltiazem at  very high doses.  She underwent biventricular pacemaker implantation by  Dr. Lewayne Bunting on May 30, 2007.  She now presents for AV nodal  ablation.   DESCRIPTION OF PROCEDURE:  Informed written consent was obtained and the  patient was brought to the electrophysiology lab in the fasting state.  She was noted to be in atrial fibrillation with rapid ventricular rates  upon presentation.  The patient's pacemaker was interrogated and  confirmed to be functioning appropriately in the VVI pacing mode.  The  right ventricular pacing threshold was 0.5 V at 0.4 msec.  The left  ventricular pacing threshold was 1.75 V at 0.8 msec.  The device was  programmed to VVI at 40 beats per minute.  The patient's underlying  rhythm was atrial fibrillation.  The average ventricular rate was 128  beats per  minute.  The QRS width was 70 msec and the QT interval  measured 300 msec.  Using a percutaneous Seldinger technique, one 8-  [place] hemostasis sheath was placed into the right common femoral vein.  Prior to catheter introduction, a fluoroscopic evaluation of the  patient's pacemaker leads was performed.  This revealed a stable  position of the right ventricular pacing lead.  However, the left  ventricular pacing lead had dislodged into a more proximal portion of  the coronary sinus body, but appeared to be in stable position.  A 7-  [employer] 4-mm ablation catheter was therefore,  introduced through the right common femoral vein and advanced into the  right atrium for recording in the patient.  This documented only atrial  fibrillation.  The ablation catheter was therefore advanced into the  right ventricle.  The catheter was placed into the right ventricle for  recording and pacing.  The catheter was then pulled back into the His  bundle location.  The HV interval measured 58 msec at baseline.  The  ablation catheter was positioned over the His  bundle such that a small  atrial electrogram was recorded from the proximal pole and a prominent  His and ventricular signal were recorded from the distal pole.  Radiofrequency current was delivered in this location at 50 watts for  target temperature of 60 degrees for 60 seconds.  An additional bonus  radiofrequency application was also delivered at 50 watts for 60  seconds.  Following ablation, the QRS duration increased to 120 msec and  no His electrogram was observed.  The RR interval measured 1492 msec.  The patient was observed for 30 minutes without return of AV conduction.  Ventricular pacing was performed through the patient's pacemaker at 80  beats per minute for 5 minutes.  Ventricular pacing was then  discontinued and the patient continued to have complete heart block with  a very regular infra-Hisian escape  rhythm at 1492 msec.  At this point,  multiple pacing configurations from the left ventricular electrode of  the patient's pacemaker was performed with bipolar, tip to ring, and  true unipolar configurations.  These continued to reveal a left bundle-  branch QRS morphology with a QRS 142 msec.  It was therefore concluded  that the left ventricular coronary sinus lead was capturing the right  ventricle.  I therefore, elected to program the patient's biventricular  pacemaker VVI at 90 beats per minute with right ventricular pacing only.  Should the patient have progressive symptoms of heart failure with the  right ventricular pacing, then an epicardial left ventricular lead may  be required in the future.  No other changes were made today.  The  procedure was therefore considered completed.  All catheters were  removed and the sheaths were aspirated and flushed.  The sheaths were  removed and hemostasis was assured.  There were no early apparent  complications.   CONCLUSIONS:  1. Medically refractory atrial fibrillation with rapid ventricular      rates.  2. Stable right ventricular pacemaker lead positioned with an optimal      pacing threshold of 0.5 V at 0.4 msec.  The left ventricular      coronary sinus lead had dislodged proximally into the body of the      coronary sinus prior to the ablation procedure and was captured in      the right ventricle only.  3. Successful AV nodal ablation with an infra-Hisian escape rhythm at      40 beats per minute following ablation.  4. No early apparent complications.      Hillis Range, MD  Electronically Signed     JA/MEDQ  D:  05/30/2008  T:  05/31/2008  Job:  [id number]   cc:   Doylene Canning. Ladona Ridgel, MD

## 2010-09-09 NOTE — H&P (Signed)
[name], [name]                 ACCOUNT NO.:  [id number]   MEDICAL RECORD NO.:  [id number]          PATIENT TYPE:  INP   LOCATION:  3703                         FACILITY:  [facility]   PHYSICIAN:  Vernice Jefferson, MD          DATE OF BIRTH:  08-08-1924   DATE OF ADMISSION:  04/02/2008  DATE OF DISCHARGE:                              HISTORY & PHYSICAL   PRIMARY CARDIOLOGIST:  [name]. Jens Som, MD, [facility]   CHIEF COMPLAINT:  Shortness of breath and chest fullness.   HISTORY OF PRESENT ILLNESS:  The patient is an 77 year old white female  with history of coronary artery disease (status post CABG in 2000 with  LIMA to LAD, sequential saphenous vein graft to OM and OM1 and 2 and  saphenous vein graft to RCA), chronic persistent atrial fib with left  atrial clot diagnosed at last hospitalization, and left ventricular  systolic dysfunction with EF of approximately 30-40% based on several  different studies who comes in with complaints of dyspnea mostly with  exertion, but at rest as well.  She reports that these symptoms have  been going on for 24 hours, has also had some abdominal and chest  fullness which has been present for the same period of time.  The  patient according to her daughter had increasing dementia and a  noncompliance of medications and she does not think she has been taking  her pills for the past week.  On arrival to the ED, she was found to be  in mild congestive heart failure and she is being admitted for  exacerbation.   PAST MEDICAL HISTORY:  1. Coronary artery disease, status post CABG in 2000 as detailed      above.  2. Permanent AFib.  3. Chronic left ventricular systolic dysfunction with EF of      approximately 35-40%.  4. History of left atrial clot, supposedly on chronic Coumadin      therapy.  5. Diabetes.  6. Hypertension.  7. Hyperlipidemia.   SOCIAL HISTORY:  Lives alone with her husband.  No tobacco, no alcohol,  and no drug use.   FAMILY HISTORY:   Reviewed and noncontributory to the patient's current  medical condition.   ALLERGIES:  No known drug allergies.   MEDICATIONS:  1. Coumadin 7.5/5 alternating daily.  2. Synthroid 225 mcg daily.  3. Lasix 20 mg a day.  4. Niacin 500 mg nightly.  5. Benicar 5 mg a day.  6. Digoxin 0.125 mcg a day.  7. Toprol-XL 100 mg a day.  8. Neurontin 300 mg t.i.d.  9. Lantus unknown dosing.   REVIEW OF SYSTEMS:  Negative 11-point review of systems except for those  dictated in above HPI.   PHYSICAL EXAMINATION:  VITAL SIGNS:  Blood pressure 134/70, heart rate  of 93, and T-max afebrile.  GENERAL:  Well-developed, well-nourished white female in no acute  distress.  HEENT:  Moist mucous membranes.  No scleral icterus or conjunctival  pallor.  NECK:  Supple.  Full range of motion.  Jugular venous pressure estimated  approximately  15 cm of water.  CARDIOVASCULAR:  Regular rate and rhythm with 2/6 holosystolic murmur at  the left lower sternal border but otherwise no rubs or gallops.  CHEST:  Mild basilar rales.  SKIN:  No rash and no lesion.  ABDOMEN:  Soft, nontender, and nondistended.  Normoactive bowel sounds.  EXTREMITIES:  No peripheral edema.  Pulses are 2+ bilaterally.  NEUROLOGIC:  Nonfocal.  Alert and oriented x3.   RADIOLOGIC STUDIES:  Chest x-ray demonstrates mild pulmonary vascular  congestion with mild pulmonary edema.  EKG, AFib with nonspecific ST and  T-wave abnormality inferolaterally, unchanged from prior ECG.   LABORATORY DATA:  Hemoglobin 12.3, 237 platelets, BUN and creatinine of  13 and 1.2, and glucose of 190.  First set of cardiac biomarkers are  within normal limits.  BNP of 458.   IMPRESSION:  1. Acute left ventricular systolic dysfunction secondary to medication      noncompliance.  2. Chronic persistent atrial fibrillation.  3. History of left atrial clot.  4. Coronary artery disease.  5. Diabetes.  6. Hypertension.   PLAN:  Admit the patient to  telemetry.  Lasix and Lovenox have given in  the ED with some improvement in her symptoms.  I will restart her home  meds and continue the Lovenox dosing for her intracardiac thrombosis.  We will follow her clinically, may possibly need a Neuro evaluation for  worsening dementia, might be multi-infarct versus some form of  Alzheimer's.  We will hold Neurontin at this time.      Vernice Jefferson, MD  Electronically Signed     JT/MEDQ  D:  04/03/2008  T:  04/03/2008  Job:  [id number]

## 2010-09-09 NOTE — Consult Note (Signed)
[name], [name]                 ACCOUNT NO.:  [id number]   MEDICAL RECORD NO.:  [id number]          PATIENT TYPE:  INP   LOCATION:  2027                         FACILITY:  [facility]   PHYSICIAN:  Noralyn Pick. Eden Emms, MD, FACCDATE OF BIRTH:  1924-11-28   DATE OF CONSULTATION:  DATE OF DISCHARGE:                                 CONSULTATION   An 77 year old patient we are asked to see for chest pain.  The patient  was admitted to the hospital yesterday.  We are asked to see her for  chest pain, but in talking to the patient she denies any now.  She  apparently had a fall and hurt her right shoulder.  She was concerned  about ongoing left foot pain.  The main reason she came in was for  assessment of these.  She does have a complicated cardiac history.  She  has had a previous cardiomyopathy.  She has had bypass surgery in 2000.  She has had a history of atrial arrhythmias.  She had a left atrial  appendage clot seen by MRI in 2009 and she subsequently had a BiV pacer  placed in the left-side.  The lead is dislodged and she had an ablation  of her AV node by Dr. Johney Frame.  She is chronically in AFib, on Coumadin  anticoagulation with ventricular pacing now.  She does note that she has  had a little bit of nausea and some epigastric pain and fullness.  She  has some mild palpitations at baseline.  Her INR was subtherapeutic on  admission to the hospital.   She was recently in the hospital with a rectal bleed.  I saw her at that  time.  Dr. Madilyn Fireman did a colonoscopy and did not find anything too bad.  She did not have an upper endoscopy at that time.   She has also had some general weakness and malaise.  She has been sore  since her fall 2 days ago.   Her review of systems is otherwise negative.   She is allergic to ZOCOR, AUGMENTIN, BIAXIN, CARBAMAZEPINE, CEFACLOR,  DICYCLOMINE, LABETALOL, SULFUR, TEGRETOL, and BIAXIN.   PAST MEDICAL HISTORY:  Remarkable for recent rectal bleeding with  hemorrhoids, COPD, esophageal dysmotility, UTIs, history of atrial  fibrillation with AV node ablation, pacemaker therapy, history of bypass  in 2000, history of cardiomyopathy, most recent EF low normal, diabetes,  hypertension, hyperlipidemia, previous history of falls, history of mild  dementia, and hypothyroidism.   Her home medications include:  1. Synthroid 200 mcg a day.  2. Lasix 20 a day.  3. Benicar 5 a day.  4. Coumadin as directed.  5. Lopressor 100 a day.  6. Crestor 10 a day.  7. Potassium.  8. Lantus with sliding scale.   The patient is married.  She does not drink or smoke.  She does all  activities of daily living.   FAMILY HISTORY:  Noncontributory.   PHYSICAL EXAMINATION:  GENERAL:  Remarkable for an elderly female in no  distress.  VITAL SIGNS:  Blood pressure is 109/67, pulse 85 and regular,  respiratory rate 14, afebrile, room air sats 94%, and temperature 96.2.  HEENT:  Unremarkable.  NECK:  Carotids are normal without bruit.  No lymphadenopathy,  thyromegaly, or JVP elevation.  LUNGS:  Clear with good diaphragmatic motion.  No wheezing.  Pacer  around the clavicle in good position.  HEART:  S1 and S2 with a soft systolic murmur.  PMI normal.  ABDOMEN:  Benign.  Bowel sounds positive.  No AAA.  No tenderness.  No  bruit.  No hepatosplenomegaly or hepatojugular reflux.  EXTREMITIES:  Distal pulses are intact with varicosities.  No  significant edema.  NEURO:  Nonfocal.  SKIN:  Warm and dry.  MUSCULOSKELETAL:  No muscular weakness.   EKG shows AFib with ventricular pacing.  INR is 1.5, potassium 4.8, and  creatinine 0.9.  Cardiac panel and troponin are negative.  Amylase is  normal.  BNP 280.   Chest x-ray shows no acute findings with cardiac enlargement, dual lead  pacemaker.   IMPRESSION:  1. Recent falls with question of fatigue, malaise, and possibly      musculoskeletal chest pain.  Cardiac enzymes are negative.  The      patient currently  not complaining of any such pain despite having      bypass grafts in 2000.  I do not think she needs acute workup here      in the hospital.  She can consider having an outpatient adenosine      Myoview in followup with Dr. Jens Som.  We would continue aspirin      and beta-blocker therapy.  2. Atrial arrhythmias, currently stable, rate controlled.  3. Chronic atrial fibrillation, on anticoagulation with pacemaker      backup.  4. History of cardiomyopathy, currently only mildly elevated BNP,      appears euvolemic.  Continue current dose of medication.  Consider      adding low-dose diuretic.  Continue current dose of Lasix at 20 mg      a day.  5. Hypertension, currently well controlled.  Continue current dose of      ACE inhibitor.   We would be happy to follow the patient along here in the hospital, but  I do not think she needs any further inpatient cardiac workup.      Noralyn Pick. Eden Emms, MD, [facility]  Electronically Signed     PCN/MEDQ  D:  10/20/2008  T:  10/20/2008  Job:  [id number]

## 2010-09-09 NOTE — Discharge Summary (Signed)
[name], [name]                 ACCOUNT NO.:  [id number]   MEDICAL RECORD NO.:  [id number]           PATIENT TYPE:   LOCATION:                                 FACILITY:   PHYSICIAN:  [name]. Jens Som, MD, FACCDATE OF BIRTH:  October 31, 1924   DATE OF ADMISSION:  DATE OF DISCHARGE:                               DISCHARGE SUMMARY   ADDENDUM   The patient was not discharged as initially planned, secondary to  inclement weather.  During the following 2 days, her pacemaker was re-  interrogated, suggestive of some discontinuity, and VVI was turned down  to 40 bpm, to try to demonstrate intrinsic conduction after AV nodal  ablation, and there was none.  The patient also was treated with p.r.n.  Zofran for some nausea and vomiting.  Her symptoms improved and she was  cleared for discharge 2 days later, by Dr. Jens Som, on June 02, 2008.  He also suggested that her sutures be removed this coming Monday,  with arrangements made through [facility].  The patient is to  otherwise follow up with Dr. Ladona Ridgel and Dr. Jens Som, as had been  previously scheduled.  Arrangements have also been made for her to  follow up in the Coumadin Clinic on Tuesday, June 05, 2008.      Gene Serpe, PA-C      [name]. Jens Som, MD, [facility]  Electronically Signed    GS/MEDQ  D:  06/02/2008  T:  06/02/2008  Job:  [id number]

## 2010-09-09 NOTE — Discharge Summary (Signed)
NAMELIBERTA, [name]                 ACCOUNT NO.:  [id number]   MEDICAL RECORD NO.:  [id number]          PATIENT TYPE:  INP   LOCATION:  4711                         FACILITY:  [facility]   PHYSICIAN:  Corinna L. Lendell Caprice, MDDATE OF BIRTH:  1925-03-06   DATE OF ADMISSION:  12/07/2008  DATE OF DISCHARGE:  12/19/2008                               DISCHARGE SUMMARY   ADDENDUM   The patient's discharge was held secondary to episode of hypotension.  Her Benicar has been discontinued.  We also held her a.m. dose of Lasix  today.  Blood pressure is improved.  In addition, the patient had a  supratherapeutic INR of 4.1 in setting of a concomitant fluoroquinolone  treatment.  Her Avelox has been discontinued.  She did receive a small  dose of vitamin K, and we plan to hold Coumadin tonight, INR is 3.1, and  resume at home dose tomorrow night.  We have also asked home health RN  to check PT/INR on Monday, December 24, 2008, and call these results to  [facility].  The patient did have one low blood sugar  reading at 2200 last night of 59.  This was following a sugar of 375  prior to that.  We will plan a quick prednisone taper, which will be 40  mg p.o. tomorrow, 30 mg p.o. following day, then 20 mg p.o. x1, and 10  mg p.o. x1, then stop.  This will hopefully assist in preventing such  labile blood sugars.      [name], NP      Corinna L. Lendell Caprice, MD  Electronically Signed    MO/MEDQ  D:  12/19/2008  T:  12/20/2008  Job:  [id number]   cc:   [name], M.D.

## 2010-09-09 NOTE — H&P (Signed)
[name], [name]                 ACCOUNT NO.:  [id number]   MEDICAL RECORD NO.:  [id number]          PATIENT TYPE:  INP   LOCATION:  2003                         FACILITY:  [facility]   PHYSICIAN:  [name] Sans. Wall, MD, FACCDATE OF BIRTH:  January 24, 1925   DATE OF ADMISSION:  05/25/2008  DATE OF DISCHARGE:                              HISTORY & PHYSICAL   PRIMARY CARDIOLOGIST:  [name] Frieze. Jens Som, MD, [facility]   PRIMARY CARE [name]:  [name] Littler, MD   PATIENT PROFILE:  This is an 83-year Caucasian female with history of  CAD, ischemic cardiomyopathy, and chronic AFib who presents with  weakness orthostasis, status post fall.   1. Weakness.  2. Nausea.  3. CAD.  3A.  Status post CABG x4 in 2000.  Sequential vein graft to the OM1 and  OM2.  Vein graft to the RCA.  3B.  On April 29, 2007, adenosine Myoview, EF 43% moderate global  hypokinesis.  No ischemia.  1. Ischemic cardiomyopathy/chronic systolic CHF.  4A.  On February 17, 2008, 2-D echocardiogram, EF 35% inferior and septal  hypokinesis, akinesis, and global hypokinesis.  Mild MR and moderate TR.  Mild-to-moderate decrease in RV function.  1. Chronic atrial fibrillation.  2. History of left atrial appendage clot.  3. Chronic Coumadin therapy.  4. Hypertension.  5. Diabetes mellitus.  6. Pernicious anemia.  7. Hyperlipidemia.  8. Noncompliant.  9. COPD.  10.Hypothyroidism.  11.Von Willebrand disease.  12.Pulmonary hypertension.  13.Status post hernia repair.  14.Mild dementia.   HISTORY OF PRESENT ILLNESS:  This is an 77 year old Caucasian female  with the above problem list.  She last saw Dr. Antoine [name] as an add on  May 09, 2008, for complaints of nausea and weakness.  She was  offered admission, but refused.  She reports about a 21-month history of  nausea and weakness and she was hospitalized in December and again in  January.  Because of ongoing nausea, last week Dr. Lucianne [name] initiated on  her medication, which she is not  sure of the name.  After she took it,  she apparently became disoriented and her daughter took her to [facility].  She has no recollection of the details surrounding that  hospitalization and has not even sure if she was admitted.  Since she  has been home from the hospital, throughout this week, she has continued  of nausea and weakness.  She saw Dr. Lucianne [name] yesterday afternoon and  complained of dizziness.  Apparently, she was felt to be orthostatic and  was told to increase her sodium intake.  Last night, she woke up in the  middle of the night and was confused and not sure where she was.  She is  not sure how she got out of bed, but she apparently was up in a room and  fell.  She does not remember if she was dizzy or lightheaded prior to  fall, but it is certain that she did not lose consciousness.  She struck  her head on something.  She is not sure and then also on her right side.  She had pain in both areas and called her daughter who is now living  with her.  Her daughter took her to [facility].  She had a  laceration on her right forehead, which has been sutured.  A CT of head  and thoracic spine was performed normal.  She continues to complain of  pain along her right back.  She had lab work at [facility] that showed no  acute findings.  Her family asked that she be transferred here.  Currently, she complains of right-sided pain, but otherwise has no  complaints.   ALLERGIES:  1. SULFA.  2. AUGMENTIN.  3. BIAXIN.  4. KEFLEX.   HOME MEDICATIONS:  1. Levothyroxine 250 mcg daily.  2. Benicar 5 mg daily.  3. K-Dur 10 mEq daily.  4. Crestor 10 mg daily.  5. Lasix 40 mg daily.  6. Coumadin 5 mg at bedtime.  7. Toprol-XL 100 mg daily.  8. Digoxin 0.125 mg daily.  9. Omega 3 daily.  10.Vitamin B12 daily.  11.Vitamin D daily.  12.Lantus 40 units daily.  13.Sliding scale insulin t.i.d. a.c.   FAMILY HISTORY:  Mother died of CVA at 76.  Father died of CAD at 39.   She had 4 brothers and 3 sisters.  There is a history of coronary artery  disease, stroke, and diabetes in her siblings.   SOCIAL HISTORY:  She lives in [place] with her daughter.  She is  retired.  She is less than one-pack year history of tobacco abuse and  quit 25 years ago.  She denies alcohol or drugs.  She is not exercising.   REVIEW OF SYSTEMS:  Positive for right forehead laceration and positive  chronic shortness of breath.  She has had some dysuria as well as  vaginal burning.  She has nausea as outlined in the HPI.  She has  constipation.  Otherwise all systems negative.   PHYSICAL EXAMINATION:  GENERAL:  At this time, vital signs are pending.  Pleasant white female, in no acute distress.  Awake, alert, and oriented  x3.  PSYCHIATRY:  Normal affect.  HEENT:  Normal with exception of a right forehead laceration, which has  been sutured.  NEUROLOGIC:  Grossly intact, nonfocal.  SKIN:  Warm and dry with lacerations above.  MUSCULOSKELETAL:  Grossly normal without deformity or effusion.  NECK:  No bruits or JVD.  LUNGS:  Respirations are unlabored with diminished breath sounds at the  bases, otherwise clear to auscultation.  CARDIAC:  Irregularly regular.  S1 and S2.  No S3 or S4 murmurs.  ABDOMEN:  Round.  Soft, nontender, and nondistended.  Bowel sounds  present x4.  CHEST [name]/BACK:  She has tenderness on the right.  EXTREMITIES:  Warm, dry, and pink.  No clubbing, cyanosis, or edema.  Dorsalis pedis and posterior tibial pulses 2+ bilaterally.   She had a CT of the thoracic spine.  She has no acute findings.  CT of  the head shows no acute intracranial abnormality with a hematoma of the  right frontal bone without underlying fracture.  Extensive microvascular  ischemic changes.      [name], ANP      [name] Sans. Daleen Squibb, MD, [facility]  Electronically Signed    CB/MEDQ  D:  05/25/2008  T:  05/26/2008  Job:  [id number]

## 2010-09-09 NOTE — Discharge Summary (Signed)
[name], [name]                 ACCOUNT NO.:  [id number]   MEDICAL RECORD NO.:  [id number]          PATIENT TYPE:  INP   LOCATION:  4711                         FACILITY:  [facility]   PHYSICIAN:  Kela Millin, M.D.DATE OF BIRTH:  [birth year]   DATE OF ADMISSION:  12/07/2008  DATE OF DISCHARGE:                               DISCHARGE SUMMARY   ADDENDUM:  Dictated on December 07, 2008, by Dr. Ramiro Harvest.   Since December 07, 2008, the patient has continued with slow clinical  improvement.  She is currently stable at the time of discharge.  She  did, however, have period of hyperglycemia exacerbated by steroids.  Her  sugars are now in control.  Plan to discharge home on her home insulin  regimen with steroid taper.  She was seen in consultation by Dr. Fannie Knee during this admission.  He recommended the addition of Mucinex as  well as QVAR and Tussionex as needed.  Prescriptions will be provided  for these medications at the time of discharge.  She also had an episode  of constipation, which resolved after Fleet Enema.  She has completed  antibiotics and will be discontinued at the time of discharge.   MEDICATIONS AT THE TIME OF DISCHARGE:  1. Multivitamin 1 tablet p.o. daily.  2. Crestor 10 mg p.o. daily.  3. Lantus 46 units subcu daily.  4. Exelon 4.6 mg per 24-hour patch to be changed daily in the morning.  5. Coumadin 5 mg p.o. 5 days weekly and 7.5 mg 2 days weekly.  6. Lovaza 1 g 2 caps p.o. b.i.d.  7. Metoprolol ER 100 mg p.o. daily.  8. Benicar 5 mg p.o. daily.  9. Humalog 10 units subcu three times daily.  10.Synthroid 200 mcg p.o. daily.  11.Mucinex 1200 mg p.o. b.i.d.  12.Tussionex 5 mL 1 teaspoon twice daily as needed for severe cough.  13.QVAR 2 puffs twice daily.  She is to rinse mouth after use.  14.Klor-Con 10 mEq p.o. b.i.d., this is a new dosing regimen.  15.Lasix 20 mg p.o. daily.  16.DuoNeb every 6 hours as needed via nebulizer.  17.Prednisone 10-mg  tablet 5 tablets once daily for 2 days, then 4      tablets once daily for 2 days, then 3 tablets once daily for 2      days, then 2 tablets once daily for 2 days, then 1 tablet once      daily for 2 days, then 1 tablet once daily for 2 days, then 1      tablet once daily for 2 days and stop.  18.Protonix 40 mg p.o. daily.   DISPOSITION:  She will be discharged to home with home health PT, OT,  and RN.  She is to follow up with Dr. Lucianne Muss in 1 week and contact the  office for an appointment.  In addition, we have asked the home health  RN to check PT/INR on August 25. 2010, and then once weekly and call  these results to the [facility].   Greater than 30 minutes were spent  on discharge planning.      [name], NP      Kela Millin, M.D.  Electronically Signed    MO/MEDQ  D:  12/17/2008  T:  12/18/2008  Job:  [id number]   cc:   Shelby Dubin, PharmD, BCPS, CPP  [name], M.D.

## 2010-09-09 NOTE — Consult Note (Signed)
[name], [name]                 ACCOUNT NO.:  [id number]   MEDICAL RECORD NO.:  [id number]          PATIENT TYPE:  INP   LOCATION:  3702                         FACILITY:  [facility]   PHYSICIAN:  Nelda Bucks, MD DATE OF BIRTH:  10-May-1924   DATE OF CONSULTATION:  DATE OF DISCHARGE:                                 CONSULTATION   Nurse called me briefly today secondary to pain, patient reports right  side greater than the left, during inspiration peaking when she takes a  deep breath consistent with pleuritic chest pain.  Cardiologist was  consulted by the nurse prior to this phone conversation to our service  and cardiology did not feel any other cardiac intervention, EKG, or  troponins were indicated given the patient's clear pleuritic history.  The patient's blood pressure is 160/90 with a heart rate of 70 with  atrial fibrillation.  Saturations are room air 93% to 96%.  Patient is  in no labored breathing.  Respiratory rate of 12 according to the nurse.  Tylenol will be provided for this patient.  Temperature is 98.2 please  note as well as repeating a portable chest x-ray stat to evaluate lung  fields.  This pain to the patient is something she has had with chronic  in nature although it seems to be a little worse today.  Pending the  portable chest x-ray, we will make decisions from there regarding other  therapies if indicated.      Nelda Bucks, MD  Electronically Signed     DJF/MEDQ  D:  05/26/2007  T:  05/27/2007  Job:  [id number]   cc:   Medical Record  Chart  Clinton D. Maple Hudson, MD, FCCP, FACP

## 2010-09-09 NOTE — Op Note (Signed)
[name], MALES                 ACCOUNT NO.:  [id number]   MEDICAL RECORD NO.:  [id number]          PATIENT TYPE:  INP   LOCATION:  4738                         FACILITY:  [facility]   PHYSICIAN:  Valetta Fuller, M.D.  DATE OF BIRTH:  11-05-1924   DATE OF PROCEDURE:  03/08/2008  DATE OF DISCHARGE:                               OPERATIVE REPORT   PREOPERATIVE DIAGNOSIS:  Gross hematuria.   POSTOPERATIVE DIAGNOSIS:  Gross hematuria.   PROCEDURE PERFORMED:  Flexible cystoscopy at bedside.   SURGEON:  Valetta Fuller, MD   ANESTHESIA:  None.   INDICATIONS:  Ms. Zirkle is an 76 year old female.  She has a complex  history.  She recently had some painless gross hematuria that was  associated with anticoagulation as well as a history of von Willebrand  disease.  The hematuria is subsequently resolved.  Renal ultrasound was  unremarkable.  We felt flexible cystoscopy should be performed to rule  out significant bladder pathology that may have been unmasked by the  anticoagulation.  Full informed consent was obtained.   TECHNIQUE AND FINDINGS:  Flexible cystoscope was brought to the bedside.  The patient was prepped and draped in a usual manner.  Flexible  cystoscopy revealed unremarkable urethra and bladder.  He flushed the  urine from both sides, was clear.  I saw no evidence of any obvious  pathology.  No evidence of active bleeding, and felt this was a normal  cystoscopy.      Valetta Fuller, M.D.  Electronically Signed     DSG/MEDQ  D:  03/08/2008  T:  03/09/2008  Job:  [id number]

## 2010-09-09 NOTE — Assessment & Plan Note (Signed)
[facility]                            CARDIOLOGY OFFICE NOTE   NAME:[name]                        MRN:          [id number]  DATE:02/10/2008                            DOB:          02-27-1925    [name] is an 77 year old female who has previously been followed by  Dr. Gerri [name] and by Dr. Diona [name] most recently.  She now is  establishing with me.  She has a history of coronary artery disease  status post coronary artery bypassing graft.  This was performed in  2000.  At that time, the patient had a LIMA to the LAD, saphenous vein  graft to the first diagonal and sequential saphenous vein graft to the  first OM and second OM and a saphenous vein graft to the distal right  coronary artery.  The patient also has a history of permanent atrial  fibrillation.  The most recent echocardiogram was performed on April 11, 2007.  At that time, [name] ejection fraction was 50% and there was  inferior basal and septal hypokinesis.  There was mild mitral  regurgitation.  There was mild left atrial enlargement, mild right  ventricular enlargement, and moderate right atrial enlargement.  There  was moderate-to-severe tricuspid regurgitation.  [name] last Myoview was  performed in the hospital on April 29, 2007.  At that time, there was  no areas of inducible ischemia and [name] ejection fraction was 43%.  She  also had an abdominal ultrasound on March 21, 2007, that was  technically difficult but suggested probable normal aorta.  ABIs on  March 21, 2007, were normal as well.  In reviewing Dr. Ival [name]  notes, the patient has had some problems with compliance.  She has come  to the office multiple times having heart rates that have been elevated  but has not been taking [name] medications.  Also, note that she is not on  Coumadin for atrial fibrillation given [name] history of von Willebrand  disease.  Since she was last seen here, she does have mild dyspnea  on  exertion.  This has worsened recently and she has had a URI.  She has  not had exertional chest pain or pedal edema.  She has not had  palpitations or syncope.   MEDICATIONS AT PRESENT:  1. Fish oil.  2. Flaxseed oil.  3. Cinnamon.  4. Lutein.  5. Multivitamin.  6. Synthroid 225 mcg p.o. daily.  7. Doxazosin 4 mg p.o. daily.  8. Insulin.  9. Digoxin 0.25 mg p.o. daily.  10.Lopressor 12.5 mg p.o. b.i.d.  11.Lasix 20 mg p.o. daily.  12.Cardizem 180 mg p.o. b.i.d.  13.Niacin 250 mg p.o. b.i.d.  14.Calcium and vitamin D.   PHYSICAL EXAMINATION:  VITAL SIGNS:  Today shows a blood pressure of  120/80 and [name] pulse is 136.  She weighs 196 pounds.  She is well  developed and morbidly obese.  HEENT:  Normal.  NECK:  Supple.  CHEST:  Clear.  CARDIOVASCULAR:  Irregular rhythm and a tachycardic rate.  ABDOMINAL:  No tenderness.  EXTREMITIES:  No edema.  [name] electrocardiogram shows atrial fibrillation with a rapid ventricle  response at 136.  The axis is normal.  There are nonspecific ST changes.   DIAGNOSES:  1. Permanent atrial fibrillation - [name]' rate is again elevated.      I have reviewed Dr. Ival [name] notes and apparently this has been a      problem in the past and there has also been an issue with      compliance.  However, she states she has been taking all of [name]      medications.  I will increase [name] Cardizem to 240 mg p.o. b.i.d.      We will also plan to repeat [name] echocardiogram.  I will also place      a cardiac monitor.  Hopefully, we can control [name] rate with the      above interventions and she may need to have [name] Lopressor      increased as well.  If [name] heart rate control cannot be controlled,      then she will need consideration of either addition of amiodarone      or atrioventricular node ablation as I would be concerned about the      development of a tachycardia-mediated cardiomyopathy.  I will have      [name] return in 4 weeks and we will  review that.  2. Coronary artery disease status post coronary artery bypassing graft      - The patient has not had exertional chest pain and [name] previous      Myoview showed no ischemia.  We will continue with medical therapy      including [name] beta-blocker, Cardizem.  She is not on aspirin given      [name] history of von Willebrand.  She also has been intolerant to      statins in the past.  3. History of intolerance to STATINS.  4. Diabetes mellitus - She will continue on [name] insulin and [name]      primary care physician will take care of this.  5. Hypertension - [name] blood pressure is adequately controlled on the      present medications.  6. Von Willebrand disease.  7. History of hypothyroidism.   We will see [name] back in 4 weeks as described above.     [name] Jens Som, MD, [facility]  Electronically Signed    BSC/MedQ  DD: 02/10/2008  DT: 02/11/2008  Job #: [id number]   cc:   [name] Fears D. Maple Hudson, MD, FCCP, FACP  [name], M.D.

## 2010-09-09 NOTE — H&P (Signed)
[name], [name]                 ACCOUNT NO.:  [id number]   MEDICAL RECORD NO.:  [id number]          PATIENT TYPE:  INP   LOCATION:  2017                         FACILITY:  [facility]   PHYSICIAN:  Rollene Rotunda, MD, FACCDATE OF BIRTH:  1925/02/22   DATE OF ADMISSION:  04/22/2007  DATE OF DISCHARGE:                              HISTORY & PHYSICAL   PRIMARY CARDIOLOGIST:  Dr. Nona Dell.   PRIMARY CARE PHYSICIAN:  Dr. Lucianne Muss.   PULMONOLOGIST:  Dr. Jetty Duhamel.   PATIENT IDENTIFICATION:  An 77 year old Caucasian female with history of  CAD status post CABG x5 in 2000 as well as chronic asthmatic bronchitis  who presents with continued wheezing and dyspnea as well as mid scapular  discomfort response to nitrates.   PROBLEM LIST:  1. Unstable angina/CAD.      a.     Status post CABG x5 in 2000.      b.     Low-risk Myoview 2005.  2. COPD/chronic asthmatic bronchitis.  3. A fib.      a.     No Coumadin secondary to von Willebrand's disease.  4. Von Willebrand's disease.  5. Hypothyroidism.  6. Type 2 diabetes mellitus x18 years.  7. Remote tobacco abuse.  8. Hypertension.  9. Hyperlipidemia.   HISTORY OF PRESENT ILLNESS:  An 77 year old Caucasian female with a  history of CAD status post CABG x5 in 2000. She was recently discharged  from [facility] April 15, 2007 following admission on December 12 for  asthmatic bronchitis flare.  During the hospitalization she was treated  with nebulizers, inhaled and oral steroids. She also had a fib with RVR  at that time with improved rate control on increased dose diltiazem  (2:40 in the a.m., 180 in the p.m.).  Since discharge on December 19,  she has continued to have cough, wheezing and dyspnea with minimal  exertion.  She also noted some lower extremity edema last night for  which she took p.r.n. Lasix and this has subsequently improved.  Today  she noted mid scapular back and chest discomfort with burning and the  sensation  as though she needed to belch. She noted that this was similar  to her previous angina and so she took a sublingual nitroglycerin with  relief of discomfort in about 5 minutes. Her symptoms recurred shortly  thereafter and she took another sublingual nitroglycerin with relief.  She then had a coughing fit which she has been prone to doing over the  past couple of weeks and noted increased heart rate, shortness of breath  and palpitations.  She then called EMS.  Upon their arrival, her heart  rate was in the 130s and A fib with RVR.  She was treated with IV  diltiazem 20 mg with reduction of rate.  Currently she is feeling okay  although she continues to cough intermittently.  She denies any pain.   ALLERGIES:  LATEX, CEFACLOR, CARBAMAZEPINE, DICYCLOMINE, SULFA, BIAXIN,  AUGMENTIN and STATIN.   HOME MEDICATIONS:  1. Synthroid 200 mcg daily.  2. Ambien 10 mg q.h.s.  3.  Humalog 10 units in the a.m., 6 units at noon, 6 units at dinner.  4. Pulmicort Flexhaler 90 mcg b.i.d.  5. B12 injection monthly.  6. Calcium daily.  7. Lantus 42-45 units daily.  8. Niacin 500 mg q.h.s.  9. Xopenex 1.25 mg nebulizer t.i.d.  10.Prednisone 15 mg daily.  11.ER 240 mg in the a.m., 180 mg in the p.m.  12.Prilosec OTC daily.  13.Colace 200 mg daily.  14.Nitroglycerin p.r.n.  15.Mucinex 1200 mg b.i.d.  16.Pulmicort nebulizer 0.5 mg b.i.d.   FAMILY HISTORY:  Mother died at age 10 following history of DVTs and  cancer.  Father died at 45 of CAD and COPD.  She had two sisters, one  died of breast cancer and Alzheimer's, the other is alive with a history  of ovarian cancer.   REVIEW OF SYSTEMS:  Positive for chest and mid scapular discomfort and  burning, shortness of breath, dyspnea on exertion, chronic cough, lower  extremity edema last night now resolved.  Positive for tachy  palpitations.  She is diabetic.  Otherwise all systems reviewed and  negative.   PHYSICAL EXAM:  Temperature 97.3, heart rate  96, respirations 20, blood  pressure 120/63, pulse ox 100% on 2 liters.  Pleasant, white female in no acute distress.  Awake, alert and oriented  x3.  NECK:  No bruits or JVD.  LUNGS:  Respirations regular and unlabored.  No crackles.  She has  scattered rhonchi and inspiratory and expiratory wheezing posteriorly  and anteriorly throughout.  CARDIAC:  Irregularly irregular, tachycardiac, S1, S2.  She has a 2/6  systolic murmur at the left lower sternal border.  ABDOMEN:  Round, soft, nontender, nondistended. Bowel sounds present x4.  EXTREMITIES:  Warm, dry, pink.  No clubbing, cyanosis or edema.  Dorsalis pedis and posterior tibial pulses 2+ and equal bilaterally.   Chest x-ray shows no definite acute findings.  Cardiomegaly with  vascular congestion.  No definite focal air space disease.  EKG shows A  fib, normal axis, rate of 95 beats per minute, no acute ST-T changes.  Hemoglobin of 12.4, hematocrit 36.3, WBC 10.8, platelet 316.  Sodium  139, potassium 4.3, chloride 103, CO2 27, BUN 22, creatinine 0.77,  glucose 220, AST 17, ALT 30, total protein 6.0, albumin 3.4, BNP 86, MB  less than 1.0, troponin-I less than 0.5, dig less than  0.2, calcium  9.0.   ASSESSMENT/PLAN:  1. Unstable angina/coronary artery disease.  The patient has mid      scapular and substernal chest discomfort with burping sensation      relieved by nitrates.  All-in-all she feels that this is similar to      her previous angina.  That being said, she has had this      intermittently to some degree x many years with a low-risk Myoview      in 2005.  Admit and cycle cardiac enzymes.  With prior history of      von Willebrand disease along with other comorbidities, at this      point would prefer to avoid catheterization and instead will plan      an adenosine Myoview once respiratory status and wheezing is      improved. Would plan cath only if she exhibited high-risk features      on Myoview.  2. Chronic  obstructive pulmonary disease/asthmatic bronchitis. This      remains a problem and it is difficult to determine how it  contributes to her other symptoms (chest pain and rapid A fib).      Will continue her inhaler therapy and will switch her prednisone to      IV Solu-Medrol.  If her breathing is not improved in the morning      would plan to reconsult pulmonary.  3. Atrial fibrillation with rapid ventricular response. Heart rate      increases with coughing spells and pretty much any activity.  Rate      is likely driven by pulmonary status and dyspnea.  Plan to increase      her diltiazem to 120 q.6 h while she is in the hospital and could      consider digoxin.  No antiarrhythmic, or cardioversion as she is      ultimately not a Coumadin candidate because of her von Willebrand      disease.  No anticoagulation.  If catheterization was determined to be required  she would need to be seen by hematology.  1. Type 2 diabetes mellitus.  Continue home insulin and add resistant      sliding-scale insulin.  2. Hypothyroidism.  TSH was normal on her last admission.  3. Hypertension stable.  4. Hyperlipidemia.  She has been intolerant to statins in the past.      Her LDL on admission was 81 with a total cholesterol of 154.      [name], ANP      Rollene Rotunda, MD, [facility]  Electronically Signed    CB/MEDQ  D:  04/22/2007  T:  04/22/2007  Job:  [id number]

## 2010-09-09 NOTE — Consult Note (Signed)
NAMELUZELENA, HEEG                 ACCOUNT NO.:  [id number]   MEDICAL RECORD NO.:  [id number]          PATIENT TYPE:  INP   LOCATION:  3303                         FACILITY:  [facility]   PHYSICIAN:  Noralyn Pick. Eden Emms, MD, FACCDATE OF BIRTH:  1925-01-19   DATE OF CONSULTATION:  08/10/2008  DATE OF DISCHARGE:                                 CONSULTATION   We are asked to see the patient for management of atrial fibrillation  and Coumadin.   The patient is an 77 year old somewhat debilitated patient with history  of COPD.  She has had distant history of bypass surgery with ischemic  cardiomyopathy, at one point her EF was as low as 35%.  She received a  BiV AICD and AV nodal ablation.  She is ventricular lead paced.  She is  in chronic atrial fibrillation.  The patient's notes indicate a question  of a left atrial appendage thrombus, but I can find no record of a  transesophageal echo.  She has been on chronic Coumadin, which is  followed in our clinic.  She was admitted to the hospital with bright  red blood per rectum and an INR of 3.7.  She received vitamin K and her  INR is currently 2.1.   The patient indicates a history of anemia and she has had significant  constipation and on admission, noticed bright red blood in the toilet   She is currently being worked up by GI.   She has not had any significant chest pain.  She has mild chronic  exertional dyspnea.  There has been no lower extremity edema.  Her heart  failure has been compensated.   The patient has not had a bleeding problem before on Coumadin.   REVIEW OF SYSTEMS:  Otherwise, remarkable for some peripheral  neuropathy, chronic exertional dyspnea.  She currently is not having any  epigastric pain.  All other review of systems looked at and negative.   PAST MEDICAL HISTORY:  Remarkable for:  1. Anemia.  2. Chronic atrial fibrillation.  3. History of coronary artery disease with previous CABG.  4. COPD, status post BiV  AICD.  5. History of hypercholesterolemia.  6. Hypertension.  7. Hypothyroidism.   SOCIAL HISTORY:  The patient is married.  She lives with her husband.  She does not drink or smoke.  She is fairly sedentary.   FAMILY HISTORY:  Noncontributory.   ALLERGIES:  The patient is allergic to STATINS, AUGMENTIN, BIAXIN,  CARBAMAZEPINE, CEFACLOR, DICYCLOMINE, LABETALOL, ZOFRAN, and TEGRETOL.   MEDICATIONS ON ADMISSION:  1. Synthroid 200 mcg a day.  2. Metoprolol 100 a day.  3. Vitamin B12.  4. Lantus 46 units at night.  5. Coumadin.  6. Benicar 5 mg a day.  7. Lasix 40 a day.  8. Potassium 10 a day.  9. Crestor 10 a day.  10.Lovaza 1 g 4 times a day.  11.Humalog sliding scale.   PHYSICAL EXAMINATION:  GENERAL:  Remarkable for an elderly female with  premature gray hair.  VITAL SIGNS:  Stable.  She is in atrial fibrillation with ventricular  pacing at a rate of 88, blood pressure is 135/70, respiratory rate 14,  afebrile, and sats were 96% on room air.  HEENT:  Unremarkable.  NECK:  Carotids are normal without bruit.  No lymphadenopathy,  thyromegaly, or JVP elevation.  LUNGS:  Clear, good diaphragmatic motion.  No wheezing.  S1 and S2.  Normal heart sounds.  PMI normal.  AICD and BiV pacer on the left collar  bone in good position.  ABDOMEN:  Benign.  Bowel sounds positive.  No mass.  No AAA.  No  hepatosplenomegaly, hepatojugular reflux, or tenderness.  EXTREMITIES:  Distal pulses are intact.  No edema.  NEUROLOGIC:  Nonfocal.  SKIN:  Warm and dry.  MUSCULOSKELETAL:  No muscular weakness.   CURRENT LABORATORY WORK:  Remarkable for LDL of 36 and BNP is mildly  elevated at 568.  Troponin is negative.  BUN is elevated at 41 in the  setting with GI bleed and creatinine is 1.46.  Current INR is 2.1 after  vitamin K.  Chest x-ray shows no significant edema.  She is status post  sternotomy, changes significant for COPD.   EKGs reviewed shows atrial fibrillation with ventricular  pacing, no  acute changes.   IMPRESSION:  1. Chronic atrial fibrillation, question distant history of left      atrial appendage clot.  The patient does not appear to have had a      significant gastrointestinal bleed.  If her EGD and lower endoscopy      did not show any major pathology, I would restart her Coumadin and      shoot for a target INR of 2-2.5.  I would not use heparin or      Lovenox overlap.  Her rate control appears fine on current dose of      beta-blocker.  She has had an AV node modification at the time of      her BiV pacer.  2. Distant history of coronary artery disease with ischemic      cardiomyopathy, currently not having angina.  For the time being,      she will not be placed on aspirin, and her beta-blocker will be      continued.  She is not having chest pain.  3. History of chronic obstructive pulmonary disease, currently no      active wheezing.  Chest x-ray benign.  Continue to monitor O2 sats.  4. Gastrointestinal bleed, per Primary Service.  The patient appears      hemodynamically stable.  Systolic blood pressure is fine.      Hematocrit is stable at 35, but she is guaiac positive.   We will be happy to follow the patient along here in the hospital, but  currently she appears euvolemic with good rate control of her atrial  fibrillation and is not having any angina, and a decision will be made  about restarting of Coumadin based on GI pathology.      Noralyn Pick. Eden Emms, MD, [facility]  Electronically Signed     PCN/MEDQ  D:  08/10/2008  T:  08/11/2008  Job:  [id number]

## 2010-09-09 NOTE — Assessment & Plan Note (Signed)
[facility]                            CARDIOLOGY OFFICE NOTE   NAME:FORBESRenata, Gambino                        MRN:          [id number]  DATE:03/26/2008                            DOB:          Mar 09, 1925    [name] is an 77 year old female who has a history of coronary  artery disease status post coronary bypassing graft performed in 2000.  Her last Myoview was performed in the hospital on April 29, 2007, and  showed no ischemia with an ejection fraction of 43%.  She also has a  history of permanent atrial fibrillation.  There was also a question of  prior von Willebrand disease.  On February 24, 2008, the patient returned  and was again noted to be in atrial fibrillation with a rapid  ventricular response.  She was ultimately admitted to [facility] for treatment.  Note, there had been a concern by Dr. Diona Browner  who had followed her in the past that she was noncompliant with her  medications.  After resuming her medications and increasing the doses,  she became bradycardic.  We did decrease her medications and  discontinued her Cardizem and ultimately her heart rate was controlled.  This was felt to raise the issue that she may have not been taking all  of her medications at home.  She was discharged but returned after an  episode of near syncope.  At an outside hospital, she was noted to have  a thrombus in her left atrium.  She was therefore transferred back to  [facility] for consideration of anticoagulation.  There was a  concern about this given her history of possible of von Willebrand  disease.  She was seen by Hematology/Oncology, and a von Willebrand  panel was negative, and therefore heparin was initiated.  Note, she did  have transient hematuria on heparin which resolved after discontinuing  the medication.  She did have a workup for this and had a negative  cystoscopy.  An ultrasound showed a normal uterus.  The left  adnexa/ovary measured 3.9 x 2.8 x 3.9 cm and was felt to be possibly a  simple cyst.  However, it was felt that a epithelial cystic neoplasm  cannot be excluded and followup was recommended.  Also of note, the  patient did have a cardiac MRI on March 01, 2008, that did confirm a  large thrombus in the left atrial appendage.  Ejection fraction was 25-  30%.  A renal ultrasound was negative.  The patient did have an  echocardiogram performed on February 17, 2008, that showed an ejection  fraction of 35%.  There was mild mitral regurgitation.  The left atrium,  right ventricle, and right atrium were dilated, and there was moderate  tricuspid regurgitation.  The patient during the second admission had a  rate control and medications adjusted and ultimately was treated with  Toprol 100 mg p.o. daily and digoxin 0.125 mg p.o. daily.  During the  hospitalization, her heart rate was in the 70s to 80s.  Also of note, we  did resume  heparin and Coumadin and ultimately she tolerated this  without any further bleeding.  She was discharged.  Since that time, she  has multiple complaints.  She does have dyspnea on exertion which has  been a chronic issue.  There is no orthopnea or PND.  There is no pedal  edema.  She has not felt any palpitations.  She has not had chest pain.  She has apparently had some problems with her memory, and her daughter  wonders whether this maybe related to a recent stroke.  However, there  is no other focal neurological symptoms.  She does feel somewhat  fatigued at times.   Her medications include:  1. Coumadin as directed.  2. Synthroid 225 mcg p.o. daily.  3. Lasix 20 mg p.o. daily.  4. Niacin 5 mg p.o. daily nightly.  5. Benicar 5 mg p.o. daily.  6. Digoxin 0.125 mg p.o. daily.  7. Gabapentin 200 mg p.o. t.i.d.  8. Insulin.  9. Multivitamin.  10.B12.  11.Calcium.  12.Fish oil.  13.Toprol 100 mg p.o. daily.   Her physical exam today shows a blood pressure of  100/60 and her pulse  is 101.  Her HEENT is normal.  Neck is supple.  Chest is clear.  Cardiovascular reveals a tachycardic rate and irregular rhythm.  Abdominal exam shows no tenderness.  Extremities show no edema.  Note,  there are no gross focal findings on neurological examination.   Electrocardiogram shows atrial fibrillation at a rate of 101.  The axis  is normal.  Prior septal infarct cannot be excluded.  There are  nonspecific ST changes.   DIAGNOSES:  1. Permanent atrial fibrillation - Mrs. Storts remains in atrial      fibrillation today.  Her heart rate is mildly elevated, but in      talking with her and her daughter it is not clear that she is      taking digoxin.  In fact, they are unaware of her medications and      doses.  I have asked her to contact us when she returns home and we      will adjust her regimen as indicated.  Note, I think there has been      a problem with compliance as a major cause of her uncontrolled      heart rate.  I also think that tachycardia may potentially explain      some of her cardiomyopathy.  I have explained that it is imperative      that she take her medications.  Her heart rate was perfectly      controlled on Toprol 100 mg p.o. daily and digoxin 0.25 mg p.o.      daily when she was in the hospital on the monitor.  She will also      continue on Coumadin with a goal INR of 2-3.  2. Left atrial clot - this has been documented and there was concern      about beginning anticoagulation given this question of von      Willebrand disease.  However, her workup in the hospital by      Hematology was negative.  She has had no bleeding thus far.  We      will continue on Coumadin with goal INR of 2-3, and I would like to      continue this lifelong if she is agreeable.  We will check a CBC to      follow her  hemoglobin and hematocrit.  3. Coronary artery disease status post coronary bypassing graft - the      patient has not had exertional  chest pain and her previous Myoview      showed no ischemia.  She will continue with Coumadin, Toprol,      Benicar, and digoxin.  She is not on an aspirin due to the concern      about bleeding now that Coumadin is being added.  4. Question history of von Willebrand disease - her workup in the      hospital was negative.  5. Recent hematuria - her urological evaluation in the hospital was      also negative.  6. Hypertension - her blood pressure is adequately controlled on      present medications.  7. History of hypothyroidism.  8. Diabetes mellitus.  9. History of intolerance to STATINS.  10.Ovarian cyst - this will need to be followed up.  She has scheduled      herself to see a gynecologist as she has had mild vaginal bleeding      when she was in the hospital which has resolved as well.  I have      asked her to follow up with them concerning the question of ovarian      cyst as well.   I will see her back in approximately 4 weeks to make sure that she is  not actively bleeding.     [name] Jens Som, MD, [facility]  Electronically Signed    BSC/MedQ  DD: 03/26/2008  DT: 03/27/2008  Job #: [id number]

## 2010-09-09 NOTE — H&P (Signed)
[name], [name]                 ACCOUNT NO.:  [id number]   MEDICAL RECORD NO.:  [id number]          PATIENT TYPE:  INP   LOCATION:  2003                         FACILITY:  [facility]   PHYSICIAN:  Jesse Sans. Wall, MD, FACCDATE OF BIRTH:  1925/04/17   DATE OF ADMISSION:  05/25/2008  DATE OF DISCHARGE:                              HISTORY & PHYSICAL   ADDENDUM   CT of the thoracic spine performed at [facility] showed no acute  findings.  CT of the head showed no acute intracranial abnormality,  hematoma over the right frontal bone without underlying fracture.  Extensive microvascular skin changes.  EKG shows AFib, PVC rate of 84,  IVCD normal axis.   LABORATORY WORK:  Hemoglobin 15.6, hematocrit 45.3, WBC 6.5, platelets  200.  Sodium 137, potassium 4.6, chloride 94, CO2 31, BUN 25, creatinine  0.96, glucose 251, total bilirubin 0.6, alkaline phosphatase 123, AST  24, ALT 24, albumin 3.6, INR 2.3, calcium 9.6.  Digoxin 0.77.   ASSESSMENT AND PLAN:  1. Weakness/fall.  The patient with a 2+ month history of nausea and      weakness perhaps worse over the past week or so.  She saw her      primary care [name] on May 24, 2008 and told that she was      orthostatic and also to increase the sodium intake.  She awoke last      night and fell disoriented and subsequently fell striking her head      in the right side.  She denied any loss of consciousness.  Head CT      and CT of the thoracic spine were negative at [facility].      We will plan to hydrate and check orthostatics here.  Followup      chest x-ray rule out fracture.  We will hold on her Lasix as we are      hydrating her and have to watch her volume status closely with a      history of congestive heart failure.  2. Coronary artery disease and chest pain.  Continue beta blocker,      statin.  Check enzymes in light of fall question syncope.  3. Orthostatic hypotension per the patient.  Check orthostatics here.      See weakness/fall.  4. Nausea.  The patient apparently was prescribed Phenergan a week ago      and this caused significant disorientation.  Per her report, she      has stopped taking this.  5. Diabetes mellitus.  Continue Lantus sliding scale insulin then will      follow.  6. Ischemic cardiomyopathy/chronic systolic heart failure.  She is      euvolemic.  Follow up to watch volume status closely in the setting      of hydration.  We are holding Lasix.  7. Chronic atrial fibrillation.  She remains on Coumadin and is rate      controlled with beta-blocker therapy.  8. History of hypertension as above.  We will check orthostatics.  She  may need some adjustment if she is markedly orthostatic and not      responsive to hydration alone.  9. History of dysuria.  Check a urinalysis and culture.  10.Hypothyroidism.  Check TSH.  Continue Synthroid.  This is followed      by Dr. Lucianne Muss as an outpatient.  11.History of pernicious anemia, hemoglobin and hematocrit 15.6 and      45.3 respectively.      [name], ANP      Jesse Sans. Daleen Squibb, MD, [facility]  Electronically Signed    CB/MEDQ  D:  05/25/2008  T:  05/25/2008  Job:  [id number]

## 2010-09-09 NOTE — Assessment & Plan Note (Signed)
[facility]                            CARDIOLOGY OFFICE NOTE   NAME:[name], Podolski                        MRN:          [id number]  DATE:05/09/2008                            DOB:          1924-07-04    REASON FOR PRESENTATION:  Evaluate the patient with nausea and weakness.   HISTORY OF PRESENT ILLNESS:  The patient is an unfortunate 77 year old  white female who was hospitalized for couple of weeks in December and  then for a week this month.  She was discharged on May 03, 2008.  She  was hospitalized in December for management of atrial fibrillation.  She  also had some altered mental status.  In January, she had hyperglycemia,  but was only kept 2 days.  She said when she left the hospital, she was  not feeling well.  She has been weak since that time.  She feels very  fatigued.  She has had some chronic shortness of breath.  She feels her  heart raising, particularly when she is walking.  She had numbness in  her feet.  She had a chest fullness that has been there since she was in  the hospital and since discharge.  This was vague.  It happens at rest.  She just overall does not feel well.  Because of this, her daughter  called to have her seen today.  She can not describe anything that is  acutely changed though she is nauseated today, which has been unusual.  She is not describing any new chest pressure or neck discomfort.  She  has not had any presyncope or syncope though she has felt weak.  She has  not had any fevers or chills or cough.  She has had no diarrhea or  constipation.  She has been eating.  Her sugars have been elevated, but  this is not usual and she manages it at home.  She has not seen Dr.  Lucianne Muss in followup since she was discharged.  Her complaint seemed to be  similar to her complaints on admission in May 01, 2008, except now  her sugar is better controlled.   PAST MEDICAL HISTORY:  History of ischemic cardiomyopathy  with EF of  35%, permanent atrial fibrillation, history of atrial appendage  on  chronic Coumadin, anemia, coronary artery disease (CABG in 2000,  details described below), diabetes, hypertension, hyperlipidemia, Von  Willebrand disease, medical noncompliance, and hypothyroidism.   PAST SURGICAL HISTORY:  CABG (2000.  LIMA to the LAD, sequential SVG to  OM1 and OM2, saphenous vein graft to the right coronary artery) and  hernia repair.   ALLERGIES/INTOLERANCES:  SULFA, AUGMENTIN, BIAXIN, and CEPHALEXIN.   MEDICATIONS:  1. Coumadin.  2. Synthroid 250 mcg daily.  3. Crestor 10 mg daily.  4. Digoxin 0.125 mg daily.  5. Metoprolol 100 mg daily.  6. K-Dur 10 mEq daily.  7. Benicar 5 mg daily.  8. Omega 3.  9. Vitamin B12.  10.Vitamin D.  11.Lantus.  12.Sliding scale insulin.  13.Nebulizer.   REVIEW OF SYSTEMS:  As stated in the HPI  and otherwise negative for  other systems.   PHYSICAL EXAMINATION:  GENERAL:  The patient appears chronically ill,  but not in acute distress.  VITAL SIGNS:  Blood pressure 131/82, heart rate 64 and regular, and  weight 199 pounds.  HEENT:  Eyelids unremarkable.  Pupils equal, round, and reactive to  light.  Fundi not visualized.  Oral mucosa unremarkable.  NECK:  No jugular venous distention at 45 degrees.  Carotid upstroke  brisk and symmetrical.  No bruits.  No thyromegaly.  LYMPHATICS:  No adenopathy.  LUNGS:  Decreased breath sounds, but no wheezing or crackles.  BACK:  No costovertebral angle tenderness.  CHEST:  Unremarkable.  HEART:  PMI not displaced or sustained, S1 and S2 within normal limits.  No S3.  No murmurs.  ABDOMEN:  Obese.  Positive bowel sounds, normal in frequency and pitch.  No bruits, no rebound, no guarding, no midline pulsatile mass, no  hepatomegaly, and no splenomegaly.  SKIN:  No rashes, no nodules.  EXTREMITIES:  2+ pulse throughout, no edema, no cyanosis, and no  clubbing.  NEUROLOGIC:  Grossly intact.    DIAGNOSTIC DATA:  EKG, atrial fibrillation, rate 100, axis within normal  limits, intervals within normal limits, probable aberrantly conducted  beats with wider complex, T-wave changes, unchanged from previous EKGs.   ASSESSMENT AND PLAN:  1. Weakness and fatigue.  These complaints sound similar to the      complaint she has had in multiple visits in the fall and winter of      this year.  There is nothing acute on physical exam.  She remains      in atrial fibrillation.  She is hemodynamically stable.  I did      offer to send her to the emergency room for evaluation by the      hospitalist and possible admission.  However, she declined this,      but states she would go if she get worse.  I will draw some labs to      include her CMET and CBC to see if there are any acute findings      there.  I questioned her carefully about her blood sugar and she      states this is well controlled comparatively.  She can follow up      with the primary care doctor as well for management of this.  2. Atrial fibrillation.  She has her Coumadin followed.  She is in      permanent rhythm and it is not a new finding.  I do not think it is      contributing.  3. Nausea and vomiting.  I did give her a limited prescription for      Zofran.  However, if after a day or so she is still having this,      she needs to present to Dr. Lucianne Muss or to the emergency room, and she      promises she will do this.  4. Coronary artery disease.  There is no active evidence of ischemia.      No further cardiovascular testing is planned.  She will continue      with the risk reduction.  5. Followup.  We will schedule her to come back and see Dr. Jens Som      on a couple of weeks.     Rollene Rotunda, MD, [facility]  Electronically Signed    JH/MedQ  DD: 05/09/2008  DT: 05/10/2008  Job #:  [id number] 

## 2010-09-09 NOTE — H&P (Signed)
[name], [name]                 ACCOUNT NO.:  [id number]   MEDICAL RECORD NO.:  [id number]          PATIENT TYPE:  INP   LOCATION:  4729                         FACILITY:  [facility]   PHYSICIAN:  Brayton El, MD    DATE OF BIRTH:  06/13/1924   DATE OF ADMISSION:  02/29/2008  DATE OF DISCHARGE:                              HISTORY & PHYSICAL   INTERIM NOTE:   HISTORY OF THE PRESENT ILLNESS:  The patient is an 77 year old white  female with a past medical history significant for coronary disease  status post CABG with resultant ischemic cardiomyopathy and an ejection  fraction of 35%, chronic a-fib, von Willebrandt factor  deficiency/disease, diabetes, and hypothyroidism who was discharged from  [facility] yesterday after alteration of her atrial  fibrillation rate control medications.  The patient states that she did  not get her new prescriptions filled this morning, but took the  medicines that she already had at home.  She did well throughout the  day; however, this evening she began to have nausea, palpitations and  shortness of breath.  She reported to [facility].  At a [facility]  she was found to be in a-fib with rapid ventricular response, which they  were able to rate control.  She had a CT angiogram of the chest to rule  out pulmonary embolism.  This study was negative for pulmonary embolism,  but it did show a filling defect in the left atrial appendage that was  consistent with a thrombus.   REVIEW OF SYSTEMS:  On review of systems the patient denies any recent  bleeding.  She also denies any numbness or sensation changes in her  extremities.  She also denies any recent headaches.  The patient does  endorse some chronic mild numbness in both her hands that is likely  secondary to diabetes.   PHYSICAL EXAMINATION:  VITAL SIGNS:  On physical exam temperature is  97.4, pulse is 108, respiratory rate is 20, blood pressure is 123/79,  and satting 100% on  room air.  GENERAL APPEARANCE:  In general she is in no acute distress.  HEART:  The patient's heart is tachycardiac, irregular, without murmur,  rub or gallop.  LUNGS:  The lungs are clear to auscultation bilaterally.  ABDOMEN:  The abdomen is soft, nontender and nondistended.  EXTREMITIES:  The extremities are without edema.  SKIN:  The skin is warm and dry.  NEUROLOGIC:  The neuro exam is nonfocal.   LABORATORY DATA:  Labs from [place]:  sodium 138, potassium 4.7,  chloride 100, CO2 31, BUN 20, creatinine 1.3, and glucose 201.  White  count 9,  hemoglobin 14, hematocrit 42 and platelet count 280,000.  INR  1.1.  PTT 44.5.   ASSESSMENT:  1. Atrial fibrillation.  2. Filling defect in the left atrial appendage consistent with      possible thrombus.  3. Ischemic cardiomyopathy.  4. von Willebrandt factor/disease.  5. Diabetes.   PLAN:  1. The patient will be admitted to a telemetry unit.  2. We will continue the medications that she  was discharged on      yesterday; and, in addition, we will place her on a sliding scale      insulin.  At [facility] a CT scan was officially read as a      left atrial appendage thrombus, but I would consider a secondary      review of the images to assure that the patient does not have a      pseudothrombus, which can sometimes be mistaken for a true thrombus      in the left atrial appendage.  Unfortunately transesophageal      echocardiogram is not a good option for this patient secondary to      mucous membrane bleeding in patients that have von Willebrandt      factor deficiency.  3. We could consider a cardiac MRI to confirm the presence of a left      atrial appendage thrombus.  4. In the interim we will start the patient on heparin without a bolus      to attempt to maintain PTT between 50 and 60.  5. We will consider a hematology consult tomorrow.  6. Lastly the patient will be ruled out for a myocardial infarction      although  this is an unlikely diagnosis with her presentation.      Brayton El, MD  Electronically Signed     SGA/MEDQ  D:  02/29/2008  T:  03/01/2008  Job:  [id number]

## 2010-09-09 NOTE — Consult Note (Signed)
[name], [name]                 ACCOUNT NO.:  [id number]   MEDICAL RECORD NO.:  [id number]          PATIENT TYPE:  INP   LOCATION:  3703                         FACILITY:  [facility]   PHYSICIAN:  Deanna Artis. Hickling, M.D.DATE OF BIRTH:  [birth year]   DATE OF CONSULTATION:  04/03/2008  DATE OF DISCHARGE:                                 CONSULTATION   CHIEF COMPLAINT:  Memory difficulty.   HISTORY OF THE PRESENT CONDITION:  The patient is an 77 year old woman  with atrial fibrillation, hypertension, coronary artery disease, von  Willebrand factor deficiency, status post coronary artery bypass graft  in 2000.   The patient was admitted to [facility] recently for atrial fibrillation  with rapid ventricular response.  Cardizem discontinued.  I am not  certain what took its place.  The patient was readmitted with a thrombus  in her left atrium.  She is on heparin and is crossing over to Coumadin.  She is subtherapeutic at this time.  MRI scan of the heart on March 01, 2008 showed a large thrombus in the left atrial appendage ejection  fraction 25-30%.  The patient was discharged home on heparin and  Coumadin.  April 03, 2008 the patient was admitted for chest pain.  She stated that she was having problems forgetting things at home.  This  coincided with beginning Neurontin (gabapentin) for painful diabetic  peripheral neuropathy.  She says that nothing has helped.  The  patient's daughter states that the beginning of her difficulties  coincided with starting the medication.  The patient has not had  problems with her activities of daily living naming objects, baking,  working in a checkbook or carrying out other activities of daily living.  Daughter is so convinced and confirmed of these historical findings.   Risk factors are stroke, hypertension, diabetes mellitus, atrial  fibrillation, clot in the heart, hypothyroidism, and coronary artery  disease.   PAST SURGICAL HISTORY:   Status post coronary artery bypass graft.  The  patient has hypokinesis and akinesis of the inferior and septal walls.   CURRENT MEDICATIONS:  1. Aspirin 325 mg daily.  2. Lovenox 40 mg subcutaneously.  3. Lasix 40 mg daily.  4. Coumadin 7.5 mg daily at 1800 hours.  5. Crestor 10 mg at 1800 hours.  6. Metoprolol 100 mg daily.  7. Benicar 5 mg daily.  8. Insulin 1-9 units sliding scale three times daily.   ALLERGIES:  ATORVASTATIN, VALDECOXIB, LABETALOL, DICYCLOMINE, TEGRETOL,  CEPHALOSPORIN, SULFA, TRILEPTAL, and AUGMENTIN.   FAMILY HISTORY:  Positive for coronary artery disease and hypertension  in her father, cerebrovascular accident in her mother, sister with  dementia.   SOCIAL HISTORY:  The patient does not use tobacco, alcohol or drugs.   Twelve system review is remarkable for shortness of breath, dyspnea on  exertion, hypothyroidism, leg edema.  In addition to those factors noted  above in the history of present illness, 12 system review otherwise  negative.   PHYSICAL EXAMINATION:  VITAL SIGNS:  Blood pressure 115/63, resting  pulse 99, respirations 18, temperature 98.3.  GENERAL:  Unremarkable.  LUNGS:  Clear.  HEART:  No murmurs.  ABDOMEN:  Soft.  No signs of infection.  Bowel sounds normal.  No  hepatosplenomegaly.  NECK:  Supple.  No cranial or cervical bruits.  NEUROLOGIC:  Awake, alert, attentive, appropriate, no dysphasia,  dyspraxia, names objects, follows commands.  Conveys thoughts and  feelings.  The patient has a mini-mental status of 28.  She had  difficulty with serial sevens, difficulty recalling three objects and  some difficulty with drawing intersecting pentagons.  Cranial nerves:  Visual fields full.  Extraocular movements full.  Symmetric facial  strength.  Midline tongue.  Fundi normal.  Air conduction greater than  bone conduction bilaterally.   Motor examination; normal strength, tone and mass.  Good fine motor  movements.  No pronator  drift.  Sensation intact to cold vibration,  stereognosis.  The patient has a mild peripheral neuropathy.  Gait:  Slightly broad-based, choppy.  She looks at her feet.  I believe this is  related to her peripheral neuropathy.  She says that there is nothing  new about it.   IMPRESSION:  An 77 year old woman with a 2-week history of decreased  memory coinciding with the use of Neurontin. (780.93)   RECOMMENDATIONS:  Discontinue Neurontin and observe over the next week.  I would be happy to carry out further neurodiagnostic workup if her  mental status remains unchanged.   Laboratory studies to date PT is 14.1, INR 1.1, PTT 40, AST 40, ALT 54,  lipase 41.  Sodium 143, potassium 3.7, chloride 101, CO2 34, BUN 13,  creatinine 1.13, GFR 56, glucose 156, white blood cell count 9900,  hemoglobin 13.1, hematocrit 39.3, platelet count 198,000, digoxin  nondetectable, BNP elevated at 458, triglyceride 110, cholesterol 166,  HDL 42, LDL slightly elevated at 102.   CT scan of the brain which I have reviewed shows chronic ischemic  changes, mild atrophy and no other change.  I will be happy to see the  patient in followup should her symptoms not clear.  I have discussed the  case with Dr. Jens Som and informed him with my findings.      Deanna Artis. Sharene Skeans, M.D.  Electronically Signed     [facility]/MEDQ  D:  04/03/2008  T:  04/04/2008  Job:  [id number]

## 2010-09-09 NOTE — H&P (Signed)
[facility] ADMISSION   NAME:[name], [name]                        MRN:          [id number]  DATE:06/13/2008                            DOB:          [birth year]    PRIMARY CARDIOLOGIST:  Dr. Jens Som.   PRIMARY CARE:  Dr. Lucianne Muss.   PULMONARY:  Dr. Jetty Duhamel.   CHIEF COMPLAINT:  Shortness of breath.   HISTORY OF PRESENT ILLNESS:  This is an 77 year old white female patient  who was just discharged home from the hospital on May 31, 2008,  after an admission for implantation of a single-chamber pacemaker, [employer]  [place], and AV nodal ablation.  It turned out her LV lead had dislodged  and she is pacing in VVI mode.   The patient was in the office today for a wound check and stated that  she became acutely short of breath last evening and woke up.  She was up  all night with breathing problems and leg swelling.  She also has  significant right back pain up into her right shoulder which has been  going on for at least 3 weeks.  She said she has talked to several  physicians about it and was actually in the emergency room and told she  had no cracked ribs.  She has had CTs in the past 3 months of her chest  that have apparently been negative.  She did eat 2 Vienna sausage  yesterday and is very liberal with the salt shaker.  She is also  wheezing.  She says she is on an inhaler prescribed by Dr. Shelle Iron and  also has a nebulizer at home but forgets to use it and has not used it  in a long time.   ALLERGIES:  SULFA, AUGMENTIN, BIAXIN, CEPHALEXIN and DICYCLOMINE.   CURRENT MEDICATIONS:  1. Warfarin  5 mg as directed.  2. Synthroid 225 mcg daily.  3. Furosemide 20 mg daily.  4. Niacin 500 mg q.h.s.  5. Benicar 10 mg 1/2 daily.  6. Digoxin 0.125 mg daily.  7. Lantus insulin as directed.  8. Multivitamin daily.  9. B12 injection 2 monthly.  10.Calcium plus D 600 daily.  11.Fish oil daily.  12.Toprol XL 50 mg  daily.  13.Crestor 10 mg daily.  14.Potassium 10 mEq daily.  15.Sliding scale insulin.  16.Lovaza 1000 mg q.i.d.   PAST MEDICAL HISTORY:  1. Significant for COPD.  2. She has had this right back and shoulder pain ongoing for awhile      now.  3. She has history of coronary artery disease. status post CABG in      2000 with a LIMA to the LAD, SVG to the OM, OM1 and OM-2, and SVG      to the RCA.  4. She has had overtreated hypothyroidism, and Synthroid has recently      been adjusted.  5. Hypertension.  6. Hyperlipidemia.  7. There is a question of von Willebrand disease, which has been      looked into especially with her being on Coumadin.  8. She has a history of chronic atrial fibrillation and was found to      have a left atrial clot in November 2009, for which she is on      Coumadin.  9. She has a history of pulmonary hypertension.  10.She has had multiple falls.  11.She has a history of diabetes mellitus.   SOCIAL HISTORY:  She lives with her daughter.  She does not smoke.   FAMILY HISTORY:  Please see recent admission history and physical.   REVIEW OF SYSTEMS:  Negative for dizziness or presyncopal signs or  symptoms, dyspepsia, dysphagia, nausea, vomiting, change in bowels or  melena.  CARDIOPULMONARY:  Please see HPI.  She denies any chest pain,  palpitations.  She does have the shortness of breath and dyspnea on  exertion.   PHYSICAL EXAMINATION:  This is a pleasant, elderly 77 year old white  female who is wheezing and complaining of shortness of breath.  Blood pressure 130/70, pulse 88, weight 200.  HEENT:  Head is normocephalic without sign of trauma.  Extraocular eye  movements are intact.  Pupils are equal and reactive to light and  accommodation.  Nasal mucosa is moist.  Throat is without erythema or  exudate.  NECK:  Without JVD, HJR, bruit or thyroid enlargement.  LUNGS:  Decreased breath sounds with few crackles at the left base,  inspiratory and  expiratory wheezing in the upper lung fields.  HEART: Regular rate and rhythm at 88 beats per minute, normal S1 and S2.  Distant heart sounds.  No significant murmur, rub, bruit, thrill or  heave noted.  ABDOMEN:  Soft without organomegaly, masses, lesions or abnormal  tenderness.  EXTREMITIES:  Edema +1 bilaterally.  She does have some dependent  cyanosis.  She has decreased distal pulses.  NEUROLOGIC:  Exam is without focal deficit.   IMPRESSION:  1. Acute diastolic heart failure, ejection fraction 55% on echo on      May 30, 2008, but was as low as 35% in October 2009.  2. Chronic obstructive pulmonary disease, wheezing.  We will ask Dr.      Fannie Knee to see in the hospital.  3. Right back pain and shoulder pain.  4. Status post single-chamber pacer on May 29, 2008, with the left      ventricular lead dislodged.  She is currently pacing VVI.  5. Atrioventricular nodal ablation on May 30, 2008, by Dr. Johney Frame.  6. Coronary artery disease, status post coronary artery bypass      grafting in 2000 with a left internal mammary artery to the left      anterior descending artery, saphenous vein graft to the obtuse      marginal, obtuse marginal 1 and obtuse marginal 2, and saphenous      vein graft to the right coronary artery.  7. Overtreated hypothyroidism, Synthroid is being adjusted.  8. Question of von Willebrand disease.  9. Chronic atrial fibrillation.  10.Left atrial clot found in November 2009.  11.Coumadin therapy, watching closely with possible one von Willebrand      disease and recent falls.  12.Recurrent falls.  13.Diabetes mellitus, insulin-dependent.   PLAN:  We are going to admit this patient to the hospital for IV  diuresis.  We will ask Dr. Maple Hudson to see her as well for her COPD and  wheezing.  She is having significant back pain, and we will see if she  has an effusion on x-ray and based on what her x-ray  looks, she may need  further workup.       Jacolyn Reedy, PA-C  Electronically Signed      Jesse Sans. Daleen Squibb, MD, [facility]  Electronically Signed   ML/MedQ  DD: 06/13/2008  DT: 06/13/2008  Job #: [id number]

## 2010-09-09 NOTE — Assessment & Plan Note (Signed)
[facility]                            CARDIOLOGY OFFICE NOTE   NAME:FORBESIvan, Maskell                        MRN:          [id number]  DATE:04/04/2007                            DOB:          Oct 07, 1924    PRIMARY CARE PHYSICIAN:  [name], M.D.   REASON FOR VISIT:  Cardiac followup.   HISTORY OF PRESENT ILLNESS:  [name] was seen back in June.  She  called the office recently stating that her heart beats were off and  that she had been feeling more fatigued and breathless over the last few  months.  She is not having any anginal chest pain and has had no falls  or syncope.  She has a known history of paroxysmal atrial fibrillation  and is not on Coumadin given a concurrent history of Von Willebrand's  disease.  At the present time she is not on any AV nodal blocking drugs  and her electrocardiogram does show atrial fibrillation, up to 138 beats  per minute, with nonspecific ST-T wave changes.  This does slow somewhat  but not below 100 after she has been seated for several minutes.  I  talked to her today about adjusting her medications and asked that she  continue on her low dose aspirin.   In the interim, [name] has undergone some lower extremity studies  including venous Doppler's, excluding deep venous thrombosis or venous  insufficiency.  She had arterial studies demonstrating normal ABI's with  no evidence of lower extremity occlusive arterial disease. She has been  having some leg pain which seems to be possibly related to neuropathy.   ALLERGIES:  BIAXIN, CEPHALEXIN, CARBAMAZEPINE, DICYCLOMINE, SULFA DRUGS,  AUGMENTIN.   CURRENT MEDICATIONS:  1. Micardis 80 mg p.o. daily.  2. Doxazosin 4 mg p.o. daily.  3. Lantus 40 units daily.  4. Humalog 10 units as directed.  5. B12 every two weeks.  6. Ambien 10 mg p.o. q.h.s.  7. Enteric coated aspirin 81 mg p.o. daily.  8. Omega 3 supplements, 1,000 mg two tablets daily.  9. Flax seed  oil.  10.Cranberry and cinnamon supplements.  11.Multivitamin daily.  12.Niacin.  13.Cymbalta 30 mg p.o. b.i.d.  14.Synthroid 25 mcg daily.   REVIEW OF SYSTEMS:  As described in history of present illness.   PHYSICAL EXAMINATION:  VITAL SIGNS: Blood pressure today is elevated at  151/100.  Heart rate 120's at rest, irregular in atrial fibrillation.  GENERAL APPEARANCE:  Patient looks comfortable and in no acute distress.  She is wearing sun glasses.  NECK:  Exam of the neck reveals no elevated jugular venous pressure.  No  loud bruits.  No thyromegaly or thyroid tenderness.  LUNGS:  Diminished breath sounds.  No wheezing today.  CARDIAC:  Exam reveals an irregularly, irregular rhythm.  A soft  systolic murmur.  No S3 gallop or pericardial rub.  ABDOMEN:  Nontender with normal bowel sounds.  EXTREMITIES:  Exhibit no significant pitting edema.   IMPRESSION AND RECOMMENDATIONS:  1. Recurrent atrial fibrillation with history of paroxysmal atrial      fibrillation.  My plan is to add Cardizem CD 180 mg daily and ask      her to decrease Micardis to 40 mg daily.  I suspect she will need      an AV nodal blocking drug for rate control and do not have a plan      at this time for active cardioversion, given the fact that we      cannot use Coumadin long term with her Von Willebrand's disease.      She will continue on a low dose aspirin instead.  I am  hopeful      that symptomatically she will feel better simply with rate control      alone.  I will have her come back in for a heart rate and blood      pressure check with nursing over the next week and we can adjust      her medications from there.  2. Known coronary artery disease, status post previous coronary artery      bypass grafting with normal ejection fraction.  The patient is      stable symptomatically without angina.     Jonelle Sidle, MD  Electronically Signed    SGM/MedQ  DD: 04/04/2007  DT: 04/04/2007  Job #:  [id number]   cc:   [name], M.D.  Clinton D. Maple Hudson, MD, FCCP, FACP

## 2010-09-09 NOTE — Discharge Summary (Signed)
[name], [name] NO.:  [id number]   MEDICAL RECORD NO.:  [id number]          PATIENT TYPE:  INP   LOCATION:  4711                         FACILITY:  [facility]   PHYSICIAN:  [name] Harvest, MD    DATE OF BIRTH:  December 04, 1924   DATE OF ADMISSION:  12/07/2008  DATE OF DISCHARGE:                               DISCHARGE SUMMARY   This is an interim discharge summary covering the periods between December 07, 2008 through December 11, 2008.  The patient's primary care physician  is Dr. Reather [name] of Endocrinology.   DISCHARGE DIAGNOSIS:  1. Chronic obstructive pulmonary disease exacerbation.  2. Type 2 diabetes.  3. Atrial fibrillation with rapid ventricular response.  4. History of ischemic cardiomyopathy.  5. Acute renal insufficiency.  6. History of severe esophageal dysmotility.  7. History of bright red blood per rectum with negative colonoscopy      April 2010 with small hemorrhoids.  8. History of paroxysmal atrial fibrillation status post AV nodal      ablation and placement of biventricular pacemaker February 2010.  9. History of ischemic cardiomyopathy, EF of 55% per echo of 2010.  10.History of coronary artery disease status post coronary artery      bypass graft x 4 in 2000.  11.History of left atrial appendage thrombus.  12.Hypertension.  13.Hyperlipidemia.  14.History of von Willebrand's disease.  15.History of pulmonary hypertension.  16.History of multiple falls.  17.History of pernicious anemia.  18.History of mild dementia.  19.Hypothyroidism.   DISCHARGE MEDICATIONS:  1. Multivitamin p.o. daily.  2. Klor-Con 10 mEq p.o. q.i.d.  3. Crestor 10 mg p.o. daily.  4. Lantus 46 units subcutaneous daily.  5. Exelon 4.6 mg per 24-hour patch daily.  6. Lovaza 1 gram 2 capsules p.o. b.i.d.  7. Metoprolol 100 mg p.o. daily.  8. Benicar 5 mg p.o. daily.  9. Humalog 10 units subcutaneous  t.i.d.  10.Synthroid 200 mcg p.o. daily.  11.Lasix 20 mg p.o.  daily.  12.Advair discus 100/50 one inhalation b.i.d.  13.The patient's Coumadin dose as well as other antibiotics and      further steroid medications will be dictated per discharging      physician.   DISPOSITION AND FOLLOWUP:  Will be dictated per discharging physician.  Consultations done none up to this date of December 11, 2008, however, the  patient has personally requested to be seen per her pulmonologist and  her cardiologist.   PROCEDURES PERFORMED:  1. A head CT without contrast was done on December 09, 2008 that showed      atrophy and chronic ischemic white matter disease.  No acute      intracranial abnormality or change from her prior exam.  Chest x-      ray was done December 07, 2008 that showed pulmonary vascular      congestion with a small right pleural effusion.  This likely      reflects mild fluid overload.   BRIEF ADMISSION HISTORY AND PHYSICAL:  [name] is an 77 year old  female with a history of COPD and  CHF, history of A-Fib and a left  atrial appendage thrombus who had presented to the ED with 1-day history  of wheezing and worsening shortness of breath.  The patient noted that  she was unable to sleep and developed increasing wheezing with a mild  productive cough the night prior to admission.  The patient described a  productive cough with frothy greenish sputum and also complained of some  chest tightness.  The patient denied any fevers, no chills, no nausea or  vomiting, no abdominal pain, no diarrhea or constipation.  No  generalized neurological symptoms.  No headaches, no dysuria.  No other  focal neurological symptoms.  No other associated symptoms.  The patient  had presented to the ED for further evaluation and management and we are  asked to admit the patient.   PHYSICAL EXAMINATION:  On admission temperature of 98.0, blood pressure  114/58, pulse of 84, respiratory rate 23, saturating 95% on room air.  GENERAL:  The patient awake, alert,  elderly, obese white female in no  acute distress.  HEENT:  Normocephalic, atraumatic.  Pupils equal, round and reactive to  light and accommodation.  Extraocular movement intact.  Oropharynx was  clear.  No lesions.  No exudates.  NECK:  Supple.  No lymphadenopathy.  RESPIRATORY:  Positive diffuse expiratory wheezes, right greater than  left.  Left lower lobe crackles also noted.  No use of accessory muscles  of respiration.  Fair air movement.  CARDIOVASCULAR:  Regular rate and rhythm.  No murmurs, rubs or gallops.  ABDOMEN:  Soft, nontender, nondistended, positive bowel sounds.  EXTREMITIES:  No clubbing, cyanosis or edema.  NEUROLOGIC:  The patient is alert and oriented x3.  Cranial nerves II-  XII are grossly intact with no focal deficits.   HOSPITAL COURSE:  Problem #1.  Acute COPD exacerbation.  The patient was  admitted with an acute COPD exacerbation.  She was placed on oxygen via  nasal cannula and placed on nebulizer treatments as well as IV  antibiotics and IV Solu-Medrol as well as Mucinex on a p.r.n. basis.  The patient was monitored.  The patient improved slowly and clinically  during the hospitalization.  The patient remained afebrile and was  monitored.  Sputum Gram stain and cultures were also obtained as well as  cardiac enzymes were cycled secondary to her chest tightness which came  back negative.  The patient was maintained on Avelox.  Avelox was  subsequently changed to oral Avelox and her steroids were tapered on IV  Solu-Medrol from 80 mg IV q.8 h.. to Solu-Medrol 80 mg IV daily.  The  patient's Solu-Medrol will subsequently be changed to oral prednisone on  December 12, 2008.  The patient had requested to be seen per her  pulmonologist.  A call has been placed in for a consult for the patient  to be seen by her pulmonologist and the consultation is pending at the  time of this dictation.  The patient continued to improve clinically and  the patient will be  monitored.  Further medications and a steroid taper  will be dictated per discharging physician.  Problem #2.  Ischemic cardiomyopathy/ chronic systolic heart failure.  The patient's ischemic cardiomyopathy seemed to be stable during the  hospitalization.  The patient was not in any acute decompensated heart  failure.  Heart failure was compensated throughout this time in the  hospitalization.  Cardiac enzymes which were cycled were negative.  The  patient was maintained on  her home dose of her Lasix 20 mg daily and  followed the patient.  Also had requested to be seen per her  cardiologist and a consultation was placed for the patient to be seen  per her cardiologist.  However at this time during the hospitalization  the patient's ischemic cardiomyopathy and chronic systolic heart failure  have been stable and the patient has been maintained on her home  regimen.  Problem #3.  A-fib with RVR/ history of a left atrial thrombus.  On  admission the patient was noted to have a subtherapeutic INR.  However,  the patient remained in normal sinus rhythm.  The patient was rate  controlled on home regimen of metoprolol.  The patient was started on  Coumadin.  It was noted that the patient's INR was subtherapeutic and as  such the patient was bridged with heparin while on the Coumadin  secondary to a history of a left atrial appendage thrombus.  The patient  was maintained on both heparin and Coumadin and her Coumadin and heparin  remained in the therapeutic range for 48 hours before the heparin was  discontinued.  The patient will be monitored closely on her Coumadin and  it will be decided on discharge what dose of Coumadin to discharge the  patient home on as well as where to follow up for her INR check.  The  patient's A-fib with RVR remained stable and the patient remained in  sinus rhythm during this time of the hospitalization.   The rest of the patient's chronic medical issues remained  stable and the  patient was stable as of December 11, 2008.  Further discharge medications  as well as disposition on follow-up will be dictated per discharging  physician.  It was a pleasure taking care of Ms. Allene Pyo.      [name] Harvest, MD  Electronically Signed     DT/MEDQ  D:  12/11/2008  T:  12/11/2008  Job:  [id number]   cc:   [name], M.D.  Clinton D. Maple Hudson, MD, FCCP, FACP  [name]. Jens Som, MD, [facility]

## 2010-09-09 NOTE — Procedures (Signed)
EEG:  [id number].   CLINICAL HISTORY:  The patient is an 77 year old with early dementia.  Study is being done to look for the background activity.  (294.9)   PROCEDURE:  The tracing was carried out on a 32-channel digital Cadwell  recorder reformatted into 16 channel montages with one devoted to EKG.  The patient was awake during the recording.  The International 10/20  system of lead placement was used.   MEDICATIONS:  Lovenox, Lanoxin, Toprol-XL, Benicar, Synthroid, NovoLog,  Lasix, Lantus, Cipro, K-Dur, Coumadin, Ambien, Tylenol, Robitussin,  Zofran, and Xanax.   DESCRIPTION OF FINDINGS:  Dominant frequency is at 10 Hz, 20 microvolt  activity that is well regulated.  Background activity is predominately  alpha and beta range components.  Frontally predominant and generally  predominant 2 Hz, delta range activity of 90 microvolts was superimposed  upon this from time to time.  This is not continuous.   Photic stimulation induced a driving response of 3-9 Hz.  Hyperventilation was not carried out.   EKG showed atrial fibrillation with ventricular response of 78 beats per  minute.   IMPRESSION:  Abnormal EEG on the basis of diffuse background slowing  that is intermittent.  This is a nonspecific indicator of neuronal  dysfunction maybe on a primary degenerative basis or secondary to a  variety of toxic or metabolic etiologies.  Findings would correlate with  the patient's history of early dementia.      Deanna Artis. Sharene Skeans, M.D.  Electronically Signed     ZOX:WRUE  D:  04/09/2008 16:54:11  T:  04/10/2008 06:00:12  Job #:  [id number]

## 2010-09-09 NOTE — Discharge Summary (Signed)
NAME:  [name], [name]                 ACCOUNT NO.:  [id number]   MEDICAL RECORD NO.:  [id number]          PATIENT TYPE:  INP   LOCATION:  3702                         FACILITY:  [facility]   PHYSICIAN:  Clinton D. Maple Hudson, MD, FCCP, FACPDATE OF BIRTH:  1926/07/08   DATE OF ADMISSION:  05/25/2007  DATE OF DISCHARGE:  06/04/2007                               DISCHARGE SUMMARY   DISCHARGE DIAGNOSES:  1. Atrial fibrillation with rapid ventricular response.  2. Asthma.  3. Von Willebrand's disease.  4. Diabetes type 2 insulin-dependent.  5. Hypertension.  6. Hyperlipidemia.  7. Musculoskeletal pain.  8. Anxiety.   BRIEF HISTORY:  This is an 77 year old white female with a long history  of asthma and chronic atrial fibrillation, as well as diabetes.  She has  had several hospitalizations in the last few months, two were associated  with complaints of dyspnea associated with rapid palpitation. At the  present time she complained of gradually progressive dyspnea for  limiting her usual activities around the house, with nonproductive  cough.  In the emergency room saturation was 97% on 2 liters with an  electrocardiogram showing atrial fibrillation at 108 jumping up to  between 120 and 140.  There was some upper airway noise relieved by  pursed-lip breathing but no wheeze.   She was admitted for cardiopulmonary stabilization of her chronic atrial  fibrillation with rapid ventricular response and exacerbation of  dyspnea.  Chest x-ray did show mild vascular congestion.   PAST MEDICAL HISTORY:  1. Coronary artery disease status post coronary bypass graft in 2000      with ejection fraction of about 50% in 2008.  A dobutamine Myoview      negative for inducible ischemia on April 29, 2007, and at that      time showing mild left ventricular dysfunction with an EF of 43%.  2. Chronic atrial fibrillation, not on Coumadin or aspirin because of      her von Willebrand disease.  3. Primarily asthma  than chronic bronchitis, without much fixed      obstructive airways disease on previous pulmonary function testing.  4. Diabetes mellitus type 2 followed by Dr. Lucianne Muss on insulin and oral      hypoglycemics.  5. Hypertension.  6. Hyperlipidemia.  7. Hypothyroid.   HOSPITAL COURSE:  She was admitted and nebulized medication regimens  were adjusted to minimize cardiac stimulation.  Pulmicort was added on a  scheduled basis to Xopenex 0.63.  Insulin was managed by scheduled and  sliding scale insulin.  Cardiology consultation was obtained and was  very helpful with final assessment by Dr. Diona Browner, being that a  pacemaker and AV nodal ablation were not currently necessary.  Good  heart rate control was demonstrated with ambulation in hospital.  Medicines continued at discharge. She was noted to be dyspneic  consistent primarily with deconditioning with physical therapy assisted  ambulation.  Oxygenation on room air with exertion dropped as low as 92%  with the patient complaining of dyspnea.  Primary problem appeared to be  deconditioning.  She had multiple somatic complaints  including  hoarseness which was variable without stridor or pain.  She complained  of right lateral back pain with no new findings on radiologic studies  and response to treatment as musculoskeletal pain.  Note that she had a  remote history of surgery to her jaw with bone graft taken from ribs.  She complained of ankle edema with no edema noted, at that time of her  complaint.  Blood sugars were addressed and followed noting CBG  continued to fall around 200 although she was not getting systemic  steroids.  She was getting nebulized Pulmicort. She insists that on this  regimen her control at home is quite good and she is to follow up with  Dr. Lucianne Muss.  Medical DVT prophylaxis was not performed because of her von  Willebrand history.  She was discharged home with condition improved but  with impression that she tends  to be discouraged and has multiple  somatic complaints which can best be addressed as an outpatient.   LABORATORY:  EKG: Showed atrial fibrillation with digitalis effect, or  possibly inferolateral ischemia.  The admission EKG at January 29,  showed atrial fibrillation with rapid ventricular response and marked ST  abnormality, possible inferior subendocardial injury.  Chest x-ray on  admission showed cardiomegaly with slight worsening aeration  suggestive  of early CHF.  Unilateral right rib films showed stable chronic right  seventh rib deformity, no acute fractures.  A downstairs PA and lateral  chest x-ray on June 02, 2007, was stable with cardiomegaly and  chronic vascular congestion.  As of February 6, the WBC was 6100 with  hemoglobin of 11.2, platelet count of 278,000 and electrolytes were  unremarkable, glucose 233, BUN 7, creatinine 0.94.  Liver enzymes were  unremarkable.  Cardiac enzymes had not shown injury.   DISCHARGE PLANS:  She is on a diabetic diet prescribed by Dr. Lucianne Muss.  Activity will progress gradually as tolerated.  Doxazosin 4 mg daily,  Lanoxin 0.25 mg daily, Diltiazem 240 mg b.i.d., nebulized Xopenex 0.63  four times daily if needed, Synthroid 0.2 mg plus 0.05 mg daily, Lantus  40 units each morning, Humalog 10 mg before each meal, glipizide 10 mg  daily, Ambien CR 12.5 mg if needed for sleep, Tylenol Extra Strength if  needed for pain, Phenergan with Codeine cough syrup 1 teaspoon every 4  hours if needed for cough, niacin 500 mg daily.   She will call my office for an appointment in followup in 2-3 weeks and  will keep appointment with Dr. Lucianne Muss scheduled in March.  She will  follow up with Dr. Diona Browner and [facility] as scheduled.  She is  discharged having reached maximum hospital benefit with condition  improved and better heart rate controlled.      Clinton D. Maple Hudson, MD, Tonny Bollman, FACP  Electronically Signed     CDY/MEDQ  D:  06/03/2007   T:  06/06/2007  Job:  [id number]   cc:   [name], M.D.  Jonelle Sidle, MD

## 2010-09-09 NOTE — Telephone Encounter (Signed)
Called spoke with pt's daughter [name], pt has been discharged from [facility], pt went to primary MD in [address] [place] office and had Coumadin checked.  Pt will continue to follow Coumadin there since it is 5 minutes from the house and it is difficult to get pt to and from appointments.

## 2010-09-09 NOTE — Consult Note (Signed)
[name], [name]                 ACCOUNT NO.:  [id number]   MEDICAL RECORD NO.:  [id number]          PATIENT TYPE:  INP   LOCATION:  4738                         FACILITY:  [facility]   PHYSICIAN:  Hillis Range, MD       DATE OF BIRTH:  06-20-1924   DATE OF CONSULTATION:  DATE OF DISCHARGE:                                 CONSULTATION   REQUESTING PHYSICIAN:  Peter C. Eden Emms, MD, [facility]   REASON FOR CONSULTATION:  Tachycardia bradycardia syndrome.   HISTORY OF PRESENT ILLNESS:  [name] is a pleasant 77 year old female  with a history of persistent atrial fibrillation, coronary artery  disease status post CABG, and an ischemic cardiomyopathy (ejection  fraction 35%), who now presents with symptomatic atrial fibrillation  with rapid ventricular rates.  She reports progressive symptoms of  shortness of breath and fatigue.  Attempts at rate control have recently  been very difficult due to symptomatic bradycardia.  She was recently  hospitalized on February 24, 2008, through February 28, 2008, for similar  symptoms.  She went home and had worsening shortness of breath and  therefore, returned.  This is all presumed to be secondary to a rapid  ventricular response.  The patient chronically has von Willebrand  disease and has not felt to be a candidate for Coumadin therapy.  A  recent transesophageal echocardiogram confirmed a left atrial appendage  thrombus.  She is, therefore, not a candidate for rhythm control.  She  reports occasional episodes of heart racing.  Throughout her hospital  stay, her heart rate has consistently been above 100 beats per minute  and at times has been in the 130s.  The patient has had decline in her  ejection fraction over the past year.  In January 2009, her ejection  fraction was felt to be 43% and in December, it was felt to be 50%.  Most recently, an echocardiogram reveals an ejection fraction of 35%.  The patient denies worsening symptoms of chest pain,  presyncope, or  syncope.  She is, otherwise, without complaint at this time.   PAST MEDICAL HISTORY:  1. Permanent atrial fibrillation (as above).  2. Coronary artery disease status post CABG in 2000.  3. Von Willebrand disease.  4. Ischemic cardiomyopathy (ejection fraction presently 35%).  5. New York Heart Association class II/III heart failure symptoms      chronically.  6. Moderate-to-severe tricuspid regurgitation.  7. Hypothyroidism.  8. Diabetes mellitus.  9. Hypertension.  10.Hyperlipidemia.   CURRENT MEDICATIONS:  1. Levothyroxine 225 mcg daily.  2. Metoprolol 25 mg b.i.d.  3. Lasix 20 mg daily.  4. Niacin 500 mg at bedtime.  5. Benicar 10 mg daily.  6. Lantus insulin 40 units daily.  7. Sliding scale insulin.  8. Digoxin 0.125 mg daily.  9. Ambien 5 mg at bedtime p.r.n.   ALLERGIES:  SULFA, AUGMENTIN, BIAXIN, KEFLEX, and DICYCLOMINE.   SOCIAL HISTORY:  The patient lives in [place] with her husband.  She  denies alcohol, tobacco, or drug use.   FAMILY HISTORY:  The patient's mother had von Willebrand disease  with  excessive bleeding.  She has a family history of coronary artery  disease.   REVIEW OF SYSTEMS:  All systems were reviewed and negative except as  outlined above.   PHYSICAL EXAMINATION:  VITAL SIGNS:  Blood pressure 127/93, heart rate  108, respirations 20, sats 94% on room air, temperature 99.3.  GENERAL:  The patient is an elderly female in no acute distress.  She is  alert and oriented x3.  HEENT:  Normocephalic, atraumatic.  Sclerae clear.  Conjunctivae pink.  Oropharynx clear.  NECK:  Supple.  No JVD, lymphadenopathy, or bruits.  LUNGS:  Clear to auscultation bilaterally.  HEART:  Irregularly irregular rhythm, 2/6 systolic ejection murmur along  the left sternal border.  GI:  Soft, nontender, and nondistended.  Positive bowel sounds.  EXTREMITIES:  No clubbing or cyanosis.  Trace lower extremity edema  bilaterally.  NEUROLOGIC:  Strength  and sensation are intact.  SKIN:  No ecchymoses or lacerations.  MUSCULOSKELETAL:  No deformity or atrophy.  PSYCHIATRIC:  Euthymic mood, full affect.   LABORATORY DATA:  TSH 2.429.  BNP 397, creatinine 1.19.  Hematocrit 41,  platelets 249.   EKG reveals atrial fibrillation with a ventricular rate of 107 beats per  minute, otherwise unremarkable.   Telemetry reveals atrial fibrillation with heart rates in the 110-130s.   IMPRESSION:  [name] is a pleasant 77 year old female with a history  of coronary artery disease, ischemic cardiomyopathy, New York Heart  Association class II/III heart failure symptoms chronically, and  permanent atrial fibrillation, who now presents with recurrent symptoms  of shortness of breath and heart racing secondary to rapid ventricular  rates.  The patient has been unable to tolerate rate control medications  in the past including metoprolol and diltiazem.  She is presently  treated with digoxin without good rate control.  She has a left atrial  thrombus and is therefore not a candidate for rhythm control.  Coumadin  has previously been disregarded due to von Willebrand disease.  Upon  review of the patient's prior left ventricular studies, it appears that  her ejection fraction has declined over this past year and I suspect  that this is largely due to rapid ventricular rates of atrial  fibrillation rather than underlying worsening of her ischemic heart  disease.  Though the patient presently meets criteria for defibrillator,  I have some reservations regarding this.  I certainly think that the  best option for this patient would be permanent pacing with a pacemaker  and then aggressive medicine management for atrial fibrillation  including metoprolol and diltiazem if needed.  I am concerned that as  the patient has a defibrillator placed that she may have rapid  ventricular rate secondary to rapidly conducted AFib and can certainly  receive shocks  which could convert her to sinus rhythm and potentially  cause stroke given her known left atrial thrombus.  I also wonder if we  were to aggressively treat the patient's rapid ventricular rates if her  ejection fraction might not improve back to her baseline of  approximately 45% as documented back in December and January 2009.   PLAN:  Therapeutic strategies for tachycardia bradycardia syndrome were  discussed in detail with the patient today.  Risks, benefits, and  alternatives to both pacemaker and defibrillator implantation were  discussed at length with the patient today.  Risks include, but are not  limited to bleeding, infection, vascular damage, pericardial effusion  with lead perforation, lead dislodgement, and stroke.  The patient  understands these risks.  She wishes to contemplate the possibility of  pacemaker versus defibrillator over the weekend.  Again, my preference  would be to place a dual-chamber pacemaker and then to aggressively  manage the patient's atrial fibrillation thereafter.  I have tentatively  scheduled the patient for a dual-chamber pacemaker to be implanted on  Monday.  She has previously required DDAVP prior to procedures in the  past.      Hillis Range, MD  Electronically Signed     JA/MEDQ  D:  03/02/2008  T:  03/03/2008  Job:  [id number]   cc:   Noralyn Pick. Eden Emms, MD, [facility]

## 2010-09-09 NOTE — Discharge Summary (Signed)
[name], [name]                 ACCOUNT NO.:  [id number]   MEDICAL RECORD NO.:  [id number]          PATIENT TYPE:  INP   LOCATION:  4743                         FACILITY:  [facility]   PHYSICIAN:  Jesse Sans. Wall, MD, FACCDATE OF BIRTH:  March 04, 1925   DATE OF ADMISSION:  06/13/2008  DATE OF DISCHARGE:  06/19/2008                               DISCHARGE SUMMARY   PRIMARY CARDIOLOGIST:  [name]. Jens Som, MD   PRIMARY CARE PHYSICIAN:  Dr. Lucianne Muss.   PULMONOLOGY:  Clinton D. Maple Hudson, MD.   [facility] FOLLOWUP:  Shelby Dubin, PharmD   DISCHARGE DIAGNOSIS:  Acute on chronic diastolic congestive heart  failure.   SECONDARY DIAGNOSES:  1. Subacute right nondisplaced anterolateral 4th rib fracture.  2. Acute on chronic obstructive pulmonary disease with wheezing.  3. History of atrial fibrillation status post atrioventricular nodal      ablation and placement of a [employer] biventricular pacemaker      May 29, 2008.  4. Coronary artery disease status post coronary artery bypass graft x4      in 2000, with placement of sequential vein graft at obtuse marginal      1 and obtuse marginal 2 as well as a vein graft to the right      coronary artery.  5. Ischemic cardiomyopathy with prior ejection fraction of 35% in      October 2009 with most recently recorded to be 55% in February      2010.  6. Hypothyroidism with recent Synthroid adjustment secondary to      overtreatment followed by Dr. Lucianne Muss.  7. Hypertension.  8. Hyperlipidemia.  9. Chronic Coumadin anticoagulation.  10.Question of von Willebrand disease.  11.History of pulmonary hypertension.  12.History of multiple falls.  13.Diabetes mellitus.  14.History of pernicious anemia.  15.History of noncompliance.  16.Mild dementia.   ALLERGIES:  1. SULFA.  2. AUGMENTIN.  3. BIAXIN.  4. KEFLEX.   PROCEDURES:  None.   HISTORY OF PRESENT ILLNESS:  [name] is an 77 year old Caucasian  female with  the above problem list.  She was recently discharged home  from [facility] June 02, 2008 following admission for  nausea, weakness and subsequently found to have poorly controlled atrial  fibrillation with subsequent biventricular pacemaker placement and AV  nodal ablation.  The patient followed up in our office for a wound check  on February 17 and at that time complained of becoming acutely short of  breath the evening prior.  She also reported a recent fall with right  back and shoulder pain.  In the office she was noted to be volume  overloaded and decision was made to admit her for acute on chronic  diastolic congestive heart failure.   HOSPITAL COURSE:  The patient was placed on IV diuretics without  immediate improvement.  She was noted to be wheezing and pulmonology was  consulted.  She was seen by Dr. Jetty Duhamel and her home Xopenex  dosage was renewed.  It was recommended that she use her oxygen 24 hours  a day at  2 liters per minute.  With continued diuresis, she did have  symptomatic improvement as well as resolution of lower extremity edema.  Her Lasix was switched back to an oral formulation and she was enrolled  in the home Lasix trial through the [facility] and [facility] and  she will be provided with 30 days' worth of her home dose.  She is being  discharged home today in good condition.   DISCHARGE LABS:  Hemoglobin 13.1, hematocrit 38.6, WBC 10.7, platelets  347, INR 2.3, sodium 139, potassium 4.2, chloride 99, CO2 33, BUN 21,  creatinine 0.94, glucose 140, total bilirubin 0.5, alkaline phosphatase  102, AST 28, ALT 28, total protein 6.3, albumin 3.0, calcium 9.7,  magnesium 2.0.  BNP was 359 on admission.  TSH 0.804.   DISPOSITION:  The patient will be discharged home today in good  condition.   FOLLOW-UP PLANS AND APPOINTMENTS:  [name] will follow up with  [facility] on June 26, 2008 at 9:00 a.m.  She  will then  see Dr. Jens Som at 9:30 a.m. on June 26, 2008.  She is to  follow up with Dr. Jetty Duhamel with [facility] on July 17, 2008 at 3:45 p.m.  She is asked to follow up with Dr. Lucianne Muss as  previously scheduled.   DISCHARGE MEDICATIONS:  1. Xopenex nebulizer 0.63 mg q.i.d. p.r.n.  2. Oxygen 2 liters per minute continuously.  3. Lasix 40 mg daily.  4. Synthroid 200 mcg daily.  5. Crestor 10 mg daily.  6. Metoprolol XL 50 mg daily.  7. Benicar 5 mg daily.  8. Lovaza 1 gram q.i.d.  9. Niacin 500 mg q.h.s.  10.Lantus 46 units q.h.s.  11.Humalog 12 units t.i.d. with meals plus sliding scale.  12.Vitamin B12 injection 1000 mcg twice a month.  13.Nitroglycerin 0.4 sublingual p.r.n. chest pain.  14.Coumadin 5 mg 1 tablet Tuesday, Wednesday, Thursday, Saturday,      Sunday; 1-1/2 tablets Mondays and Fridays.   OUTSTANDING LAB STUDIES:  Followup Coumadin Clinic June 26, 2008.   DURATION DISCHARGE:  Sixty minutes including physician time.      [name], ANP      Jesse Sans. Daleen Squibb, MD, [facility]  Electronically Signed    CB/MEDQ  D:  06/19/2008  T:  06/19/2008  Job:  [id number]   cc:   Joni Fears D. Maple Hudson, MD, FCCP, FACP  [name], M.D.

## 2010-09-09 NOTE — H&P (Signed)
[name], [name]                 ACCOUNT NO.:  [id number]   MEDICAL RECORD NO.:  [id number]          PATIENT TYPE:  INP   LOCATION:  2027                         FACILITY:  [facility]   PHYSICIAN:  [name]. Jens Som, MD, FACCDATE OF BIRTH:  01-01-1925   DATE OF ADMISSION:  02/24/2008  DATE OF DISCHARGE:                              HISTORY & PHYSICAL   [name] is an 77 year old female who I recently met on February 10, 2008.  She has a history of coronary artery disease, status post  coronary artery bypassing graft performed in 2000.  At that time, she  had LIMA to the LAD, saphenous vein graft to the first diagonal and  sequential saphenous vein graft to the first OM and second OM.  There is  also saphenous vein graft to the distal right coronary artery.  She has  a history of permanent atrial fibrillation as well.  In reviewing her  records, her last Myoview was performed in January 2009.  At that time,  her ejection fraction was 43% and there was no ischemia.  An abdominal  ultrasound in November 2008 was technically difficult, but showed  probable normal aorta.  ABIs were also normal.  Apparently, there has  been some question of compliance in the past with elevated heart rates  with her atrial fibrillation.  When I saw her, her heart rate was  elevated.  We scheduled her to have an echocardiogram, which was  performed on February 17, 2008.  The windows were difficult.  Her  ejection fraction was felt to be 35% with hypokinesis/akinesis of the  inferior and septal walls.  There is mild mitral regurgitation.  There  is dilatation of the left atrium, right ventricle, and right atrium.  There was moderate tricuspid regurgitation.  She also had a monitor that  showed atrial fibrillation with rates as high as 170.  Since that time,  she has had dyspnea on exertion which has been a chronic issue.  There  is occasional orthopnea, but there is no PND or increasing pedal edema.  She has  not had chest pain or syncope.  She does occasionally feel  palpitations.   Her medications at present include,  1. Fish oil b.i.d.  2. Flaxseed oil.  3. Cinnamon.  4. Lutein.  5. Multivitamin.  6. Synthroid 225 mcg p.o. daily.  7. Doxazosin 4 mg p.o. daily.  8. Insulin.  9. Vitamin B12.  10.Digoxin 0.25 mg p.o. daily.  11.Metoprolol 12.5 mg p.o. b.i.d.  12.Lasix 20 mg p.o. daily.  13.Cardizem 180 mg p.o. b.i.d.  14.Niacin 250 mg p.o. b.i.d.  15.Calcium.   She has allergies to SULFA, AUGMENTIN, BIAXIN, KEFLEX, and DICYCLOMINE.   SOCIAL HISTORY:  She does not smoke nor does she consume alcohol.   Her family history is positive for coronary artery disease in her  father.   Her past medical history is significant for diabetes, hypertension, and  hyperlipidemia.  She also has a history of hypothyroidism.  She has a  history of pneumonia as well as asthma.  There is a history of  coronary  artery disease and she is status post coronary artery bypassing graft  described in the HPI.  She also has a history of permanent atrial  fibrillation.  She has a history of von Willebrand as well.  She has had  previous hernia repair.  She has also had prior alters by her report.   REVIEW OF SYSTEMS:  She denies any headaches or fevers or chills.  She  does have a cough, but there is no hemoptysis.  She denies any  dysphagia, odynophagia, melena, or hematochezia.  There is no dysuria or  hematuria.  There is no rash or seizure activity.  There is no  orthopnea, but there is no PND.  She denies any pedal edema.  The  remaining systems are negative.   PHYSICAL EXAMINATION:  VITAL SIGNS:  Today shows a blood pressure of  146/101 and a pulse is 132.  She weighs 206 pounds.  GENERAL:  She is well developed and obese.  She is no acute distress at  present.  SKIN:  Warm and dry.  She does not appear to be depressed.  There is no  peripheral clubbing.  BACK:  Normal.  HEENT:  Normal with normal  eyelids.  NECK:  Supple with normal upstroke bilaterally.  There is no bruits  noted.  There is no jugular venous distention.  I cannot appreciate  thyromegaly.  CHEST:  Clear to auscultation with normal expansion.  CARDIOVASCULAR:  Tachycardic rate and irregular rhythm.  ABDOMEN:  No tenderness.  There is no masses palpated and there is no  bruit noted.  There is no hepatosplenomegaly.  She has 2+ femoral pulses  bilaterally.  No bruits.  EXTREMITIES:  No edema.  I can palpate no cords.  There is 2+ dorsalis  pedis pulses bilaterally.  NEUROLOGIC:  Grossly intact.   Her electrocardiogram shows atrial fibrillation with a rapid ventricle  response of 137.  The axis is normal.  There is a prior septal infarct  and nonspecific ST changes.   DIAGNOSES:  1. Atrial fibrillation - [name] continues to have atrial      fibrillation with rates that are elevated.  She is at 137 at rest      and her monitor showed rates as high as 170.  I think she needs to      be admitted and we need to improve her rate control.  She will      continue on her Cardizem, but I will change this to 240 mg p.o.      b.i.d.  We will also continue with digoxin 0.25 mg p.o. daily and I      will increase her Lopressor 25 mg p.o. b.i.d.  If we are unable to      control her heart rate, then she may require atrioventricular node      ablation and pacemaker placement.  Note, she is not on Coumadin or      aspirin due to a history of von Willebrand disease.  2. Ischemic cardiomyopathy - we will add an ARB (Micardis 80 mg p.o.      daily) and she will continue on her Lopressor.  We will change this      to Toprol once her dose has been fully titrated.  We will plan to      repeat her echocardiogram after her rate is being controlled and if      her ejection fraction has 30 or less, then she will  require an      implantable cardioverter-defibrillator given her history of      ischemic cardiomyopathy.  3. Coronary  artery disease, status post coronary artery bypassing      graft - she has not tolerated statins as she not on aspirin due to      her von Willebrand's disease.  She will continue on her beta-      blocker and we are adding an ARB.  4. Diabetes mellitus - we will continue with her insulin.  5. Hypertension - her blood pressure is elevated, but we are adjusting      her medicines.  6. Hyperlipidemia - we will check lipids.  7. Hypothyroidism.      [name] Jens Som, MD, [facility]  Electronically Signed     BSC/MEDQ  D:  02/24/2008  T:  02/25/2008  Job:  [id number]

## 2010-09-09 NOTE — Consult Note (Signed)
[name], [name]                 ACCOUNT NO.:  [id number]   MEDICAL RECORD NO.:  [id number]          PATIENT TYPE:  INP   LOCATION:  3303                         FACILITY:  [facility]   PHYSICIAN:  [name], M.D.    DATE OF BIRTH:  01-Apr-1925   DATE OF CONSULTATION:  08/10/2008  DATE OF DISCHARGE:                                 CONSULTATION   REFERRING PHYSICIAN:  Dr. Janee [name] of the [facility]   We were asked to see [name] in consultation today for lower GI  bleeding by Dr. Janee [name] of the [facility].   HISTORY OF PRESENT ILLNESS:  This is an 77 year old female with severe  heart disease and multiple medical problems who reports;  1. A single episode of long-lasting burning epigastric pain followed      by a bowel movement with a large amount of bright red blood in the      toilet water.  This episode was associated with abdominal bloating      yesterday.  She has had no further bleeding since.  2. She complains of right-sided abdominal/rib/back pain that is      ongoing.  She has been told that she is not a candidate for      cholecystectomy.  3. She does have chronic epigastric burning.  She is not on a PPI      because she believes it was less than the effect of her antibiotic.      She is on doses of prednisone rather frequently for her COPD.  Her      last colonoscopy I believe was in 2005, and it was found to be      normal.  She does have a history of colon polyps.  She is scheduled      for followup colonoscopy in April 2010 with Dr. Randa [name].  Her last      endoscopy is on the chart, but was done in 2007.  She was found to      have hiatal hernia, a few gastric polyps, and otherwise it was      normal exam to the duodenum.   PRIMARY CARE PHYSICIAN:  Dr. Lucianne [name].   CARDIOLOGIST:  [name]. [name] Som, MD, [facility]   PULMONOLOGIST:  [name] Chris. Maple Hudson, MD, [name], FACP   GASTROENTEROLOGIST:  [name]. [name] Evens, MD   PAST MEDICAL HISTORY:   Significant for atrial fibrillation with RVR,  also she has an atrial appendage.  For these two, she is on chronic  Coumadin.  She is status post pacemaker placement in February 2010.  She  has pernicious anemia, a history of von Willebrand.  She was diagnosed  with diabetes in 1988.  She has a history of hyperlipidemia, obesity,  and hypothyroidism.  She has a history of diabetic neuropathy, COPD,  colon polyps with a normal colonoscopy in 2005, and diverticulosis in  the sigmoid.  She has had 4 hemorrhoidectomies.   CURRENT MEDICATIONS:  Coumadin and frequent doses of prednisone.  The  rest of her medications are per chart.  She denies being  on aspirin,  only Tylenol.   ALLERGIES:  She has allergies to the following medications:  ATORVASTATIN, VALDECOXIB, LABETALOL, DICYCLOMINE, TEGRETOL, SULFA,  TRILEPTAL, AUGMENTIN, CLARITHROMYCIN, and CEFACLOR.   REVIEW OF SYSTEMS:  Significant for HPI as well as hemoptysis.   SOCIAL HISTORY:  Negative for tobacco, alcohol, and drugs.   FAMILY HISTORY:  Significant for some with colon polyps.  She does not  know if anyone in the family has had stomach ulcers.   PHYSICAL EXAMINATION:  GENERAL:  She is alert and oriented, in no  apparent distress.  HEART:  Sounds regular at moment.  LUNGS:  She is slightly short of breath with no wheezes or crackles.  ABDOMEN:  Soft, nontender, and obese with good bowel sounds.   LABORATORIES:  Hemoglobin of 11.7, this is down from 12.1 yesterday, so  it is fairly stable.  Her hemoglobin on June 18, 2008, was 13.1,  hematocrit 31.5, white count 6.1, and platelets 183,000.  INR yesterday  was 3.7.  She has received FFP and her INR today is 2.1.  PT is 24.7.  LFTs are mildly elevated with an AST of 156, ALT 95, alk phos 117, and  total bilirubin 0.6.  Her BUN and creatinine are also slightly elevated  with a BUN of 41 and creatinine 1.46.  Her BUN and creatinine were  normal on August 03, 2008.  Chest x-ray  shows stable cardiomegaly and  COPD.  No acute changes.   ASSESSMENT:  1. Lower gastrointestinal bleed.  This could be ischemic colitis      versus hemorrhoids versus a diverticular bleed.  The patient has      been on prednisone and on Coumadin with a slightly elevated INR.  I      believe that this will resolve on its own, but she should be      watched until we are sure it does.  Transfuse p.r.n.  2. Epigastric burning.  3. Elevated BUN and creatinine as well as a mild elevation in her      LFTs.   PLAN:  Monitor for continued bleeding.  Her INR has been corrected.  We  will transfuse p.r.n.  It does not appear to be needed now.  Give  clears, IV Protonix.  Consider upper endoscopy for epigastric burning.  She is already scheduled for an outpatient colonoscopy with Dr. Randa [name]  in about 2 weeks.   Thank you very much for this consultation.       [name] Police, PA    ______________________________  [name], M.D.    MLY/MEDQ  D:  08/10/2008  T:  08/11/2008  Job:  [id number]   cc:   [name]., M.D.  Dr. Glendale Chard S. [name] Som, MD, [facility]  [place] D. Maple Hudson, MD, FCCP, FACP

## 2010-09-09 NOTE — H&P (Signed)
NAMEMARJORY, [name] NO.:  [id number]   MEDICAL RECORD NO.:  [id number]          PATIENT TYPE:  EMS   LOCATION:  ED                           FACILITY:  [facility]   PHYSICIAN:  Ramiro Harvest, MD    DATE OF BIRTH:  1924-07-17   DATE OF ADMISSION:  10/19/2008  DATE OF DISCHARGE:                              HISTORY & PHYSICAL   PRIMARY CARE PHYSICIAN:  [name], M.D.   CARDIOLOGIST:  [name]. Jens Som, MD, [facility]   PULMONOLOGIST:  Rennis Chris. Maple Hudson, MD, [facility], FACP   GASTROENTEROLOGIST:  Llana Aliment. Randa Evens, M.D.   HISTORY OF PRESENT ILLNESS:  [name] is an 77 year old white female  with multiple medical problems including A-Fib  with RPR, history of  ischemic cardiomyopathy, EF of 55% per echo in February 2010,  history  of the left atrial appendage thrombus, diabetes mellitus, hypertension,  hyperlipidemia, pernicious anemia who presented to the ED with a 1-day  history of epigastric and midsternal chest pain occurring at rest  described as a pressure sensation/ a fullness with some associated  palpitations, shortness of breath and productive cough, generalized  weakness and some subjective fevers.  The patient states that the chest  pain is intermittent and also had an episode of burning as well  associated with it.  The patient denied any emesis, no abdominal pain,  no diarrhea or constipation.  No melena or hematemesis. No hematochezia.  No focal neurological symptoms.  The patient does endorse some  generalized weakness.  The patient stated that she also fell 2 days  prior to admission and had some eye right foot pain and right rib pain.  The patient presented to the ED.  Urinalysis done was negative.  Lipase  of 29, CMET  with albumin of 3.3 and a protein 5.7, otherwise within  normal limits.  BNP of 280, PPT of 22.2, INR of 1.8.  Point of care  cardiac markers were negative.  CBC was within normal limits.  EKG  showed a paced rhythm.  Chest x-ray with  cardiomegaly with no acute  disease.  X-ray of right upper extremity was negative.  X-ray of the  foot showed a great toe fracture.  Head CT done was negative.  Abdominal  ultrasound was negative for acute cholecystitis. We were called to admit  the patient for further evaluation and management.   ALLERGIES:  1. Atorvastatin.  2. Augmentin.  3. Biaxin.  4. Carbamazepine.  5. Cefaclor.  6. Dicyclomine.  7. Labetalol.  8. Sulfur.  9. Tegretol.  10.Biaxin.   PAST MEDICAL HISTORY:  1. History of a bright red blood per rectum with a negative      colonoscopy April 2010 with small hemorrhoids.  2. COPD.  3. History of severe esophageal dysmotility.  4. History of UTIs.  5. Paroxysmal atrial fibrillation  status post atrioventricular nodal      ablation and placement of a biventricular pacemaker February 2010.      6.  History of ischemic cardiomyopathy EF of 55% per echo of      February 2010,  a history of CHF.  6. History of coronary artery disease status post CABG times 4 in      2000.  7. History of left atrial appendage thrombus.  8. Diabetes mellitus.  9. Hypertension.  10.Hyperlipidemia.  11.History of von Willebrand's disease.  12.History of pulmonary hypertension.  13.History of multiple falls.  14.History of pernicious anemia.  15.History of noncompliance.  16.History of mild dementia.  17.Hypothyroidism.   HOME MEDICATIONS:  1. Synthroid 200 mcg s p.o. daily.  2. Lasix 20 mg p.o. daily.  3. Benicar 5 mg p.o. daily.  4. Coumadin 5 mg on Tuesdays, Wednesdays, Fridays Saturdays and      Sundays and 7.5 mg on Mondays and Thursdays.  5. Metoprolol 100 mg p.o. daily.  6. Crestor 10 mg p.o. q.h.s.  7. Potassium chloride 10 mEq p.o. daily.  8. Lantus 42 units daily and sliding scale insulin with Humalog 10      units with meals.   SOCIAL HISTORY:  The patient is married.  No tobacco use.  No alcohol  use.  No IV drug use.   FAMILY HISTORY:  Father deceased age 21  from coronary artery disease.  Mother deceased age 2 from a CVA. Has one sister who is deceased age  42 from CVA.   REVIEW OF SYSTEMS:  As per HPI, otherwise negative.   PHYSICAL EXAMINATION:  Temperature 98.2, blood pressure 116/ 68, pulse  of 81, respirations 22, saturating  94% on room air.  GENERAL:  Patient lying on gurney in no apparent distress.  Chest pain  free.  HEENT: Normocephalic, atraumatic.  Pupils equal, round and reactive to  light and accommodation.  Extraocular movements intact.  Oropharynx is  clear.  No lesions or exudates.  NECK:  Supple.  No lymphadenopathy.  RESPIRATORY:  Lungs are clear to auscultation bilaterally.  No wheezes,  no crackles or rhonchi.  CARDIOVASCULAR:  Regular rate and rhythm.  No murmurs, rubs, gallops.  ABDOMEN:  Soft, nontender, nondistended.  Positive bowel sounds.  EXTREMITIES:  No clubbing, cyanosis or edema.  NEUROLOGIC:  The patient is alert and oriented x3.  Cranial nerves II-  XII are grossly intact.  No focal deficits.   ADMISSION LABS:  Sodium of 141, potassium of 4.1, chloride of 104,  bicarb of 29, BUN 8,  creatinine 0.88, glucose of 145, bilirubin 0.7,  alk phosphatase 79, AST 33, ALT 27, calcium of 9.0, albumin 3.3, protein  of 5.7, BNP of 280, PTT of 36, PT of 22.2, INR of 1.8.  Point of care  cardiac markers CK-MB less than 1.0,, troponin I less than 0.05,  myoglobin of 59.  CBC showed a white count of 5.5, hemoglobin 12.4,  hematocrit 37.9, platelets of  212,  absolute neutrophil count of 3.2.  Lipase which was done was 29.  Urinalysis was yellow clear specific  gravity 1.011, pH of 7.5, glucose negative, bilirubin negative, ketones  negative, blood negative, protein negative, urobilinogen 1.0, nitrite  negative, leukocytes negative.   A CT of the head without contrast showed negative cranial CT scan for  acute disease, chronic white matter small vessel disease.  Abdominal ultrasound showed no gallstones, minimal  gallbladder wall  thickening. No biliary ductal dilatation suspicious for diffuse fatty  infiltration of the liver, poor visualization of the pancreas. X-rays of  the humerus was no acute findings.  X-ray of the right foot showed a  fracture of the proximal phalanx of the great toe, remote healed fifth  metatarsal  fracture.  Chest x-ray done showed cardiomegaly without acute  disease, stable compared to prior exam.  EKG showed electronic  ventricular pacemaker.   ASSESSMENT AND PLAN:  Ms. Mikel Pyon an 77 year old female with history  of multiple medical problems including coronary artery disease status  post coronary artery bypass graft,  history of atrial fibrillation,  history of hypertension, diabetes, hyperlipidemia who presented to the  ED with midsternal and epigastric pain.   1. Chest pain /epigastric pain.  Differential includes acute coronary      syndrome versus GI.  Doubt musculoskeletal as chest pain is not      reproducible.  We will admit the patient to telemetry.  Cycle      cardiac enzymes q.8 h. x3.  Check a TSH.  Check amylase, check a      sputum Gram stain and culture.  Check a fasting lipid panel.  Check      coags.  Check a PA and lateral chest x-ray in the morning.  Place      on O2 nitroglycerin, morphine sulfate, Lasix, beta-blocker, Benicar      and GI cocktail p.r.n.  Will follow and consult with cardiology in      the morning for further evaluation and management.  2. Atrial fibrillation.  Will place on home dose metoprolol for rate      control,  Coumadin for anticoagulation.  3. Ischemic cardiomyopathy, ejection fraction of 55%.  See problem #1.      Continue home medications.  4. History of heart failure, stable.  The patient is asymptomatic.      Continue home dose Lasix, beta-blocker and Benicar and follow.  5. Hypothyroidism.  Check a TSH.  Continue Synthroid.  6. Hypertension.  Continue home regimen.  7. Severe esophageal dysmotility.  Protonix.   8. Hyperlipidemia.  Check a fasting lipid panel.  Crestor.  9. Type 2 diabetes.  Last hemoglobin A1c was 8.8 in April 2010.  Will      continue home dose Lantus and sliding scale insulin.  10.Chronic obstructive pulmonary disease. O2 nebs and nebs as needed.  11.Anemia.  Follow H and H.  12.Prophylaxis.  Protonix for GI prophylaxis.  Lovenox for DVT      prophylaxis.     It has been a pleasure taking care of Ms. Allene Pyo.      Ramiro Harvest, MD  Electronically Signed     DT/MEDQ  D:  10/19/2008  T:  10/19/2008  Job:  [id number]   cc:   [name], M.D.  Fax: [phone]   [name]. Jens Som, MD, [facility]  [address]  [address]  [place]  [place] [address]   Joni Fears D. Maple Hudson, MD, FCCP, FACP  [facility]  [address]  [place]  [place] [address]   Llana Aliment. Malon Kindle., M.D.  Fax: [phone]

## 2010-09-09 NOTE — Discharge Summary (Signed)
[name], [name] NO.:  [id number]   MEDICAL RECORD NO.:  [id number]          PATIENT TYPE:  INP   LOCATION:  3715                         FACILITY:  [facility]   PHYSICIAN:  Jonelle Sidle, MD DATE OF BIRTH:  [birth year]   DATE OF ADMISSION:  04/08/2007  DATE OF DISCHARGE:  04/15/2007                               DISCHARGE SUMMARY   PRIMARY CARDIOLOGIST:  Jonelle Sidle, M.D.   PRIMARY PULMONOLOGIST:  Rennis Chris. Maple Hudson, MD, FCCP, FACP.   PRIMARY CARE PHYSICIAN:  [name], M.D.   DISCHARGE DIAGNOSIS:  Asthmatic bronchitis exacerbation.   SECONDARY DIAGNOSES:  1. Atrial fibrillation with a rapid ventricular response.  2. Coronary artery disease, status post coronary artery bypass graft.      Previously documented ejection fraction of 50%.  3. Type 2 diabetes mellitus.  4. Hypothyroidism.  5. Von Willebrand's disease.  6. Remote tobacco abuse.   ALLERGIES:  NIACIN, ATORVASTATIN, VALDECOXIB, LABETALOL, MACROLIDES,  DICYCLOMINE, TEGRETOL, KEFLEX, SULFA, TRILEPTAL.   PROCEDURE:  A 2D echocardiogram.   HISTORY OF PRESENT ILLNESS:  An 77 year old married Caucasian female  with the above extensive problem list who was in his usual state of  health until April 04, 2007 when she was seen by Dr. Diona Browner and  noted to be in rapid A fib.  She was initiated on Diltiazem 180 mg daily  at that point.  She did okay until December 12th when she had the sudden  onset of rapid heart rate with dyspnea and cough.  She presented to  [facility] emergency room, where she was found to be in A fib with  RVR, and she was subsequently transferred to [facility] for further  evaluation.   HOSPITAL COURSE:  At [facility], she was placed on IV Diltiazem with adequate  rate control.  She was felt to be suffering from an exacerbation of COPD  and asthmatic bronchitis, and she was placed on multiple inhalers and  inhaled and oral steroids along with Avelox therapy.  She was  slow to  recover from a respiratory status and had persistent wheezing and was  seen by Dr. Jetty Duhamel on December 16th, who recommended continuation  of current steroid inhaler therapy.  With ongoing therapies, she has had  improvement in asthmatic symptoms and dyspnea overall.   With regards to her atrial fibrillation, her rate was reasonably well  controlled on IV Diltiazem, and she was subsequently placed on oral  Diltiazem.  Initially, she did have some increase in rate requiring  repeat IV Diltiazem; however, this was again discontinued in favor of  oral Diltiazem.  She is currently on Diltiazem ER 24 mg in the a.m. with  180 mg in the p.m. and is tolerating this well with improved rate  control.  She is not on Coumadin secondary to her history of von  Willebrand's disease.   Overall, Ms. Behrens has improved, and we have arranged for her to follow  up with both Dr. Diona Browner and Dr. Maple Hudson in approximately two weeks.  She  is being discharged home today in good condition.  DISCHARGE LABS:  Hemoglobin 12, hematocrit 35.4, WBC 7.6, platelets 322.  Sodium 138, potassium 4.7, chloride 103, CO2 31, BUN 16, creatinine  0.88, glucose 155, calcium 9.4, total bilirubin 0.4, alkaline  phosphatase 99, AST 48, ALT 97, total protein 5.8, albumin 3.4,  magnesium 1.9.  A1C 6.8.  CK 135, MB 2.8, troponin I 0.02.  BNP 162.  Total cholesterol 154, triglycerides 106, HDL 52, LDL 81, TSH 2.679.   DISPOSITION:  Patient is being discharged home today in good condition.   FOLLOW-UP PLANS/APPOINTMENTS:  We have arranged for followup with Dr.  Nona Dell on December 31 at 4:00 p.m.  She is to follow up with  Dr. Joni Fears in pulmonology on January 2nd at 2:40 p.m.   DISCHARGE MEDICATIONS:  1. Prednisone 15 mg daily.  2. Xopenex nebulizer 1.25 mg t.i.d.  3. Pulmicort Flexihaler 90 mcg 1 puff b.i.d.  4. Pulmicort nebulizer 0.5 mg b.i.d.  5. Aspirin 81 mg daily.  6. Levothyroxine 200 mcg as  prescribed.  7. Lantus 40 units nightly.  8. Humalog 10 units in the a.m., 6 units at lunch, and 6 units at      dinner.  9. Diltiazem ER 240 mg in the a.m., 180 mg in the p.m.  10.Prilosec OTC 20 mg daily.  11.Nitroglycerin 0.4 mg sublingual p.r.n. chest pain.  12.Niacin 500 mg nightly.  13.Colace 200 mg daily.  14.Mucinex 600 mg 2 tabs b.i.d.  15.Ambien 10 mg nightly.  16.Nitroglycerin 0.4 mg sublingual p.r.n. chest pain.   OUTSTANDING LAB STUDIES:  None.   DURATION OF DISCHARGE ENCOUNTER:  68 minutes, including physician time.      [name], [name]      Jonelle Sidle, MD  Electronically Signed    CB/MEDQ  D:  04/15/2007  T:  04/16/2007  Job:  [id number]   cc:   Joni Fears D. Maple Hudson, MD, FCCP, FACP  [name], M.D.

## 2010-09-09 NOTE — Consult Note (Signed)
[name], GRANGE NO.:  [id number]   MEDICAL RECORD NO.:  [id number]          PATIENT TYPE:  INP   LOCATION:  1823                         FACILITY:  [facility]   PHYSICIAN:  Lowella Bandy, MD      DATE OF BIRTH:  1924-10-02   DATE OF CONSULTATION:  05/25/2007  DATE OF DISCHARGE:                                 CONSULTATION   CARDIOLOGIST:  Jonelle Sidle, M.D.   PRIMARY PULMONOLOGIST:  Rennis Chris. Maple Hudson, MD, FCCP, FACP.   PRIMARY CARE PHYSICIAN:  [name], M.D.   REASON FOR CONSULTATION:  Atrial fibrillation with rapid ventricular  response.   HISTORY OF PRESENT ILLNESS:  Ms. [name] is an 77 year old female with  coronary artery disease, atrial fibrillation, and chronic lung disease,  with multiple admissions over the past several weeks with shortness of  breath, who once again reports increasing dyspnea on exertion over the  past couple of weeks which is significantly worsened over the past two  days.  She reports tightness in the upper part of her chest as well as  noting that her heart is racing.  She also reports a cough which feels  like it is coming from her throat.  These symptoms are similar to her  symptoms on previous admissions.  She recently saw Dr. Diona Browner in  clinic and at that time was in fact tachycardic with her atrial  fibrillation and there has been some question as to her compliance with  her Cardizem dosing.  Of note she did have a dobutamine Myoview earlier  this month that was negative for inducible ischemia.  She denies any  fever, chills, or lower extremity edema.   PAST MEDICAL HISTORY:  1. Coronary artery disease, status post coronary artery bypass graft      surgery in 2000.  Ejection fraction was approximately 50% by      echocardiogram in December 2008.  Dobutamine Myoview negative for      inducible ischemia April 29, 2007, which showed mild left      ventricular dysfunction, EF 43%.  2. Chronic atrial fibrillation.   The patient is not on Coumadin      secondary to Von Willebrand's disease.  Of note, the patient will      also not take aspirin.  3. Chronic obstructive pulmonary disease/chronic bronchitis.  4. Diabetes mellitus.  5. Hypertension.  6. Hyperlipidemia.  7. Hypothyroidism.   MEDICATIONS:  The patient is unsure of her medicines.  She reports that  her daughter has her medications but unfortunately her daughter has left  for the evening.  These medications are per Dr. Ival Bible last note.  1. Cardizem 240 mg p.o. b.i.d.  2. Micardis 80 mg p.o. daily.  3. Digoxin 0.125 mg one daily.  4. Niacin 500 mg p.o. once a day.  5. Doxazosin 4 mg p.o. once a day.  6. Lantus 40 units subcutaneously once a day.  7. Omeprazole as needed.  8. Synthroid 200 mcg daily.  9. Home nebulizer treatments.   SOCIAL HISTORY:  The patient is a former smoker but  quit approximately  20 years ago.   FAMILY HISTORY:  Negative for premature coronary artery disease.  Mother  died at age 59 from complications of cancer.  Father died at age 71 with  history of coronary artery disease and COPD.   REVIEW OF SYSTEMS:  Ten systems negative other than as noted above in  the HPI.   PHYSICAL EXAMINATION:  VITAL SIGNS:  Temperature 98.4, blood pressure  138/86, pulse 120, oxygen saturation 96% on 2 AL nasal cannula.  GENERAL:  The patient is not in any apparent respiratory distress.  She  is able to speak easily in full sentences.  HEENT:  Normocephalic, atraumatic.  Extraocular movements intact.  Sclerae nonicteric.  NECK:  Supple.  No masses appreciated.  No carotid bruits.  Jugular  venous pressure approximately 10 cm.  CARDIOVASCULAR:  Irregularly irregular rhythm.  Tachycardic.  No  murmurs, rubs or gallops appreciated.  CHEST:  Rhonchi noted bilaterally, no crackles or frank wheezes.  ABDOMEN:  Soft, nontender, nondistended.  Normal bowel sounds.  EXTREMITIES:  Warm, no edema.  MUSCULOSKELETAL:  Chronic  osteoarthritis changes in both her knees.  No  erythema or effusions.  SKIN: No rashes.  NEUROLOGIC: Alert and oriented x3.  Cranial nerves II-XII grossly  intact.  Moving all extremities well.   Chest x-ray shows chronic changes with mild vascular congestion, no  focal infiltrates.  EKG shows atrial fibrillation at 104 beats per minute, nonspecific ST  abnormalities consistent with digoxin effect.   ASSESSMENT:  77 year old female with coronary artery disease, chronic  atrial fibrillation, chronic lung disease with multiple recent  admissions with recurrent shortness of breath that is likely  multifactorial due to her chronic lung disease, atrial fibrillation with  rapid ventricular response and mild pulmonary vascular congestion.  My  suspicion for acute coronary syndrome is very low, as her initial  enzymes are negative and her EKG abnormalities are most consistent with  her digoxin use.   PLAN:  1. Will attempt approved weight control of her atrial fibrillation      with an IV diltiazem bolus.  If this achieves initial rate control,      then we will resume an oral regimen with 240 mg b.i.d.  If this      does not maintain adequate rate control, she might need a diltiazem      drip.  2. Continue her home medications for her hypertension.  3. As outlined extensively previously, the patient is not on      anticoagulation due to her history of von Willebrand disease.  4. Check TSH to make sure that her thyroid supplementation is not      excessive which might be contributing to the problem.  5. We will check a digoxin level mainly as a measure of her compliance      as there has been some question of compliance in the past.      Lowella Bandy, MD  Electronically Signed    JJC/MEDQ  D:  05/26/2007  T:  05/26/2007  Job:  [id number]

## 2010-09-09 NOTE — Discharge Summary (Signed)
[name], HOLFORD                 ACCOUNT NO.:  [id number]   MEDICAL RECORD NO.:  [id number]          PATIENT TYPE:  INP   LOCATION:  2003                         FACILITY:  [facility]   PHYSICIAN:  [name]. Jens Som, MD, FACCDATE OF BIRTH:  August 23, 1924   DATE OF ADMISSION:  05/25/2008  DATE OF DISCHARGE:  05/31/2008                               DISCHARGE SUMMARY   This patient has allergies to SULFA, AUGMENTIN, BIAXIN, and KEFLEX.  Time for this dictation greater than 55 minutes which includes exam and  discussion with the patient.   FINAL DIAGNOSES:  1. Admitted with nausea, weakness, and a fall which was most probably      syncope.  2. Discharging day 1 status post atrioventricular node ablation.  3. Discharging day 2 status post single-chamber pacemaker      implantation, a [employer] ANTHEM RF [id number] with coronary sinus lead.      a.     There was only 1 posterior vein available.  Unfortunately,       the coronary sinus lead is pacing the RV only.  The QRS is 170       milliseconds.  The rate at the time of discharge is 90 beats per       minute.   SECONDARY DIAGNOSES:  1. Chronic atrial fibrillation, chronic Coumadin.  2. Decline in ejection fraction over the last year.      a.     At echocardiogram in December 2008, ejection fraction 50%.      b.     Echocardiogram on February 17, 2008, ejection fraction 35%.      c.     Echocardiogram on May 30, 2008, ejection fraction 55%,       trivial mitral regurgitation, and mild tricuspid regurgitation.  3. Myoview study in January 2009, no ischemia.  4. History of three-vessel coronary artery disease, status post      coronary artery bypass graft surgery.  5. Diabetes.  6. New York Heart Association class III B congestive heart failure      which has been the subject of several recent admissions.  7. Overtreated hypothyroidism, medication adjusted this admission.  8. Dyslipidemia.  9. Hypertension.  10.von Willebrand disease.  11.Chronic obstructive pulmonary disease.  12.Pernicious anemia.  13.Pulmonary hypertension.  14.History of herniorrhaphy.   PROCEDURES THIS ADMISSION:   DICTATION ENDED AT THIS POINT.      Maple Mirza, PA      [name]. Jens Som, MD, [facility]  Electronically Signed    GM/MEDQ  D:  05/31/2008  T:  05/31/2008  Job:  [id number]   cc:   [name]. Jens Som, MD, [facility]  Doylene Canning. Ladona Ridgel, MD  [name], M.D.

## 2010-09-09 NOTE — H&P (Signed)
[name], [name]                 ACCOUNT NO.:  [id number]   MEDICAL RECORD NO.:  [id number]          PATIENT TYPE:  INP   LOCATION:  3702                         FACILITY:  [facility]   PHYSICIAN:  Barbaraann Share, MD,FCCPDATE OF BIRTH:  [birth year] 02, 1924   DATE OF ADMISSION:  05/25/2007  DATE OF DISCHARGE:                              HISTORY & PHYSICAL   HISTORY OF PRESENT ILLNESS:  The patient is an 77 year old white female  with known history of asthma, atrial fibrillation with diastolic  dysfunction, as well as diabetes, who comes in with a 1-2 day history of  worsening shortness of breath.  The patient has felt chest tightness and  that she is smothering.  Past 2-3 weeks, she has noted progressive  dyspnea to the point that she will get short of breath just walking  through her house.  She admitted a significant upper airway cough that  she herself describes as coming from her larynx.  She has not produced  any mucus.  She has had no fevers, chills or sweats.  In the emergency  room she was noted to have O2 saturations of 97% on 2 liters, as well as  a chest x-ray with mild vascular congestion, but no acute process.  Her  BNP was 121, and her EKG initially showed an atrial fibrillation with a  rate of 108, but was 120-140 during my exam.   PAST MEDICAL HISTORY:  1. Significant for CABG in 2000.  2. History of paroxysmal atrial fibrillation with diastolic      dysfunction.  3. Von Willebrand's disease.  4. History of hypothyroidism.  5. History of diabetes.  6. History of asthma.   CURRENT MEDICATIONS:  As best can tell include:  1. Xopenex nebulizer 1.25 mg t.i.d.  2. Pulmicort nebulizer 0.5 mg b.i.d.  3. Aspirin 81 mg daily.  4. Levothyroxine 200 mcg daily.  5. Lantus 40 units daily.  6. Diltiazem ER 240 in the morning and 180 in the p.m.  7. Prilosec over-the-counter daily.  8. Nitroglycerin p.r.n.  9. Niacin 500 q.h.s.  10.Colace p.r.n.  11.Ambien p.r.n.  12.Mucinex  p.r.n.   INTOLERANCE/ALLERGIES TO:  QUESTIONABLE ASPIRIN, BIAXIN, CARBAMAZEPINE,  CEFAZOLIN, DICYCLOMINE, AND SULFA.   SOCIAL HISTORY:  The patient lives in [place].  She has a long history  of tobacco use but has not done so since 1986.   FAMILY HISTORY:  Remarkable for DVT and coronary disease as well as  cancer.   REVIEW OF SYSTEMS:  As per history of present illness.  Also, see  patient intake form documented in chart.   PHYSICAL EXAMINATION:  GENERAL:  She is a morbidly obese female in  absolutely no acute distress.  VITAL SIGNS:  Blood pressure 130/86, pulse is 120-140 variable rate, O2  saturation on 2 liters 97%.  She is afebrile.  HEENT: Pupils equal and reactive to light and accommodation.  Extraocular movements intact.  Nares are patent without discharge.  Oropharynx is clear.  NECK:  Supple without JVD or lymphadenopathies no palpable thyromegaly.  CHEST:  Totally clear to auscultation.  There is  some mild upper airway  noise with transmission to the bases.  There is no active wheezing  CARDIAC:  Irregular rhythm with a rapid rate.  ABDOMEN:  Soft, nontender with good bowel sounds.  GU/RECTAL/BREASTS:  Not done and not indicated.  LOWER EXTREMITIES:  Without edema.  Pulses intact distally, but  diminished.  NEUROLOGICAL:  She is alert and moves all four extremities.   LABORATORY DATA:  The patient was found to have sodium 138, potassium  4.3, chloride 106, bicarb 25, BUN 17, creatinine 1.0, troponin was  unremarkable.  BNP was 121.  She had a hematocrit of 36 and a hemoglobin  of 12.2.  Chest x-ray shows vascular congestion and cardiac enlargement  but no frank edema or acute process.   IMPRESSION:  Progressive dyspnea in a patient with known heart disease and asthma.  The patient has no exam findings consistent with an asthma exacerbation.  She does have a rapid ventricular response and mild vascular congestion  on chest x-ray.  I also think that her  deconditioning and obesity are  major players to her symptoms of dyspnea.  At this point in time,  because of the patient's chest pressure and rapid ventricular response.  She will need admission to rule out MI and also to get a Cardiology  assistance for rate control.  I will then start her on the basic  medications for asthma for maintenance.  There is nothing to suggest  acute bronchitis, but I will empirically cover her with doxycycline.  She obviously does not need IV steroids.      Barbaraann Share, MD,FCCP  Electronically Signed     KMC/MEDQ  D:  05/26/2007  T:  05/26/2007  Job:  [id number]   cc:   Joni Fears D. Maple Hudson, MD, FCCP, FACP

## 2010-09-09 NOTE — Assessment & Plan Note (Signed)
[facility]                             PULMONARY OFFICE NOTE   NAME:[name], [name] HAGGART                        MRN:          [id number]  DATE:03/14/2007                            DOB:          Jul 29, 1924    PROBLEMS:  1. Asthmatic bronchitis/chronic obstructive pulmonary disease.  2. Musculoskeletal chest wall pain.  3. Coronary disease/bypass/congestive failure (Dr. Diona Browner).  4. Diabetes.  5. Hypothyroid.  6. Gallbladder disease.  7. Von Willebrand's disease.  8. Paroxysmal atrial fibrillation/Coumadin.  9. Pulmonary hypertension.  10.Pernicious anemia.   HISTORY:  Local surgery evaluation and referral evaluation at [facility].  Both turned her down as a surgical candidate for her gallbladder  disease.  From a respiratory standpoint, she said all summer she has had  a congested cough, bringing up white, sticky mucus.  Nothing bloody or  purulent.  There have been no sudden events.  Head has been somewhat  congested, and she has felt more head and chest congestion recently with  the weather change.  Has had flu vaccine.   Her medication list is charted and reviewed.   OBJECTIVE:  Weight 205 pounds.  BP 134/84, pulse 77, room air saturation  94%.  Pulse is somewhat irregular, but I cannot feel the pattern  reliably.  Diminished breath sounds without rales or dullness.  Shallow, consistent  with body habitus.  No neck vein distention or peripheral edema.  No  cyanosis.  No murmur.   She admits that she is out of medication, which likely explains her  increased symptoms.   PLAN:  1. Sample Advair 250/50 but we refilled her prescription at the 100/50      because of costs.  2. Chest x-ray.  3. Xopenex 1.25 mg neb treatment today.  4. DepoMedrol 80 mg IM.  5. Schedule return in 4-6 months, earlier p.r.n.    Clinton D. Maple Hudson, MD, [facility]oll[facility], FACP  Electronically Signed   CDY/MedQ  DD: 03/14/2007  DT: 03/15/2007  Job #: [id number]   cc:   [name],  M.D.

## 2010-09-09 NOTE — Assessment & Plan Note (Signed)
[facility]                            CARDIOLOGY OFFICE NOTE   NAME:[name], [name]                        MRN:          [id number]  DATE:10/12/2006                            DOB:          09/01/1924    PRIMARY CARE PHYSICIAN:  [name], M.D.   PULMONOLOGIST:  Rennis Chris. Maple Hudson, MD, FCCP, FACP   REASON FOR VISIT:  Routine cardiac followup.   HISTORY OF PRESENT ILLNESS:  I saw [name] back in January. She has  multi-vessel coronary disease, status post coronary artery bypass  grafting in 2000, with overall preserved ejection fraction by  echocardiography last July. She has been exercising in her pool. And is  denying any significant angina. She states that her breathing status has  been stable and actually better when she swims rather than when she  exercises in a drier climate. She continues to follow closely with Dr.  Maple Hudson with her history of chronic obstructive pulmonary disease and  asthmatic bronchitis. She had an LDL cholesterol back in February of 146  and unfortunately is not tolerating any statins previously. We talked  about Welchol, which she has at home, but is not taking it yet.   She is in sinus rhythm today by electrocardiogram. The previous question  about her candidacy for gallbladder surgery seems to no longer be an  issue as she is not reporting any active symptoms and is not interested  in pursuing any surgery at this time.   ALLERGIES:  BIAXIN, CEFACLOR, CARBAMAZEPINE, DICYCLOMINE, SULFA DRUGS  AND STATIN INTOLERANCE.   PRESENT MEDICATIONS:  1. Micardis 80 mg p.o. daily.  2. Doxazosin 4 mg p.o. daily.  3. Lantus 40 units subcutaneous daily.  4. Humalog sliding scale.  5. B12 injections.  6. Ambien 10 mg p.o. q nightly.  7. Omega-3 supplement 1000 mg two tablets p.o. daily.  8. Flax seed oil.  9. Cranberry.  10.Multivitamin.  11.Niacin.  12.Synthroid 0.025 mg one and a half tablets p.o. daily.   REVIEW OF  SYSTEMS:  As described in History of Present Illness.   PHYSICAL EXAMINATION:  Blood pressure 138/80, heart rate is 91, weight  is 196 pounds. The patient is comfortable in no acute distress.  Examination of the neck shows no elevated jugular venous pressure,  without bruits. No thyromegaly.  LUNGS:  Are generally clear, diminished breath sounds, but no labored  breathing or wheezing.  CARDIAC: Reveals a regular rate and rhythm. No S3 gallop.  EXTREMITIES: Show no pitting edema.  SKIN: Is tanned.   IMPRESSION:  1. Multi-vessel coronary disease, status post coronary bypass grafting      with preserved ejection fraction. Will plan to continue medical      therapy and symptom observation in the next six months.  2. Prior history of paroxysmal atrial fibrillation. She is not on      Coumadin and has a history of Von Willebrand's disease.     Jonelle Sidle, MD  Electronically Signed    SGM/MedQ  DD: 10/12/2006  DT: 10/12/2006  Job #: [id number]   cc:  [name], M.D.  Clinton D. Maple Hudson, MD, FCCP, FACP

## 2010-09-09 NOTE — Consult Note (Signed)
NAME:  [name], [name]                 ACCOUNT NO.:  [id number]   MEDICAL RECORD NO.:  [id number]          PATIENT TYPE:  INP   LOCATION:                               FACILITY:  [facility]   PHYSICIAN:  Clinton D. Maple Hudson, MD, FCCP, FACPDATE OF BIRTH:  02-09-27   DATE OF CONSULTATION:  08/13/2008  DATE OF DISCHARGE:                                 CONSULTATION   PROBLEM FOR CONSULTATION:  This is an elderly woman known to me for a  long history of recurrent asthmatic bronchitis complaints.  She has now  presented with lower GI bleed and is anticipating colonoscopy.  Pulmonary consultation was requested to look at stability with  impression that her wheeze is getting worse.  History associated with  the admission is reviewed on the chart.  She tells me that she has  experienced increase in her minimal baseline cough and wheeze since she  has been in the hospital, especially in the last 24 hours.  Cough  produces very scant amounts of mostly clear sputum described partly as  plugs like rice indicating small airway plugs.  She is not particularly  uncomfortable but was concerned about stability through colonoscopy.   Medications were reviewed noting that Solu-Medrol was just started.   PAST HISTORY:  1. Pernicious anemia.  2. Von Willebrand disease.  3. Paroxysmal atrial fibrillation.  4. Placement of a pacemaker in recent weeks.  5. Recurrent congestive heart failure, mostly left ventricular.  6. Diabetes mellitus.  7. Coronary artery disease with history of coronary bypass graft.  8. Chronic atypical chest wall pains.   Surgical history included coronary bypass graft, mandibular  reconstruction with bone graft from ribs, appendectomy, and hernia  repair (June 17, 2007).   SOCIAL HISTORY:  She had been living with her daughter.  Former smoker.   Pulmonary function test as of May 17, 2006, had recorded an FVC of  2.73 (104% predicted), FEV-1 of 1.65 (92% predicted) FEV-1/FVC  ratio of  0.60.  Obstructive changes in small airways with an FEF 25-75% of 0.66 L  (33% of predicted) with a 37% improvement after bronchodilator at that  level.  Lung volumes were normal with a total lung capacity 107% and  diffusion capacity mildly reduced at 68% of predicted.   FAMILY HISTORY:  Noncontributory at her age.   OBJECTIVE:  VITAL SIGNS:  BP 134/84, pulse regular 86, temperature 97.8,  room air saturation 93%, respiratory rate is 18.  GENERAL APPEARANCE:  Vivacious, outspoken woman.  I could easily hear  her talking animated immediately over her telephone from where I am  sitting outside of her room.  Obese elderly woman not obviously  uncomfortable.  SKIN:  Warm and dry.  ADENOPATHY:  None felt at the neck, supraclavicular, or axillary areas.  HEENT:  Oral mucosa is clear.  Nasal airway is unobstructed.  There is  no stridor and no neck vein distention.  Speech is clear and she is  speaking in long sentences.  CHEST:  Diffuse mild inspiratory and expiratory bilateral rather coarse  wheeze.  I cannot distinguish rales,  and I do not hear dullness or rub  allowing for body habitus.  Heart sounds are regular without murmur or  gallop audible.  There is a healed sternotomy scar.  ABDOMEN:  Softly obese.  I do not feel liver or spleen.  EXTREMITIES:  No cyanosis, clubbing, or edema.  BREAST, PELVIC, AND RECTAL:  Not examined.  Not pertinent.   LABORATORY DATA:  I have reviewed her x-rays.  There is chronic blunting  of the right costophrenic angle consistent with scarring.  She now has a  pacemaker projecting over the left chest.  I do not see obvious  infiltrate, edema, or active process.  Admission B-natriuretic peptide  was elevated at 568.  Hemoglobin 12.5, WBC 7000.   IMPRESSION:  1. She has mild obstructive airways disease with a reactive component,      primarily in small airways.  There may be some exacerbation now.  I      have reviewed her medications noting  that she just started Solu-      Medrol at 60 mg b.i.d.  She is already on Avelox without purulent      sputum or fever.  I can only suggest from a reactive airways      consideration that Solu-Medrol be increased to 80 mg b.i.d., but it      will take a few days to work.  2. Whenever she has had a respiratory exacerbation, there has usually      been a component of interstitial edema even if not immediately      available.  The admission B-natriuretic peptide of 568 is      suggestive and ongoing efforts should be made to keep her little on      the dry side.  She is not anemic enough that I would anticipate a      high-flow congestive heart failure at this point.  Her lack of      distress and loud active speech on telephone again is commented      upon as indicator that she still has considerable comfort reserve      in her respiratory component.  Depending on the urgency of      colonoscopy, I would suggest that she be kept as dry as you are      comfortable with while maintaining intravenous steroids      anticipating that reversible aspects of her lung disease will have      been addressed in the next day or two.      Clinton D. Maple Hudson, MD, Tonny Bollman, FACP  Electronically Signed     CDY/MEDQ  D:  08/13/2008  T:  08/14/2008  Job:  [id number]

## 2010-09-09 NOTE — Assessment & Plan Note (Signed)
[facility]                            CARDIOLOGY OFFICE NOTE   NAME:[name], [name]                        MRN:          [id number]  DATE:05/16/2007                            DOB:          Oct 07, 1924    REASON FOR VISIT:  Follow-up atrial fibrillation.   HISTORY OF PRESENT ILLNESS:  [name] comes in for routine visit.  She  was in the hospital over the last several weeks with a bronchitis flare  and rapid atrial fibrillation.  We up-titrated her Cardizem during that  stay and her dose was supposed to be Cardizem CD 240 mg p.o. b.i.d. most  recently, although she seems to be confused about this.  As best I can  ascertain she is taking Cardizem CD 240 mg in the morning and 180 mg in  the evening.  She continues to report a feeling of palpitations at  times, and her heart rate today is 115 in atrial fibrillation, although  she states quite clear that she did not take her Cardizem this morning.  I spoke with her about being compliant and taking her medicines on a  regular basis.  We have not pursued cardioversion of either electrical  or chemical given her history of 5  Von Willebrand's disease and  limiting our ability to use Coumadin.  I have explained this to her on a  number of occasions.  During her recent stay, she did undergo a Myoview,  which was low risk and ruled out for myocardial infarction.  She is not  reporting any classic angina.   ALLERGIES:  BIAXIN, CEPHALEXIN, CARBAMAZEPINE, DICYCLOMINE, SULFA DRUGS,  and AUGMENTIN.   PRESENT MEDICATIONS:  Micardis 80 mg p.o. daily, doxazosin 4 mg p.o.  daily.  Lantus 40 units daily.  Humalog 10 units as directed, vitamin  B12, Ambien 10 mg p.o. nightly. Omeprazole 20 mg p.o. p.r.n. Synthroid  0.2 mg and 0.5 mg taken once daily, prednisone taper, glipizide 10 mg  p.o. daily.  Digoxin 0.125 mg p.o. daily niacin 500 mg p.o. daily.  Diltiazem CD 240 mg p.o. q. a.m. at 180 mg p.o. q. p.m.   REVIEW  OF SYSTEMS:  Described present illness.  No cough or hemoptysis.  No fevers, chills.  She is complaining of weakness in her legs.  She  states that this is a neuropathy.Marland Kitchen   EXAMINATION:  Blood pressure 155/82, heart rate 100-115 atrial  fibrillation.  The patient is in no acute distress.  HEENT:  Conjunctivae was normal.  Pharynx clear.  NECK:  Supple.  No elevated venous pressure.  LUNGS:  Clear diminished breath sounds.  No active wheezing present.  CARDIAC:  Irregularly irregular rhythm.  No S3 gallop.  ABDOMEN:  Soft, nontender.  Extremities have no pitting edema.  Distal pulse of 1 to 2+  SKIN:  Warm, dry.  Musculoskeletal no kyphosis.  Neuropsychiatric the patient alert and oriented x3.   12-lead echocardiogram today shows atrial fibrillation at 115 beats per  minute with nonspecific ST-T wave changes.   IMPRESSION:  1. Atrial fibrillation, poor control today.  The patient  states that      she did not take her Cardizem CD this morning which I expect      contributes to this increased rate.  She may also not be taking the      previously prescribed dose.  I reviewed this with her again today      in detail.  We are providing a new prescription an asking her to      discard the old ones if any remain at home.  She should be taking      Cardizem CD 240 mg p.o. b.i.d. at this point.  She had good heart      rate control noted on a similar regimen the hospital under observed      conditions.  She is not on Coumadin given Von Willebrand's disease      and we have no plans for cardioversion attempts.  I will plan to      see her back over the next 3 months.  2. Coronary disease status post coronary bypass grafting with normal      ejection fraction.  The patient underwent an inpatient Myoview      during her most recent hospital stay that was overall low risk.  Do      not anticipate any other ischemic evaluation at this time.     Jonelle Sidle, MD  Electronically  Signed    SGM/MedQ  DD: 05/16/2007  DT: 05/16/2007  Job #: [id number]   cc:   Joni Fears D. Maple Hudson, MD, FCCP, FACP

## 2010-09-09 NOTE — Assessment & Plan Note (Signed)
[facility]                            CARDIOLOGY OFFICE NOTE   NAME:FORBESMarlissa, [name]                        MRN:          [id number]  DATE:08/08/2007                            DOB:          1925/01/26    PULMONOLOGIST:  [name] Fears D. Maple Hudson, MD, FCCP, FACP   PRIMARY CARE PHYSICIAN:  [name], M.D.   REASON FOR VISIT:  Followup atrial fibrillation.   HISTORY OF PRESENT ILLNESS:  I saw [name] in January.  She  comes into the office again today for a followup with uncontrolled  atrial fibrillation.  Just like last visit, she states after questioning  that she did not take her medications this morning.  When taking her  Cardizem CD 240 mg p.o. b.i.d. and digoxin at 125 mcg p.o. daily, she  has had a controlled heart rate, and this has been directly deserved  during hospitalizations.  The patient was here with her daughter today,  and I spoke with them both about the critical importance of medication  compliance in her case.  I think this situation should be less thought  of as a problem with suboptimal control of atrial fibrillation due to  ineffectiveness of medicines as much as it is a problem with medication  noncompliance.  It is for this reason that I have not pushed for  considerations such as AV nodal ablation and pacemaker therapy.  She is  not reporting any specific chest pain and continues to have dyspnea on  exertion at baseline with flares of her chronic asthmatic bronchitis.  She is not on anticoagulation given known Von Willebrand's disease.  Electrocardiogram today showed atrial fibrillation up to 140, although  it is lower being seated for several minutes down around 110 beats a  minute.  She has diffuse nonspecific ST-T wave changes and no marked  change compared to a prior tracing.   ALLERGIES:  BIAXIN, CEPHALEXIN, CARBAMAZEPINE, DICYCLOMINE, SULFA DRUGS,  AND AUGMENTIN.   HOME MEDICATIONS:  Home medical regimen is supposed  to include:  1. Micardis 80 mg p.o. daily.  2. Doxazosin 4 mg p.o. daily.  3. Lantus 40 units daily.  4. Humalog 10 units as directed.  5. Vitamin B12.  6. Synthroid 0.7 mg p.o. daily.  7. Glipizide 10 mg p.o. daily.  8. Digoxin 0.125 mg p.o. daily.  9. Diltiazem CD 240 mg p.o. b.i.d.  10.Ambien p.r.n.   REVIEW OF SYSTEMS:  As described in the history of present illness.   PHYSICAL EXAMINATION:  VITAL SIGNS:  Blood pressure is 120/90, heart  rate 110 and irregular at rest.  GENERAL:  The patient is in no acute distress.  NECK:  No elevated jugular venous pressure, no loud bruits.  LUNGS:  Diminished breath sounds.  No labored breathing.  CARDIAC:  Irregularly irregular rhythm.  No S3 gallop.  EXTREMITIES:  No frank pitting edema.   IMPRESSION AND RECOMMENDATIONS:  1. Permanent atrial fibrillation, rate not adequately controlled today      due to noncompliance with medications.  I once again stressed the  critical importance of compliance with her rate control regiment,      as it has already been demonstrated to be effective when she takes      these medicines regularly.  She is not on Coumadin, given Von      Willebrand's disease.  We gave her a dose of short-acting Cardizem      60 mg in the office, and I asked her to go ahead and take her long-      acting dose when she gets home.  Otherwise, I will plan to see her      [name] over the next 6 months.  2. Known coronary artery disease status post coronary artery bypass      grafting.  She has had a followup Myoview within the last year      demonstrating no frank ischemia and an ejection fraction in the      normal range.     [name] Sidle, MD  Electronically Signed    SGM/MedQ  DD: 08/08/2007  DT: 08/08/2007  Job #: [id number]   cc:   [name] Fears D. Maple Hudson, MD, FCCP, FACP  [name], M.D.

## 2010-09-09 NOTE — Discharge Summary (Signed)
NAMEPEARSON, REASONS                 ACCOUNT NO.:  [id number]   MEDICAL RECORD NO.:  [id number]          PATIENT TYPE:  INP   LOCATION:  2003                         FACILITY:  [facility]   PHYSICIAN:  Doylene Canning. Ladona Ridgel, MD    DATE OF BIRTH:  10-Nov-1924   DATE OF ADMISSION:  05/25/2008  DATE OF DISCHARGE:  05/31/2008                               DISCHARGE SUMMARY   The patient has allergy to SULFA, AUGMENTIN, BIAXIN and KEFLEX.   TIME FOR THIS DICTATION EXAM AND EXPLANATIONS TO THE PATIENT:  Greater  than 55 minutes.   FINAL DIAGNOSES:  1. Admitted with nausea, weakness, and fall, the fall most certainly a      syncope.  She had a laceration to the right forehead.  2. Discharging day 1 status post atrioventricular node ablation.  3. Discharging day 2 status post implantation of single chamber      pacemaker, a [employer] Anthem RF [id number] with coronary sinus lead.      a.     Coronary sinus lead is pacing, the RV only at this point,       QRS is 170 msec, the rate at present is 90 beats per minute.   SECONDARY DIAGNOSES:  1. Chronic atrial fibrillation, chronic Coumadin.  2. Decline in ejection fraction over the past year, ejection fraction      in December 2008 was 50%.  Ejection fraction at echocardiogram on      February 17, 2008, 35%.  However, ejection fraction at      echocardiogram on May 30, 2008, 55% with trivial mitral      regurgitation and mild tricuspid regurgitation.  3. Myoview study, negative for ischemia.  4. Three-vessel coronary artery disease, status post coronary artery      bypass graft surgery.  5. Diabetes.  6. New York Heart Association Class III D congestive heart failure      with several admissions recently for acute-on-chronic      decompensation.  7. Overtreated hypothyroidism.  Synthroid decreased in dosage from 250      mcg to 200 mcg this admission.  8. Dyslipidemia.  9. Hypertension.  10.Von Willebrand disease.  11.Chronic obstructive pulmonary  disease.  12.Pernicious anemia.  13.Pulmonary hypertension.  14.History of herniorrhaphy.   PROCEDURES THIS ADMISSION:  1. May 29, 2008, implant of a single chamber [employer] Jude Anthem RF      single chamber pacemaker.  The patient is VVI at 90 beats per      minute coming out of the EP lab.  2. May 30, 2008, atrioventricular node ablation, Dr. Johney Frame.  The      patient is successfully pacing consistently at a rate of 90 V      pacing.  3. A 2-D echocardiogram on May 30, 2008, ejection fraction 55%,      trivial mitral regurgitation, and mild tricuspid regurgitation.   BRIEF HISTORY:  On admission, May 25, 2008, the patient presents  with weakness, orthostasis, and status post a fall.  The fall was most  certainly a syncopal event.  The patient has  a 1-inch laceration at the  right forehead, which has been sutured in the emergency room.   This is an 77 year old on female.  She was seen by Dr. Antoine Poche on  May 09, 2008, for a complaint of nausea and weakness.  She was  offered admission at that time, but refused.  She reports about a 2-  month history of nausea and weakness.  She was hospitalized in December  and then again in January.   Because of ongoing nausea, Dr. Lucianne Muss initiated the medication.  She is  not sure of this name, but after taking it, she became disoriented and  she went to [facility].  She does not recollect the details  surrounding the hospitalization or even if she were admitted.  She has  been home for the hospital about 1 week and has continued with nausea  and weakness.   She tells Dr. Lucianne Muss, the afternoon of May 24, 2008, complaining of  dizziness.  She was told to increase her sodium intake.  On the evening  of this admission, May 25, 2008, she woke up in the middle of the  night.  She was confused, not sure where she was.  She was not sure how  she got out of bed, but apparently did have a fall.  She does she does  not  remember if she was dizzy or lightheaded prior to the fall.  She is  very certain she did not lose consciousness.  Her daughter is living  with her and took her to [facility].  Laceration of right  forehead has been sutured.  CT of the head and thoracic spine were  performed and they are normal.  She complains of pain going on the right  back.  She had lab work at [facility] showing no acute findings.  She has  to be transferred here, otherwise, no other complaints.   HOSPITAL COURSE:  The patient admitted with nausea, weakness, and a fall  very probably a syncopal fall.  On May 25, 2008, her cardiac enzymes  were 0.01 and 0.01.  The patient is not complaining of chest pain at  all.  She has a history of ischemic cardiomyopathy with a decreased  ejection fraction and on questioning says that she is having trouble  getting from even room to room without getting short of breath,  indicating that perhaps she has advancement of her chronic systolic  congestive heart failure symptoms.  Her BNP on admission was 125,  however.  The patient has been in chronic atrial fibrillation.  She is  maintained on Coumadin therapy.  Orthostatic blood pressure measurements  showed that she is not particularly orthostatic on admission.  She was  initiated to an Electrophysiology consult.  She was seen initially by  Dr. Sherryl Manges, a syncopal fall without memory suggests carotid  hypersensitivity.  The patient does have some lightheadedness with head  turning and supports the diagnosis.  The patient remains with  symptomatic atrial fibrillation.  She has palpitations and this perhaps  maybe contributing to her to an ejection fraction decline.  Options  included tilt table, AV node ablation with implantation of pacemaker  with cardiac resynchronization therapy or ICD with cardiac  resynchronization therapy or maybe an ICD implanted by itself.  Telemetry has shown that the patient does have  intermittent bundle-  branch block at times with her atrial fibrillation.  The ultimate  decision was made that the patient would receive a BiV pacemaker with  AV  node ablation.  The patient understands the risks and benefits of that  procedure and wishes to proceed.  This was done on May 29, 2008, by  Dr. Ladona Ridgel.  The coronary sinus was catheterized, however, a posterior  vein was the only suitable vein at midportion, she had diaphragmatic  stimulation.  The lead had to be retracted and eventually exhibited a  stable threshold with no diaphragmatic stimulation.  AV node ablation  was postponed until stable left ventricular lead placement could be  ascertained.  The following day, May 30, 2008, the device was  interrogated, thresholds had actually gotten better from 3-1.7.  The  patient was taken that afternoon to the electrophysiology lab and had AV  node ablation.  It was noted during that procedure that the coronary  sinus lead was pacing the right ventricle only.  The only possible  future course would be an epicardial implantation on the left ventricle  if the patient persisted to be symptomatic despite AV node ablation and  V pacing in the right ventricle.  The patient has had some significant  pain after the procedure.  She has been maintained on IV fentanyl, but  now switching to [facility] on May 31, 2008, prior to discharge, the  patient says that at home both her daughter and husband have the  influenza, and it is questionable whether she will actually discharge on  May 31, 2008.  I would note and passing that prior to implantation  of the pacemaker, the patient did receive a calculated amount of DDAVP  for her von Willebrand syndrome disease.  After AV node ablation, the  patient's digoxin was stopped.  Her metoprolol XL was decreased from 100  mg daily to 50 mg daily.  The patient has been instructed to keep her  arm quiet for the next 4 days and to sponge bathe  until Tuesday,  June 05, 2008, keep the incision dry for one whole week.  She is  asked not to drive for 1 week.   MEDICATIONS AT DISCHARGE:  1. Synthroid 200 mcg daily, a new dose.  2. Lasix 20 mg daily, a new dose down from 40 mg daily.  3. Crestor 10 mg daily.  4. Metoprolol XL 50 mg daily, a new dose down from 100 mg daily.  5. Potassium chloride 10 mEq daily.  6. Benicar 5 mg daily, a new dose down from 10 mg daily.  7. Vitamin B12 1000 mcg injected subcutaneously twice monthly.  8. Lovaza 1000 mg 4 times daily.  9. Lantus 46 units injected subcutaneously daily.  10.Niaspan 500 mg at bedtime.  11.Coumadin 5 mg tablets 1 tablet daily except one to one-half tablets      Monday and Friday.  12.Humalog 12 units before meals and sliding scale as needed.   The patient is asked to stop taking digoxin altogether.   She has followup at [facility] [address]  1. Coumadin Clinic Tuesday, June 05, 2008, at 9:30.  2. Pacer Clinic Wednesday, June 13, 2008, at 9:20.  She will have      a basic metabolic panel at this admission.  3. To see Dr. Jens Som Tuesday, June 26, 2008 at 9:30.  He will want      to know how the patient is doing as far as her exercise tolerance.  4. To see Dr. Ladona Ridgel in May, 2010, his office will call with that      appointment.  5. She will  also see Dr. Lucianne Muss Friday, June 08, 2008, 9:45 with      regard to a TSH on this admission of 0.080.  In addition, Home      Health will see her after she gets home.  They will remove the      forehead sutures on Wednesday, June 06, 2008.   LABORATORY STUDIES:  This admission, May 29, 2008, hemoglobin 13.3,  hematocrit 39.3, white cells 5.8, and platelets of 189.  Serum  electrolytes the same day, sodium 139, potassium 4.4, chloride 100,  carbonate 27, BUN is 10, creatinine 0.87, and glucose 226.  Protime on  the day of discharge 23.4 and INR is 2.0.  A list of the dosing and  protimes  this admission is faxed to [facility].  Alkaline  phosphatase this admission 85, SGOT  22, and SGPT is 17.  Troponin I studies 0.01 and 0.01.  Total  cholesterol was 100, LDL cholesterol 38, HDL cholesterol 32, and  triglycerides 150.  Magnesium was 1.7 this admission.  The BNP on  admission on May 25, 2008, 125.  TSH once again 0.080, this is a  thyroid-stimulating hormone value which is low.      Maple Mirza, PA      Doylene Canning. Ladona Ridgel, MD  Electronically Signed    GM/MEDQ  D:  05/31/2008  T:  05/31/2008  Job:  [id number]   cc:   [name], M.D.  Doylene Canning. Ladona Ridgel, MD

## 2010-09-09 NOTE — Discharge Summary (Signed)
[name], [name]                 ACCOUNT NO.:  [id number]   MEDICAL RECORD NO.:  [id number]          PATIENT TYPE:  INP   LOCATION:  2027                         FACILITY:  [facility]   PHYSICIAN:  Luis Abed, MD, FACCDATE OF BIRTH:  April 27, 1925   DATE OF ADMISSION:  02/24/2008  DATE OF DISCHARGE:  02/28/2008                               DISCHARGE SUMMARY   PRIMARY CARDIOLOGIST:  [name]. Jens Som, MD, [facility]   PRIMARY CARE PHYSICIAN:  Not listed.   PROCEDURES PERFORMED DURING HOSPITALIZATION:  None.   FINAL DISCHARGE DIAGNOSES:  1. Atrial fibrillation with rapid ventricular response.  2. Medical noncompliance.  3. Von Willebrand disease.  4. Hypothyroidism.  5. Ischemic cardiomyopathy, status post coronary artery bypass      grafting in 2000.      a.     Left internal mammary artery to left anterior descending,       saphenous vein graft to first diagonal and sequential saphenous       vein to first obtuse marginal artery and second obtuse marginal       artery with also an saphenous vein graft to the distal right       coronary artery.      b.     Status post Myoview stress test in January 2009 with an       ejection fraction of 43%, but no ischemia.      c.     Recent echocardiogram revealing an ejection fraction of 35%       with hypokinesis, akinesis, and inferior septal walls on February 17, 2008.   HOSPITAL COURSE:  This is an 77 year old female who was recently seen by  Dr. Olga Millers in his office on February 10, 2008, with above-  mentioned diagnoses.  The patient had history of medical noncompliance  and was scheduled to have a repeat of her echocardiogram.  The patient  did have this completed, but she also had complaints of some shortness  of breath and rapid heart rhythm.  As a result of this, the patient was  admitted secondary to atrial fib, RVR with a ventricular rate of 137  beats per minute at rest.  However, in the office, the monitor showed  rates as high as 170.  The patient's medications were adjusted to slow  down the heart rate and it was found that the patient's heart rate was  decreased significantly on medications that were provided and at one  point ends up with some junctional bradycardia.  As a result of this, a  question of medical compliance at home was raised by Dr. Nona Dell  while he saw her over the weekend of her admission.  The patient's  medication doses were readjusted again, and the patient's digoxin was  discontinued along with her Cardizem.  She was also on doxazosin, which  was discontinued, and she was just placed on metoprolol 25 mg b.i.d.,  which was increased from 12.5 mg b.i.d. and she was started on Benicar  10 mg daily.  All other cardiac  medications were discontinued.   The patient's heart rate and rhythm normalized.  Her blood pressure  remained stable as it was mildly diminished during the weekend with  medications adjusted.  The patient was found not to be a Coumadin  candidate secondary to von Willebrand disease.   The patient was seen and examined by Dr. Willa Rough on February 28, 2008, and found to be stable.  The patient's digoxin was remained  discontinued along with her Cardizem.  She did have some mild  hypokalemia and was given a dose of potassium prior to dismissal.  She  will follow up in the office with Dr. Jens Som on a previously scheduled  appointment on March 09, 2008, at 2:15 p.m.   DISCHARGE LABORATORIES:  Digoxin level less than 0.2, sodium 141,  potassium 3.6, chloride 101, CO2 of 33, glucose 79, BUN 16, and  creatinine 0.95.  Hemoglobin 13.8, hematocrit 41.4, white blood cells  7.6, and platelets 263.  The patient's TSH was 2.429, cholesterol 203,  triglycerides 151, HDL 48, and LDL 125.  BNP on admission was 397.  Chest x-ray dated February 24, 2008, revealed cardiac enlargement and  pulmonary venous congestion, right base scarring versus effusion.   Echocardiogram dated February 17, 2008, prior to admission revealed  difficult acoustic  windows make evaluation difficult.  Overall, LVEF is  depressed at approximately 35% with hypokinesis and akinesis of the  inferior and septal wall hypokinesis elsewhere.   VITAL SIGNS ON DISCHARGE:  Blood pressure 102/63, pulse 69, respirations  18, temperature 96.8, O2 sat 97% on room air, and the patient's weight  was 89.5 kg (admission weight 92.5 kg).   EKG on discharge revealing atrial fibrillation with a ventricular rate  of 65 beats per minute.   DISCHARGE MEDICATIONS:  The patient has been advised to stop Cardizem,  doxazosin, and digoxin as listed on her home medication list.   1. Fish oil daily.  2. Flaxseed oil daily.  3. __________ 500 mg daily.  4. Lutein daily.  5. Multivitamin daily.  6. Synthroid 225 mcg daily.  7. Lantus insulin 40 units subcu daily.  8. Humalog 10 units at bedtime.  9. Humalog sliding scale with meals.  10.B12 injection every 2 weeks.  11.Metoprolol 25 mg b.i.d. (increased from 12.5 mg b.i.d.).  12.Lasix 20 mg daily.  13.Niacin 250 mg 2 tablets daily.  14.Calcium daily.  15.Ambien 10 mg p.r.n. insomnia.  16.Albuterol inhaler p.r.n.  17.Tylenol p.r.n.  18.Pulmicort p.r.n.  19.Benicar 10 mg daily (new prescription provided).   ALLERGIES:  ATORVASTATIN, VALDECOXIB, LABETALOL, MACROLIDES,  DICYCLOMINE, TEGRETOL, CEPHALOSPORINS, SULFA, TRILEPTAL, AMOXICILLIN,  __________.   FOLLOWUP PLANS AND APPOINTMENT:  1. The patient is scheduled for an appointment with Dr. Olga Millers      on March 09, 2008, on a previously scheduled appointment at 1:45      p.m.  2. The patient has been advised on medications, which she is      discontinuing to include digoxin, Cardizem, and doxazosin.  3. The patient has been advised on new medications, increased dose of      metoprolol to 25 mg b.i.d. and Benicar 10 mg daily.  4. The patient has been reinforced on medical  compliance.  5. The patient is to follow up with her primary care physician for      continued medical management.   TIME SPENT WITH THE PATIENT TO INCLUDE PHYSICIAN TIME:  Forty minutes.       Bettey Mare. Lyman Bishop,  NP      Luis Abed, MD, [facility]  Electronically Signed    KML/MEDQ  D:  02/28/2008  T:  02/28/2008  Job:  [id number]

## 2010-09-09 NOTE — Discharge Summary (Signed)
[name], [name]                 ACCOUNT NO.:  [id number]   MEDICAL RECORD NO.:  [id number]          PATIENT TYPE:  INP   LOCATION:  3712                         FACILITY:  [facility]   PHYSICIAN:  [name]. Jens Som, MD, FACCDATE OF BIRTH:  08-24-1924   DATE OF ADMISSION:  04/03/2008  DATE OF DISCHARGE:  04/10/2008                               DISCHARGE SUMMARY   PRIMARY CARDIOLOGIST:  [name]. Jens Som, MD, [facility].   PRIMARY CARE PHYSICIAN:  Not listed.   CONSULTING PHYSICIAN:  Deanna Artis. Sharene Skeans, M.D.   PROCEDURES PERFORMED DURING HOSPITALIZATION:  None.   FINAL DISCHARGE DIAGNOSES:  1. Atrial fibrillation.  2. Left atrial appendage clot.  3. Anemia.  4. Coronary artery disease, status post coronary artery bypass graft.  5. Ischemic cardiomyopathy.  6. Diabetes.  7. Hypertension.  8. Medical noncompliance.  9. Urinary tract infection  10.Ovarian cyst.   HOSPITAL COURSE:  This is an 77 year old Caucasian female with known  history of ischemic cardiomyopathy, atrial fibrillation with left atrial  appendage clot, and CABG in 2000 with LIMA to LAD, SVG to OM and OM-2,  and SVG to RCA, who presented to [facility] secondary to  increasing dementia and noncompliance with medications.  The patient was  found to be in mild congestive heart failure on admission.  The patient  was seen and examined by Dr. Vernice Jefferson, Cardiology fellow for Dr. Olga Millers.  The patient was given Lasix, Lovenox, and restarted home  medications.  The patient was followed closely with Coumadin and Lovenox  and the patient's Neurontin was discontinued on admission.   Dr. Ellison Carwin was also consulted secondary to acute dementia.  The patient had a CT scan of the brain, which was reviewed showing no  chronic ischemic changes, known atrophy, but no other change.  He had no  further recommendations other than to discontinue Neurontin and observe  over 1 week.  The patient slowly  improved and confusion did get better.  The patient's Neurontin will remain discontinued.   The patient was followed throughout the next several days to monitor  PT/INR and watch for bleeding or hematuria.  The patient's daughter  requested that the patient have CNA come to visit her during the week to  make sure medications were taken and were divided out appropriately for  her to take each day.  The patient was also diagnosed with UTI and began  on Cipro for 5 days.   The patient had some mild hypokalemia during hospitalization and was  repleted with p.o. potassium, which she will return home on 10 mEq a  day.  It was also noted that the patient had an ovarian cyst and it was  requested by Dr. Jens Som that the patient follow up with OB/GYN.  The  patient states that she would follow up on her own accord and did not  wish for [place] to make an appointment for her.  Her daughter stated that  she wished her to see OB/GYN in [place], but the patient requested to  be seen by one in [place],  but she stated that she would make those  arrangements and did not want our help concerning this.   The patient will be discharged home.  She will follow up in the Coumadin  Clinic on Thursday, April 12, 2008, for Coumadin adjustment.  Her INR  on discharge is 2.4.  The patient will also need to follow up to check  lipids and LFTs within 6 weeks post discharge.  The patient also will  follow up with Dr. Olga Millers in approximately 2-4 weeks.  In the  interim, the patient has been started on Crestor.  A prescription has  been written for potassium and for digoxin as the patient did not have a  dose at home.  She has been advised not to take Neurontin and will have  Coumadin adjusted as an outpatient.   DISCHARGE VITAL SIGNS:  Blood pressure 126/72, heart rate 75,  temperature 97.9, and O2 sat 94% on room air.   Sodium 144, potassium 3.4, chloride 103, CO2 34, BUN 13, creatinine 0.9,  and  glucose 143.  Hemoglobin 13.4, hematocrit 39.3, white blood cells 6,  and platelets 236.   DISCHARGE MEDICATIONS:  1. Coumadin 5 mg daily except for Mondays and Fridays she will be      taking 2.5 mg.  2. Crestor 10 mg daily.  3. Potassium 10 mEq daily.  4. Digoxin 0.125 mg daily.  5. Synthroid 225 mcg daily.  6. Lasix 20 mg daily.  7. Niacin 500 mg daily.  8. Benicar 5 mg daily.  9. Digoxin 0.125 mg daily.  10.Insulin per sliding scale.  11.Lantus insulin 40 units subcu daily.  12.Multivitamin daily.  13.B12 shots twice a month.  14.Calcium 600 mg daily.  15.Fish oil daily.  16.Toprol-XL 100 mg daily.   ALLERGIES:  ATORVASTATIN, VALDECOXIB, LABETALOL, DICYCLOMINE, TEGRETOL,  CEPHALOSPORIN, SULFA, TRILEPTAL, and AUGMENTIN.   FOLLOWUP PLANS AND APPOINTMENTS:  1. The patient will follow up in the Coumadin Clinic on Thursday,      April 12, 2008, at 11:45 a.m.  2. The patient will follow up with Dr. Olga Millers on April 30, 2008, at 2:45 p.m.  3. The patient will need to have lipids and LFTs checked within 6      weeks post discharge.  4. The patient has refused our referral to OB/GYN and she states she      wishes to make that appointment on her own.  5. The patient has been advised on new medications Crestor daily,      potassium, and Coumadin dosing.  6. The patient will have CNA see the patient approximately 3 times a      week to evaluate medications and make sure that she is taking them      and to follow her concerning any questions.   TIME SPENT WITH THE PATIENT TO INCLUDE PHYSICIAN TIME:  45 minutes.       Bettey Mare. Lyman Bishop, NP      [name]. Jens Som, MD, [facility]  Electronically Signed    KML/MEDQ  D:  04/10/2008  T:  04/10/2008  Job:  [id number]

## 2010-09-09 NOTE — Consult Note (Signed)
[name], [name]                 ACCOUNT NO.:  [id number]   MEDICAL RECORD NO.:  [id number]          PATIENT TYPE:  INP   LOCATION:                               FACILITY:  [facility]   PHYSICIAN:  Valetta Fuller, M.D.  DATE OF BIRTH:  12/24/1924   DATE OF CONSULTATION:  03/06/2008  DATE OF DISCHARGE:                                 CONSULTATION   REASON FOR CONSULTATION:  Gross painless hematuria.   HISTORY OF PRESENT ILLNESS:  Ms. Hubner is an 77 year old female.  She  denies any prior urologic history.  She has had no previous urologic  dysfunction, significant voiding symptoms, or hematuria.  The patient  was admitted to the hospital on February 29, 2008.  Her primary problem  has been some issues of shortness of breath and nausea.  She was  initially thought to potentially have a pulmonary embolus and CT  angiogram of the chest apparently was negative for pulmonary embolus.  She did, however, have what appear to be some thrombus in her left  atrial appendage.  The patient was started on heparin.  She developed  some gross hematuria which was not associated with any dysuria or any  other marked change in voiding patterns.  The patient's hematuria at  this point has actually resolved and the last several voids have been  clear.  Urinalysis has confirmed the hematuria and urine culture was  negative.  Her hemoglobin is normal at 13.3 and overall systemic renal  function is normal as well.   Complicating factors include an apparent diagnosis of von Willebrand  disease.  She has been seen by Dr. Arline Asp from Hematology/Oncology and  I have reviewed his note with regard to that situation.  She is to  undergo additional assessment with regard to coag issues/bleeding  factors.  The patient denies any previous history of nephrolithiasis.  She is a nonsmoker.   PAST MEDICAL HISTORY:  1. Atrial fibrillation.  2. Coronary artery disease, status post CABG.  3. Ischemic cardiomyopathy.  4. Von Willebrand disease.  5. Hypothyroidism.  6. Hyperlipidemia.   SURGICAL HISTORY:  1. Coronary artery bypass surgery.  2. Hernia repair.   MEDICATIONS ON ADMISSION:  Fish oil, multivitamin, Synthroid, calcium,  terazosin, insulin, digoxin, metoprolol, Lasix, Cardizem, and niacin.   She has allergies and reactions to LATEX, SULFA, PENICILLIN, KEFLEX, and  some NONSTEROIDAL ANTI-INFLAMMATORIES.  The patient again is a  nonsmoker.   REVIEW OF SYSTEMS:  Positive for some easy bruising as well as shortness  of breath, and mild fatigue.  Review of systems negative for any fever,  weight loss, abdominal pain, nausea, diarrhea, constipation, or  dizziness.   PHYSICAL EXAMINATION:  GENERAL:  She is a well-developed, well-nourished  female in no acute distress.  VITAL SIGNS:  She is currently afebrile with a blood pressure of 93/51  and pulse of 71 and respiratory rate of 28.  Current O2 sat is 96% on 2  liters O2.  NECK:  No obvious JVD at this time.  No obvious masses.  LUNGS:  Respiratory effort is normal.  HEART:  Irregularly irregular rhythm.  ABDOMEN:  Soft.  Her abdomen shows no obvious masses or  hepatosplenomegaly.  No tenderness.  GU:  Some mild atrophic change with normal urethral meatus.  No obvious  inflammation or lesions noted.  No significant prolapse.  EXTREMITIES:  No evidence of vascular insufficiency and no significant  edema at this time.   ASSESSMENT:  Gross hematuria.  This was asymptomatic hematuria which has  now resolved.  This may be indeed related to her anticoagulation as well  as the von Willebrand issues.  This cannot, however, be assumed and she  does need assessment to be sure that she does not have significant  pathology that was simply unmasked by anticoagulation.  We will plan on  sending urine for cytology.  I could find no records of any upper tract  imaging, so renal ultrasound will be ordered.  The patient will need  flexible cystoscopy  either at the bedside or potentially as an  outpatient depending on the length of her stay and ability to arrange  for that to be performed over the next day or two.      Valetta Fuller, M.D.  Electronically Signed     DSG/MEDQ  D:  03/07/2008  T:  03/07/2008  Job:  [id number]

## 2010-09-09 NOTE — Discharge Summary (Signed)
[name], [name]                 ACCOUNT NO.:  [id number]   MEDICAL RECORD NO.:  [id number]          PATIENT TYPE:  INP   LOCATION:  3714                         FACILITY:  [facility]   PHYSICIAN:  Kela Millin, M.D.DATE OF BIRTH:  Sep 04, 1924   DATE OF ADMISSION:  08/09/2008  DATE OF DISCHARGE:  08/21/2008                               DISCHARGE SUMMARY   DISCHARGE DIAGNOSES:  1. Bright red blood per rectum.  Colonoscopy completely normal per      Gastroenterology except for small internal hemorrhoids, hemoglobin      stable.  2. Chronic obstructive pulmonary disease exacerbation.  3. Severe esophageal dysmotility.  4. Escherichia coli and enterococcus urinary tract infection.  5. Paroxysmal atrial fibrillation.  Some exposed atrioventricular      nodal ablation and placement of a [employer] biventricular pacemaker      in February 2010.  6. History of ischemic cardiomyopathy with most recent ejection      fraction in October 2009 of 55%, history of heart failure.  7. History of coronary artery disease status post coronary artery      bypass graft x4 in 2000.  8. History of left atrial appendage thrombus.  9. Diabetes mellitus.  10.Hypertension.  11.Hyperlipidemia.  12.? History of Von Willebrand's disease.  13.History of pulmonary hypertension.  14.History of multiple falls.  15.History of pernicious anemia.  16.History of noncompliance.  17.History of mild dementia.   PROCEDURES AND STUDIES:  1. Colonoscopy on August 16, 2008.  Small internal hemorrhoids,      otherwise normal study.  2. Renal ultrasound.  Unremarkable study.  3. Barium swallow.  Severe dysmotility with reverse peristalsis noted      above the aortic arch.  Small sliding hiatal hernia without      demonstration of reflux or stricture.  A patch of penetration noted      without aspiration.   CONSULTATIONS:  1. Gastroenterology, Dr. Madilyn Fireman  2. Cardiology, Dr. Eden Emms  3. Pulmonology, Dr. Maple Hudson.   BRIEF  HISTORY:  The patient is an 77 year old white female with above-  listed medical problems who presented with complaints of bright red  blood per rectum.  She also reported epigastric pain.  She said that she  had a history of hemorrhoids but had never had rectal bleeding.  It was  noted that she is on chronic Coumadin and her INR on admission was 3.7.  She was admitted for further evaluation and management.   Please see the full admission history and physical dictated on August 10, 2008, by Dr. Adela Glimpse for the details of the admission and physical exam  as well as her laboratory data.   HOSPITAL COURSE:  1. Bright red blood per rectum.  As discussed above, it was noted that      her INR was supratherapeutic at 3.7 and so she was given vitamin K      and fresh frozen plasma upon admission.  Her H and H were monitored      and they remained stable.  GI was consulted and Dr. Ewing Schlein saw the  patient initially, and monitored the patient.  Subsequently, Dr.      Madilyn Fireman did a colonoscopy on August 14, 2008, and the results are      stated above.  The patient was started on liquid diet and it was      advanced.  Even with that, she did not have any further bleeding.      With her hemoglobin remaining stable and no further evidence of      bleeding, her Coumadin was restarted.  Her hemoglobin has remained      stable on the Coumadin.  Her last hemoglobin today prior to      discharge is 14.1.  She is to followup outpatient with      Gastroenterology.  2. COPD exacerbation.  She has a history of COPD and upon admission      chest x-ray did not show any acute abnormalities.  While in the      hospital, she developed shortness of breath and repeat chest x-ray      was done which did not show any acute infiltrates.  She also  was      coughing and on exam found to be wheezing.  She was placed on      antibiotics along with her nebulized bronchodilators.  Pulmonology      was consulted and Dr.  Maple Hudson saw the patient.  High-dose Solu-Medrol      was added.  The patient's symptoms were slow to improve and      subsequently Mucomyst nebulizers were added as well as      expectorants.  With this intervention, her symptoms began      improving.  She is home O2-dependent.  Her cough and shortness of      breath have improved.  She has remained afebrile and      hemodynamically stable.  Her steroids were tapered and she is now      on prednisone and will be discharged on a prednisone taper along      with expectorants, and she is to continue her outpatient      bronchodilators.  She has completed a brief course of antibiotics      for the COPD exacerbation.  3. E. coli and enterococcus urinary tract infection.  The patient had      urine cultures done upon admission and they grew enterococcus and      E. coli.  The patient was empirically placed on chloroquinolone for      the UTI.  After the sensitivities came back, it was noted that they      were resistant to most of the antibiotics including the Levaquin      but were sensitive to nitrofurantoin.  The patient was then started      on nitrofurantoin but subsequently upon evaluation of her renal      function, it was noted that her creatinine clearance was less than      50 and Pharmacy recommended that for this patient that      nitrofurantoin was contraindicated.  Following this, the      nitrofurantoin has been discontinued.  The patient has been      afebrile and hemodynamically stable also with no urinary symptoms.      She is to follow up with her PCP for further monitoring and      management as appropriate.  4. Coronary artery disease/ischemic cardiomyopathy/atrial      fibrillation.  Cardiology  was consulted and followed, and managed      the patient's cardiac problems.  Coumadin was resumed once the      colonoscopy had shown on active bleeding and her hemoglobin      remained stable.  Her PT/INR have been monitored and  her last INR      on August 20, 2008, was 2.5.  Her recommended Coumadin dosing prior      to discharge is Coumadin 5 mg daily except for 7.5 mg on Mondays      and Thursdays.  She is to have a PT/INR done on August 23, 2008, and      the results called to her outpatient physician for further      adjustment of dose as appropriate.  After the patient's GI bleed      had resolved, Cardiology also restarted the patient on Lasix and      subsequently her dose was reduced from her outpatient dose of 40 mg      to 20 mg, and she is to continue this dose and followup with      Cardiology outpatient.  5. Severe esophageal dysmotility.  The patient complained of dysphagia      while in the hospital and a barium swallow was done with the      results as stated above.  She was placed on a PPI during her      hospital stay and also following these results, Reglan was added.      Speech Therapy saw the patient for an eval and recommendations, and      they have given her instructions for reflux precautions, taking      small bites, following solids with liquids.  She is to follow up      outpatient with her primary care physician, also with Dr. Madilyn Fireman.  6. Diabetes mellitus.  Accu-Cheks were monitored and she was covered      with her Lantus as well as sliding scale insulin.  She is to      continue outpatient Lantus upon discharge and follow up with Dr.      Lucianne Muss.  7. Her chronic other medical problems remained stable during this      hospital stay.  She was maintained on her outpatient medications      and is to continue these upon discharge.   DISCHARGE MEDICATIONS:  1. Prilosec 40 mg 1 p.o. b.i.d.  2. Prednisone taper as directed.  3. Reglan 5 mg 1 p.o. q.a.c.  4. Flovent 2 puffs b.i.d.  5. Mucinex 1 p.o. b.i.d.  6. Coumadin 5 mg daily except 7.5 mg on Mondays and Thursdays or as      directed by her primary care physician.  7. Patient to continue Synthroid 200 mcg p.o. daily.  8. Metoprolol  100 mg p.o. daily.  9. Vitamin B12.  10.Lantus 46 units daily.  11.Benicar 5 mg daily.  12.Lasix 20 mg p.o. daily.  13.KCl 10 mEq p.o. daily.  14.Lovaza 1000 mg p.o. q.i.d.  15.Patient to hold off Crestor until she follows up with Cardiology.  16.Humalog as previously.  17.Patient to continue Xopenex and Atrovent nebulizers as previously.  18.Patient to continue home O2 as previously.   DISCHARGE CONDITION:  Improved/stable.   FOLLOWUP CARE:  1. Dr. Lucianne Muss in 1 week, patient to call for appointment.  2. Dr. Jens Som in 1-2 weeks, patient to call for appointment.  3. Dr. Maple Hudson in 2-3 weeks, patient to call for  appointment.  4. Dr. Madilyn Fireman in 2-3 weeks, patient to call for appointment.   The patient is to have PT/INR done by [facility] RN on August 23, 2008,  and the results called to her PCP/Dr. Lucianne Muss for adjustment of her  Coumadin dose as appropriate.   DISCHARGE DIET:  Two-gram sodium, modified carbohydrate.      Kela Millin, M.D.  Electronically Signed     ACV/MEDQ  D:  08/21/2008  T:  08/21/2008  Job:  [id number]   cc:   [name], M.D.  [name] Jens Som, MD, [facility]  Clinton D. Maple Hudson, MD, FCCP, FACP  John C. Madilyn Fireman, M.D.

## 2010-09-09 NOTE — Discharge Summary (Signed)
[name], [name]                 ACCOUNT NO.:  [id number]   MEDICAL RECORD NO.:  [id number]          PATIENT TYPE:  INP   LOCATION:  5508                         FACILITY:  [facility]   PHYSICIAN:  Hollice Espy, M.D.DATE OF BIRTH:  1925/03/03   DATE OF ADMISSION:  05/01/2008  DATE OF DISCHARGE:  05/03/2008                               DISCHARGE SUMMARY   PRIMARY CARE PHYSICIAN:  [name], M.D.   DISCHARGE DIAGNOSES:  1. Hyperglycemia with type 2 diabetes, nonketotic.  2. Dehydration.  3. Chronic renal insufficiency.  4. Chronic systolic congestive heart failure with no acute      exacerbation.  5. Hypertension.  6. Hypernatremia felt be secondary to pseudohyponatremia secondary      hyperglycemia.  7. History of hypothyroidism.   DISCHARGE MEDICATIONS:  The patient resume all of her previous  medications without change.  1. Synthroid 250 mcg p.o. daily.  2. Crestor 10 p.o. daily.  3. Digoxin 0.125 p.o. daily.  4. Metoprolol 100 p.o. daily.  5. K-Dur 10 mg p.o. daily.  6. Benicar 5 p.o. daily.  7. Omega-3 p.o. daily.  8. Vitamin B12 p.o. daily.  9. Vitamin D p.o. daily.  10.Lantus insulin 40 units subcu daily.  11.Sliding scale insulin NovoLog t.i.d. with meals.  12.Nebulizer treatments p.r.n.  13.Coumadin 5 mg on Mondays, Wednesdays, Thursdays, Saturdays and      Sundays and Coumadin 7 mg on Tuesdays and Fridays.   HOSPITAL COURSE:  The patient is an 77 year old white female with past  history of diabetes mellitus and CHF as above who presented with  generalized weakness and fatigue, and she was found to have blood sugars  in the 690s.  She had been started on insulin Glucommander drip, and her  sugars improved nicely.  In regards to her diabetes mellitus, an A1c was  checked and found to have an A1c of 9.7 with an average sugar of 230.  She herself tells me that normally her sugars run in the 120 every time  she checked it, and she could not account for this  discrepancy.  She  says that she checks her sugars three times a day before meals.  Overall, she was able to tolerate p.o. by hospital day #2, and her  sugars have remained stable, running anywhere from 100s-150s.  Other  outside sources of hyperglycemia were searched.  She had no signs of  dehydration, no signs of urinary tract infection, no signs of infection.  She tells me that she went out to dinner that day and did not take her  medicine which would account for her hyperglycemia.   In regards to her CHF, this was a stable issue.  A BNP was checked on  hospital day #1 and found to be at 127.   In regards to history of atrial fibrillation, atrial appendage clot, she  is on Coumadin.  Her INR on time of admission was 2.4 and over the next  several days stayed stable.  This dropped down to 1.8 on day of  discharge.  She was given her dose of Coumadin at  the increased dose of  7 mg at an earlier time to account for this.   The patient's other medical issues remained stable during this  hospitalization.  However, in a discussion with the patient, she tells  me that she feels very overwhelmed at home.  She has a husband who is  deaf, and I talked about the possibility of alternative living  arrangements because she expressed concern about some time getting  forgetful with her medicines which may be the cause for why she has had  problems with generalized malaise, weakness for the last few weeks and  account for her higher A1c.  For short term purposes, my plan is for the  patient to follow up with home health RN for safety eval and to follow  with her PCP in the next few days.  I have already spoken and left a  message with him about looking at the possibility of  alternative living  arrangements.  The patient says she does not want to give up her house  but she does have a daughter who she has talked to about the possibility  of moving in with them which I told her was a very good idea  to start  this sooner than later.   Overall disposition from initial presentation is improved.  Activity as  tolerated.  Discharge diet will be a heart-healthy carb modified diet.  She is being discharged to home.  She will follow up with her PCP, Dr.  [name] at the beginning of next week.      Hollice Espy, M.D.  Electronically Signed     SKK/MEDQ  D:  05/03/2008  T:  05/03/2008  Job:  [id number]   cc:   [name], M.D.

## 2010-09-11 ENCOUNTER — Inpatient Hospital Stay (HOSPITAL_COMMUNITY)
Admission: EM | Admit: 2010-09-11 | Discharge: 2010-09-15 | DRG: 190 | Disposition: A | Discharge status: Home / Self Care | Payer: Medicare Other | Attending: Cardiology | Admitting: Cardiology

## 2010-09-11 ENCOUNTER — Emergency Department (HOSPITAL_COMMUNITY): Payer: Medicare Other

## 2010-09-11 DIAGNOSIS — D649 Anemia, unspecified: Secondary | ICD-10-CM | POA: Diagnosis present

## 2010-09-11 DIAGNOSIS — E785 Hyperlipidemia, unspecified: Secondary | ICD-10-CM | POA: Diagnosis present

## 2010-09-11 DIAGNOSIS — Z950 Presence of cardiac pacemaker: Secondary | ICD-10-CM

## 2010-09-11 DIAGNOSIS — Z87891 Personal history of nicotine dependence: Secondary | ICD-10-CM

## 2010-09-11 DIAGNOSIS — K219 Gastro-esophageal reflux disease without esophagitis: Secondary | ICD-10-CM | POA: Diagnosis present

## 2010-09-11 DIAGNOSIS — J441 Chronic obstructive pulmonary disease with (acute) exacerbation: Principal | ICD-10-CM | POA: Diagnosis present

## 2010-09-11 DIAGNOSIS — F0390 Unspecified dementia without behavioral disturbance: Secondary | ICD-10-CM | POA: Diagnosis present

## 2010-09-11 DIAGNOSIS — I4891 Unspecified atrial fibrillation: Secondary | ICD-10-CM | POA: Diagnosis present

## 2010-09-11 DIAGNOSIS — E1149 Type 2 diabetes mellitus with other diabetic neurological complication: Secondary | ICD-10-CM | POA: Diagnosis present

## 2010-09-11 DIAGNOSIS — Z794 Long term (current) use of insulin: Secondary | ICD-10-CM

## 2010-09-11 DIAGNOSIS — I2510 Atherosclerotic heart disease of native coronary artery without angina pectoris: Secondary | ICD-10-CM | POA: Diagnosis present

## 2010-09-11 DIAGNOSIS — I509 Heart failure, unspecified: Secondary | ICD-10-CM | POA: Diagnosis present

## 2010-09-11 DIAGNOSIS — I5043 Acute on chronic combined systolic (congestive) and diastolic (congestive) heart failure: Secondary | ICD-10-CM | POA: Diagnosis present

## 2010-09-11 DIAGNOSIS — Z7901 Long term (current) use of anticoagulants: Secondary | ICD-10-CM

## 2010-09-11 DIAGNOSIS — Z888 Allergy status to other drugs, medicaments and biological substances status: Secondary | ICD-10-CM

## 2010-09-11 DIAGNOSIS — Z882 Allergy status to sulfonamides status: Secondary | ICD-10-CM

## 2010-09-11 DIAGNOSIS — I10 Essential (primary) hypertension: Secondary | ICD-10-CM | POA: Diagnosis present

## 2010-09-11 DIAGNOSIS — E1142 Type 2 diabetes mellitus with diabetic polyneuropathy: Secondary | ICD-10-CM | POA: Diagnosis present

## 2010-09-11 DIAGNOSIS — E039 Hypothyroidism, unspecified: Secondary | ICD-10-CM | POA: Diagnosis present

## 2010-09-11 DIAGNOSIS — E669 Obesity, unspecified: Secondary | ICD-10-CM | POA: Diagnosis present

## 2010-09-11 DIAGNOSIS — R0602 Shortness of breath: Secondary | ICD-10-CM

## 2010-09-11 DIAGNOSIS — Z951 Presence of aortocoronary bypass graft: Secondary | ICD-10-CM

## 2010-09-11 LAB — DIFFERENTIAL
Basophils Relative: 0 % (ref 0–1)
Eosinophils Absolute: 0.1 10*3/uL (ref 0.0–0.7)
Neutrophils Relative %: 70 % (ref 43–77)

## 2010-09-11 LAB — BASIC METABOLIC PANEL
Chloride: 98 mEq/L (ref 96–112)
GFR calc Af Amer: >60 mL/min (ref >60)
Potassium: 3.7 mEq/L (ref 3.5–5.1)

## 2010-09-11 LAB — CBC
Platelets: 208 10*3/uL (ref 150–400)
RBC: 4.01 MIL/uL (ref 3.87–5.11)
WBC: 6.5 10*3/uL (ref 4.0–10.5)

## 2010-09-11 LAB — PRO B NATRIURETIC PEPTIDE: Pro B Natriuretic peptide (BNP): 2571 pg/mL — ABNORMAL HIGH (ref 0–450)

## 2010-09-12 ENCOUNTER — Inpatient Hospital Stay (HOSPITAL_COMMUNITY): Payer: Medicare Other

## 2010-09-12 DIAGNOSIS — J441 Chronic obstructive pulmonary disease with (acute) exacerbation: Secondary | ICD-10-CM

## 2010-09-12 DIAGNOSIS — I5033 Acute on chronic diastolic (congestive) heart failure: Secondary | ICD-10-CM

## 2010-09-12 LAB — TROPONIN I: Troponin I: <0.3 ng/mL (ref <0.30)

## 2010-09-12 LAB — BASIC METABOLIC PANEL
BUN: 14 mg/dL (ref 6–23)
Chloride: 100 mEq/L (ref 96–112)
Potassium: 3.6 mEq/L (ref 3.5–5.1)
Sodium: 137 mEq/L (ref 135–145)

## 2010-09-12 LAB — POCT CARDIAC MARKERS
CKMB, poc: <1 ng/mL — ABNORMAL LOW (ref 1.0–8.0)
Troponin i, poc: <0.05 ng/mL (ref 0.00–0.09)

## 2010-09-12 LAB — HEMOGLOBIN AND HEMATOCRIT, BLOOD: HCT: 33.3 % — ABNORMAL LOW (ref 36.0–46.0)

## 2010-09-12 LAB — GLUCOSE, CAPILLARY
Glucose-Capillary: 233 mg/dL — ABNORMAL HIGH (ref 70–99)
Glucose-Capillary: 167 mg/dL — ABNORMAL HIGH (ref 70–99)
Glucose-Capillary: 256 mg/dL — ABNORMAL HIGH (ref 70–99)
Glucose-Capillary: 264 mg/dL — ABNORMAL HIGH (ref 70–99)

## 2010-09-12 LAB — CARDIAC PANEL(CRET KIN+CKTOT+MB+TROPI)
CK, MB: 1.8 ng/mL (ref 0.3–4.0)
Relative Index: INVALID (ref 0.0–2.5)

## 2010-09-12 LAB — PROTIME-INR
INR: 1.87 — ABNORMAL HIGH (ref 0.00–1.49)
Prothrombin Time: 21.7 seconds — ABNORMAL HIGH (ref 11.6–15.2)

## 2010-09-12 LAB — CK TOTAL AND CKMB (NOT AT ARMC): Relative Index: INVALID (ref 0.0–2.5)

## 2010-09-12 LAB — APTT: aPTT: 61 seconds — ABNORMAL HIGH (ref 24–37)

## 2010-09-12 NOTE — Consult Note (Signed)
[name], [name]                 ACCOUNT NO.:  [id number]   MEDICAL RECORD NO.:  [id number]          PATIENT TYPE:  INP   LOCATION:  4702                         FACILITY:  [facility]   PHYSICIAN:  Vida Roller, M.D.   DATE OF BIRTH:  [birth year]   DATE OF CONSULTATION:  01/26/2004  DATE OF DISCHARGE:                                   CONSULTATION   PRIMARY:  Dr. Lucianne Muss, with Dr. Leslie Dales.   REFERRING PHYSICIAN:  Dr. Dagoberto Ligas.   CARDIOLOGIST:  Dr. Loraine Leriche Pulsipher.   HISTORY OF PRESENT ILLNESS:  Ms. Ezelle is a 77 year old woman with a  history of coronary artery disease status post bypass surgery in 2000 who  was admitted to the hospital on the January 24, 2004, for COPD  exacerbation.  She was found to have an irregular heart rate with evidence  of bradycardia and we were asked to evaluate.  She denies any chest  discomfort, shortness of breath.  She does have some discomfort when she  coughs and she does bring up yellow sputum.  She has not had a fever.  She  has had no palpitations or syncope recently, although she was recently  hospitalized for that.   PAST MEDICAL HISTORY:  Significant for:  1.  Diabetes requiring insulin.  2.  Dyslipidemia.  3.  Obesity.  4.  Hypertension.  5.  Hypothyroidism.  6.  Diabetic neuropathy.  7.  Chronic obstructive pulmonary disease.  8.  Gastroesophageal reflux disease.  9.  Anemia.   She has had reconstructive jaw surgery and she has had herniorrhaphy.   ALLERGIES:  SHE IS ALLERGIC TO A LARGE LIST OF MEDICATIONS.  PLEASE REFER TO  HER HOSPITAL CHART FOR ALL OF THESE.   She was recently evaluated in July of 2005 for an episode of syncope.  Seen  by both cardiology and neurology, felt to have orthostatic hypotension  secondary to her medications and it was recommended that these be adjusted,  which they were.  She was discharged.  She was readmitted with the COPD  exacerbation.   MEDICATIONS ARE AS FOLLOWS:  1.  Avelox 400 mg q.24  hours.  2.  Prednisone 20 mg three times a day.  3.  Albuterol nebulizers q.4 hours.  4.  Lasix 40 mg IV daily.  5.  K-Dur 20 mEq once daily.  6.  She is now on Synthroid replacement therapy.   FAMILY HISTORY:  Is positive for coronary disease.   SOCIAL HISTORY:  She lives with her husband.  She used to smoke cigarettes  but stopped in 1986.  She does not drink any alcohol.   REVIEW OF SYSTEMS:  Is generally negative except for that reviewed in the  history of present illness.   PHYSICAL EXAM:  She is a well-developed, well-nourished, mildly overweight  white female in no apparent distress.  She was alert and oriented x4 and a  reasonably good historian.  Her blood pressure 136/50, heart rate 72 and  irregular, her respiratory rate is 02, she is saturating at 97% on 2 liters  nasal cannula.  HEENT:  Unremarkable.  NECK:  Supple.  There is no jugular venous distention or carotid bruits.  CHEST:  Clear to auscultation except for some decreased breath sounds on the  left base.  HEART:  Irregularly irregular with no obvious murmur.  ABDOMEN:  Soft, nontender.  LOWER EXTREMITIES:  Without significant clubbing, cyanosis or edema.  Pulses  were 1-2+.  NEUROLOGIC EXAM:  Nonfocal.   Chest x-ray:  She has a mild right basilar atelectasis with a question of a  left effusion.   White blood cell count 8.9 with an H&H of 14 and 46, platelet count 296,  sodium 137, potassium 4.3, chloride 97, bicarb 32, BUN 12, creatinine 1.0,  and her blood sugar was 183.  Liver function studies were within normal  limits.  Two point of care enzymes were negative.  Her B-type natriuretic  peptide is 91.  Electrocardiogram, which was just obtained, shows atrial  fibrillation with a controlled ventricular rate at 85.  She has a normal QRS  duration at 70 msec.  She has a normal QRS axis.  She has no ischemic ST-T  wave changes concerning for ischemia and she has no Q-waves.   ASSESSMENT:  1.  Bradycardia  with new onset atrial fibrillation on no rate controlling      agents.  She probably does not require these.  2.  She has community-acquired pneumonias.  3.  Diabetes mellitus.  4.  Hypothyroidism by history.  5.  Congestive heart failure.  Her exam is otherwise normal.  There is no      physical findings consistent with congestive heart failure.  Her B-type      natriuretic peptide is 191.  Her chest x-ray does not show congestive      heart failure, so I doubt this diagnosis.  6.  Coronary artery disease status post bypass surgery, this appears to be      quiescent.  7.  Hypertension, which appears to be reasonably well controlled.   My recommendations are:  1.  Thyroid function studies be checked to ensure that she is neither hypo      nor hyperthyroid.  2.  Echocardiogram be done to assess her left ventricular systolic function      and the structural integrity of her heart.  3.  I would cycle her cardiac enzymes.  4.  Check a magnesium level.  She probably deserves to be anticoagulated      with heparin with a transition to Coumadin for stroke prophylaxis.      Trey Paula   JH/MEDQ  D:  01/26/2004  T:  01/27/2004  Job:  [id number]

## 2010-09-12 NOTE — Consult Note (Signed)
[name], [name]                 ACCOUNT NO.:  [id number]   MEDICAL RECORD NO.:  [id number]          PATIENT TYPE:  INP   LOCATION:  4702                         FACILITY:  [facility]   PHYSICIAN:  Vida Roller, M.D.   DATE OF BIRTH:  1924/06/04   DATE OF CONSULTATION:  DATE OF DISCHARGE:                                   CONSULTATION   ADDENDUM:   PRIMARY CARE PHYSICIAN:  Dr. Leslie Dales.   CARDIOLOGIST:  Dr. Gerri Spore.   In speaking with the patient and detailing the treatment of atrial  fibrillation to her, she mentions to me that she has von Willebrand's  disease and has had recent platelet transfusions due to bleeding after a  surgery.  She is followed by the hematology oncology service here at [facility].  Therefore, I think anticoagulating her with heparin and Coumadin is  probably not a good idea until she is seen by the hematology consultants and  they make a recommendation as to what type of anticoagulation she can  tolerate due to her atrial fibrillation.  She does not need any further  treatment and I would continue to follow through with the outline I  previously stated with the exception that I would not anticoagulate her.      Trey Paula   JH/MEDQ  D:  01/26/2004  T:  01/27/2004  Job:  [id number]   cc:   Veverly Fells. Altheimer, M.D.  [address]  [place]  [place] [address]  Fax: [phone]   Lauris Poag, M.D.  [address]  [place]  [place] [address]  Fax: [phone]

## 2010-09-12 NOTE — Consult Note (Signed)
NAME:  [name], [name]                 ACCOUNT NO.:  [id number]   MEDICAL RECORD NO.:  [id number]          PATIENT TYPE:  INP   LOCATION:  5020                         FACILITY:  [facility]   PHYSICIAN:  Clinton D. Maple Hudson, MD, FCCP, FACPDATE OF BIRTH:  06/21/26   DATE OF CONSULTATION:  07/24/2006  DATE OF DISCHARGE:                                 CONSULTATION   PULMONARY CONSULTATION:   PROBLEM:  A 77 year old former smoker with a long history of cough and  dyspnea, now evaluated for exacerbation.   HISTORY:  She has been managed with home bronchodilators.  She presented  to the emergency room with a 3-day history of fever, aches and fatigue,  treated with Zithromax without substantial improvement.  In the  emergency room she was found to be hypoxic with a saturation of 87% and  with wheezing.  She was treated with steroids and nebulizer therapy and  admitted.  Since then therapy has included nebulized albuterol with  ipratropium, intravenous Rocephin.  The chest x-ray on admission  demonstrated peribronchial thickening, chronic obstructive pulmonary  disease, and postop coronary bypass changes without infiltrate or  effusion.  She says breathing improved initially and she is better than  admission but still not back to baseline.  Cough has been nonproductive.  She is becoming concerned that midthoracic back pain is a significant  limiting factor on her exercise tolerance.   REVIEW OF SYSTEMS:  Nasal congestion which is perennial and worse any  time, year-around, that she is out of doors.  Thoracic back pain.  No  significant head congestion or postnasal drainage.  No chest pain or  palpitations.  Back pain as noted, which is nonradiating but aggravated  by any deep breath and isolated at about the level of her bra strap at  the midthoracic level.  She is not aware of active reflux.  Feet have  not been swelling.  She does not notice a fever, purulent sputum,  adenopathy, reflux  symptoms, blood, or purulent discharge.   PAST HISTORY:  1. Asthma with chronic obstructive pulmonary disease.  2. Musculoskeletal chest wall pain.  3. Coronary disease/bypass/congestive heart failure.  4. Diabetes.  5. Hypothyroid.  6. Gallbladder disease.  7. Von Willebrand disease with preventative Coumadin use for      paroxysmal atrial fibrillation.  8. Pulmonary hypertension.  9. Pernicious anemia.   HOME MEDICATIONS:  Her home medicine list had included:   1. Glucotrol XL 10 mg.  2. Micardis 80 mg.  3. Lantus.  4. Vitamin B12.  5. Doxazosin 4 mg.  6. Synthroid.  7. Cymbalta 30 mg.  8. Niacin.  9. Ambien 10 mg p.r.n.  10.An albuterol rescue inhaler.  11.Home nebulizer with Xopenex 1.25 mg up to t.i.d.  12.Pulmicort 0.25 mg by nebulizer b.i.d.   Drug intolerance to Biaxin, cefaclor, carbamazepine dicyclomine,  and  sulfa.   FAMILY HISTORY:  Noncontributory at this time.   OBJECTIVE:  VITAL SIGNS:  Temperature 98.4, BP 88/45, pulse regular 73,  room air saturation 95%.  GENERAL:  She is alert, calm, recognizes me on entry  into the room.  Speech is clear.  SKIN:  No obvious rash.  ADENOPATHY:  None found at the neck, shoulders or axillae.  HEENT:  Oral mucosa is clear.  Voice quality is normal.  There is no  neck vein distension.  Breath sounds are diminished, end-expiratory  phase is slow, but she is not using accessory muscles.  There is no  dullness or pleural rub, and I hear no rhonchi or wheeze.  HEART:  Regular rhythm, no murmur or gallop.  ABDOMEN:  Soft without hepatosplenomegaly.  EXTREMITIES:  No tremor, edema, cyanosis or clubbing.  Peripheral pulses  are present.   LABORATORY DATA:  Hemoglobin at 13.4, WBC 8000.  Cultures at this point  are negative.   IMPRESSION:  1. Acute exacerbation of asthma with chronic bronchitis.  A viral      syndrome is the likeliest explanation.  Hopefully, she can taper      quickly on steroids, which will simplify her  insulin and diabetes      management.  She has a home nebulizer and metered inhaler.  A      simple steroid taper may be sufficient.  2. There is a long history of allergic rhinitis, perennial.  We can      look at treatment options as she returns to the office, but I would      emphasize use of nasal steroids and antihistamines as first      therapy.  I can look at options when she returns for office follow-      up.  3. She is describing some significant respiratory discomfort      associated with forcing a deep breath of because thoracic spine      pain at the midthoracic level.  She has a history of known cervical      spine disease.  I talked over options with her and suggested that      while she is in hospital and her blood sugar management is      stabilizing, that we can get pulmonary function tests and an x-ray      of the spine.  After that I would look for establishment of a      prednisone taper and discharge home for outpatient management of      her pulmonary regimen when you are otherwise ready.  I will be      happy to follow her as an outpatient and happy to see her again as      needed while she is an inpatient.   I appreciate the chance to see her again.      Clinton D. Maple Hudson, MD, Tonny Bollman, FACP  Electronically Signed     CDY/MEDQ  D:  07/26/2006  T:  07/27/2006  Job:  [id number]   cc:   [name], M.D.

## 2010-09-12 NOTE — H&P (Signed)
[name], [name]                 ACCOUNT NO.:  [id number]   MEDICAL RECORD NO.:  [id number]          PATIENT TYPE:  INP   LOCATION:  5020                         FACILITY:  [facility]   PHYSICIAN:  Alfonse Alpers. Gegick, M.D.DATE OF BIRTH:  06-06-1924   DATE OF ADMISSION:  07/24/2006  DATE OF DISCHARGE:                              HISTORY & PHYSICAL   CHIEF COMPLAINT:  This is 77 year old woman who presents with a history  of cough and shortness of breath.   HISTORY OF PRESENT ILLNESS:  She has a history of chronic pulmonary  disease.  She has at home handheld nebulizers and also albuterol  inhalers.  She had symptoms consistent with a bronchitis and was treated  with Zithromax.  She had some improvement but continued to have  coughing.  She had more shortness of breath and presented to the  emergency room with a oxygen saturation of 87%.  She was also wheezing.  She was treated with an injection of steroids and also nebulizer  treatment with Atrovent and albuterol and was admitted to the hospital.   She has a history of multiple medical problems and these include  hypothyroidism.  She has required a large dose of Synthroid.  In  addition she has a history of dyslipidemia for which she has trouble  tolerating medications and also has a history of hypertension and also  arteriosclerotic heart disease.  She has had diabetes since the age of  31.  She also has a history of arteriosclerotic heart disease.  She had  a coronary artery bypass graft in the year 2000.   PERSONAL HISTORY:  She does not smoke or drink excessive amounts of  alcohol.   ALLERGIES:  She is allergic to some medications, however there is some  question.  She states that she is not allergic to NIACIN which is on  record she is allergic to and she is also questionably allergic to  NORMODYNE, BEXTRA, LIPITOR,  __________ , TRILEPTAL.   REVIEW OF SYSTEMS:  Her weight has been gradually increasing.  CARDIOVASCULAR/RESPIRATORY:  No history of chest pain.  GI: No  complaints.   PHYSICAL EXAM:  GENERAL:  This is a well-developed woman who appears  clinically stable.  HEAD:  Normocephalic.  NECK:  Supple.  LUNGS:  reveal wheezes to be present bilaterally.  Breath sounds were  distant.  CARDIOVASCULAR:  Rhythm is regular.  BREASTS: No masses are present.  ABDOMEN: No masses or tenderness is present.  EXTREMITIES: Peripheral pulses are present bilaterally.  Her feet her  cool.  She does have neuropathy associated with her diabetes.   IMPRESSION:  1. Acute bronchitis with chronic obstructive pulmonary disease and      bronchospasm.  2. Diabetes mellitus type 2 requiring insulin.  3. Hypothyroidism.  4. Hypertension.  5. Dyslipidemia.           ______________________________  Alfonse Alpers. Dagoberto Ligas, M.D.     CGG/MEDQ  D:  07/25/2006  T:  07/25/2006  Job:  [id number]

## 2010-09-12 NOTE — H&P (Signed)
[name], [name]                 ACCOUNT NO.:  [id number]   MEDICAL RECORD NO.:  [id number]          PATIENT TYPE:  INP   LOCATION:  5151                         FACILITY:  [facility]   PHYSICIAN:  [name], M.D.       DATE OF BIRTH:  17-Dec-1924   DATE OF ADMISSION:  01/19/2004  DATE OF DISCHARGE:                                HISTORY & PHYSICAL   CHIEF COMPLAINT:  Shortness of breath.   HISTORY:  This is a 77 year old Caucasian lady, with known history of  asthma, who presented to the emergency room with increasing shortness of  breath.  The patient states that she started having increasing shortness of  breath about two to three days ago along with cough.  This cough was mostly  dry with only occasional greenish sputum.  The patient has had episodes of  acute bronchitis previously.  The patient was treated by the emergency room  in [place] two days ago and probably given steroids at least for the night,  and the patient says that she felt better only for about an hour and then  started having shortness of breath.  Her breathing, however, go worse on the  evening of admission, especially when she was trying to lie down.  She still  continued to have cough but no hemoptysis or fever.   The patient also states that blood sugars had gone up to 300 with steroids  given in the hospital, although they were better on the morning of  admission, down to about 120.   MEDICATIONS:  Glucotrol, insulin, Levsin, Humalog, aspirin, Cardura,  Micardis, DynaCirc, Synthroid, Pulmicort, __________, B12 injection monthly,  ProSom, Lyrica 75 b.i.d.   ALLERGIES:  She is intolerant to all STATIN drugs, LABETALOL, NIACIN, and  BEXTRA.   SOCIAL HISTORY:  She lives with her husband.  She is a nonsmoker but did  smoke until about 41.   FAMILY HISTORY:  Positive for diabetes, hypertension, and coronary disease.   PAST MEDICAL HISTORY:  1.  Coronary bypass in 2000.  2.  She was admitted to the  hospital about two months ago with severe      hypothyroidism and syncopal episodes, the etiology of which was not      determined.   REVIEW OF SYSTEMS:  She has had longstanding diabetes since 1988 with fairly  good control on insulin and Glucotrol.  Has previously been given insulin  sensitizers, too.  She has had hypertension with recent good control.  She  has had COPD followed by Dr. Maple Hudson with intermittent symptoms, currently  being somewhat better managed until this current episode.  She has had no  gripping GI complaints.  She complains of swelling of her legs since her  emergency room visit.  She has had periodic leg cramps and muscle cramps.  She has had longstanding hypothyroidism requiring rather high doses.  She  has had hypercholesterolemia, but she is intolerant to all treatments.   PHYSICAL EXAMINATION:  GENERAL:  The patient is alert and in mild  respiratory distress.  VITAL SIGNS:  Pulse 76, blood  pressure 140/70.  HEENT:  No cyanosis or pallor.  Mucous membranes slightly dry. ENT  examination otherwise normal.  NECK:  Jugular venous pressure normal.  Thyroid not enlarged, no carotid  bruit.  CARDIAC:  Heart sounds normal.  No S3 or S4.  LUNGS:  Bilateral wheezing.  ABDOMEN:  Distended but no tenderness or mass.  EXTREMITIES:  Trace ankle edema.   IMPRESSION:  Exacerbation of chronic obstructive pulmonary disease.   PLAN:  1.  Give IV steroids, nebulizers, oxygen, and start on Avelox.  2.  Will also get pulmonary consultation, and this was discussed with Dr.      Vilinda Flake, who is on call.  3.  Will also get x-ray of her left posterior ribs since she is complaining      of pleuritic chest pain there.       AK/MEDQ  D:  01/20/2004  T:  01/20/2004  Job:  [id number]

## 2010-09-12 NOTE — Assessment & Plan Note (Signed)
[facility]                              CARDIOLOGY OFFICE NOTE   NAME:[name], [name]                          MRN:          [id number]  DATE:  11/18/2005                              DOB:      24-Jan-1925    PRIMARY CARE PHYSICIAN:  Dr. [name].   PULMONOLOGIST:  Dr. Jetty Duhamel.   REASON FOR VISIT:  Preoperative evaluation.   HISTORY OF PRESENT ILLNESS:  I saw [name] back in April. She has a  history of multivessel coronary artery disease with preserved ejection  fraction status post coronary artery bypass grafting in 2000 as well as  paroxysmal atrial fibrillation in the setting of chronic obstructive  pulmonary disease with asthmatic bronchitis. She is undergoing an evaluation  for possible cholecystectomy through [facility]. She is referred for cardiovascular preoperative assessment.  Symptomatically [name] is not having any angina, but does experience  dyspnea on exertion. She also states that she has had some wheezing. She was  referred recently for chest x-ray by Dr. Maple Hudson, and she tells me that this  was felt to be stable. Her medications are outlined below. At this  particular point she has not yet decided whether she will have gallbladder  surgery or not.   Most recent cardiac testing includes myocardial perfusion study from 2005  demonstrating no ischemia with ejection fraction 68%. She had an  echocardiogram a little over a year ago that showed normal left ventricular  systolic function with ejection fraction of 60 to 65%, grade 1 diastolic  dysfunction, trace mitral and trace tricuspid regurgitation.   ALLERGIES:  BIAXIN, CEFACLOR, CARBAMAZEPINE, DICYCLOMINE, AND SULFA DRUGS.   PRESENT MEDICATIONS:  1.  Glucotrol XL 10 mg p.o. daily.  2.  Micardis 80 mg p.o. daily.  3.  Centrum Silver 1 p.o. daily.  4.  Lantus as directed.  5.  B12 injections.  6.  Doxazosin 4 mg p.o. daily.  7.   Synthroid 0.2 mg p.o. daily.  8.  Humalog as directed.  9.  Fish oil supplements.   REVIEW OF SYSTEMS:  As described in History of Present Illness. She has had  no cough or hemoptysis. She denies any palpitations or syncope. She has had  no spontaneous bleeding or hemarthroses.   PHYSICAL EXAMINATION:  VITAL SIGNS:  Blood pressure today 162/80, heart rate  84, weight 213 pounds.  GENERAL:  This is an overweight woman denying any active dyspnea at rest or  chest pain.  LUNGS:  Exhibit diminished breath sounds but no active wheezing.  CARDIAC:  Reveals regular rate and rhythm with soft systolic murmur, normal  S2, and no S3 gallop or pericardial rub.  ABDOMEN:  Soft without hepatomegaly or bruits.  EXTREMITIES:  Exhibit no significant pitting edema.   IMPRESSION AND RECOMMENDATIONS:  1.  Known multivessel coronary artery disease status post previous coronary      artery bypass grafting as outlined. Plan at this point will be to      proceed with a followup 2D echocardiogram to reassess cardiac structure  and function as well as valvular status and then proceed with an      adenosine Myoview to reassess for ischemia. She will, otherwise,      continue her cardiac regimen which essentially includes a baby aspirin      and Micardis. She is not using any nitrates at this point and is not on      beta blocker therapy given her lung disease. She has also not tolerated      calcium channel blocker therapy well in the past. We will review these      tests and assuming they are low risk, she should be a reasonable      candidate from a cardiovascular risk perspective for cholecystectomy if      she decides to proceed as such. Otherwise, we will plan to see her back      over the next six months.  2.  Von Willebrand disease.  3.  History of paroxysmal atrial fibrillation not on Coumadin. She is not      having any active palpitations and has been in sinus rhythm with      followup  electrocardiograms.  4.  History of chronic obstructive pulmonary disease with asthmatic      bronchitis followed by  Dr. Maple Hudson.                                [name] Sidle, MD    SGM/MedQ  DD:  11/18/2005  DT:  11/18/2005  Job #:  [id number]   cc:   [name], MD  Clinton D. Maple Hudson, MD, FCCP, FACP

## 2010-09-12 NOTE — Assessment & Plan Note (Signed)
[facility]                            CARDIOLOGY OFFICE NOTE   NAME:[name], [name]                        MRN:          [id number]  DATE:05/14/2006                            DOB:          1924/12/02    REASON FOR VISIT:  Cardiac followup.   HISTORY OF PRESENT ILLNESS:  I saw [name] back in July 2007.  From a  cardiac perspective, she has done fairly well with history of  multivessel coronary artery disease status post previous coronary artery  bypass grafting in 2000.  I referred her at that time as part of a  possible preoperative assessment for a myocardial perfusion study which  showed no significant ischemia and an ejection fraction of 64%.  Echocardiography also showed normal left ventricular systolic function  with no regional wall motion abnormalities.  She denies any problems  with angina.  She has stable dyspnea on exertion but is followed closely  by Dr. Maple Hudson with known asthmatic bronchitis and chronic obstructive  pulmonary disease.  She is denying any problems with palpitations but  has history of paroxysmal atrial fibrillation.   ALLERGIES:  BIAXIN, CEFACLOR, CARBAMAZEPINE, DICYCLOMINE, AND SULFA  DRUGS.   PRESENT MEDICATIONS:  1. Glucotrol XL 10 mg p.o. daily.  2. Micardis 80 mg p.o. daily.  3. Centrum Silver daily.  4. Lantus as directed.  5. B-12 injections.  6. Doxazosin 4 mg daily.  7. Synthroid 0.2 mg p.o. daily.  8. Humalog as directed.  9. Omega-3 supplements.  10.Ambien p.r.n.  11.Xopenex.  12.Pulmicort.  13.Tylenol.  14.Albuterol.   REVIEW OF SYSTEMS:  As described in the history of present illness.   EXAMINATION:  Blood pressure 146/78.  Heart rate 78.  Weight 212 pounds.  The patient is comfortable and in no acute distress.  NECK:  No elevated jugular venous pressure without bruits.  LUNGS:  Diminished breath sounds, no active wheezing or labored  breathing.  CARDIAC:  A regular rate and rhythm without  rub, murmur, S3 gallop.  No  pericardial rub is noted.  EXTREMITIES:  Exhibit no pitting edema.   IMPRESSION AND RECOMMENDATIONS:  1. Known multivessel coronary disease with preserved ejection      fraction, status post coronary artery bypass grafting in 2000.      Recent Myoview and echocardiogram were reassuring, showing no clear      evidence of ischemia with continued normal left ventricular      systolic function.  She will continue on her present regimen and I      will plan to see her back over the next 6 months.  2. History of paroxysmal atrial fibrillation, not on Coumadin or      aspirin at this point with a history of Von Willebrand's disease.      She has been in sinus rhythm on her followups and denies any      significant palpitations.  3. Known chronic obstructive pulmonary disease with asthmatic      bronchitis followed closely by Dr. Maple Hudson.     [name] Sidle, MD  Electronically Signed  SGM/MedQ  DD: 05/14/2006  DT: 05/14/2006  Job #: [id number]   cc:   [name], M.D.  [name] D. Young, MD, FCCP, FACP  [name]., M.D.

## 2010-09-12 NOTE — Consult Note (Signed)
NAME:  [name], [name]                 ACCOUNT NO.:  [id number]   MEDICAL RECORD NO.:  [id number]          PATIENT TYPE:  INP   LOCATION:  4706                         FACILITY:  [facility]   PHYSICIAN:  Clinton D. Maple Hudson, M.D. DATE OF BIRTH:  05-Dec-1924   DATE OF CONSULTATION:  01/28/2004  DATE OF DISCHARGE:                                   CONSULTATION   PROBLEM:  Pulmonary consultation requested by Dr. Lucianne Muss because of increased  dyspnea and exacerbation of chronic obstructive pulmonary disease.   This is a 77 year old white female with COPD and complicating medical  problems who had been hospitalized here 1 1/2 weeks ago for exacerbation of  COPD.  She had been discharged September 26 taking Avelox, Lantus, Lasix 20  mg daily, nebulized Albuterol and metered inhalers, and Vicodin.  In the  interim, she required frequent Famvir for zoster left chest wall.  She was  readmitted with exacerbation of COPD and coughing yellow sputum.  The BNP  was 191, pO2 90, pCO2 47.  In the hospital, there was concern about atrial  fibrillation which subsequently appears to be sinus rhythm with frequent  PACs.  She has been receiving Albuterol by nebulizer and Pulmicort by  metered inhaler.  P.o. prednisone has been increased to 20 mg b.i.d.  She  has been getting supplemental oxygen and initial antibiotic coverage with  Avelox has been changed to Zithromax.   REVIEW OF SYMPTOMS:  As stated, she is feeling better but still somewhat  congested and easily dyspneic, especially if she bends over.  She is aware  of occasional palpitations.  Chest pain now only if she coughs.   PAST MEDICAL HISTORY:  Coronary artery disease status post CABG, insulin  dependent diabetes mellitus, peripheral neuropathy, hypothyroid, Von  Willebrand's, allergic rhinitis, history of facial reconstruction,  tonsillectomy, appendectomy, abdominal herniae repair.   SOCIAL HISTORY:  Quit smoking 15 years ago.   FAMILY HISTORY:   Father died of heart disease, mother died of strokes, son  has sleep apnea.   PHYSICAL EXAMINATION:  VITAL SIGNS:  Blood pressure 96/56, heart rate 78,  respirations 22, temperature 97, saturation 94% on 2 liters.  Heart rhythm  on monitor now shows somewhat irregular rhythm with very poorly defined P  waves.  GENERAL:  She is bright, alert, oriented, recognized me, talking on the  telephone as I entered the room.  SKIN:  Exposed arms and neck are clear.  ADENOPATHY:  None found of the neck, shoulders or axillae.  HEENT:  Oral mucosa clear.  No neck vein distention or stridor.  CHEST:  Mild end expiratory rhonchi right mid axillary line.  No  consolidation.  No cough.  HEART:  No murmur or gallop heard.  ABDOMEN:  Soft, obese, bowel sounds present.  EXTREMITIES:  No edema or cyanosis.   LABORATORY DATA:  WBC has risen from 8,900 to 17,900.  This may be a  response to steroids or to acute infection.   RADIOLOGY:  Minor patchy shadows consistent with atelectasis in the right  base on most recent film I could find  from September 30.  Not in congestive  failure.   IMPRESSION:  Exacerbation of COPD, unclear how much of her dyspnea is  cardiogenic and how much is related to change in her pulmonary status.  Recognized additional chest wall discomfort from zoster may be contributing  to the extent that bronchodilators may be complicating heart rhythm  management.  One choice is to switch her to Xopenex.  On examination, she  looks as if she is feeling somewhat better and I think it is reasonable to  try standard metered inhaler therapy recognizing that the delivered dose  will be lower than by nebulizer.  If she maintains pulmonary function  adequately with this, it may be satisfactory alternative with less cardiac  stimulation.  If necessary, we will go on Xopenex.  I have written for  replacement of nebulizer with a Foradil dose b.i.d. continuing Pulmicort.  When appropriate, we will  want to take another chest x-ray to clear about  what is going on in the right lower lung zone.  There may be a  bronchitis/bronchial pneumonia component or this may just be atelectasis.  I  agree with the increase in prednisone temporarily, weaning back down as  tolerated.       CDY/MEDQ  D:  01/28/2004  T:  01/28/2004  Job:  [id number]   cc:   [name], M.D.  [address]  [place]  [place] [address]  Fax: [phone]

## 2010-09-12 NOTE — H&P (Signed)
NAME:  [name], [name]                           ACCOUNT NO.:  [id number]   MEDICAL RECORD NO.:  [id number]                   PATIENT TYPE:  INP   LOCATION:  0375                                 FACILITY:  [facility]   PHYSICIAN:  Veverly Fells. Altheimer, M.D.          DATE OF BIRTH:  1924/06/12   DATE OF ADMISSION:  11/11/2003  DATE OF DISCHARGE:                                HISTORY & PHYSICAL   REASON FOR ADMISSION:  Recurrent near syncopal episodes for eight days.   HISTORY:  This is a 77 year old woman with multiple medical problems  including diabetes, diabetic peripheral neuropathy, hypertension, status  post coronary artery bypass surgery in 2000, and asthma.  She had onset  about eight days ago of feeling frequent episodes of progressive graying  of her vision quickly followed be feeling like I would blackout.  This was  occurring nearly every time she stood up.  The symptoms would resolve  quickly with laying down.  She tried to clean her hardwood floors, but found  that she needed to stop and rest every 10 minutes.  She did not have any  true syncope.  She did not have any falls.   She also complains of about a one week history of generalized fatigue and  weakness, blurring of her vision, and nonspecific dizziness.  She checked  her blood pressures on November 05, 2003 and reports that it was 150/72 sitting  and 96/58 laying down.  She skipped her Cardura after calling Dr. Lucianne Muss on  July 11, but otherwise has had no recent changes in her blood pressure  medications.  She has not had any symptoms recently of infectious illnesses  but she does report some recent increase in her chronic asthma symptoms,  especially the past week.  She reports good fluid intake.  She states that  her symptoms are different from her occasional hypoglycemic symptoms.  She  has occasional episodes of her heart racing and skipping but that has been  unchanged in the past week and without evident relation to  her near syncopal  episodes.  She denies any significant history of near syncopal episodes  prior to the past eight days.  She denies any chest pain ever, stating that  she did not have any chest pain even prior to her coronary artery bypass  surgery.   PAST MEDICAL HISTORY:  1. Diabetes mellitus type 2 diagnosed 1988, insulin-requiring, in good     control with A1C 6.4% in June.  2. Dyslipidemia.  3. Obesity.  4. Hypertension which evidently has been difficult to manage, at least in     the sense that she has had many blood pressure medications discontinued     in the past for reasons that she cannot specifically recall.  5. Hypothyroidism.  6. Atherosclerotic heart disease status post coronary artery bypass surgery     in 2000.  Her cardiologist is Daisey Must, M.D.  7. Diabetic neuropathy with marked decreased sensation in the lower     extremities and some history of paresthesias.  8. Chronic obstructive pulmonary disease and asthma.  She quit smoking in     1986.  She has previously seen Sandrea Hughs, M.D.  9. Status post reconstructive jaw surgery.  10.      Status post herniorrhaphy.  11.      Gastroesophageal reflux disease.  12.      Chronic anemia, controlled.  13.      She has numerous drug sensitivities including Niacin, Normodyne,     Bextra, Lipitor, Quinamm, and Trileptal.  In addition, Dr. Remus Blake record     reflects numerous discontinued medications, although it is unclear     whether any or all of these represent specific drug sensitivities.  These     discontinued medications include Actos, Capozide, Avandia, Tiazac,     Normodyne, Vaseretic, Diovan, labetalol, Zocor, Lipitor, Pravachol,     Vasotec, Atrovent, Pulmicort, Welchol, Zetia, Atacand, Lescol XL, Cozaar,     Tricor, Advair.  Again, I have no specific information as to why any of     those were discontinued and the patient cannot give me any of that     history.   CURRENT MEDICATIONS:  1. Glucotrol  XL 10 mg daily.  2. Lantus 37 units daily.  3. Humalog pen 10 units before breakfast, 10 units before lunch, and 5 units     before supper which she adjusts some.  4. Aspirin 81 mg daily.  5. Cardura 4 mg b.i.d.  6. Micardis 80 mg daily.  7. DynaCirc CR 5 mg daily.  8. Synthroid 0.2 mg daily.  9. Albuterol inhaler p.r.n.  10.      Multiple vitamin daily.  11.      B12 1000 mcg injection every two weeks.  12.      Ambien 10 mg h.s. p.r.n. sleep.  13.      Nexium 40 mg daily.  14.      Tussin DM p.r.n.  15.      Neurontin 600 mg h.s. p.r.n. which has not helped significantly     with her neuropathy symptoms, but apparently has helped with sleep.   FAMILY HISTORY:  Positive for diabetes, hypertension, and atherosclerotic  heart disease.   SOCIAL HISTORY:  She lives with her husband.  She quit smoking in 1986.  She  reports that she usually has been quite active up until the past week.   REVIEW OF SYSTEMS:  As detailed above.  She also notes occasional nausea,  chronic tendency toward loose stools alternating with constipation.  She is  also quite troubled by chronic muscle cramps which were not significantly  helped by a previous trial of Baclofen.   PHYSICAL EXAMINATION:  GENERAL:  Alert, pleasant, cooperative 77 year old  white female in no acute distress.  VITAL SIGNS:  O2 saturation was 92% in the emergency room.  Orthostatic  blood pressures as recorded in the emergency room were 139/59 laying, 156/83  sitting, and 131/75 standing with heart rates 85, 82, and 83 respectively.  On my examination blood pressure is 162/90 supine with heart rate 72, 150/84  sitting with heart rate 72, and 140/84 standing with heart rate 84.  She  became mildly woozy standing and after a minute or two she felt the need to  sit back down on the side of the bed.  SKIN:  Normal.  HEENT:  Normal.  NECK:  Supple without thyromegaly with carotid upstrokes normal without  bruit. LUNGS:  Unlabored and  clear to auscultation.  HEART:  Regular without murmur.  ABDOMEN:  Soft, nontender, no mass, rather obese.  EXTREMITIES:  Pedal pulses are normal with no peripheral edema at all.  NEUROLOGIC:  Profound lower extremity sensory loss with no other evident  focal deficits.   LABORATORIES:  Screening laboratories in the emergency room were normal.   EKG:  Normal sinus rhythm with nonspecific ST-T changes and occasional PAC.  Telemetry thus far shows normal sinus rhythm with occasional PAC.   ASSESSMENT:  1. Recurrent near syncopal episodes for the past eight days.  Suspect that     these may be multifactorial.  Suspect that the predominant issue may be     orthostatic hypotension (probable autonomic diabetic neuropathy),     although she only dropped to 140/84 standing on my examination.  Perhaps     her blood pressure measurements underestimate the cerebral blood flow     with standing.  Polypharmacy perhaps is an issue, at least in regard to     her blood pressure medications and perhaps the Neurontin if she is still     taking that, although she really is not on a lot else that would tend to     contribute to orthostasis.  It is also possible that her diabetic     peripheral neuropathy is contributing given the severe sensory loss.     Also need to consider arrhythmia, although there is no clear association     between her occasional palpitations which are chronic and her orthostatic     symptoms this past week.  It is also unclear whether her current symptoms     are related to her recent increase in asthma symptoms.  Overall, even     putting all of this together, there is no clear explanation for the     rather abrupt onset of these symptoms eight days ago and their very     persistent nature.  2. Diabetes mellitus type 2, insulin-requiring.  3. Dyslipidemia.  4. Obesity.  5. Hypertension.  6. Hypothyroidism.  7. Atherosclerotic heart disease status post coronary artery bypass  surgery     2000.  8. Severe diabetic peripheral neuropathy.  9. Chronic obstructive pulmonary disease and asthma with recent     exacerbation.  10.      Gastroesophageal reflux disease.   PLAN:  Will observe closely today on telemetry and check additional  orthostatic blood pressures.  Will hold her blood pressure medications  today.  Would probably have her stay off of the Cardura long-term.  If her  symptoms persist despite decreasing her blood pressure medications, she will  need a full evaluation probably to include cardiology reconsultation,  carotid Dopplers, possibly brain MRI, possibly tilt table testing, etc.  Will observe her initial course over the next 18-24 hours before proceeding  with that level of evaluation.  The patient was advised of these impressions  and plans in detail.                                               Veverly Fells. Altheimer, M.D.    MDA/MEDQ  D:  11/11/2003  T:  11/11/2003  Job:  [id number]  cc:   Charlaine Dalton. Sherene Sires, M.D. [facility] W.  Pulsipher, M.D. [facility]

## 2010-09-12 NOTE — Discharge Summary (Signed)
NAMEKARRISA, DIDIO                 ACCOUNT NO.:  [id number]   MEDICAL RECORD NO.:  [id number]          PATIENT TYPE:  INP   LOCATION:  4706                         FACILITY:  [facility]   PHYSICIAN:  [name], M.D.       DATE OF BIRTH:  14-Jul-1924   DATE OF ADMISSION:  01/26/2004  DATE OF DISCHARGE:  01/30/2004                                 DISCHARGE SUMMARY   HISTORY:  The patient was admitted through the emergency room for shortness  of breath.  She has known history of asthma and COPD, and was unable to get  comfortable with her breathing at home, despite taking nebulizers.   MEDICATIONS/PAST HISTORY/REVIEW OF SYSTEMS:  See HPI.   PHYSICAL EXAMINATION:  Physical examination showed clinically stable obese  woman.  Only abnormal findings were bilateral rhonchi in the lungs and a  rash consistent with shingles on the abdomen on the left side, but also  decreased sensation in the lower legs.   HOSPITAL COURSE:  The patient was admitted and treated with nebulizers and  oxygen, as well as antibiotics.  Cardiology was consulted because of  bradycardia and possible atrial fibrillation.  An echocardiogram was ordered  as well as enzymes.  She was initially started on IV heparin.  The patient's  dyspnea improved and bradycardia resolved.  Blood sugars were high because  of steroids.  Prednisone was tapered.  Her blood pressure medicines were  held on the 3rd because of low blood pressure and her Avelox was changed to  Ketek orally.   Pulmonary consultation was done with Dr. Joni Fears D. Young, who agreed for  tapering the prednisone and using Xopenex.   The patient was weaned off the oxygen and vital signs improved.  Cardiology  felt that she did not really have atrial fibrillation and no further  recommendations made.  Her oxygen saturation remained between 93% and 98% on  room air.   She was stable and much improved overall on the 5th, and was discharged.   EKG showed heart rate of  85 with no abnormality.  Chest x-ray showed mild  patchy right basilar atelectasis.   LABORATORIES:  Her PCO2 was 47 on admission.  WBC was 17,900 on the 2nd of  October.  Glucose 153 on admission.  Cardiac enzymes and liver functions  were normal.  BNP was 191 and repeat on January 27, 2004 was 56.  TSH was  0.9.   DISCHARGE DIAGNOSES:  1.  Exacerbation of chronic obstructive pulmonary disease and probable mild      congestive heart failure.  2.  Acute bronchitis.  3.  Herpes zoster.  4.  Diabetes with neuropathy.  5.  Hypothyroidism.   DISCHARGE CONDITION:  Improved.   ADDENDUM:  The patient's echocardiogram showed normal LV function, 55% to  65% ejection fraction, wall motion was normal, LV wall thickness mildly  increased; there was also mild RVH with mild dilatation of right atrium and  left atrium, and mild thickening of the aortic valve.   DISCHARGE MEDICATIONS:  1.  Prednisone 10 mg for 3 days.  2.  Pulmicort twice a day.  3.  Foradil inhaler.  4.  Nebulizer machine -- to use Xopenex p.r.n.  5.  Complete azithromycin at home.  6.  To resume all other medications including insulin, Actos, Glucotrol,      Micardis, Synthroid, Lyrica and Vicodin p.r.n.   FOLLOWUP:  Follow up in the office in 1 week and with Dr. Maple Hudson next week.       AK/MEDQ  D:  02/29/2004  T:  03/01/2004  Job:  [id number]   cc:   Joni Fears D. Young, M.D.  [address] [place]  [place] [address]  Fax: [phone]   Vida Roller, M.D.  Fax: [phone]

## 2010-09-12 NOTE — Assessment & Plan Note (Signed)
[facility]                             PULMONARY OFFICE NOTE   NAME:[name] [name] BLACKHAM                        MRN:          [id number]  DATE:05/20/2006                            DOB:          [birth year]    PROBLEMS:  1. Asthmatic bronchitis/chronic obstructive pulmonary disease.  2. Musculoskeletal chest wall pain.  3. Coronary disease/bypass/congestive failure (Dr. Diona Browner).  4. Diabetes.  5. Hypothyroid.  6. Gallbladder disease.  7. von Willebrand's disease, preventative Coumadin use for paroxysmal      atrial fibrillation.  8. Pulmonary hypertension.  9. Pernicious anemia.   HISTORY:  Last year, about 2 weeks ago, she had pulmonary function tests  as scheduled and now comes to followup on that, expressing a desire to  be considered again for gall bladder surgery, because of persistent  nausea and reflux. She had originally seen Dr. Claud Kelp, who was  reluctant to operate, because of her health problems. She went to  [facility], and was seen there, but would like a second surgical opinion  here, and she would be interested in getting Dr. Jacinto Halim opinion again.  She has felt that her breathing was stable with no chest pain and no  acute events. She uses her metered inhaler each night at bedtime and  occasionally uses her nebulizer machine. Her exercise tolerance is  limited, but stable, and she can get through her activities of daily  living. She is not on oxygen.   MEDICATIONS:  1. Glucotrol XL 10 mg.  2. Micardis 80 mg.  3. Multivitamins.  4. Lantus.  5. Vitamin B-12.  6. Doxazosin 4 mg.  7. Synthroid.  8. Cymbalta 30 mg.  9. Niacin.  10.Ambien 10 mg.  11.Albuterol inhaler.  12.Home nebulizer with Xopenex 1.25 mg, up to t.i.d.  13.Home nebulizer with Pulmicort 0.25 mg, by nebulizer b.i.d.  Drug intolerant of BIAXIN, CEFACLOR, CARBAMAZEPINE, DICYCLOMINE, SULFA.   OBJECTIVE:  VITAL SIGNS: Weight 212, blood pressure 122/72, pulse  89,  room air saturation 94%.  GENERAL: She seems alert and comfortable, overweight, ambulatory.  HEART: Sounds are regular. I do not hear murmur or gallop.  CHEST: Quiet, unlabored, with no rales or wheeze.  EXTREMITIES: No peripheral edema.   PFT:  Moderate obstructive airways disease based on FEV1/FEC ratio. Her  FEV1 was actually 1.65 liters, or 92% of predicted. There was small  response to bronchodilator. Lung volumes were normal. Diffusion was  mildly reduced at 68% of predicted. On a 6 minute walk test, she went  396 meters in 6 minutes on room air. Blood pressure rose from 134/80 to  160/90. 2 minutes after ending the walk, blood pressure was 150/84.  Heart rate rose from 89 to 106, and 2 minutes after ending was 93. Room  air oxygen saturation was 95% at the beginning of exertion, 94% at the  end, and 95% 2 minutes later, indicating little change. She completed  the test without stopping, but did complain of some back ache. This is  seen as a fairly good exercise tolerance with hypertensive response to  exertion, normal heart  rate response and well maintained oxygen  saturation.   IMPRESSION:  Moderate chronic obstructive pulmonary disease with a small  reversible component and fair exercise tolerance on room air. She is  currently stable from a pulmonary standpoint. Her multiple  cardiovascular and plotting problems would be more of a question for  proposed surgery than her pulmonary status at this time. We are planning  for me to see her again in 6 months, earlier, p.r.n.     [name] D. Maple Hudson, MD, [name] [name], FACP  Electronically Signed    CDY/MedQ  DD: 05/20/2006  DT: 05/21/2006  Job #: [id number]   cc:   [name] [name], M.D.  [name] L. Malon Kindle., M.D.  [name] [name], M.D.  [name] [name], M.D.

## 2010-09-12 NOTE — Consult Note (Signed)
NAME:  [name], [name]                           ACCOUNT NO.:  [id number]   MEDICAL RECORD NO.:  [id number]                   PATIENT TYPE:  INP   LOCATION:  0356                                 [facility]ACILITY:  [facility]   PHYSICIAN:  Pendleton Bing, M.D.               DATE OF BIRTH:  [birth year]   DATE OF CONSULTATION:  11/14/2003  DATE OF DISCHARGE:                                   CONSULTATION   PRIMARY CARE PHYSICIAN:  Veverly Fells. Altheimer, M.D.   PRIMARY CARDIOLOGIST:  Carole Binning, M.D. [facility]   HISTORY OF PRESENT ILLNESS:  A 77 year old woman referred for episodes of  orthostatic dizziness.  [name] has known coronary artery disease,  having undergone coronary artery bypass graft surgery in 2000.  Her office  records have been requested, but are not yet available.  She has done well  from a cardiac standpoint since that time.  In recent weeks, she describes  that episodes that sound like classic orthostatic hypotension.  She arises  from a lying or sitting position and notes dizziness, perhaps some vertigo,  followed by graying of her vision.  She has never completely lost  consciousness nor fallen.  She lies down or sits down with resolution of her  symptoms.  With further questioning, there is some inconsistency.  She notes  that she will not let herself sit around.  As a result, she has continued  to do housework and other activities that require considerable walking or  standing.  She has an unstable gait, but was able to get to the bathroom by  herself last night.  Since admission, she notes no improvement in her  symptoms.  She has had multiple orthostatic blood pressures measured with  some change from lying to standing, but no significant hypotension and not  enough difference to account for her symptoms.  She does not believe that  sudden motion of her head causes any problems.  She does have history of  vertigo and inner ear problems.   Also noted is generalized  weakness.  She has had some palpitations, but  these have not necessarily occurred during lightheadedness.   PAST MEDICAL HISTORY:  Notable for diabetes requiring insulin therapy,  dyslipidemia, obesity, hypertension, hypothyroidism, diabetic neuropathy,  chronic obstructive pulmonary disease, and gastroesophageal reflux disease.  She has had anemia in the past.  Surgeries have included reconstructive jaw  surgery and herniorrhaphy.   Allergic history is uncertain.  The patient describes multiple drug  sensitivities, which are documented on the chart.   RECENT MEDICATIONS:  1. Glucotrol 10 mg daily.  2. Lantus insulin 37 units daily.  3. Humulin before meals, total of 25 mg daily.  4. Aspirin 81 mg daily.  5. Cardura 4 mg b.i.d.  6. Micardis 80 mg daily.  7. DynaCirc 5 mg daily.  8. Levothyroxine 0.2 mg daily.  9. Albuterol inhaler.  10.  B12 injections.  11.      Ambien p.r.n.  12.      Nexium 40 mg daily.  13.      Neurontin 600 mg q.h.s. p.r.n.   She insists that she has been taking Levothyroxine daily.  Blood pressure  medicines have been held since hospital admission without significant  hypertension.   FAMILY HISTORY:  Positive for coronary disease.   SOCIAL HISTORY:  Lives with her husband.  Prior cigarette smoking which was  discontinued in 1986.  Generally active including driving.   REVIEW OF SYSTEMS:  Intermittent nausea, probable irritable bowel syndrome  with alternating diarrhea and constipation.  She describes muscle cramps and  paresthesias all over her body.  Other systems reviewed and are negative.   PHYSICAL EXAMINATION:  GENERAL:  Pleasant older overweight woman in no acute  distress.  VITAL SIGNS:  The blood pressure is 120/60 sitting and 130/60 standing.  The  patient reported transient symptoms in both positions.  Heart rate 80 and  regular.  Respirations 16.  O2 saturation 92% on room air.  HEENT:  Normal lids and conjunctivae; anicteric  sclerae; pupils equal, round  and reactive to light.  NECK:  No jugular venous distention.  No carotid bruits.  ENDOCRINE:  No thyromegaly.  HEMATOPOIETIC:  No cervical, axillary or inguinal adenopathy.  LUNGS:  Decreased breath sounds, otherwise clear.  CARDIAC:  Normal first and second heart sounds.  Modest systolic ejection  murmur.  ABDOMEN:  Soft and nontender.  Decreased bowel sounds.  No masses.  No  organomegaly.  EXTREMITIES:  Normal distal pulses except for decreased left dorsalis pedis;  trace edema; mild chronic stasis changes.  NEUROMUSCULAR:  Symmetric strength and tone; decreased sensory in the lower  extremities; cranial nerves intact; no nystagmus.  MUSCULOSKELETAL:  No joint deformities.   Chest x-ray reported as cardiomegaly without acute abnormalities.   EKG:  Normal sinus rhythm; delayed R wave progression; diffuse T wave  flattening.   OTHER LABORATORY:  Notable for a normal chemistry profile, normal CBC, TSH  of 41 with a free T4 of 0.45 and B12 level greater than 2000.   IMPRESSION:  [name] coronary disease appears quiescent.  We will check a  lipid profile, but medications appear appropriate at the present time.  She  is said to have significant hypertension in the past, but this is not  currently evident.  Continued observation off her antihypertensive  medications is appropriate.   Her current symptoms certainly sound as if they represent orthostatic  hypotension, but clearly do not.  Accordingly, I agree with the decision to  discontinue Florinef.  Her problems are more likely neurologic/or  vestibular.  Plans are for a brain MRI.  Carotid ultrasound has been  performed and demonstrates plaques, but no significant focal disease.  She  has been seen by [facility] in the past - reconsultation would be  appropriate.  Hypothyroidism may also be contributing to her current symptoms.  Reason for  this is quite unclear.  She is receiving  adequate replacement doses.  Of  course, Dr. Lucianne Muss will be addressing this issue.   We greatly appreciate the request for consultation and will be happy to  follow this woman with you as needed in hospital and to continue to see her  as an outpatient.  [employer], M.D.    RR/MEDQ  D:  11/14/2003  T:  11/14/2003  Job:  [id number]

## 2010-09-12 NOTE — Discharge Summary (Signed)
[name], LARSEN                 ACCOUNT NO.:  [id number]   MEDICAL RECORD NO.:  [id number]          PATIENT TYPE:  INP   LOCATION:  5151                         FACILITY:  [facility]   PHYSICIAN:  [name], M.D.       DATE OF BIRTH:  26-May-1924   DATE OF ADMISSION:  01/19/2004  DATE OF DISCHARGE:  01/21/2004                                 DISCHARGE SUMMARY   HISTORY OF PRESENT ILLNESS:  The patient was admitted with increasing  dyspnea at rest, which had progressed despite being treated in the emergency  room at [facility] two days prior.  The patient was also having an  intractable cough which was mostly dry.  She had previously had asthmatic  bronchitis, and has been on appropriate treatment for this.   PHYSICAL EXAMINATION:  GENERAL:  Moderate dyspnea without cyanosis.  Positive saturation 95%-98% with oxygen.  LUNGS:  Bilateral wheezing.  EXTREMITIES:  There was 1+ edema present.  ABDOMEN:  Slightly distended.  HEENT:  No other abnormalities on examination except slight dryness of the  mucous membranes.   HOSPITAL COURSE:  The patient was given IV Solu-Medrol.  She was started on  q.i.d. inhalers and low-dose Lasix.  The patient is normally intolerant to  Lasix because of muscle cramps.  The patient gradually improved.  On  January 11, 2004, was comfortable without oxygen.  She had still some  complaints of pleuritic left chest pain and some edema.   LABORATORY DATA:  Potassium 3.5, glucose 67 on admission.  Blood sugars by  CBG in the 200's on the day before discharge.  CBC otherwise normal.  Blood  gas on oxygen showed a pH of 7.417, pO2 of 78, pCO2 normal at 43.  A chest x-ray showed interstitial edema with pleural scarring and somewhat  enlarged heart.  The rib series also did not show any other abnormalities.  Electrocardiogram showed occasional PAC's.   DISCHARGE DIAGNOSES:  Exacerbation of chronic obstructive pulmonary disease  with probable mild congestive  heart failure.   CONDITION ON DISCHARGE:  Improved.   DISCHARGE MEDICATIONS:  1.  Continue home medications.  2.  Avelox 400 mg daily for the next five days.  3.  Increase Lantus dose for higher sugars, between 44 and 50 units.  4.  Lasix 20 mg daily.  5.  Continue inhalers and p.r.n. albuterol p.r.n.  6.  Vicodin p.r.n. pain.   FOLLOWUP:  1.  To follow up in the office in two weeks.  2.  Follow up with Dr. Joni Fears D. Young as directed.       AK/MEDQ  D:  01/21/2004  T:  01/21/2004  Job:  [id number]   cc:   Joni Fears D. Young, M.D.  [address] [place]  [place] [address]  Fax: [phone]

## 2010-09-12 NOTE — H&P (Signed)
[name], [name]                 ACCOUNT NO.:  [id number]   MEDICAL RECORD NO.:  [id number]          PATIENT TYPE:  INP   LOCATION:  4702                         FACILITY:  [facility]   PHYSICIAN:  Alfonse Alpers. Gegick, M.D.DATE OF BIRTH:  1924/10/23   DATE OF ADMISSION:  01/25/2004  DATE OF DISCHARGE:                                HISTORY & PHYSICAL   REASON FOR ADMISSION:  This is a 77 year old woman who presents to the  emergency room with a history of increasing shortness of breath.   HISTORY OF PRESENT ILLNESS:  The patient was recently discharged from the  hospital approximately four days prior to this admission.  At that time, she  had an episode of bronchitis with exacerbation of her chronic obstructive  pulmonary disease.  She was treated with prednisone and also Avelox and was  discharged from the hospital and has continued to have purulent sputum and  her shortness of breath has become increasingly difficulty.  She has also  noted that her heart is beating faster.  In the emergency room, she had  arterial gases done and her pO2 was 90, pCO2 was 47.   She has a history of heart failure in the past.  This was associated with  preserved systolic function, questionable.  In the emergency room, a BNP was  191.  She was also noted to have a pleural effusion on her chest x-ray.  Her  creatinine was 1.1.  Sodium was 137.   She has a history of type 2 diabetes requiring insulin.  This has been  present since 1988.  She has had relatively poor control of her diabetes  using large amounts of insulin, Lantus insulin 40 units and Humalog 10 units  before meals.  She has had severe neuropathy associated with this and has no  perception from her knees down.   She has a history of recent onset of herpes zoster.  This has effected the  T12 area.  She has been taking Famvir for this. The amount of time that she  has taken this is questionable.  She probably has taken for one or two days.  She also has a history of dyslipidemia and apparently has had difficulty  tolerating medications with this.  She has a history of hypothyroidism.  She  has required large doses of Synthroid replacement.  Most recently, she has  been taking 0.2 mg.  There is perhaps some possibility that some error is  present in her taking of the thyroid.   She has a history of severe neuropathy from her diabetes from her knees  down.  She has been taking Lyrica for this.  Previously, she was taking  Neurontin.   PAST MEDICAL HISTORY:  In addition to the above-problems noted above, she  has had a history of arteriosclerotic heart disease and required a coronary  artery bypass graft in the year 2000.   MEDICATIONS:  1.  Lantus insulin 40 units q.d.  2.  Glucotrol XL 10 mg q.d.  3.  Humalog 10 units before breakfast, lunch, and supper.  4.  Cardura 4 mg q.d.  5.  Micardis 80 mg q.d.  6.  DynaCirc 5 mg q.d.  7.  Synthroid 0.25 mg q.d.  8.  Pulmicort b.i.d.  9.  She has also be taking vitamin B12.  10. Lyrica 75 mg b.i.d.  11. Ultram recently switched to Darvocet.   SOCIAL HISTORY:  She does not smoke.   ALLERGIES:  There is some question of allergies to NIACIN, NORMODYNE,  BEXTRA, LIPITOR, QUINAMM, and TRILEPTAL.   REVIEW OF SYMPTOMS:  Her weight has been stable.  CARDIOVASCULAR/RESPIRATORY:  See above.  No history of chest pain.  GASTROINTESTINAL:  She has recently been receiving Nexium.  GENITOURINARY:  No complaints.   PHYSICAL EXAMINATION:  GENERAL:  This is a well-developed, obese woman who  appears clinically stable.  HEENT:  Normocephalic.  NECK:  Supple.  Thyroid is not enlarged; however, palpation was difficult.  BREASTS:  No masses were present.  CARDIOVASCULAR:  Present.  LUNGS:  Rhonchi present bilaterally.  ABDOMEN:  Soft.  She has the rash extending to the umbilicus on the left  side consistent with shingles.  No hepatomegaly is present.  No tenderness  is present.   EXTREMITIES:  No pedal edema is present.  NEUROLOGICAL:  She has complete absence of sensation from the knees down.   IMPRESSION:  1.  Chronic obstructive pulmonary disease with exacerbation.  2.  History of heart failure with preserved systolic function.  3.  Diabetes mellitus type 2 requiring insulin.  4.  Herpes zoster T12 on the left.  5.  Hypothyroidism on replacement.  6.  Dyslipidemia.  7.  History of anemia not present now.  8.  Severe neuropathy from diabetes.  9.  History of arteriosclerotic heart disease.       CGG/MEDQ  D:  01/26/2004  T:  01/26/2004  Job:  [id number]

## 2010-09-12 NOTE — Consult Note (Signed)
NAMESALLE, BRANDLE                 ACCOUNT NO.:  [id number]   MEDICAL RECORD NO.:  [id number]          PATIENT TYPE:  INP   LOCATION:  5151                         FACILITY:  [facility]   PHYSICIAN:  Marcelyn Bruins, M.D. [facility] DATE OF BIRTH:  Jun 27, 1924   DATE OF CONSULTATION:  01/20/2004  DATE OF DISCHARGE:                                   CONSULTATION   HISTORY OF PRESENT ILLNESS:  The patient is a 77 year old white female with  a history of COPD who presents with a five day history of worsening  shortness of breath and cough.  The patient had scant mucous but did  describe it as green and sticky on rare occasions.  She was seen in the  emergency room last week and placed on antibiotics and steroids.  She  continued to have shortness of breath and came to the ER last night for  evaluation.  She was then admitted by Dr. Lucianne Muss with COPD exacerbation  probably secondary to acute asthmatic bronchitis.  The patient denies  classic pleuritic chest pain and has had no hemoptysis.  She has had no  fevers, chills or sweats.  She does have a history of GERD but she is not  symptomatic currently.  She feels that she is better since being on  appropriate antibiotics, steroids and bronchodilators.   PAST MEDICAL HISTORY:  1.  Organic heart disease.  She is status post CABG.  Assessment of her left      ventricular function is not available.  2.  History of COPD with questionable asthma.  3.  History of diabetes.  4.  History of hypertension.  5.  History of hypothyroidism.  6.  History of dyslipidemia.  7.  History of peripheral neuropathy.   ALLERGIES:  1.  ZOCOR.  2.  LIPITOR.  3.  SULFA.  4.  CIPRO.  5.  BENTYL.   SOCIAL HISTORY:  She denies significant tobacco use and has not smoked in 17  years.   FAMILY HISTORY:  Positive for diabetes, coronary disease, hypertension.   REVIEW OF SYMPTOMS:  As per history of present illness.   PHYSICAL EXAMINATION:  GENERAL APPEARANCE:  She is an  obese white female in  no acute distress.  VITAL SIGNS:  Blood pressure 129/69, pulse 84, respiratory rate  approximately 20, she is afebrile.  O2 level is adequate on oxygen.  HEENT:  Pupils are equal, round, reactive to light and accommodation.  Extraocular muscles are intact.  Nares are patent without discharge.  Oropharynx clear.  NECK:  Supple without lymphadenopathy or thyromegaly.  It was very difficult  to assess for JVD because of its size.  CHEST:  Poor depth of inspiration but adequate air flow bilaterally with no  wheezes or rhonchi.  CARDIOVASCULAR:  Mildly tachycardic, regular rhythm with 2/6 systolic  murmur.  ABDOMEN:  Soft and nontender with good bowel sounds.  GENITAL/RECTAL/BREASTS:  Examinations not done and not indicated.  EXTREMITIES:  Lower extremity shows trace edema.  Pulses are diminished but  present and symmetrical.  NEUROLOGIC:  The patient is alert and oriented,  can move all four  extremities.   LABORATORY DATA:  Chest x-ray showed hyperinflation with question raised of  mild interstitial edema.  Brain natriuretic peptide approximately 250.   IMPRESSION:  1.  History of chronic obstructive pulmonary disease and questionable acute      asthmatic bronchitis is the source of her worsening shortness of breath.      The patient sounds very good currently and is in no respiratory      distress.  I agree with empiric treatment with steroids, antibiotics and      bronchodilators, but I wonder if her shortness of breath is due to      something else, possibly cardiac.  Also wonder if her cough is due to      laryngopharyngeal reflux with her history and obesity.  She had very      little mucous production and clearly has a barking cough.  2.  History of coronary artery bypass grafting.  Agree with follow-up echo      since it is unclear how much of this could be due to her cardiac      function with her abnormal chest x-ray.   SUGGESTIONS:  1.  Continue  albuterol and Atrovent nebulizers.  2.  Agree with aggressive taper of steroids in the absence of wheezing and      the presence of diabetes.  3.  Deep venous thrombosis prophylaxis with Lovenox.  4.  Trial of b.i.d. proton pump inhibitor.  5.  Follow-up echo.       KC/MEDQ  D:  01/20/2004  T:  01/21/2004  Job:  [id number]   cc:   Joni Fears D. Young, M.D.  [address] [place]  [place] [address]  Fax: [phone]

## 2010-09-12 NOTE — Op Note (Signed)
NAME:  [name], [name]                           ACCOUNT NO.:  [id number]   MEDICAL RECORD NO.:  [id number]                   PATIENT TYPE:  AMB   LOCATION:  ENDO                                 FACILITY:  [facility]   PHYSICIAN:  James L. Malon Kindle., M.D.          DATE OF BIRTH:  1924-10-27   DATE OF PROCEDURE:  09/20/2003  DATE OF DISCHARGE:                                 OPERATIVE REPORT   PROCEDURE:  Esophagogastroduodenoscopy and biopsy.   MEDICATIONS GIVEN:  DDAVP 25 mcg IV preop, Fentanyl 50 mcg, Versed 6 mg IV.   INDICATIONS FOR PROCEDURE:  Epigastric pain despite the use of double dose  PPI.  The patient has von Willebrand's disease and we had discussed this  previously with Dr. Darnelle Catalan as per her previous colonoscopy and gave her  DDAVP preop.   DESCRIPTION OF PROCEDURE:  The procedure was explained to the patient and  consent obtained.  With the patient in the left lateral decubitus position,  the scope was inserted and advanced.  The stomach was entered, pylorus  identified and passed.  The duodenum including the bulb and second portion  was seen well and was unremarkable.  The scope was withdrawn back into the  stomach.  The pyloric channel and the antrum were normal.  The fundus and  cardia were seen on the retroflex view and were normal.  In the retroflex  view, the patient was seen to have 2-3 polyps 4-5 mm in diameter up in the  fundus and were biopsied.  The GE junction was widely patent and there was  evidence of free reflux with a red distal esophagus.  The proximal esophagus  was endoscopically normal.  The scope was withdrawn.  The patient tolerated  the procedure well and there were no immediate problems.   ASSESSMENT:  1. Gastric polyps, biopsied, 211.1.  2. Gastroesophageal reflux disease, 530.01.   PLAN:  Will check path and recommend an office visit in 2-3 months.  Go  ahead with the colonoscopy at this time and give a reflux sheet.                              James L. Malon Kindle., M.D.    [name]  D:  09/20/2003  T:  09/20/2003  Job:  [id number]   cc:   [name], M.D.  [address]  [place]  [place] [address]  Fax: [phone]   Pershing Cox, M.D.  [address]  [place]  [place] [address]  Fax: [phone]   Valentino Hue. Magrinat, M.D.  [address] [facility]  [place]  [place] [address]  Fax: [phone]

## 2010-09-12 NOTE — Discharge Summary (Signed)
[name], [name]                 ACCOUNT NO.:  [id number]   MEDICAL RECORD NO.:  [id number]          PATIENT TYPE:  INP   LOCATION:  5020                         FACILITY:  [facility]   PHYSICIAN:  [name], M.D.       DATE OF BIRTH:  09/23/1924   DATE OF ADMISSION:  07/24/2006  DATE OF DISCHARGE:  07/28/2006                               DISCHARGE SUMMARY   HISTORY OF PRESENT ILLNESS:  This 77 year old patient was admitted with  shortness of breath.  This was preceded by three day history of cough,  fever, and greenish thick sputum.  Because of the marked dyspnea the  patient was brought to the emergency room and admitted.  The patient's  medications, Past History, Review of Systems, Family History, see HPI.   PHYSICAL EXAMINATION:  VITAL SIGNS:  Blood pressure was 88/45,  temperature normal.  GENERAL:  The patient was alert and cooperative.  Her systemic exam was  remarkable only for decreased breath sounds with prolonged expiration.   She was also having some bilateral wheezing on admission.   HOSPITAL COURSE:  The patient was given nebulizer treatments, oxygen.  Started on IV Rocephin.  Insulin dosage was increased, and she had a  dose of steroids in the emergency room.  The patient was continued on  low dose prednisone for a couple days, and her Rocephin was switched to  oral Avelox.  The patient's respiratory distress and wheezing improved,  and she was ready for discharge.   LABORATORY DATA:  Significant labs:  Her CBC was normal.  Sodium 134 on  admission and repeat was 139.  TSH was 0.43.  Her blood cultures were  negative.  Her chest x-ray showed bronchitic changes, and thoracolumbar  spine x-ray showed only mild to moderate degenerative changes.  Pulmonary functions were ordered by Dr. Maple Hudson, but I do not see a report  in the chart.   DISCHARGE DIAGNOSES:  1. Acute bronchitis with exacerbation of chronic obstructive pulmonary      disease most likely viral in  origin.  2. Diabetes.  3. Hypertension.  4. History of hyperlipidemia.  5. History of coronary artery disease.   DISCHARGE CONDITION:  Improved.   DISCHARGE INSTRUCTIONS:  The patient's medication list was prepared, and  she was discharged on her usual medications along with Avelox for  another three days and Mucinex DM for cough.           ______________________________  [name], M.D.     AK/MEDQ  D:  07/28/2006  T:  07/28/2006  Job:  [id number]   cc:   Maple Hudson, M.D.

## 2010-09-12 NOTE — Procedures (Signed)
[facility]  Patient:    [name], [name]                        MRN: [id number] Proc. Date: 06/08/00 Adm. Date:  [id number] Attending:  Orland Mustard CC:         Pershing Cox, M.D.  [name], M.D.  Valentino Hue. Magrinat, M.D.   Procedure Report  PROCEDURE:  Colonoscopy and coagulation of polyps.  ENDOSCOPIST:  Llana Aliment. Randa Evens, M.D.  MEDICATIONS:  Fentanyl 37.5 mcg, Versed 6 mg, DDAVP 25 mcg preoperatively.  INDICATIONS:  The patient had previous colonoscopy done with questionable small cecal polyp.  Due to [facility] macroglobulinemia, this was not coagulated or biopsied.  After discussion with Dr. Darnelle Catalan it was recommended that she receive the DDAVP prior to colonoscopy which was what was done.  SCOPE:  Pediatric colonoscope.  DESCRIPTION OF PROCEDURE:  The procedure had been explained to the patient and consent obtained.  The patient was placed in the left lateral decubitus position.  The Olympus pediatric video colonoscope was inserted and advanced under direct visualization.  The prep was very marginal and parts of the colon were very dirty.  Small polyps certainly could have been missed.  The patient was hypotensive with systolic pressure of 70-90 for unclear reasons.  It did come up persistently in the 80s after IV fluids.  We reached the cecum and the ileocecal valve was identified and entered.  The cecum was very dirty.  I irrigated and searched diligently for what was felt to be the cecal polyp prior to this and we were unable to find anything.  The scope was withdrawn in the cecum, ascending colon, hepatic flexure, transverse colon, splenic flexure, descending and sigmoid colons seen well. No polyps were seen but this was a fairly dirty colon.  A small polyp could have been missed.  In the rectum, there were several small sessile polyps that were cauterized and placed in a single jar.  These were probably  diminutive type polyps.  There was anal stenosis as before.  No recent change.  The scope was withdrawn and the patient tolerated the procedure well and was alert.  Again, she was hypotensive throughout the procedure.  This was stable with blood pressures 80 to 90 systolic.  ASSESSMENT: 1. Small rectal polyps cauterized. 2. Anal stenosis.  PLAN:  Will check pathology and see back in our office in six months.  Will give her additional IV fluids in the endo unit. DD:  06/08/00 TD:  06/08/00 Job: [id number]

## 2010-09-12 NOTE — Discharge Summary (Signed)
NAME:  [name], [name]                           ACCOUNT NO.:  [id number]   MEDICAL RECORD NO.:  [id number]                   PATIENT TYPE:  INP   LOCATION:  0356                                 FACILITY:  [facility]   PHYSICIAN:  [name], M.D.                    DATE OF BIRTH:  [birth year]   DATE OF ADMISSION:  11/11/2003  DATE OF DISCHARGE:  11/16/2003                                 DISCHARGE SUMMARY   CHIEF COMPLAINT:  Blacking out episodes.   HISTORY:  Patient states that she has been having the sensation of nearly  blacking out for about eight days.  This would be whenever she would stand  up and be active, and she would experience the sensation of graying of her  vision and a feeling of blacking out.  This would be relieved by lying down.  Patient did not completely pass out or have any other associated  neurological symptoms.  She was also complaining of generalized fatigue,  weakness, and nonspecific dizziness.   For past history, medications, review of systems, see HPI.   Examination in the emergency room revealed she did not have any orthostatic  hypotension with blood pressure of 101/75; however, she did feel symptomatic  with standing up and sensory loss in her lower legs.  The rest of the exam  was essentially normal.   HOSPITAL COURSE:  Patient had frequent orthostatic blood pressures performed  in the hospital.  On July 18, her standing blood pressure was 95/57.  She  was started on Florinef, which improved her blood pressures but not her  symptoms; however, she started complaining of vertigo, dizziness, and  unsteadiness also while getting up, in addition to the graying-out  sensations.   Dr. Dietrich Pates and Dr. Orlin Hilding were consulted.  They could not explain her  symptoms; however, it was noted that her TSH level was high at 40.1 and free  T4 low at 0.45.  She was given an additional 0.05 mg of Synthroid.   The patient was questioned about her thyroid dose at home,  and she claims  that she was taking her 0.2 mg regularly; however, on showing her the  pictures of Synthroid in the PDR, she first identified the 0.025 mg as  opposed to the 0.2 mg dosage.  She states that she usually checks her  prescription bottles for accuracy, and this will need to be further  confirmed.  Patient improved gradually, and when discharged was much better  spontaneously; however, she was complaining of muscle cramps.   LABS:  Cholesterol 259.  TSH and T4 as above.  B12 over 2000.   Her MRI of the brain showed chronic microvascular change, moderate to  advanced, but no acute event seen with mild plaque.   The EKG showed nonspecific T wave abnormality and long QT.   DISCHARGE DIAGNOSIS:  1. Near syncope and weakness  of unknown etiology, possibly related to marked     hypothyroidism.  2. Diabetic neuropathy and orthostatic hypotension, transient in the     hospital.  3. Severe peripheral neuropathy.  4. Hypercholesterolemia.  5. Diabetes.  6. Hypertension.  7. Chronic obstructive pulmonary disease.   DISCHARGE CONDITION:  Improved.   DISCHARGE INSTRUCTIONS:  1. She will take Lantus 38 units.  Novolog will be 8, 6, and 4 at meal time.     Synthroid will be 0.2 and an additional 0.05 mg.  2. She will start back only on a half tablet of Micardis and hold other     blood pressure medications.  3. Resume Glucotrol, albuterol, Neurontin, aspirin, and Baclofen.  4. Follow up in the office in two weeks.                                               [name], M.D.    AK/MEDQ  D:  11/16/2003  T:  11/17/2003  Job:  [id number]   cc:   [place] Bing, M.D.   Catherine A. Orlin Hilding, M.D.  [address]  [address]  [place]  [place] [address]  Fax: [phone]

## 2010-09-12 NOTE — Assessment & Plan Note (Signed)
[facility]                               PULMONARY OFFICE NOTE   NAME:[name], [name]                        MRN:          [id number]  DATE:12/01/2005                            DOB:          04/13/1925    PROBLEM LIST:  1.  Asthmatic bronchitis/chronic obstructive pulmonary disease.  2.  Musculoskeletal chest wall pain.  3.  Coronary artery disease/bypass/congestive failure (Dr. Simona Huh).  4.  Diabetes.  5.  Hypothyroid.  6.  Gallbladder disease.  7.  Von Willebrand's disease preventing Coumadin use for paroxysmal atrial      fibrillation.  8.  Pulmonary hypertension.   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  She has also stopped Nexium.  She says her  breathing has been bad for the last month or so.  Some of it may be the  weather.  She is waking with wheezing and wheezes off and on through the day  coughing up what she says is rubbery, light-green mucus with no fever.  She  sleeps on an adjustable bed with the head elevated and she added an allergy  encasing to the mattress.  She complains her Xopenex nebulizer treatment  taken at bedtime lasts only about 2 hours and we talked about alternatives.   MEDICATIONS:  1.  Glucotrol XL 10 mg.  2.  Micardis 80 mg.  3.  Lantus.  4.  Vitamin B12.  5.  Doxazosin 4 mg.  6.  Synthroid 0.2 mcg.  7.  Humalog.  8.  Fish oil.  9.  Ambien 10 mg.  10. Tussin DM.  11. Albuterol rescue inhaler used occasionally.  12. Home nebulizer with Xopenex 1.25 mg.  13. Pulmicort inhaler which she has not used in the last several months.  We      discussed appropriate use of this.   ALLERGIES:  BIAXIN, CEFACLOR, CARBAMAZEPINE, DICYCLOMINE AND SULFA.   OBJECTIVE:  VITAL SIGNS:  Weight 213 pounds, BP 168/92, pulse regular at 78,  room air saturation 93%.  CHEST:  There  is a mild congested cough noted only at end-expiration,  otherwise chest is really quite clear.  I specifically do not hear rales,  crackles or wheeze and she seems to be able to speak in full sentences  without accessory muscle use.  HEART:  Heart sounds are regular.  I do not hear murmur or gallop.  There is  no peripheral edema or cyanosis.   IMPRESSION:  Chronic asthmatic bronchitis with some exacerbation this  Summer.  We remain observant for possible reflux.   PLAN:  1.  Air quality precautions.  2.  Try Z-pack.  3.  Try increasing her current Pulmicort back to regular use at 2 puffs      b.i.d. for at least a month so that we can make assessment whether that      is useful or not.  4.  Try adding Foradil sample one b.i.d. to  see if we can extend her wheeze-      free sleep interval with discussion of side-effects and interaction.  5.  Schedule return in 6 months, earlier p.r.n.                                   Clinton D. Maple Hudson, MD, [facility], FACP   CDY/MedQ  DD:  12/01/2005  DT:  12/01/2005  Job #:  [id number]   cc:   [name], MD  Llana Aliment. Malon Kindle., MD

## 2010-09-12 NOTE — Consult Note (Signed)
NAME:  [name], [name]                           ACCOUNT NO.:  [id number]   MEDICAL RECORD NO.:  [id number]                   PATIENT TYPE:  INP   LOCATION:  0356                                 FACILITY:  [facility]   PHYSICIAN:  Gustavus Messing. Orlin Hilding, M.D.          DATE OF BIRTH:  1924/05/16   DATE OF CONSULTATION:  11/14/2003  DATE OF DISCHARGE:                                   CONSULTATION   REASON FOR CONSULTATION:  Syncope.   HISTORY OF PRESENT ILLNESS:  [name] is a 77 year old right-handed white  woman with a history of diabetes, hypertension, coronary artery disease,  obesity, hyperlipidemia, hypothyroidism, etc., history of pernicious anemia,  who was admitted with a weeklong history of repeated episodes of presyncope  upon standing only. She says she would be standing up, she would get  palpitations, sensation of graying of vision that will then go to total loss  of vision, and then she feels like she is going to pass out if she does not  lie down. This resolves in a few seconds if she will lie down, which she has  done on every occasion. She has never actually loss consciousness. She has  not had other cranial nerves symptoms such as numbness of her limbs or  weakness of her limbs or face, bladder or bowel control, complete loss of  consciousness, double vision, slurred speech, or trouble swallowing.   REVIEW OF SYSTEMS:  Positive for fatigue, exercise intolerance, and  occasional palpitations.   PAST MEDICAL HISTORY:  Significant for coronary artery disease, status post  coronary artery bypass graft in 2000, hypertension, type 2 diabetes with  diabetic neuropathy, obesity, hyperlipidemia, hypothyroidism, COPD, asthma,  GERD, pernicious anemia. She has had jaw reconstruction, herniorrhaphy, and  CABG.   MEDICATIONS:  Glipizide, Lantus, aspirin, Cardura, Micardis, DynaCirc,  Humalog, Synthroid, albuterol, multivitamin, B12 injections, Ambien, Nexium,  and Neurontin.   ALLERGIES:  NIACIN, NORMODYNE, BEXTRA, LIPITOR, QUINAMM, and TRILEPTAL.   SOCIAL HISTORY:  She lives at home with her husband who is usually  physically active until this started a week ago. She stopped smoking in  1986.   FAMILY HISTORY:  Positive for coronary artery disease, diabetes, and  hypertension.   PHYSICAL EXAMINATION:  VITAL SIGNS: Temperature 97.4. Orthostats which were  obtained on the 18th showed that her blood pressure went from 117/64 with  pulse of 69 lying, up to 124/69 with a pulse of 71 seated, and then dropped  to considerably to 95/57 with an increase of her pulse to 82 while standing.  The seated to standing drop is almost 30 points in the systolic and 15  points in the diastolic.  HEENT: Head is normocephalic and atraumatic.  NECK: Supple without bruits.  HEART: Regular rate and rhythm.  EXTREMITIES: Without edema.  NEUROLOGIC: She is awake, alert, and appropriate with normal language.  Cranial nerves reveal pupils are equal and reactive. Visual fields are  full  to confrontation. Extraocular movements are intact without nystagmus. Facial  sensation is normal. Facial motor activity is normal. Hearing is intact.  Palate is symmetrical. On motor exam, normal station and gait.  She does not  feel well standing; however, she is somewhat tentative. No drift, no  satelliting. Normal bulk, tone, and strength throughout.  Reflexes are 2+  and symmetric with the exception of the ankle jerks that are absent. She has  downgoing toes. Coordination, finger-to-nose, rapid alternating movements,  and heel-to-shin are normal. Sensory exam is intact, except for some distal  sensory loss.   MRI scan of the brain is negative for anything acute.  No lesions seen in  the posterior fossa. She has some chronic appearing small vessel ischemia in  the cerebral hemispheres bilaterally. Vertebral arteries are patent with a  dominant left and the basilar is patent. On carotid Doppler she  has no  significant ICA stenosis and has antegrade flow bilaterally.   IMPRESSION:  I respectively disagree with Dr. Marvel Plan interpretation. I  do not think this is of neurologic origin. I do not think this is  vertebrobasilar insufficiency or seizure. It appears most consistent with  orthostatic hypotension to me.   RECOMMENDATIONS:  None new. I would agree with holding blood pressure  medications and possibly using Florinef or ProAmatine if needed. No further  neurologic workup is needed.                                               Catherine A. Orlin Hilding, M.D.    CAW/MEDQ  D:  11/14/2003  T:  11/14/2003  Job:  [id number]

## 2010-09-12 NOTE — Op Note (Signed)
NAME:  [name], [name]                           ACCOUNT NO.:  [id number]   MEDICAL RECORD NO.:  [id number]                   PATIENT TYPE:  AMB   LOCATION:  ENDO                                 FACILITY:  [facility]   PHYSICIAN:  James L. Malon Kindle., M.D.          DATE OF BIRTH:  1924/05/06   DATE OF PROCEDURE:  09/20/2003  DATE OF DISCHARGE:                                 OPERATIVE REPORT   PROCEDURE:  Colonoscopy.   MEDICATIONS GIVEN:  DDAVP 25 mcg IV, Fentanyl 125 mcg, Versed 12.5 mg IV,  for both procedures.   INSTRUMENT USED:  Olympus pediatric colonoscope.   INDICATIONS FOR PROCEDURE:  Previous history of adenomatous polyps.  This is  done as a follow up colonoscopy.   DESCRIPTION OF PROCEDURE:  The procedure was explained and patient consent  obtained.  With the patient in the left lateral decubitus position, the  Olympus scope was inserted and advanced.  The prep was excellent.  Using a  slight amount of abdominal pressure, we were easily able to reach the cecum.  The ileocecal valve and appendiceal orifice was seen.  The scope was  withdrawn.  The cecum, ascending colon, transverse colon, descending and  sigmoid colon were seen well.  There were no polyps seen in the descending  or sigmoid colon.  Diverticulosis in the sigmoid.  The rectum was seen well  and was free of polyps.  The scope was withdrawn.  The patient tolerated the  procedure well.  There were no immediate problems.   ASSESSMENT:  1. History of colon polyps with normal colonoscopy at this time, V12.72.  2. Diverticulosis, 62.10.   PLAN:  Will check stools yearly for blood and recommend repeating  colonoscopy in five years.                                               James L. Malon Kindle., M.D.    [name]  D:  09/20/2003  T:  09/20/2003  Job:  [id number]   cc:   [name], M.D.  [address]  [place]  [place] [address]  Fax: [phone]   Pershing Cox, M.D.  [address]  [place]  [place] [address]  Fax: [phone]   Valentino Hue. Magrinat, M.D.  [address] [facility]  [place]  [place] [address]  Fax: [phone]

## 2010-09-12 NOTE — Procedures (Signed)
[facility]  Patient:    [name], [name]                        MRN: [id number] Proc. Date: 10/07/99 Adm. Date:  [id number] Disc. Date: [id number] Attending:  Orland Mustard CC:         Pershing Cox, M.D.             [name], M.D.             Valentino Hue. Magrinat, M.D.                           Procedure Report  PROCEDURE:  Colonoscopy.  ENDOSCOPIST:  Llana Aliment. Edwards, M.D.  MEDICATIONS:  Fentanyl 75 mcg and Versed 7 mg IV.  INDICATION:  History of constipation and bleeding from her anal area.  History of previous gastric polyps.  SCOPE:  Olympus pediatric video colonoscope.  DESCRIPTION OF PROCEDURE:  Procedure had been explained to patient and consent obtained.  Due to [facility] macroglobulinemia, the patient requested we not consider any polypectomy and this has been the plan in the past.  She has needed to come in for IV medications prior to any type of polypectomy.  With the patient in the left lateral decubitus position, digital exam was performed.  She had anal stenosis but the digital index finger was readily passed.  We advanced to the cecum.  At the tip of the cecum was what appeared to be a 3- to 4-mm sessile polyp that was quite red.  I did not go ahead and remove it at this time.  There were no other gross lesions.  The patient had a somewhat dirty cecum, so it was possible something small could have been missed.  The scope was withdrawn.  No other polyps were found throughout the entire colon.  No significant diverticular disease.  She had marked anal stenosis but no obvious hemorrhoids, rectal polyps or other sources of bleeding.  She tolerated the procedure well.  ASSESSMENT: 1. Cecal polyp, 3 to 4 mm, sessile, not removed due to her Waldenstroms    macroglobulinemia. 2. Anal stenosis.  PLAN: 1. We will see her in the office in two to three months.  Will continue on    Miralax. 2. Will recommend repeat colonoscopy  in six months. DD:  10/07/99 TD:  10/10/99 Job: [id number]

## 2010-09-12 NOTE — Assessment & Plan Note (Signed)
[facility]                             PULMONARY OFFICE NOTE   NAME:[name], [name]                        MRN:          [id number]  DATE:04/30/2006                            DOB:          01-Aug-1924    CARDIOLOGIST:  Dr. Simona Huh.   PROBLEM:  1. Asthmatic bronchitis/ chronic obstructive pulmonary disease.  2. Musculoskeletal chest wall pain.  3. Coronary disease/bypass/congestive failure.  4. Diabetes.  5. Hypothyroid.  6. Gallbladder disease.  7. Von Willebrand disease, preventative Coumadin use for paroxysmal      atrial fibrillation.  8. Pulmonary hypertension.  9. Pernicious anemia.   HISTORY:  Persistent cough produces little white plugs. This has not  changed in months. She now has a cold which she is getting over on her  own. There has been no purulent change or blood. Easy exertional dyspnea  with routine activities walking around in her home from 1 room to the  next. She thinks her breathing has gotten gradually worse over the past  4 to 5 years. Foradil over dried her. She failed the Spiriva. Now out of  albuterol which works adequately except that it makes her shaky. Her  last pulmonary function test in August 2005 had shown obstructive change  most significantly in the small airways with moderate obstructive  disease, normal lung volumes and significant response to bronchodilator.   MEDICATIONS:  1. Micardis 80 mg.  2. Lantus.  3. Vitamin B12 twice a month.  4. Doxazosin 4 mg.  5. Synthroid 0.2.  6. Ambien 10 mg.  7. Albuterol inhaler.  8. Pulmicort 2 puffs b.i.d.   OBJECTIVE:  Weight 212 pounds, blood pressure 106/56, pulse regular 100,  room air saturation 95%.  Breath sounds are diminished. There is very slight cough but no rales,  wheeze or rhonchi.  No peripheral edema.  No neck vein distension and no stridor.  HEART SOUNDS: Regular, I do not hear a murmur.   IMPRESSION:  This is mostly chronic obstructive  pulmonary disease  recently with more of a bronchitic component.   PLAN:  1. We discussed and offered pulmonary rehabilitation evaluation.  2. Try changing her albuterol to Xopenex HFA metered inhaler because      of tremor, 2 puffs up to q.i.d. p.r.n.  3. Schedule pulmonary function test.  4. Schedule return 6 months, earlier p.r.n.     Clinton D. Maple Hudson, MD, [facility], FACP  Electronically Signed    CDY/MedQ  DD: 04/30/2006  DT: 04/30/2006  Job #: [id number]   cc:   [name], M.D.  James L. Malon Kindle., M.D.

## 2010-09-13 LAB — BASIC METABOLIC PANEL
BUN: 18 mg/dL (ref 6–23)
Calcium: 8.9 mg/dL (ref 8.4–10.5)
GFR calc non Af Amer: >60 mL/min (ref >60)
Glucose, Bld: 251 mg/dL — ABNORMAL HIGH (ref 70–99)
Sodium: 138 mEq/L (ref 135–145)

## 2010-09-13 LAB — CBC
HCT: 31.4 % — ABNORMAL LOW (ref 36.0–46.0)
MCHC: 31.5 g/dL (ref 30.0–36.0)
RDW: 16.7 % — ABNORMAL HIGH (ref 11.5–15.5)

## 2010-09-13 LAB — GLUCOSE, CAPILLARY
Glucose-Capillary: 218 mg/dL — ABNORMAL HIGH (ref 70–99)
Glucose-Capillary: 92 mg/dL (ref 70–99)

## 2010-09-14 DIAGNOSIS — I5043 Acute on chronic combined systolic (congestive) and diastolic (congestive) heart failure: Secondary | ICD-10-CM

## 2010-09-14 LAB — PROTIME-INR
INR: 2.53 — ABNORMAL HIGH (ref 0.00–1.49)
Prothrombin Time: 27.4 seconds — ABNORMAL HIGH (ref 11.6–15.2)

## 2010-09-14 LAB — BASIC METABOLIC PANEL
Calcium: 9.3 mg/dL (ref 8.4–10.5)
GFR calc Af Amer: >60 mL/min (ref >60)
GFR calc non Af Amer: 58 mL/min — ABNORMAL LOW (ref >60)
Sodium: 137 mEq/L (ref 135–145)

## 2010-09-14 LAB — GLUCOSE, CAPILLARY: Glucose-Capillary: 312 mg/dL — ABNORMAL HIGH (ref 70–99)

## 2010-09-14 LAB — PRO B NATRIURETIC PEPTIDE: Pro B Natriuretic peptide (BNP): 1190 pg/mL — ABNORMAL HIGH (ref 0–450)

## 2010-09-14 NOTE — H&P (Addendum)
[name], [name]                 ACCOUNT NO.:  [id number]  MEDICAL RECORD NO.:  [id number]           PATIENT TYPE:  E  LOCATION:  [facility]                         FACILITY:  [facility]  PHYSICIAN:  [name]. Jens Som, MD, FACCDATE OF BIRTH:  1924-07-26  DATE OF ADMISSION:  09/11/2010 DATE OF DISCHARGE:                             HISTORY & PHYSICAL   CHIEF COMPLAINT:  Shortness of breath.  HISTORY OF PRESENT ILLNESS:  An 77 year old white female with past medical history significant for coronary artery disease status post CABG, mild systolic dysfunction with an ejection fraction of 45% to 50%, atrial fibrillation status post AV node ablation and pacemaker currently on Coumadin, history of left atrial appendage, COPD, diabetes, hypertension, hyperlipidemia, von Willebrand factor, hypothyroidism who is presenting with a 2-day history of increased dyspnea.  The patient states that she has had increased dyspnea especially at night.  She endorses increased coughing.  She denies any chest discomfort, syncope, or dizziness.  She also denies any issues with bleeding while on Coumadin.  She uses Xopenex inhaler as needed, which is usually once a day.  PAST MEDICAL HISTORY:  As above in HPI.  SOCIAL HISTORY:  No tobacco, no alcohol.  She lives at home.  FAMILY HISTORY:  Noncontributory.  ALLERGIES:  ATORVASTATIN, LABETALOL, DICYCLOMINE, CARBAMAZEPINE, SULFA, TRILEPTAL, AUGMENTIN, CEFACLOR, ASPIRIN, causes GI upset, and CLARITHROMYCIN.  CURRENT MEDICATIONS: 1. Zoloft 100 mg daily. 2. Lasix 40 mg daily. 3. Humalog sliding scale. 4. Lantus 44 mg daily. 5. Metoprolol tartrate 25 mg daily. 6. Potassium chloride 10 mEq daily. 7. Synthroid 0.2 mg daily. 8. Xopenex 2 puffs as needed. 9. Lovaza and Lyrica were recently started at unknown doses.  REVIEW OF SYSTEMS:  As in HPI.  The patient also endorses using oxygen at home frequently.  Other systems as in HPI, otherwise negative.  PHYSICAL  EXAMINATION:  VITAL SIGNS:  Temperature 98.1, blood pressure 153/62, heart rate 72, respiratory rate 20, saturating 99% on 2 L. GENERAL:  No acute distress. HEENT:  Normocephalic, atraumatic. NECK:  Positive for JVD. HEART:  Regular rate and rhythm without murmur. LUNGS:  Bilateral mild wheezing. ABDOMEN:  Soft, nontender. EXTREMITIES:  Trace bilateral lower extremity edema. NEURO:  Grossly nonfocal. PSYCHIATRIC:  The patient is appropriate.  LABORATORY DATA:  BMP is unremarkable other than potassium of 3.7.  CBC is unremarkable other than a hemoglobin of 10, which is down from 11.8 earlier on this month.  She is heme negative.  Her BNP is 2571.  Her chest x-ray shows no acute process except for some mild vascular congestion and her echo ejection fraction 45% to 50% and that is from February 2012.  EKG independently interpreted by myself demonstrates a ventricular lead paced rhythm at 70 beats per minute with underlying atrial fibrillation.  ASSESSMENT: 1. Mildly decompensated diastolic heart failure with mild volume     overload. 2. Chronic obstructive pulmonary disease. 3. Atrial fibrillation. 4. Hypothyroidism.  PLAN:  The patient does need some diuresis, she does have JVD on exam, although she is not grossly volume overloaded.  We will place her on Lasix 40 mg IV b.i.d.  She will likely only need 2-3 doses.  We will place her on albuterol/Atrovent scheduled nebulizers q.6 h.  If she does not respond to the steroids, antibiotics can also be considered.  I will check a TSH.  Her other home meds will be continued and she will be placed on sliding scale insulin.  Her last echocardiogram was in February of this year and now she has a significantly elevated BNP with signs of volume overload.  A repeat echo could be considered.  Lastly she has mild systolic dysfunction and is not on ACE inhibitor or ARB, this should also be considered.     Brayton El,  MD   ______________________________ [name]. Jens Som, MD, [facility]    SGA/MEDQ  D:  09/12/2010  T:  09/12/2010  Job:  [id number]  Electronically Signed by Olga Millers MD [facility] on 09/14/2010 04:54:09 AM Electronically Signed by Raynelle Bring MD on 09/25/2010 11:47:06 AM

## 2010-09-15 LAB — BASIC METABOLIC PANEL
CO2: 33 mEq/L — ABNORMAL HIGH (ref 19–32)
Calcium: 9.6 mg/dL (ref 8.4–10.5)
Creatinine, Ser: 0.81 mg/dL (ref 0.4–1.2)
GFR calc Af Amer: >60 mL/min (ref >60)
GFR calc non Af Amer: >60 mL/min (ref >60)
Sodium: 137 mEq/L (ref 135–145)

## 2010-09-15 LAB — CBC
Hemoglobin: 10.5 g/dL — ABNORMAL LOW (ref 12.0–15.0)
MCH: 24.7 pg — ABNORMAL LOW (ref 26.0–34.0)
MCV: 77.9 fL — ABNORMAL LOW (ref 78.0–100.0)
RBC: 4.25 MIL/uL (ref 3.87–5.11)

## 2010-09-15 LAB — GLUCOSE, CAPILLARY
Glucose-Capillary: 170 mg/dL — ABNORMAL HIGH (ref 70–99)
Glucose-Capillary: 188 mg/dL — ABNORMAL HIGH (ref 70–99)

## 2010-09-17 ENCOUNTER — Inpatient Hospital Stay (HOSPITAL_COMMUNITY)
Admit: 2010-09-17 | Discharge: 2010-09-21 | DRG: 191 | Disposition: A | Discharge status: Home Health | Payer: Medicare Other | Source: Other Acute Inpatient Hospital | Attending: Cardiology | Admitting: Cardiology

## 2010-09-17 ENCOUNTER — Inpatient Hospital Stay (HOSPITAL_COMMUNITY): Payer: Medicare Other

## 2010-09-17 DIAGNOSIS — I2510 Atherosclerotic heart disease of native coronary artery without angina pectoris: Secondary | ICD-10-CM | POA: Diagnosis present

## 2010-09-17 DIAGNOSIS — E7800 Pure hypercholesterolemia, unspecified: Secondary | ICD-10-CM | POA: Diagnosis present

## 2010-09-17 DIAGNOSIS — J4489 Other specified chronic obstructive pulmonary disease: Principal | ICD-10-CM | POA: Diagnosis present

## 2010-09-17 DIAGNOSIS — Z882 Allergy status to sulfonamides status: Secondary | ICD-10-CM

## 2010-09-17 DIAGNOSIS — I509 Heart failure, unspecified: Secondary | ICD-10-CM | POA: Diagnosis present

## 2010-09-17 DIAGNOSIS — Z794 Long term (current) use of insulin: Secondary | ICD-10-CM

## 2010-09-17 DIAGNOSIS — D6800 Von Willebrand disease, unspecified: Secondary | ICD-10-CM | POA: Diagnosis present

## 2010-09-17 DIAGNOSIS — R0602 Shortness of breath: Secondary | ICD-10-CM

## 2010-09-17 DIAGNOSIS — Z950 Presence of cardiac pacemaker: Secondary | ICD-10-CM

## 2010-09-17 DIAGNOSIS — E119 Type 2 diabetes mellitus without complications: Secondary | ICD-10-CM | POA: Diagnosis present

## 2010-09-17 DIAGNOSIS — J449 Chronic obstructive pulmonary disease, unspecified: Principal | ICD-10-CM | POA: Diagnosis present

## 2010-09-17 DIAGNOSIS — E039 Hypothyroidism, unspecified: Secondary | ICD-10-CM | POA: Diagnosis present

## 2010-09-17 DIAGNOSIS — I4891 Unspecified atrial fibrillation: Secondary | ICD-10-CM | POA: Diagnosis present

## 2010-09-17 DIAGNOSIS — F0390 Unspecified dementia without behavioral disturbance: Secondary | ICD-10-CM | POA: Diagnosis present

## 2010-09-17 DIAGNOSIS — Z7901 Long term (current) use of anticoagulants: Secondary | ICD-10-CM

## 2010-09-17 DIAGNOSIS — I5030 Unspecified diastolic (congestive) heart failure: Secondary | ICD-10-CM | POA: Diagnosis present

## 2010-09-17 DIAGNOSIS — I10 Essential (primary) hypertension: Secondary | ICD-10-CM | POA: Diagnosis present

## 2010-09-17 LAB — BASIC METABOLIC PANEL
CO2: 26 mEq/L (ref 19–32)
Calcium: 9.2 mg/dL (ref 8.4–10.5)
Chloride: 94 mEq/L — ABNORMAL LOW (ref 96–112)
GFR calc Af Amer: >60 mL/min (ref >60)
GFR calc Af Amer: >60 mL/min (ref >60)
GFR calc non Af Amer: >60 mL/min (ref >60)
Potassium: 4.3 mEq/L (ref 3.5–5.1)
Potassium: 4.7 mEq/L (ref 3.5–5.1)
Sodium: 132 mEq/L — ABNORMAL LOW (ref 135–145)
Sodium: 131 mEq/L — ABNORMAL LOW (ref 135–145)

## 2010-09-17 LAB — GLUCOSE, CAPILLARY
Glucose-Capillary: 368 mg/dL — ABNORMAL HIGH (ref 70–99)
Glucose-Capillary: 141 mg/dL — ABNORMAL HIGH (ref 70–99)
Glucose-Capillary: 113 mg/dL — ABNORMAL HIGH (ref 70–99)

## 2010-09-17 LAB — CBC
HCT: 32.8 % — ABNORMAL LOW (ref 36.0–46.0)
Hemoglobin: 10.2 g/dL — ABNORMAL LOW (ref 12.0–15.0)
MCHC: 31.1 g/dL (ref 30.0–36.0)
MCV: 78.7 fL (ref 78.0–100.0)
RDW: 16.8 % — ABNORMAL HIGH (ref 11.5–15.5)
WBC: 4.7 10*3/uL (ref 4.0–10.5)

## 2010-09-17 LAB — CARDIAC PANEL(CRET KIN+CKTOT+MB+TROPI)
CK, MB: 3.1 ng/mL (ref 0.3–4.0)
Relative Index: INVALID (ref 0.0–2.5)
Total CK: 77 U/L (ref 7–177)

## 2010-09-17 LAB — D-DIMER, QUANTITATIVE: D-Dimer, Quant: 0.35 ug/mL-FEU (ref 0.00–0.48)

## 2010-09-17 NOTE — H&P (Signed)
[name], [name] NO.:  [id number]  MEDICAL RECORD NO.:  [id number]           PATIENT TYPE:  I  LOCATION:  2034                         FACILITY:  [facility]  PHYSICIAN:  Zacarias Pontes, MD       DATE OF BIRTH:  1924-08-20  DATE OF ADMISSION:  09/17/2010 DATE OF DISCHARGE:                             HISTORY & PHYSICAL   PRIMARY CARDIOLOGIST:  [name]. Jens Som, MD, [facility].  PRIMARY CARE DOCTOR:  Dr. Wyline Mood.  CHIEF COMPLAINT:  Shortness of breath.  HISTORY OF PRESENT ILLNESS:  Ms. [name] is an 77 year old woman with an extensive cardiopulmonary past medical history including coronary artery disease status post coronary artery bypass grafting, atrial fibrillation status post AV node ablation, on chronic anticoagulation, COPD, hypertension, and diabetes mellitus, who presented earlier this evening to [facility] with shortness of breath.  She was discharged from [facility] on May 21.  Since getting home less than 48 hours ago, she reports feeling "no better or no worse" than when she was discharged. The patient's daughter reportedly called EMS because did not like how her mother was breathing.  The patient denies chest pain or any other acute complaints at this time, aside from her breathing being "a little off" chronically.  The patient is unable to recount medications that she takes.  At [facility], she did receive IV steroids prior to her transfer here for further evaluation and management.  PAST MEDICAL HISTORY: 1. Coronary artery disease status post coronary artery bypass grafting     in the past. 2. Atrial fibrillation status post AV node ablation, on systemic     anticoagulation chronically. 3. COPD, which has been deemed moderate in severity. 4. Diabetes mellitus. 5. Diabetic neuropathy. 6. Hypertension. 7. Hyperlipidemia. 8. Hypothyroidism. 9. Mild dementia. 10.GERD. 11.Von Willebrand disease.  PAST SOCIAL HISTORY:  The patient  reportedly lives at home with her daughter and is not a smoker or user of alcohol.  FAMILY HISTORY:  Noncontributory.  REVIEW OF SYSTEMS:  As per HPI, otherwise is comprehensively negative.  ALLERGIES:  ATORVASTATIN, LABETALOL, SULFA, CLARITHROMYCIN, ASPIRIN.  MEDICATIONS: 1. Lasix 40 mg in the morning, 20 mg in the evening. 2. Potassium chloride 20 mEq p.o. b.i.d. 3. Lantus 44 units at bedtime. 4. Humalog sliding scale. 5. Ipratropium bromide 18 mcg inhaled once daily. 6. Protonix 40 mg p.o. b.i.d. 7. Symbicort 1 puff b.i.d. 8. Toprol-XL 25 mg daily. 9. Synthroid 200 mcg daily. 10.Coumadin 7.5 mg alternating with 5 mg daily. 11.Xopenex inhalers as needed. 12.Zoloft 100 mg p.o. daily.  PHYSICAL EXAM:  VITAL SIGNS:  A comprehensive cardiovascular exam was performed.  The patient's blood pressure is 164/80 with heart rate in the 80s, respiratory rate of 16, satting 98% on 2 liters nasal cannula. GENERAL:  She is in no acute distress and not using any accessory muscles to breathe. HEENT:  Notable for neck that is supple with no masses orlymphadenopathy.  JVP was not appreciably elevated. CARDIAC:  Notable for a normal S1-S2 with no appreciated murmurs or rubs. LUNGS:  Notable for no evident crackles, but with scattered diffuse expiratory wheezes.  ABDOMEN:  Obese, but soft, nontender, nondistended, with no abdominal bruits. EXTREMITIES:  She has no lower extremity edema with a right lower extremity which is slightly cooler than her left.  She reports this is a chronic issue. NEUROLOGIC:  She is alert and oriented to person, place, and time.  LABORATORY EVALUATION:  Her white count is 5, her platelets 275,000, her hematocrit is 30.  Sodium 134, potassium 4.5, chloride 98, bicarb 27, BUN 19, creatinine 0.8 with a glucose of 379.  Troponin is less than 0.01.  Her INR is 1.8.  Her electrocardiogram is paced with an underlying rhythm of atrial fibrillation, and heart rate in the  80s.  An echocardiogram in February 2012 shows an EF of 45%-50% with severe hypokinesis of her mid to distal anterior septum.  A nuclear stress test in November 2011 shows an inferior infarct and mild peri-infarct ischemia in this area with an EF activated in the 47% range.  IMPRESSION:  This is an 77 year old woman with multiple chronic medical conditions including dyspnea from chronic obstructive pulmonary disease, who represents with likely acute-on-chronic chronic obstructive pulmonary disease with no evidence of volume overload or ongoing acute coronary syndrome.  PLAN:  From a pulmonary standpoint, we will treat her aggressively with nebs and inhalers.  She did receive a single dose of steroids at the outside hospital prompting her glucose to then rise above 500.  For now, given that she looks reasonably stable from a clinical stable, we will avoid using more steroids unless her acuity of dyspnea becomes more clinically pronounced.  I am hopeful and confident that she will stabilize from a pulmonary standpoint on proper administration of her comprehensive home COPD regimen.  From a cardiovascular standpoint, she exhibits no signs of volume overload, and thus we will continue her home dosing of p.o. Lasix and beta-blockade along with her potassium repletion regimen.  From the standpoint of her atrial fibrillation, we will continue her anticoagulation in the form of Coumadin dosed in alternating fashion between 5 and 7.5 mg daily.  She is on Toprol for rate control and we will continue this at her home dose.  It will be helpful to connect with family so that her care and eventual transition to home can be optimally coordinated.          ______________________________ Zacarias Pontes, MD     DM/MEDQ  D:  09/17/2010  T:  09/17/2010  Job:  [id number]  Electronically Signed by Zacarias Pontes MD on 09/17/2010 02:25:16 PM

## 2010-09-18 ENCOUNTER — Inpatient Hospital Stay (HOSPITAL_COMMUNITY): Payer: Medicare Other

## 2010-09-18 DIAGNOSIS — I25810 Atherosclerosis of coronary artery bypass graft(s) without angina pectoris: Secondary | ICD-10-CM

## 2010-09-18 DIAGNOSIS — I4891 Unspecified atrial fibrillation: Secondary | ICD-10-CM

## 2010-09-18 DIAGNOSIS — R0602 Shortness of breath: Secondary | ICD-10-CM

## 2010-09-18 DIAGNOSIS — I509 Heart failure, unspecified: Secondary | ICD-10-CM

## 2010-09-18 DIAGNOSIS — J441 Chronic obstructive pulmonary disease with (acute) exacerbation: Secondary | ICD-10-CM

## 2010-09-18 LAB — PROTIME-INR: Prothrombin Time: 20.5 seconds — ABNORMAL HIGH (ref 11.6–15.2)

## 2010-09-18 LAB — CBC
Hemoglobin: 10.5 g/dL — ABNORMAL LOW (ref 12.0–15.0)
MCH: 24.9 pg — ABNORMAL LOW (ref 26.0–34.0)
MCHC: 32.2 g/dL (ref 30.0–36.0)
Platelets: 263 10*3/uL (ref 150–400)
RDW: 16.8 % — ABNORMAL HIGH (ref 11.5–15.5)

## 2010-09-18 LAB — GLUCOSE, CAPILLARY: Glucose-Capillary: 152 mg/dL — ABNORMAL HIGH (ref 70–99)

## 2010-09-18 LAB — BASIC METABOLIC PANEL
Chloride: 93 mEq/L — ABNORMAL LOW (ref 96–112)
GFR calc Af Amer: 59 mL/min — ABNORMAL LOW (ref >60)
Potassium: 3.9 mEq/L (ref 3.5–5.1)

## 2010-09-19 DIAGNOSIS — I5033 Acute on chronic diastolic (congestive) heart failure: Secondary | ICD-10-CM

## 2010-09-19 LAB — GLUCOSE, CAPILLARY
Glucose-Capillary: 296 mg/dL — ABNORMAL HIGH (ref 70–99)
Glucose-Capillary: 408 mg/dL — ABNORMAL HIGH (ref 70–99)
Glucose-Capillary: 164 mg/dL — ABNORMAL HIGH (ref 70–99)

## 2010-09-19 LAB — BASIC METABOLIC PANEL
CO2: 31 mEq/L (ref 19–32)
Calcium: 9.1 mg/dL (ref 8.4–10.5)
Chloride: 100 mEq/L (ref 96–112)
GFR calc Af Amer: >60 mL/min (ref >60)
Sodium: 140 mEq/L (ref 135–145)

## 2010-09-19 LAB — GLUCOSE, RANDOM: Glucose, Bld: 418 mg/dL — ABNORMAL HIGH (ref 70–99)

## 2010-09-19 LAB — CBC
HCT: 32.6 % — ABNORMAL LOW (ref 36.0–46.0)
Hemoglobin: 10.1 g/dL — ABNORMAL LOW (ref 12.0–15.0)
MCV: 77.6 fL — ABNORMAL LOW (ref 78.0–100.0)
RBC: 4.2 MIL/uL (ref 3.87–5.11)
WBC: 5.1 10*3/uL (ref 4.0–10.5)

## 2010-09-20 LAB — GLUCOSE, CAPILLARY
Glucose-Capillary: 304 mg/dL — ABNORMAL HIGH (ref 70–99)
Glucose-Capillary: 295 mg/dL — ABNORMAL HIGH (ref 70–99)

## 2010-09-20 LAB — PROTIME-INR: INR: 2.33 — ABNORMAL HIGH (ref 0.00–1.49)

## 2010-09-20 LAB — BASIC METABOLIC PANEL
BUN: 18 mg/dL (ref 6–23)
Chloride: 99 mEq/L (ref 96–112)
Potassium: 3.7 mEq/L (ref 3.5–5.1)

## 2010-09-21 LAB — BASIC METABOLIC PANEL
BUN: 20 mg/dL (ref 6–23)
CO2: 30 mEq/L (ref 19–32)
Calcium: 9.4 mg/dL (ref 8.4–10.5)
Creatinine, Ser: 1.09 mg/dL (ref 0.4–1.2)
GFR calc Af Amer: 58 mL/min — ABNORMAL LOW (ref >60)
Glucose, Bld: 215 mg/dL — ABNORMAL HIGH (ref 70–99)

## 2010-09-26 NOTE — Discharge Summary (Signed)
[name], [name]                 ACCOUNT NO.:  [id number]  MEDICAL RECORD NO.:  [id number]           PATIENT TYPE:  I  LOCATION:  2034                         FACILITY:  [facility]  PHYSICIAN:  [name]. Jens Som, MD, FACCDATE OF BIRTH:  07/31/1924  DATE OF ADMISSION:  09/17/2010 DATE OF DISCHARGE:                              DISCHARGE SUMMARY   PRIMARY CARDIOLOGIST:  [name]. Jens Som, MD, [facility]  PRIMARY PULMONOLOGIST:  Rennis Chris. Maple Hudson, MD, FCCP, FACP  PRIMARY CARE PHYSICIAN:  Junious Dresser, MD  CONSULTS DURING HOSPITALIZATION: 1. [place] D. Maple Hudson, MD 2. [facility].  PROCEDURES PERFORMED DURING HOSPITALIZATION:  None.  FINAL DISCHARGE DIAGNOSES: 1. Chronic obstructive pulmonary disease. 2. Diastolic congestive heart failure. 3. Atrial fibrillation. 4. Status post atrioventricular ablation with pacemaker. 5. Diabetes. 6. Hypertension. 7. Hypercholesterolemia. 8. Von Willebrand disease. 9. Mild dementia. 10.Hypothyroidism.  HOSPITAL COURSE:  This is an 77 year old female with extensive cardiopulmonary past as stated above who was discharged 48 hours prior to readmission feeling no better, no worse when she was discharged.  The patient's daughter called EMS, but did not know her because she did not like how her mother was breathing.  The patient denied any chest pain. She asides that from her breathing being a little more difficult, she was at her usual state of health.  The patient is a poor historian and was seen initially at [facility], where she received IV steroids for her breathing status and transferred for further evaluation and management.  This is a well-known patient of Dr. Jens Som and Dr. Jetty Duhamel.  The patient was admitted for further evaluation.  It was noted that the patient had a recent Myoview that showed inferior infarct with small peri-infarct ischemia with an EF of 45% which was done in November 2011. Her most  recent echo was in February 2012, with an EF of 45-50% with severe akinesis in the distal anterior septum.  The patient was actually reluctant to be readmitted.  However, her daughter insisted the patient was seen and examined by Dr. Maple Hudson for further evaluation from a pulmonary standpoint.  The patient was given cough suppressant and nebulizer steroids.  The patient did have some improvement in her symptoms.  The patient was followed by Dr. Jens Som throughout hospitalization with medication adjustments to include increased Lasix temporarily to 40 mg b.i.d. with potassium supplement and on day of discharge, this was decreased to 40 mg in a.m. and 20 mg in the p.m.  The patient states that her vision became very blurry on Sep 19, 2010, stating that she was unable to see clearly.  She has had issues with her vision on and off with known history of diabetes.  As a result of this, Dr. Jens Som consulted [facility] to rule out TIA. They did see the patient on Sep 20, 2010, and felt like this was morerelated to blood sugar control and is recommended for an outpatient Ophthalmology exam on discharge.  They offered no further testing or recommendations.  On day of discharge, the patient was seen and examined by Dr. Olga Millers and found to be  stable for discharge.  The patient's labs were all within normal limits.  The patient will be followed by home health nurses as an outpatient.  It was felt by Dr. Jens Som that she would probably be a better candidate for skilled nursing home facility for secondary to frequent admissions.  However, at this time, home health nurses are being instituted with further recommendations and need to proceed with skilled nursing home placement if condition does not improve or worsens.  The patient will follow up with Dr. Jens Som in 4-6 weeks.  She will follow up with Dr. Maple Hudson in approximately 2 weeks at outpatient Ophthalmology evaluation  for visual disturbances will be completed per family as they will make that appointment.  DISCHARGE LABORATORY DATA:  PT 25.1, INR 2.26.  Sodium 138, potassium 3.8, chloride 99, CO2 30, glucose 215, BUN 20, creatinine 1.09.  Pro BNP on Sep 20, 2010, 319.8, hemoglobin 10.1, hematocrit 32.6, white blood cells 5.1, platelets 284, D-dimer 0.35.  RADIOLOGY:  Chest x-ray dated Sep 18, 2010, revealing interval improvement in pulmonary vascular congestion pattern with residual small right pleural effusion.  No acute abnormality identified.  CT scan of the head revealing no acute intracranial abnormality or significant interval change, stable diffuse white matter disease.  This is nonspecific, but likely reflects the sequela of chronic microvascular ischemia dated Sep 17, 2010.  VITAL SIGNS ON DISCHARGE:  Blood pressure 121/64, pulse 74, respirations 18, temperature 97.8, O2 sats 90% on room air.  The patient's weight on discharge 94.2, admission weight 98.1.  DISCHARGE MEDICATIONS: 1. Acetaminophen 325 mg q.4 h. p.r.n. 2. Diltiazem 120 mg 1 tablet by mouth daily. 3. Furosemide 40 mg 1 tablet in a.m., one half tablet in the p.m. 4. Hydrocodone/APAP 5/325 one tablet by mouth q.6 h. p.r.n. 5. Nitroglycerin 0.4 mg p.r.n. chest pain. 6. Pantoprazole 40 mg by mouth b.i.d. 7. Potassium chloride 1 tablet by mouth b.i.d. 8. Guaifenesin XR 600 mg 1-2 tablets by mouth daily as needed. 9. Pravastatin 10 mg by mouth daily. 10.Humalog insulin 10-15 units subcutaneously t.i.d. 11.Lantus insulin 44 units subcutaneously q.a.m. 12.Symbicort 160/4.5 mcg 1 puff inhaled b.i.d. 13.Synthroid 200 mcg 1 tablet p.o. daily. 14.Toprol-XL 50 mg one half tablet by mouth daily. 15.Spiriva Aerolizer 18 mcg caps inhalation daily. 16.Coumadin per PT/INR usual dose 5 mg 1 tablet on Tuesdays and     Thursdays, Saturdays and Sundays, 1-1/2 tablets on Monday,     Wednesday and Friday. 17.Xopenex 45 mcg inhaler 2 puffs  inhaled q.4 h. p.r.n. 18.Zoloft 100 mg 1 tablet by mouth daily.  ALLERGIES:  ATORVASTATIN, LABETALOL, DICYCLOMINE, TEGRETOL, SULFA, AMOXICILLIN CLAVULANATE, CEFACLOR, ASPIRIN, CLARITHROMYCIN and LATEX.  FOLLOWUP PLANS AND APPOINTMENT: 1. The patient will be instituted with home health management.  Care     coordinator will arrange this prior to discharge.  They will see     her 3 times a week and also drop PT/INRs. 2. The patient will have a BMET drawn in 1 week as the patient is on     Lasix daily for renal function. 3. The patient will see Dr. Jetty Duhamel in 2 weeks.  The patient is     to call to make that appointment for continued pulmonary     management. 4. The patient will follow up with Dr. Olga Millers in 4-6 weeks.     Our office will call to make that appointment. 5. The patient's family will make a appointment with ophthalmologist     to evaluate vision changes  in the setting of diabetes. 6. The patient has been advised to bring all medications to followup     appointments. 7. The patient knows to call for any increase in symptoms.  Time spent     with the patient to include physician time 40 minutes.     Bettey Mare. Lyman Bishop, NP   ______________________________ [name]. Jens Som, MD, [facility]    KML/MEDQ  D:  09/21/2010  T:  09/21/2010  Job:  [id number]  cc:   Junious Dresser, MD  Electronically Signed by Joni Reining NP on 09/22/2010 09:04:01 PM Electronically Signed by Olga Millers MD [facility] on 09/26/2010 04:54:16 PM

## 2010-09-26 NOTE — Discharge Summary (Signed)
[name], [name]                 ACCOUNT NO.:  [id number]  MEDICAL RECORD NO.:  [id number]           PATIENT TYPE:  I  LOCATION:  3708                         FACILITY:  [facility]  PHYSICIAN:  [name]. Jens Som, MD, FACCDATE OF BIRTH:  [birth year]  DATE OF ADMISSION:  09/11/2010 DATE OF DISCHARGE:  09/15/2010                              DISCHARGE SUMMARY   PRIMARY. CARDIOLOGIST:  [name]. Jens Som, MD, [facility].  PRIMARY CARE PROVIDER:  Dr. Junious Dresser.  PULMONOLOGIST:  Rennis Chris. Maple Hudson, MD, FCCP, FACP.  DISCHARGE DIAGNOSIS:  Acute on chronic mixed systolic and diastolic congestive heart failure  SECONDARY DIAGNOSES: 1. Acute on chronic chronic obstructive pulmonary disease     exacerbation. 2. Coronary artery disease status post coronary artery bypass     grafting. 3. Chronic atrial fibrillation status post atrioventricular nodal     ablation and pacemaker placement. 4. Chronic Coumadin anticoagulation.5. Mild dementia. 6. Anemia. 7. Hypothyroidism. 8. Diabetes. 9. Obesity. 10.Hypertension. 11.Hyperlipidemia. 12.Remote tobacco abuse. 13.History of von Willebrand disease. 14.Diabetic neuropathy. 15.Gastroesophageal reflux disease.  ALLERGIES:  ATORVASTATIN, LABETALOL, DICYCLOMINE, CARBAMAZEPINE, SULFA, TRILEPTAL, AUGMENTIN, CEFACLOR, ASPIRIN, CLARITHROMYCIN.  PROCEDURES:  Pulmonary function testing performed on Sep 12, 2010:  FVC 1.61 (69%), FEV-1 of 1.05 (61%), FEV-1/FVC 66%, FEF 25-75, 0.50 (43%), DLCO UNC 12.44 (54%), DLCO COR 14.32 (62%).  Findings indicative of moderate obstructive airway disease with moderately severe restriction - parenchymal.  Moderate effusion defect.  HISTORY OF PRESENT ILLNESS:  An 77 year old female with the above problem list.  The patient was in her usual state of health until approximately 2 days prior to admission when she began to experience increasing dyspnea, especially at night with coughing.  She presented to the [facility] ED  on Sep 11, 2010, where exam revealed mild volume overload with an elevation in her pro-BNP to 2571.  Chest x-ray showed no acute process with mild vascular congestion.  The patient was admitted for IV diuresis.  The patient responded well to IV Lasix with reduction in her weight from 93.7 kg on admission to 89.7 kg today, at discharge.  It was felt that her dyspnea was likely multifactorial in the setting of prior tobacco abuse with history of asthma and COPD. Pulmonology was consulted and the patient was seen with recommendation for initiation of Spiriva therapy.  Pulmonary function testing was also undertaken with results as outlined above, in all suggestive of moderate obstructive airway disease.  Over the course of the past 4 days, the patient has had clinical improvement and is less dyspneic.  We plan to discharge her home today in good condition.  She will follow up with Dr. Maple Hudson within the next 2 weeks in the [facility] Pulmonology office and with Dr. Jens Som in 1 month.  DISCHARGE LABORATORY DATA:  Hemoglobin 10.5, hematocrit 33.1, WBC 6.2, platelets 247, INR 2.41.  Sodium 137, potassium 3.7, chloride 96, CO2 of 33, BUN 23, creatinine 0.81, glucose 263, calcium 9.6.  CK 62, MB 1.8, troponin I less than 0.30.  BNP 1190, down from 2571.  TSH 3.172.  Fecal occult blood was negative.  DISPOSITION:  The patient will be  discharged home today in good condition.  FOLLOWUP PLANS AND APPOINTMENTS:  The patient will follow up with her primary care provider, Dr. Junious Dresser in [place] for INR and basic metabolic panel on Sep 22, 2010, and a prescription has been provided.  She will follow up with Dr. Maple Hudson in approximately 2 weeks. Pulmonology office has been contacted and they will contact the patient. She has arrangements for followup with Dr. Jens Som on October 16, 2010, at 11:15 a.m.  DISCHARGE MEDICATIONS: 1. Lasix 20 mg 2 tablets in the morning, 1 tablet in the p.m. 2.  Ipratropium bromide 18 mcg 1 capsule inhaled daily. 3. Potassium chloride 20 mEq b.i.d. 4. Vitamin B12, 1000 mcg subcu twice monthly. 5. Guaifenesin XR 600 mg 1-2 tablets daily p.r.n. 6. Humalog 10-15 units sliding scale t.i.d. 7. Lantus 44 units daily. 8. Lovaza 1 g daily. 9. Protonix 40 mg b.i.d. 10.Symbicort 160/4.5 mcg 1 puff b.i.d. 11.Synthroid 200 mcg daily. 12.Toprol-XL 50 mg half a tablet daily. 13.Coumadin 5 mg one and a half tablets Monday, Wednesday, Friday; 1     tablet all other days. 14.Xopenex 45 mcg inhaler 2 puffs q.4 h. p.r.n. 15.Zoloft 100 mg daily.  OUTSTANDING LABS AND STUDIES:  Followup basic metabolic panel and INR next week.  DURATION OF DISCHARGE ENCOUNTER:  Forty five minutes including physician time.     [name], ANP   ______________________________ [name]. Jens Som, MD, [facility]    CB/MEDQ  D:  09/15/2010  T:  09/15/2010  Job:  [id number]  cc:   Dr. Junious Dresser  Electronically Signed by [name] ANP on 09/22/2010 07:46:05 PM Electronically Signed by Olga Millers MD [facility] on 09/26/2010 04:54:14 PM

## 2010-09-29 ENCOUNTER — Encounter: Payer: Self-pay | Admitting: Cardiology

## 2010-10-03 NOTE — Consult Note (Signed)
[name], [name] NO.:  [id number]  MEDICAL RECORD NO.:  [id number]  LOCATION:  2034                         FACILITY:  [facility]  PHYSICIAN:  Thana Farr, MD    DATE OF BIRTH:  [birth year]  DATE OF CONSULTATION:  09/20/2010 DATE OF DISCHARGE:                                CONSULTATION   CHIEF COMPLAINT:  Blurred vision, rule out TIA/CVA.  HISTORY OF PRESENT ILLNESS:  This is an 77 year old white female with multiple medical problems and possible remote TIA in the hospital for COPD exacerbation.  During this hospitalization, she complained of intermittent blurred vision.  She described the episodes being multiple times a day lasting 5-10 minutes each time and indicates that it seems like a "shade is being pulled on both sides, gradually dissipating."  At that time, she is unable to read up-close or distant, and her vision seems blurry.  There is no associated headache, diplopia, dysarthria, or weakness.  She said the symptoms began greater than a month ago and has no associated pain in the eye or light flashes.  Her last diabetic eye exam was greater than 1 year ago.  She does wear glasses at times to read.  PAST MEDICAL HISTORY: 1. CAD, status post CABG. 2. AFib, status post ablation, on chronic Coumadin anticoagulation. 3. COPD. 4. Hypertension. 5. Hyperlipidemia. 6. Diabetes mellitus, type 2 with an A1c of 9.5 and peripheral     neuropathy. 7. Hypothyroidism. 8. Mild dementia. 9. GERD. 10.Von Willebrand disease.  STROKE RISK FACTORS:  Obesity, coagulopathy, hyperlipidemia, diabetes, hypertension, AFib, and CAD.  MEDICATIONS IN THE HOSPITAL:  Lantus, NovoLog, diltiazem, Crestor, Xopenex, Symbicort, Spiriva, Coumadin, sertraline, levothyroxine, Lasix, and potassium.  ALLERGIES:  ATORVASTATIN, LABETALOL, SULFA, CLARITHROMYCIN, and ASPIRIN.  FAMILY HISTORY:  Noncontributory to this consult.  SOCIAL HISTORY:  She lives with her daughter and  does not smoke, drink alcohol, or use illicit drugs.  REVIEW OF SYSTEMS:  NEURO:  Intermittent blurred vision.  No double vision or headaches.  CARDIOVASCULAR:  No chest pain or palpitations. PULMONARY:  Short of breath with exertion especially.  GI:  No loss of bowel function.  Has GERD.  GU:  No loss of bladder function.  She has diabetes with peripheral neuropathy.  HEMATOLOGIC:  She has von Willebrand disease with no recent bleeding but easy bruising.  PHYSICAL EXAMINATION:  VITAL SIGNS:  Temperature is 97.3, blood pressure 140/71, heart rate is 74, respirations 18, SAO2 97% on room air, weight 94.8 kg. HEENT:  Normocephalic, atraumatic. LUNGS:  With decreased air movement, but no increased work of breathing. COR:  Regular rate and rhythm (paced on telemetry). NECK:  Supple without bruits. ABDOMEN:  Positive bowel sounds and obese. SKIN:  Multiple ecchymoses on upper and lower extremities and various stages of healing. NEURO:  The patient is alert and oriented x3 in no acute distress. Speech is appropriate and fluent.  Face is symmetric.  EOMI.  PERRLA with visual fields intact.  No nystagmus.  Conjugate gaze.  Tongue is midline.  Uvula elevates properly.  Strength is 5/5 in the upper and lower extremities bilaterally.  Tone is good.  RAM    I.  Facial and upper extremities sensation is intact.  Diminished lower extremity sensation in stocking-glove pattern just below the knees bilaterally. DTRs 2+ with downgoing plantars bilaterally.  F-to-N and H-to-S intact. NUTRITIONAL STATUS:  No dyspepsia symptoms a reported.  TEST RESULTS:  Sodium 141, potassium 3.7, BUN 18, creatinine 0.91, glucose 202.  WBC 5.1, hemoglobin 10.1, platelets 284.  INR 2.33.  D- dimer 0.35.  A1c 9.5.  Cholesterol 168, triglycerides 144, HDL 39, LDL 100.  CT without contrast, Sep 17, 2010, shows no acute bleed, infarct, or mass.  Diffuse white matter disease.  ASSESSMENT AND PLAN:  This is an 77 year old  obese white female with multiple risk factors for stroke as above.  She has complained of episodic blurred vision multiple times a day times for greater than 1 month. Neurologic exam is unremarkable.  Her symptoms are likely not of neurologic etiology as the episodes affect both eyes equally and are occurring on multiple occasions daily.  We are unable to do an MRI due to permanent transvenous pacemaker.  The patient is currently on anticoagulation with a therapeutic INR, statin therapy, and blood pressure medicines with adequate control.  No further neurologic workup is required.  We would recommend improved blood sugar control and outpatient retinal exam for followup of visual disturbance.  Thank you for allowing [place] to assist in the management of this patient. Please contact us if any further assistance is required.     Luan Moore, P.A.   ______________________________ Thana Farr, MD    TCJ/MEDQ  D:  09/20/2010  T:  09/21/2010  Job:  [id number]  cc:   Thana Farr, MD  Electronically Signed by Delice Bison JERNEJCIC P.A. on 10/02/2010 01:48:30 PM Electronically Signed by Thana Farr MD on 10/03/2010 12:22:45 PM

## 2010-10-08 ENCOUNTER — Emergency Department (HOSPITAL_COMMUNITY)
Admission: EM | Admit: 2010-10-08 | Discharge: 2010-10-08 | Disposition: A | Discharge status: Home / Self Care | Payer: Medicare Other | Attending: Emergency Medicine | Admitting: Emergency Medicine

## 2010-10-08 ENCOUNTER — Emergency Department (HOSPITAL_COMMUNITY): Payer: Medicare Other

## 2010-10-08 DIAGNOSIS — Z794 Long term (current) use of insulin: Secondary | ICD-10-CM | POA: Insufficient documentation

## 2010-10-08 DIAGNOSIS — I4891 Unspecified atrial fibrillation: Secondary | ICD-10-CM | POA: Insufficient documentation

## 2010-10-08 DIAGNOSIS — R0602 Shortness of breath: Secondary | ICD-10-CM | POA: Insufficient documentation

## 2010-10-08 DIAGNOSIS — E039 Hypothyroidism, unspecified: Secondary | ICD-10-CM | POA: Insufficient documentation

## 2010-10-08 DIAGNOSIS — J45909 Unspecified asthma, uncomplicated: Secondary | ICD-10-CM | POA: Insufficient documentation

## 2010-10-08 DIAGNOSIS — E119 Type 2 diabetes mellitus without complications: Secondary | ICD-10-CM | POA: Insufficient documentation

## 2010-10-08 DIAGNOSIS — Z7901 Long term (current) use of anticoagulants: Secondary | ICD-10-CM | POA: Insufficient documentation

## 2010-10-08 LAB — BASIC METABOLIC PANEL
BUN: 16 mg/dL (ref 6–23)
CO2: 29 mEq/L (ref 19–32)
Calcium: 9.3 mg/dL (ref 8.4–10.5)
Creatinine, Ser: 0.78 mg/dL (ref 0.4–1.2)
GFR calc non Af Amer: >60 mL/min (ref >60)
Glucose, Bld: 314 mg/dL — ABNORMAL HIGH (ref 70–99)

## 2010-10-08 LAB — GLUCOSE, CAPILLARY: Glucose-Capillary: 328 mg/dL — ABNORMAL HIGH (ref 70–99)

## 2010-10-08 LAB — CBC
Hemoglobin: 10.9 g/dL — ABNORMAL LOW (ref 12.0–15.0)
MCH: 23.9 pg — ABNORMAL LOW (ref 26.0–34.0)
MCHC: 30.6 g/dL (ref 30.0–36.0)
MCV: 77.9 fL — ABNORMAL LOW (ref 78.0–100.0)
RBC: 4.57 MIL/uL (ref 3.87–5.11)

## 2010-10-08 LAB — URINE MICROSCOPIC-ADD ON

## 2010-10-08 LAB — DIFFERENTIAL
Basophils Relative: 0 % (ref 0–1)
Lymphs Abs: 1.3 10*3/uL (ref 0.7–4.0)
Monocytes Absolute: 0.5 10*3/uL (ref 0.1–1.0)
Monocytes Relative: 6 % (ref 3–12)
Neutro Abs: 5.5 10*3/uL (ref 1.7–7.7)
Neutrophils Relative %: 74 % (ref 43–77)

## 2010-10-08 LAB — URINALYSIS, ROUTINE W REFLEX MICROSCOPIC
Bilirubin Urine: NEGATIVE
Ketones, ur: NEGATIVE mg/dL
Nitrite: NEGATIVE
Specific Gravity, Urine: 1.014 (ref 1.005–1.030)
Urobilinogen, UA: 0.2 mg/dL (ref 0.0–1.0)
pH: 6 (ref 5.0–8.0)

## 2010-10-10 LAB — URINE CULTURE

## 2010-10-16 ENCOUNTER — Encounter: Payer: Medicare Other | Admitting: Cardiology

## 2010-10-28 NOTE — Consult Note (Signed)
[name], [name]                 ACCOUNT NO.:  [id number]  MEDICAL RECORD NO.:  [id number]           PATIENT TYPE:  I  LOCATION:  2034                         FACILITY:  [facility]  PHYSICIAN:  Clinton D. Maple Hudson, MD, FCCP, FACPDATE OF BIRTH:  12/14/1926  DATE OF CONSULTATION:  09/18/2010 DATE OF DISCHARGE:                                CONSULTATION   REQUESTING PHYSICIAN:  [name]. Jens Som, MD, [facility]  PROBLEM FOR CONSULTATION:  This is a 77 year old former smoker with longstanding chronic lung and cardiac disease who was seen now for evaluation of persistent dyspnea and cough.  She has been discharged from [facility] on Sep 15, 2010, who was taken back to [facility] with shortness of breath and sent back to [facility] because family was concerned about the patient's breathing pattern.  The patient says that her breathing has not changed any since she was discharged.  I last saw her in the office on January 09, 2010, at which time she was noted to desaturate on room air to 86% walking across the room.  She was using oxygen continuously and still does at 2 L per minute.  She admits congested cough with little sputum going back at least through the winter and in fact this has been a pattern for her over several years. She recognizes that she has been on multiple medications and had multiple therapeutic efforts, but is unclear about what has been tried. Currently, she has a home oxygen concentrator and she has a home nebulizer machine.  Mostly, she is bothered by persistent congested cough with little sputum, easy dyspnea.  She does not notice bloody sputum, but otherwise color may very from clear to green.  She is not having chest pain or palpitation on any regular basis and feet are not swelling.  CURRENT MEDICATIONS:  For this hospital stay include; Lantus and NovoLog insulin with sliding scale, diltiazem, Crestor, Xopenex 0.63 mg by nebulizer, budesonide/formoterol (Symbicort)  160/4.5 twice daily, Spiriva, Coumadin, sertraline, levothyroxine, Coumadin, Lovenox.  REVIEW OF SYSTEMS:  History and physical were reviewed from admission H and P.  REVIEW OF SYSTEMS:  Primarily significant for a congested cough and dyspnea and for absence of chest pain, palpitation or ankle edema as noted.  She does not recognize that she is refluxing and denies nausea, vomiting, abdominal pain, bleeding, adenopathy, rash, fever or chills.  PAST MEDICAL HISTORY: 1. Coronary artery disease, status post CABG; atrial fibrillation,     status post ablation on chronic anticoagulation with electronic     pacemaker, COPD with formal PFT last done May 17, 2006, showing     an FEV-1 of 1.65 (92%) FEV-1/FVC ratio 0.60 with FEF 25-75% at 33%     of predicted with response to bronchodilator.  Total lung capacity     107%.  Diffusion capacity 68% of predicted interpreted as moderate     obstructive airways disease with mild response bronchodilator and     diffusion capacity mildly reduced. 2. Diabetes mellitus. 3. Diabetic neuropathy. 4. Hypertension. 5. Hyperlipidemia. 6. Hypothyroidism. 7. Mild dementia with significant small vessel ischemic disease on  Neuroradiology imaging. 8. GERD. 9. Von Willebrand disease.  SOCIAL HISTORY:  Former smoker, quit in 1987 after averaging one-half pack per day for 10 years.  She lives with husband and daughter.  FAMILY HISTORY:  Noncontributory at this age.  PAST SURGICAL HISTORY:  Coronary bypass graft, mandibular reconstruction with bone graft from ribs, appendectomy, hernia repair, pacemaker insertion.  MEDICATION ALLERGIES:  ATORVASTATIN, LABETALOL, SULFA, CLARITHROMYCIN and ASPIRIN.  OBJECTIVE:  VITAL SIGNS:  BP 122/71, heart rate 67 (paced), respirations 19, temperature 97.8 and oxygen saturation 95% on 2 L prongs. GENERAL:  She is quite alert and oriented and recognized me by name as I walked into the room, sitting at bedside  wearing supplemental oxygen. SKIN:  No evident rash, adenopathy:  None found at the neck, supraclavicular or axillary areas. NEUROLOGIC:  Oriented as noted, able to move all extremities.  Tongue protrudes midline.  Extraocular muscle was intact. HEENT:  Oral mucosa little dry.  Trachea midline.  No stridor, it maybe 1-cm neck vein distention sitting upright. CHEST:  Diffuse coarse breath sounds with wet congested sounding cough which is nonproductive.  Breath sounds are nonlateralizing.  There is no dullness.  No rub.  Work of breathing is not increased sitting quietly on oxygen. HEART:  Regular rhythm.  I do not hear a murmur. ABDOMEN:  Soft, nontender. EXTREMITIES:  There is no cyanosis, clubbing or edema.  LABORATORY DATA:  Hemoglobin 10.5, hematocrit 32.6, WBC 6800 and platelet count 263,000.  Sodium 131, potassium 4.7, chloride 94, CO2 26, BUN is 20, creatinine 0.73, glucose 403.  ProB natriuretic peptide on Sep 14, 2010, was 1190 increased as of Sep 17, 2010 to 1439, D-dimer 0.35.  TSH 3.172.  Chest x-ray from Sep 11, 2010, describes stable small pleural effusions, cardiomegaly and vascular congestion without overt edema.  Pacemaker in place.  Small pleural effusions stable.  IMPRESSION: 1. She has a long history of chronic bronchitis and cough which is     multifactorial.  We know she has chronic obstructive pulmonary     disease with extensively bronchitic pattern especially with     reactive changes in small airways which responds to bronchodilator.     I would like to add nebulized steroids for that beyond which she is     getting at her Symbicort. 2. There is a limit to how much more bronchodilator medications we can     give her, current regimen is appropriate.  She has needed Xopenex     in the past because of tremor and sensitivity to stimulation even     with pacemaker in place. 3. Her pro BNP is increasing.  Last chest x-ray showed congestion and     my experience  with her in the past that she has only been entirely     cough free when she has been bone dry to the limits of renal     tolerance.  We likely     will have to accept some chronic cough and congestion.  I will     update chest x-ray, add cough suppressant and nebulized steroid.     Hopefully adding budesonide by nebulizer will not be an additional     stress on her already elevated blood sugar.  Her blood sugar     management may require primary care or endocrine attention.     Clinton D. Maple Hudson, MD, [facility], FACP     CDY/MEDQ  D:  09/18/2010  T:  09/19/2010  Job:  [id number]  Electronically Signed by Jetty Duhamel MD FCCP FACP on 10/28/2010 01:01:44 PM

## 2010-11-10 ENCOUNTER — Encounter: Payer: Medicare Other | Admitting: Cardiology

## 2010-11-10 NOTE — Progress Notes (Signed)
HPI: [name] is a female who has a history of coronary artery disease status post coronary bypassing graft performed in 2000. Her last Myoview was performed in Nov 2011 and showed an EF of 47; prior inferior MI and mild peri-infarct ischemia. Last echocardiogram in February 2011 and showed an EF of 45-50, mild MR and trace AI. She also has a history of permanent atrial fibrillation and has had prior AV node ablation.  There was also a question of prior von Willebrand disease.  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 Recent admissions in May of 2012 with complaints of dyspnea; felt secondary to COPD and CHF; treated with diuretics and pulmonary toilet. Since she was [facility],   Current Outpatient Prescriptions  Medication Sig Dispense Refill  . budesonide-formoterol (SYMBICORT) 160-4.5 MCG/ACT inhaler Inhale 1 puff into the lungs 2 (two) times daily.        . cyanocobalamin (COBAL-1000) 1000 MCG/ML injection Inject 1,000 mcg into the muscle. Take twice a month.       . furosemide (LASIX) 40 MG tablet Take 40 mg by mouth daily.        Marland Kitchen guaiFENesin (MUCINEX) 600 MG 12 hr tablet Take by mouth as needed.        . insulin glargine (LANTUS) 100 UNIT/ML injection Inject 32 Units into the skin every morning.        . insulin lispro (HUMALOG KWIKPEN) 100 UNIT/ML injection Inject into the skin. Give 10 units three times a day if blood sugar less than 225. Give 12 units three times a day if blood sugar 225 or higher.       . levalbuterol (XOPENEX HFA) 45 MCG/ACT inhaler Inhale 1-2 puffs into the lungs 4 (four) times daily.        Marland Kitchen levalbuterol (XOPENEX) 0.31 MG/3ML nebulizer solution as directed.        . metoprolol (TOPROL-XL) 100 MG 24 hr tablet Take 50 mg by mouth daily.        . nitroGLYCERIN (NITROSTAT) 0.4 MG SL tablet Place 0.4 mg under the tongue as needed.        . NON FORMULARY OXYGEN 2 L/M CONT/PORT ADVANCED.        Marland Kitchen pantoprazole (PROTONIX) 40 MG tablet Take 40 mg by mouth daily.        . potassium chloride (K-DUR,KLOR-CON) 10 MEQ tablet Take 10 mEq by mouth daily.        . sertraline (ZOLOFT) 100 MG tablet Take 100 mg by mouth daily.        Marland Kitchen warfarin (COUMADIN) 5 MG tablet Take by mouth as directed.           Past Medical History  Diagnosis Date  . CHF (congestive heart failure)   . Cardiac pacemaker in situ   . Anemia, pernicious   . Atrial fibrillation     S/P AV node ablation  . Von Willebrand's disease   . Gallbladder disease   . Hypothyroidism   . Diabetes mellitus   . CAD (coronary artery disease)   . Acute asthmatic bronchitis   . COPD (chronic obstructive pulmonary disease)     Past Surgical History  Procedure Date  . Coronary artery bypass graft   . Mandibular reconstruction w/ iliac bone graft   . Appendectomy   . Hernia repair     History   Social History  . Marital Status:  Married    Spouse Name: N/A    Number of Children: N/A  . Years of Education: N/A   Occupational History  . Not on file.   Social History Main Topics  . Smoking status: Former Games developer  . Smokeless tobacco: Not on file  . Alcohol Use: Not on file  . Drug Use: Not on file  . Sexually Active: Not on file   Other Topics Concern  . Not on file   Social History Narrative   Lives with daugher    ROS: no fevers or chills, productive cough, hemoptysis, dysphasia, odynophagia, melena, hematochezia, dysuria, hematuria, rash, seizure activity, orthopnea, PND, pedal edema, claudication. Remaining systems are negative.  Physical Exam: Well-developed well-nourished in no acute distress.  Skin is warm and dry.  HEENT is normal.  Neck is supple. No thyromegaly.  Chest is clear to auscultation with normal expansion.  Cardiovascular exam is regular rate and rhythm.  Abdominal exam nontender or distended. No masses palpated. Extremities show no edema. neuro grossly intact  ECG     This  encounter was created in error - please disregard.

## 2010-12-01 ENCOUNTER — Other Ambulatory Visit: Payer: Self-pay | Admitting: Cardiology

## 2010-12-03 ENCOUNTER — Emergency Department (HOSPITAL_COMMUNITY)
Admission: EM | Admit: 2010-12-03 | Discharge: 2010-12-03 | Disposition: A | Discharge status: Home / Self Care | Payer: Medicare Other | Attending: Emergency Medicine | Admitting: Emergency Medicine

## 2010-12-03 ENCOUNTER — Emergency Department (HOSPITAL_COMMUNITY): Payer: Medicare Other

## 2010-12-03 DIAGNOSIS — Z794 Long term (current) use of insulin: Secondary | ICD-10-CM | POA: Insufficient documentation

## 2010-12-03 DIAGNOSIS — X58XXXA Exposure to other specified factors, initial encounter: Secondary | ICD-10-CM | POA: Insufficient documentation

## 2010-12-03 DIAGNOSIS — D6800 Von Willebrand disease, unspecified: Secondary | ICD-10-CM | POA: Insufficient documentation

## 2010-12-03 DIAGNOSIS — Z950 Presence of cardiac pacemaker: Secondary | ICD-10-CM | POA: Insufficient documentation

## 2010-12-03 DIAGNOSIS — Z7901 Long term (current) use of anticoagulants: Secondary | ICD-10-CM | POA: Insufficient documentation

## 2010-12-03 DIAGNOSIS — IMO0002 Reserved for concepts with insufficient information to code with codable children: Secondary | ICD-10-CM | POA: Insufficient documentation

## 2010-12-03 DIAGNOSIS — E669 Obesity, unspecified: Secondary | ICD-10-CM | POA: Insufficient documentation

## 2010-12-03 DIAGNOSIS — Z79899 Other long term (current) drug therapy: Secondary | ICD-10-CM | POA: Insufficient documentation

## 2010-12-03 DIAGNOSIS — I509 Heart failure, unspecified: Secondary | ICD-10-CM | POA: Insufficient documentation

## 2010-12-03 DIAGNOSIS — E119 Type 2 diabetes mellitus without complications: Secondary | ICD-10-CM | POA: Insufficient documentation

## 2010-12-03 DIAGNOSIS — E039 Hypothyroidism, unspecified: Secondary | ICD-10-CM | POA: Insufficient documentation

## 2010-12-03 DIAGNOSIS — R0602 Shortness of breath: Secondary | ICD-10-CM | POA: Insufficient documentation

## 2010-12-03 DIAGNOSIS — I519 Heart disease, unspecified: Secondary | ICD-10-CM | POA: Insufficient documentation

## 2010-12-03 DIAGNOSIS — I10 Essential (primary) hypertension: Secondary | ICD-10-CM | POA: Insufficient documentation

## 2010-12-03 DIAGNOSIS — J4489 Other specified chronic obstructive pulmonary disease: Secondary | ICD-10-CM | POA: Insufficient documentation

## 2010-12-03 DIAGNOSIS — Z951 Presence of aortocoronary bypass graft: Secondary | ICD-10-CM | POA: Insufficient documentation

## 2010-12-03 DIAGNOSIS — F0390 Unspecified dementia without behavioral disturbance: Secondary | ICD-10-CM | POA: Insufficient documentation

## 2010-12-03 DIAGNOSIS — J449 Chronic obstructive pulmonary disease, unspecified: Secondary | ICD-10-CM | POA: Insufficient documentation

## 2010-12-03 LAB — URINALYSIS, ROUTINE W REFLEX MICROSCOPIC
Bilirubin Urine: NEGATIVE
Ketones, ur: NEGATIVE mg/dL
Nitrite: NEGATIVE
Protein, ur: NEGATIVE mg/dL
pH: 6.5 (ref 5.0–8.0)

## 2010-12-03 LAB — BASIC METABOLIC PANEL
GFR calc Af Amer: >60 mL/min (ref >60)
GFR calc non Af Amer: >60 mL/min (ref >60)
Potassium: 3.9 mEq/L (ref 3.5–5.1)
Sodium: 131 mEq/L — ABNORMAL LOW (ref 135–145)

## 2010-12-03 LAB — CBC
HCT: 34.6 % — ABNORMAL LOW (ref 36.0–46.0)
MCH: 23.5 pg — ABNORMAL LOW (ref 26.0–34.0)
MCV: 73.8 fL — ABNORMAL LOW (ref 78.0–100.0)
RBC: 4.69 MIL/uL (ref 3.87–5.11)
RDW: 15.7 % — ABNORMAL HIGH (ref 11.5–15.5)
WBC: 7.4 10*3/uL (ref 4.0–10.5)

## 2010-12-03 LAB — URINE MICROSCOPIC-ADD ON

## 2010-12-03 LAB — POCT I-STAT TROPONIN I: Troponin i, poc: 0 ng/mL (ref 0.00–0.08)

## 2010-12-03 LAB — GLUCOSE, CAPILLARY
Glucose-Capillary: 535 mg/dL — ABNORMAL HIGH (ref 70–99)
Glucose-Capillary: 132 mg/dL — ABNORMAL HIGH (ref 70–99)

## 2010-12-03 LAB — DIFFERENTIAL
Eosinophils Relative: 1 % (ref 0–5)
Lymphocytes Relative: 21 % (ref 12–46)
Lymphs Abs: 1.5 10*3/uL (ref 0.7–4.0)
Monocytes Relative: 7 % (ref 3–12)

## 2010-12-16 ENCOUNTER — Ambulatory Visit (INDEPENDENT_AMBULATORY_CARE_PROVIDER_SITE_OTHER): Payer: Medicare Other | Admitting: *Deleted

## 2010-12-16 DIAGNOSIS — I4891 Unspecified atrial fibrillation: Secondary | ICD-10-CM

## 2010-12-16 DIAGNOSIS — I428 Other cardiomyopathies: Secondary | ICD-10-CM

## 2010-12-24 ENCOUNTER — Encounter: Payer: Self-pay | Admitting: Internal Medicine

## 2010-12-24 ENCOUNTER — Other Ambulatory Visit: Payer: Self-pay | Admitting: Internal Medicine

## 2010-12-24 LAB — REMOTE PACEMAKER DEVICE
BRDY-0002RV: 70 {beats}/min
RV LEAD AMPLITUDE: 11.3 mv
RV LEAD IMPEDENCE PM: 450 Ohm
RV LEAD THRESHOLD: 0.625 V

## 2011-01-05 NOTE — Progress Notes (Signed)
Pacer checked remotely. 

## 2011-01-07 ENCOUNTER — Encounter: Payer: Self-pay | Admitting: *Deleted

## 2011-01-08 ENCOUNTER — Ambulatory Visit: Payer: Self-pay | Admitting: Internal Medicine

## 2011-01-14 LAB — BASIC METABOLIC PANEL
BUN: 19
CO2: 29
Chloride: 100
Creatinine, Ser: 0.91
GFR calc Af Amer: >60

## 2011-01-14 LAB — CBC
Hemoglobin: 14
MCHC: 34.2
RDW: 14.9

## 2011-01-15 LAB — B-NATRIURETIC PEPTIDE (CONVERTED LAB): Pro B Natriuretic peptide (BNP): 121 — ABNORMAL HIGH

## 2011-01-15 LAB — POCT CARDIAC MARKERS
Myoglobin, poc: 82.5
Operator id: 234501

## 2011-01-15 LAB — CARDIAC PANEL(CRET KIN+CKTOT+MB+TROPI)
CK, MB: 2
Relative Index: INVALID
Relative Index: INVALID
Relative Index: INVALID
Total CK: 86
Troponin I: 0.02

## 2011-01-15 LAB — I-STAT 8, (EC8 V) (CONVERTED LAB)
BUN: 17
Bicarbonate: 25.3 — ABNORMAL HIGH
Chloride: 106
Glucose, Bld: 165 — ABNORMAL HIGH
Sodium: 138

## 2011-01-15 LAB — POCT I-STAT CREATININE: Creatinine, Ser: 1

## 2011-01-15 LAB — DIGOXIN LEVEL: Digoxin Level: 0.3 — ABNORMAL LOW

## 2011-01-16 LAB — COMPREHENSIVE METABOLIC PANEL
BUN: 7
CO2: 31
Calcium: 8.8
Creatinine, Ser: 0.94
GFR calc non Af Amer: 57 — ABNORMAL LOW
Glucose, Bld: 233 — ABNORMAL HIGH
Total Protein: 5.2 — ABNORMAL LOW

## 2011-01-16 LAB — CBC
HCT: 32.7 — ABNORMAL LOW
Hemoglobin: 11.2 — ABNORMAL LOW
MCHC: 34.3
MCV: 86.8
RBC: 3.77 — ABNORMAL LOW
RDW: 15.9 — ABNORMAL HIGH

## 2011-01-16 LAB — DIGOXIN LEVEL: Digoxin Level: 1.2

## 2011-01-19 LAB — I-STAT 8, (EC8 V) (CONVERTED LAB)
Acid-Base Excess: 5 — ABNORMAL HIGH
Acid-Base Excess: 5 — ABNORMAL HIGH
BUN: 10
Bicarbonate: 30.2 — ABNORMAL HIGH
Chloride: 101
Chloride: 100
Glucose, Bld: 209 — ABNORMAL HIGH
HCT: 39
HCT: 35 — ABNORMAL LOW
Hemoglobin: 11.9 — ABNORMAL LOW
Operator id: 161631
Operator id: 161631
Potassium: 3.8
Potassium: 3.3 — ABNORMAL LOW
Sodium: 137
TCO2: 32
TCO2: 32
pCO2, Ven: 48.4
pH, Ven: 7.404 — ABNORMAL HIGH
pH, Ven: 7.402 — ABNORMAL HIGH

## 2011-01-19 LAB — POCT CARDIAC MARKERS
CKMB, poc: <1 — ABNORMAL LOW
CKMB, poc: <1 — ABNORMAL LOW
Myoglobin, poc: 43.5
Myoglobin, poc: 41.7
Operator id: 161631
Troponin i, poc: <0.05

## 2011-01-19 LAB — B-NATRIURETIC PEPTIDE (CONVERTED LAB): Pro B Natriuretic peptide (BNP): 145 — ABNORMAL HIGH

## 2011-01-19 LAB — POCT I-STAT CREATININE
Creatinine, Ser: 1.1
Operator id: 161631
Operator id: 161631

## 2011-01-19 LAB — DIGOXIN LEVEL: Digoxin Level: <0.2 — ABNORMAL LOW

## 2011-01-26 LAB — CBC
HCT: 40.9
Hemoglobin: 13.4
RBC: 4.72
WBC: 6.9

## 2011-01-26 LAB — GLUCOSE, CAPILLARY
Glucose-Capillary: 146 — ABNORMAL HIGH
Glucose-Capillary: 249 — ABNORMAL HIGH
Glucose-Capillary: 252 — ABNORMAL HIGH

## 2011-01-26 LAB — COMPREHENSIVE METABOLIC PANEL
ALT: 23
Alkaline Phosphatase: 97
BUN: 13
CO2: 25
Chloride: 107
GFR calc non Af Amer: >60
Glucose, Bld: 147 — ABNORMAL HIGH
Potassium: 4.2
Sodium: 140
Total Bilirubin: 0.7
Total Protein: 6

## 2011-01-26 LAB — PROTIME-INR
INR: 1
Prothrombin Time: 13.9

## 2011-01-26 LAB — LIPID PANEL
Cholesterol: 203 — ABNORMAL HIGH
HDL: 48
LDL Cholesterol: 125 — ABNORMAL HIGH
Triglycerides: 151 — ABNORMAL HIGH

## 2011-01-27 LAB — URINALYSIS, DIPSTICK ONLY
Ketones, ur: NEGATIVE
Leukocytes, UA: NEGATIVE
Nitrite: NEGATIVE
Protein, ur: NEGATIVE
Urobilinogen, UA: 0.2

## 2011-01-27 LAB — BASIC METABOLIC PANEL
BUN: 11
BUN: 14
BUN: 16
BUN: 20
CO2: 26
CO2: 28
CO2: 31
CO2: 31
Calcium: 9.1
Calcium: 9.3
Calcium: 9.5
Chloride: 103
Chloride: 104
Chloride: 105
Chloride: 108
Creatinine, Ser: 0.91
Creatinine, Ser: 0.97
Creatinine, Ser: 1.02
Creatinine, Ser: 1.16
GFR calc Af Amer: 54 — ABNORMAL LOW
GFR calc Af Amer: >60
GFR calc Af Amer: >60
GFR calc Af Amer: >60
GFR calc non Af Amer: 52 — ABNORMAL LOW
GFR calc non Af Amer: 52 — ABNORMAL LOW
GFR calc non Af Amer: 56 — ABNORMAL LOW
Glucose, Bld: 122 — ABNORMAL HIGH
Glucose, Bld: 148 — ABNORMAL HIGH
Glucose, Bld: 203 — ABNORMAL HIGH
Glucose, Bld: 213 — ABNORMAL HIGH
Glucose, Bld: 79
Potassium: 3.6
Potassium: 3.8
Potassium: 3.9
Potassium: 4.7
Sodium: 137
Sodium: 138
Sodium: 139
Sodium: 141

## 2011-01-27 LAB — GLUCOSE, CAPILLARY
Glucose-Capillary: 115 — ABNORMAL HIGH
Glucose-Capillary: 116 — ABNORMAL HIGH
Glucose-Capillary: 120 — ABNORMAL HIGH
Glucose-Capillary: 125 — ABNORMAL HIGH
Glucose-Capillary: 129 — ABNORMAL HIGH
Glucose-Capillary: 139 — ABNORMAL HIGH
Glucose-Capillary: 139 — ABNORMAL HIGH
Glucose-Capillary: 140 — ABNORMAL HIGH
Glucose-Capillary: 143 — ABNORMAL HIGH
Glucose-Capillary: 146 — ABNORMAL HIGH
Glucose-Capillary: 150 — ABNORMAL HIGH
Glucose-Capillary: 150 — ABNORMAL HIGH
Glucose-Capillary: 166 — ABNORMAL HIGH
Glucose-Capillary: 166 — ABNORMAL HIGH
Glucose-Capillary: 175 — ABNORMAL HIGH
Glucose-Capillary: 175 — ABNORMAL HIGH
Glucose-Capillary: 185 — ABNORMAL HIGH
Glucose-Capillary: 186 — ABNORMAL HIGH
Glucose-Capillary: 188 — ABNORMAL HIGH
Glucose-Capillary: 188 — ABNORMAL HIGH
Glucose-Capillary: 189 — ABNORMAL HIGH
Glucose-Capillary: 191 — ABNORMAL HIGH
Glucose-Capillary: 193 — ABNORMAL HIGH
Glucose-Capillary: 195 — ABNORMAL HIGH
Glucose-Capillary: 195 — ABNORMAL HIGH
Glucose-Capillary: 197 — ABNORMAL HIGH
Glucose-Capillary: 208 — ABNORMAL HIGH
Glucose-Capillary: 210 — ABNORMAL HIGH
Glucose-Capillary: 214 — ABNORMAL HIGH
Glucose-Capillary: 215 — ABNORMAL HIGH
Glucose-Capillary: 215 — ABNORMAL HIGH
Glucose-Capillary: 215 — ABNORMAL HIGH
Glucose-Capillary: 216 — ABNORMAL HIGH
Glucose-Capillary: 217 — ABNORMAL HIGH
Glucose-Capillary: 224 — ABNORMAL HIGH
Glucose-Capillary: 229 — ABNORMAL HIGH
Glucose-Capillary: 234 — ABNORMAL HIGH
Glucose-Capillary: 236 — ABNORMAL HIGH
Glucose-Capillary: 237 — ABNORMAL HIGH
Glucose-Capillary: 242 — ABNORMAL HIGH
Glucose-Capillary: 276 — ABNORMAL HIGH
Glucose-Capillary: 282 — ABNORMAL HIGH
Glucose-Capillary: 309 — ABNORMAL HIGH

## 2011-01-27 LAB — CBC
HCT: 38
HCT: 38.6
HCT: 39.2
HCT: 40
HCT: 40.4
HCT: 40.6
HCT: 40.8
HCT: 41.4
HCT: 42
HCT: 43.8
HCT: 43.8
Hemoglobin: 13
Hemoglobin: 13.2
Hemoglobin: 13.3
Hemoglobin: 13.5
Hemoglobin: 13.6
Hemoglobin: 13.6
Hemoglobin: 13.6
Hemoglobin: 13.8
Hemoglobin: 14.1
Hemoglobin: 14.5
Hemoglobin: 14.8
MCHC: 32.6
MCHC: 33.1
MCHC: 33.2
MCHC: 33.4
MCHC: 33.4
MCHC: 33.7
MCHC: 33.7
MCHC: 33.8
MCHC: 33.9
MCHC: 34
MCV: 86.2
MCV: 86.6
MCV: 86.6
MCV: 87.2
MCV: 87.3
MCV: 87.3
MCV: 87.3
MCV: 88.2
MCV: 88.3
Platelets: 228
Platelets: 234
Platelets: 236
Platelets: 250
Platelets: 253
Platelets: 257
Platelets: 264
Platelets: 266
Platelets: 276
Platelets: 310
RBC: 4.58
RBC: 4.58
RBC: 4.62
RBC: 4.7
RBC: 4.74
RBC: 4.76
RBC: 4.84
RBC: 4.85
RBC: 5.02
RDW: 15.7 — ABNORMAL HIGH
RDW: 15.8 — ABNORMAL HIGH
RDW: 15.8 — ABNORMAL HIGH
RDW: 15.8 — ABNORMAL HIGH
RDW: 16 — ABNORMAL HIGH
RDW: 16 — ABNORMAL HIGH
RDW: 16.2 — ABNORMAL HIGH
RDW: 16.3 — ABNORMAL HIGH
RDW: 16.4 — ABNORMAL HIGH
RDW: 16.4 — ABNORMAL HIGH
RDW: 16.4 — ABNORMAL HIGH
RDW: 16.5 — ABNORMAL HIGH
RDW: 16.5 — ABNORMAL HIGH
WBC: 6.5
WBC: 6.5
WBC: 6.8
WBC: 7
WBC: 7.4
WBC: 7.4
WBC: 7.4
WBC: 7.6
WBC: 8.4
WBC: 8.6
WBC: 9
WBC: 9.4

## 2011-01-27 LAB — URINE CULTURE
Colony Count: >=100000
Colony Count: >=100000
Special Requests: POSITIVE

## 2011-01-27 LAB — HEPARIN LEVEL (UNFRACTIONATED)
Heparin Unfractionated: 0.42
Heparin Unfractionated: 0.44
Heparin Unfractionated: 0.46
Heparin Unfractionated: 0.46
Heparin Unfractionated: 0.56
Heparin Unfractionated: 0.59
Heparin Unfractionated: 0.69
Heparin Unfractionated: 0.73 — ABNORMAL HIGH
Heparin Unfractionated: 0.74 — ABNORMAL HIGH

## 2011-01-27 LAB — PROTIME-INR
INR: 1
INR: 1
INR: 1.3
INR: 1.4
INR: 2 — ABNORMAL HIGH
Prothrombin Time: 13.4
Prothrombin Time: 14.2
Prothrombin Time: 16.2 — ABNORMAL HIGH
Prothrombin Time: 24 — ABNORMAL HIGH

## 2011-01-27 LAB — CARDIAC PANEL(CRET KIN+CKTOT+MB+TROPI)
CK, MB: 2.1
CK, MB: 2.4
CK, MB: 2.7
Relative Index: 1.8
Relative Index: 1.8
Total CK: 131
Troponin I: 0.01

## 2011-01-27 LAB — URINALYSIS, MICROSCOPIC ONLY
Bilirubin Urine: NEGATIVE
Nitrite: NEGATIVE
Protein, ur: NEGATIVE
Specific Gravity, Urine: 1.01
Urobilinogen, UA: 0.2

## 2011-01-27 LAB — VON WILLEBRAND PANEL
Factor-VIII Activity: 101 % (ref 50–150)
Von Willebrand Ag: 83 % normal (ref 61–164)

## 2011-01-27 LAB — DIGOXIN LEVEL: Digoxin Level: 0.7 — ABNORMAL LOW

## 2011-01-27 LAB — B-NATRIURETIC PEPTIDE (CONVERTED LAB): Pro B Natriuretic peptide (BNP): 287 — ABNORMAL HIGH

## 2011-01-30 LAB — CBC
HCT: 38.4 % (ref 36.0–46.0)
HCT: 39.3 % (ref 36.0–46.0)
HCT: 39.3 % (ref 36.0–46.0)
HCT: 39.9 % (ref 36.0–46.0)
HCT: 42.9 % (ref 36.0–46.0)
HCT: 33 — ABNORMAL LOW
HCT: 35.4 — ABNORMAL LOW
HCT: 36.3
HCT: 40.4
Hemoglobin: 12.8 g/dL (ref 12.0–15.0)
Hemoglobin: 13.1 g/dL (ref 12.0–15.0)
Hemoglobin: 13.3 g/dL (ref 12.0–15.0)
Hemoglobin: 13.8 g/dL (ref 12.0–15.0)
Hemoglobin: 10.5 — ABNORMAL LOW
Hemoglobin: 11.3 — ABNORMAL LOW
Hemoglobin: 12
Hemoglobin: 12.4
MCHC: 33.4 g/dL (ref 30.0–36.0)
MCHC: 33.8 g/dL (ref 30.0–36.0)
MCHC: 34 g/dL (ref 30.0–36.0)
MCHC: 32.8
MCHC: 33.1
MCHC: 34.3
MCV: 86.9 fL (ref 78.0–100.0)
MCV: 87.1 fL (ref 78.0–100.0)
MCV: 88.1 fL (ref 78.0–100.0)
MCV: 88.4 fL (ref 78.0–100.0)
MCV: 84.7
MCV: 86.1
MCV: 87.5
Platelets: 223 10*3/uL (ref 150–400)
Platelets: 232 10*3/uL (ref 150–400)
Platelets: 236 10*3/uL (ref 150–400)
Platelets: 259 10*3/uL (ref 150–400)
Platelets: 246
Platelets: 277
Platelets: 322
RBC: 4.42 MIL/uL (ref 3.87–5.11)
RBC: 4.69 MIL/uL (ref 3.87–5.11)
RBC: 3.65 — ABNORMAL LOW
RBC: 4.11
RBC: 4.43
RBC: 4.61
RDW: 15.3 % (ref 11.5–15.5)
RDW: 15.7 % — ABNORMAL HIGH (ref 11.5–15.5)
RDW: 15.7 % — ABNORMAL HIGH (ref 11.5–15.5)
RDW: 14.3
RDW: 14.4
RDW: 14.4
WBC: 5.4 10*3/uL (ref 4.0–10.5)
WBC: 6 10*3/uL (ref 4.0–10.5)
WBC: 6.4 10*3/uL (ref 4.0–10.5)
WBC: 6.7 10*3/uL (ref 4.0–10.5)
WBC: 9.9 10*3/uL (ref 4.0–10.5)
WBC: 10.5
WBC: 6.8
WBC: 7
WBC: 7.6
WBC: 9.6

## 2011-01-30 LAB — GLUCOSE, CAPILLARY
Glucose-Capillary: 148 mg/dL — ABNORMAL HIGH (ref 70–99)
Glucose-Capillary: 150 mg/dL — ABNORMAL HIGH (ref 70–99)
Glucose-Capillary: 156 mg/dL — ABNORMAL HIGH (ref 70–99)
Glucose-Capillary: 161 mg/dL — ABNORMAL HIGH (ref 70–99)
Glucose-Capillary: 170 mg/dL — ABNORMAL HIGH (ref 70–99)
Glucose-Capillary: 207 mg/dL — ABNORMAL HIGH (ref 70–99)
Glucose-Capillary: 211 mg/dL — ABNORMAL HIGH (ref 70–99)
Glucose-Capillary: 218 mg/dL — ABNORMAL HIGH (ref 70–99)
Glucose-Capillary: 220 mg/dL — ABNORMAL HIGH (ref 70–99)
Glucose-Capillary: 222 mg/dL — ABNORMAL HIGH (ref 70–99)
Glucose-Capillary: 269 mg/dL — ABNORMAL HIGH (ref 70–99)
Glucose-Capillary: 400 mg/dL — ABNORMAL HIGH (ref 70–99)

## 2011-01-30 LAB — BASIC METABOLIC PANEL
BUN: 13 mg/dL (ref 6–23)
BUN: 14 mg/dL (ref 6–23)
BUN: 17 mg/dL (ref 6–23)
BUN: 17
BUN: 18
BUN: 22
BUN: 27 — ABNORMAL HIGH
CO2: 28 mEq/L (ref 19–32)
CO2: 34 mEq/L — ABNORMAL HIGH (ref 19–32)
CO2: 34 mEq/L — ABNORMAL HIGH (ref 19–32)
CO2: 30
Calcium: 9 mg/dL (ref 8.4–10.5)
Calcium: 9.2 mg/dL (ref 8.4–10.5)
Calcium: 9.2
Calcium: 9.2
Calcium: 9.4
Chloride: 101 mEq/L (ref 96–112)
Chloride: 103 mEq/L (ref 96–112)
Chloride: 97 mEq/L (ref 96–112)
Chloride: 101
Chloride: 103
Chloride: 104
Creatinine, Ser: 0.9 mg/dL (ref 0.4–1.2)
Creatinine, Ser: 1.06 mg/dL (ref 0.4–1.2)
Creatinine, Ser: 0.84
Creatinine, Ser: 0.91
Creatinine, Ser: 0.97
GFR calc Af Amer: 56 mL/min — ABNORMAL LOW (ref >60)
GFR calc Af Amer: >60 mL/min (ref >60)
GFR calc Af Amer: >60
GFR calc Af Amer: >60
GFR calc Af Amer: >60
GFR calc Af Amer: >60
GFR calc Af Amer: >60
GFR calc non Af Amer: 46 mL/min — ABNORMAL LOW (ref >60)
GFR calc non Af Amer: 55 — ABNORMAL LOW
GFR calc non Af Amer: 58 — ABNORMAL LOW
GFR calc non Af Amer: 59 — ABNORMAL LOW
GFR calc non Af Amer: >60
GFR calc non Af Amer: >60
GFR calc non Af Amer: >60
Glucose, Bld: 143 mg/dL — ABNORMAL HIGH (ref 70–99)
Glucose, Bld: 163 mg/dL — ABNORMAL HIGH (ref 70–99)
Glucose, Bld: 132 — ABNORMAL HIGH
Glucose, Bld: 155 — ABNORMAL HIGH
Potassium: 3.4 mEq/L — ABNORMAL LOW (ref 3.5–5.1)
Potassium: 3.7 mEq/L (ref 3.5–5.1)
Potassium: 3.7 mEq/L (ref 3.5–5.1)
Potassium: 4.1
Potassium: 4.2
Potassium: 4.7
Sodium: 143 mEq/L (ref 135–145)
Sodium: 138
Sodium: 139
Sodium: 141
Sodium: 141

## 2011-01-30 LAB — DIGOXIN LEVEL
Digoxin Level: <0.2 ng/mL — ABNORMAL LOW (ref 0.8–2.0)
Digoxin Level: <0.2 — ABNORMAL LOW

## 2011-01-30 LAB — DIFFERENTIAL
Basophils Absolute: 0 10*3/uL (ref 0.0–0.1)
Basophils Relative: 1 % (ref 0–1)
Eosinophils Absolute: 0.2 10*3/uL (ref 0.0–0.7)
Eosinophils Relative: 1 % (ref 0–5)
Lymphocytes Relative: 12 % (ref 12–46)
Lymphs Abs: 1.1 10*3/uL (ref 0.7–4.0)
Lymphs Abs: 2.2 10*3/uL (ref 0.7–4.0)
Lymphs Abs: 1.3
Monocytes Absolute: 0.5 10*3/uL (ref 0.1–1.0)
Monocytes Relative: 5 % (ref 3–12)
Monocytes Relative: 8 % (ref 3–12)
Monocytes Relative: 5
Neutro Abs: 5.1 10*3/uL (ref 1.7–7.7)
Neutro Abs: 8.2 10*3/uL — ABNORMAL HIGH (ref 1.7–7.7)
Neutro Abs: 8.8 — ABNORMAL HIGH
Neutrophils Relative %: 63 % (ref 43–77)
Neutrophils Relative %: 82 — ABNORMAL HIGH

## 2011-01-30 LAB — COMPREHENSIVE METABOLIC PANEL
ALT: 50 U/L — ABNORMAL HIGH (ref 0–35)
AST: 26
Alkaline Phosphatase: 90 U/L (ref 39–117)
BUN: 13 mg/dL (ref 6–23)
BUN: 22
BUN: 23
CO2: 29 mEq/L (ref 19–32)
CO2: 25
CO2: 27
Calcium: 8.8 mg/dL (ref 8.4–10.5)
Calcium: 9
Calcium: 9.7
Chloride: 99
Creatinine, Ser: 0.77
Creatinine, Ser: 0.89
GFR calc Af Amer: >60
GFR calc non Af Amer: 44 mL/min — ABNORMAL LOW (ref >60)
GFR calc non Af Amer: >60
GFR calc non Af Amer: >60
Glucose, Bld: 190 mg/dL — ABNORMAL HIGH (ref 70–99)
Glucose, Bld: 220 — ABNORMAL HIGH
Total Bilirubin: 0.8
Total Protein: 5.7 g/dL — ABNORMAL LOW (ref 6.0–8.3)
Total Protein: 6

## 2011-01-30 LAB — LIPID PANEL
Cholesterol: 166 mg/dL (ref 0–200)
HDL: 42 mg/dL (ref >39)
LDL Cholesterol: 102 mg/dL — ABNORMAL HIGH (ref 0–99)

## 2011-01-30 LAB — PROTIME-INR
INR: 1 (ref 0.00–1.49)
INR: 1.3 (ref 0.00–1.49)
INR: 1.5 (ref 0.00–1.49)
INR: 0.9
Prothrombin Time: 13.8 seconds (ref 11.6–15.2)
Prothrombin Time: 18.6 seconds — ABNORMAL HIGH (ref 11.6–15.2)
Prothrombin Time: 19.6 seconds — ABNORMAL HIGH (ref 11.6–15.2)
Prothrombin Time: 19.8 seconds — ABNORMAL HIGH (ref 11.6–15.2)
Prothrombin Time: 27.5 seconds — ABNORMAL HIGH (ref 11.6–15.2)
Prothrombin Time: 12.8

## 2011-01-30 LAB — POCT CARDIAC MARKERS
CKMB, poc: 1.2 ng/mL (ref 1.0–8.0)
CKMB, poc: <1 — ABNORMAL LOW
Operator id: 234501
Troponin i, poc: <0.05 ng/mL (ref 0.00–0.09)
Troponin i, poc: <0.05

## 2011-01-30 LAB — HEPATIC FUNCTION PANEL
ALT: 54 U/L — ABNORMAL HIGH (ref 0–35)
AST: 40 U/L — ABNORMAL HIGH (ref 0–37)
Alkaline Phosphatase: 90 U/L (ref 39–117)
Bilirubin, Direct: 0.3 mg/dL (ref 0.0–0.3)

## 2011-01-30 LAB — RPR: RPR Ser Ql: NONREACTIVE

## 2011-01-30 LAB — B-NATRIURETIC PEPTIDE (CONVERTED LAB)
Pro B Natriuretic peptide (BNP): 162 — ABNORMAL HIGH
Pro B Natriuretic peptide (BNP): 86

## 2011-01-30 LAB — GLUCOSE, RANDOM: Glucose, Bld: 350 — ABNORMAL HIGH

## 2011-01-30 LAB — CARDIAC PANEL(CRET KIN+CKTOT+MB+TROPI)
Relative Index: INVALID
Relative Index: INVALID
Total CK: 85
Troponin I: 0.01
Troponin I: 0.03

## 2011-01-30 LAB — FOLATE RBC: RBC Folate: 911 ng/mL — ABNORMAL HIGH (ref 180–600)

## 2011-01-30 LAB — LIPASE, BLOOD: Lipase: 41 U/L (ref 11–59)

## 2011-01-30 LAB — CK TOTAL AND CKMB (NOT AT ARMC)
CK, MB: 2.3 ng/mL (ref 0.3–4.0)
Total CK: 196 U/L — ABNORMAL HIGH (ref 7–177)

## 2011-01-30 LAB — URINALYSIS, ROUTINE W REFLEX MICROSCOPIC
Glucose, UA: 100 mg/dL — AB
Nitrite: NEGATIVE
Protein, ur: NEGATIVE mg/dL

## 2011-01-30 LAB — MAGNESIUM
Magnesium: 1.9 mg/dL (ref 1.5–2.5)
Magnesium: 2

## 2011-01-30 LAB — URINE MICROSCOPIC-ADD ON

## 2011-01-30 LAB — IGE: IgE (Immunoglobulin E), Serum: 31.5

## 2011-01-30 LAB — APTT
aPTT: 40 seconds — ABNORMAL HIGH (ref 24–37)
aPTT: 28

## 2011-02-02 LAB — DIFFERENTIAL
Eosinophils Absolute: 0 — ABNORMAL LOW
Eosinophils Relative: 0
Lymphs Abs: 1.2
Monocytes Absolute: 0.6
Monocytes Relative: 7
Neutrophils Relative %: 78 — ABNORMAL HIGH

## 2011-02-02 LAB — TSH: TSH: 2.679

## 2011-02-02 LAB — COMPREHENSIVE METABOLIC PANEL
ALT: 97 — ABNORMAL HIGH
Alkaline Phosphatase: 99
BUN: 12
CO2: 29
Calcium: 8.3 — ABNORMAL LOW
GFR calc non Af Amer: >60
Glucose, Bld: 212 — ABNORMAL HIGH
Sodium: 145

## 2011-02-02 LAB — HEMOGLOBIN A1C: Mean Plasma Glucose: 165

## 2011-02-02 LAB — CBC
HCT: 31.9 — ABNORMAL LOW
Hemoglobin: 10.8 — ABNORMAL LOW
MCV: 86.2
RBC: 3.7 — ABNORMAL LOW
WBC: 8

## 2011-02-02 LAB — LIPID PANEL
Cholesterol: 154
Total CHOL/HDL Ratio: 3
VLDL: 21

## 2011-02-02 LAB — CARDIAC PANEL(CRET KIN+CKTOT+MB+TROPI)
CK, MB: 3.2
Relative Index: 2.1
Total CK: 161
Total CK: 178 — ABNORMAL HIGH
Troponin I: 0.02

## 2011-02-02 LAB — MAGNESIUM: Magnesium: 1.9

## 2011-02-02 LAB — BASIC METABOLIC PANEL
Chloride: 108
GFR calc Af Amer: 56 — ABNORMAL LOW
Potassium: 5

## 2011-02-13 ENCOUNTER — Ambulatory Visit: Payer: Self-pay | Admitting: Internal Medicine

## 2011-02-18 ENCOUNTER — Emergency Department (HOSPITAL_COMMUNITY): Payer: Medicare Other

## 2011-02-18 ENCOUNTER — Inpatient Hospital Stay (HOSPITAL_COMMUNITY)
Admission: EM | Admit: 2011-02-18 | Discharge: 2011-02-21 | DRG: 638 | Disposition: A | Discharge status: Home Health | Payer: Medicare Other | Attending: Internal Medicine | Admitting: Internal Medicine

## 2011-02-18 DIAGNOSIS — E7800 Pure hypercholesterolemia, unspecified: Secondary | ICD-10-CM | POA: Diagnosis present

## 2011-02-18 DIAGNOSIS — E1142 Type 2 diabetes mellitus with diabetic polyneuropathy: Secondary | ICD-10-CM | POA: Diagnosis present

## 2011-02-18 DIAGNOSIS — I5042 Chronic combined systolic (congestive) and diastolic (congestive) heart failure: Secondary | ICD-10-CM | POA: Diagnosis present

## 2011-02-18 DIAGNOSIS — I2510 Atherosclerotic heart disease of native coronary artery without angina pectoris: Secondary | ICD-10-CM | POA: Diagnosis present

## 2011-02-18 DIAGNOSIS — J449 Chronic obstructive pulmonary disease, unspecified: Secondary | ICD-10-CM | POA: Diagnosis present

## 2011-02-18 DIAGNOSIS — I10 Essential (primary) hypertension: Secondary | ICD-10-CM | POA: Diagnosis present

## 2011-02-18 DIAGNOSIS — K219 Gastro-esophageal reflux disease without esophagitis: Secondary | ICD-10-CM | POA: Diagnosis present

## 2011-02-18 DIAGNOSIS — E876 Hypokalemia: Secondary | ICD-10-CM | POA: Diagnosis present

## 2011-02-18 DIAGNOSIS — J4489 Other specified chronic obstructive pulmonary disease: Secondary | ICD-10-CM | POA: Diagnosis present

## 2011-02-18 DIAGNOSIS — E1149 Type 2 diabetes mellitus with other diabetic neurological complication: Secondary | ICD-10-CM | POA: Diagnosis present

## 2011-02-18 DIAGNOSIS — E785 Hyperlipidemia, unspecified: Secondary | ICD-10-CM | POA: Diagnosis present

## 2011-02-18 DIAGNOSIS — G4700 Insomnia, unspecified: Secondary | ICD-10-CM | POA: Diagnosis present

## 2011-02-18 DIAGNOSIS — F3289 Other specified depressive episodes: Secondary | ICD-10-CM | POA: Diagnosis present

## 2011-02-18 DIAGNOSIS — F0390 Unspecified dementia without behavioral disturbance: Secondary | ICD-10-CM | POA: Diagnosis present

## 2011-02-18 DIAGNOSIS — E039 Hypothyroidism, unspecified: Secondary | ICD-10-CM | POA: Diagnosis present

## 2011-02-18 DIAGNOSIS — I4891 Unspecified atrial fibrillation: Secondary | ICD-10-CM | POA: Diagnosis present

## 2011-02-18 DIAGNOSIS — E538 Deficiency of other specified B group vitamins: Secondary | ICD-10-CM | POA: Diagnosis present

## 2011-02-18 DIAGNOSIS — Z7901 Long term (current) use of anticoagulants: Secondary | ICD-10-CM

## 2011-02-18 DIAGNOSIS — Z794 Long term (current) use of insulin: Secondary | ICD-10-CM

## 2011-02-18 DIAGNOSIS — Z79899 Other long term (current) drug therapy: Secondary | ICD-10-CM

## 2011-02-18 DIAGNOSIS — F329 Major depressive disorder, single episode, unspecified: Secondary | ICD-10-CM | POA: Diagnosis present

## 2011-02-18 DIAGNOSIS — E1100 Type 2 diabetes mellitus with hyperosmolarity without nonketotic hyperglycemic-hyperosmolar coma (NKHHC): Principal | ICD-10-CM | POA: Diagnosis present

## 2011-02-18 DIAGNOSIS — Z7982 Long term (current) use of aspirin: Secondary | ICD-10-CM

## 2011-02-18 DIAGNOSIS — I509 Heart failure, unspecified: Secondary | ICD-10-CM | POA: Diagnosis present

## 2011-02-18 DIAGNOSIS — Z950 Presence of cardiac pacemaker: Secondary | ICD-10-CM

## 2011-02-18 DIAGNOSIS — Z951 Presence of aortocoronary bypass graft: Secondary | ICD-10-CM

## 2011-02-18 LAB — DIFFERENTIAL
Eosinophils Relative: 1 % (ref 0–5)
Lymphocytes Relative: 27 % (ref 12–46)
Lymphs Abs: 1.8 10*3/uL (ref 0.7–4.0)

## 2011-02-18 LAB — CBC
HCT: 36 % (ref 36.0–46.0)
MCV: 75.3 fL — ABNORMAL LOW (ref 78.0–100.0)
RDW: 17.9 % — ABNORMAL HIGH (ref 11.5–15.5)
WBC: 6.9 10*3/uL (ref 4.0–10.5)

## 2011-02-18 LAB — POCT I-STAT TROPONIN I

## 2011-02-19 ENCOUNTER — Emergency Department (HOSPITAL_COMMUNITY): Payer: Medicare Other

## 2011-02-19 ENCOUNTER — Encounter (HOSPITAL_COMMUNITY): Payer: Self-pay | Admitting: Radiology

## 2011-02-19 ENCOUNTER — Inpatient Hospital Stay (HOSPITAL_COMMUNITY): Payer: Medicare Other

## 2011-02-19 DIAGNOSIS — I059 Rheumatic mitral valve disease, unspecified: Secondary | ICD-10-CM

## 2011-02-19 LAB — URINALYSIS, ROUTINE W REFLEX MICROSCOPIC
Ketones, ur: 15 mg/dL — AB
Leukocytes, UA: NEGATIVE
Nitrite: NEGATIVE
Protein, ur: NEGATIVE mg/dL
pH: 6 (ref 5.0–8.0)

## 2011-02-19 LAB — COMPREHENSIVE METABOLIC PANEL
ALT: 25 U/L (ref 0–35)
AST: 34 U/L (ref 0–37)
Alkaline Phosphatase: 107 U/L (ref 39–117)
CO2: 28 mEq/L (ref 19–32)
Chloride: 95 mEq/L — ABNORMAL LOW (ref 96–112)
Creatinine, Ser: 0.87 mg/dL (ref 0.50–1.10)
GFR calc non Af Amer: 59 mL/min — ABNORMAL LOW (ref >90)
Sodium: 134 mEq/L — ABNORMAL LOW (ref 135–145)
Total Bilirubin: 0.3 mg/dL (ref 0.3–1.2)

## 2011-02-19 LAB — CARDIAC PANEL(CRET KIN+CKTOT+MB+TROPI)
Relative Index: 2.2 (ref 0.0–2.5)
Total CK: 169 U/L (ref 7–177)
Total CK: 206 U/L — ABNORMAL HIGH (ref 7–177)
Troponin I: <0.3 ng/mL (ref <0.30)

## 2011-02-19 LAB — GLUCOSE, CAPILLARY: Glucose-Capillary: 266 mg/dL — ABNORMAL HIGH (ref 70–99)

## 2011-02-19 LAB — PRO B NATRIURETIC PEPTIDE: Pro B Natriuretic peptide (BNP): 1091 pg/mL — ABNORMAL HIGH (ref 0–450)

## 2011-02-19 LAB — MAGNESIUM: Magnesium: 1.6 mg/dL (ref 1.5–2.5)

## 2011-02-19 LAB — HEMOGLOBIN A1C: Mean Plasma Glucose: 315 mg/dL — ABNORMAL HIGH (ref <117)

## 2011-02-19 LAB — APTT: aPTT: 40 seconds — ABNORMAL HIGH (ref 24–37)

## 2011-02-19 LAB — TROPONIN I: Troponin I: <0.3 ng/mL (ref <0.30)

## 2011-02-19 LAB — URINE MICROSCOPIC-ADD ON

## 2011-02-19 MED ORDER — IOHEXOL 350 MG/ML SOLN
50.0000 mL | Freq: Once | INTRAVENOUS | Status: AC | PRN
  Administered 2011-02-19: 50 mL via INTRAVENOUS

## 2011-02-19 NOTE — H&P (Signed)
[name], [name] NO.:  [id number]  MEDICAL RECORD NO.:  [id number]  LOCATION:  [facility]                         FACILITY:  [facility]  PHYSICIAN:  Talmage Nap, MD  DATE OF BIRTH:  05-28-1924  DATE OF ADMISSION:  02/19/2011 DATE OF DISCHARGE:                             HISTORY & PHYSICAL   PRIMARY CARE PHYSICIAN:  Junious Dresser, MD  PRIMARY PULMONOLOGIST:  Rennis Chris. Maple Hudson, MD, FCCP, FACP  PRIMARY CARDIOLOGIST:  [name]. Jens Som, MD, [facility]  History essentially obtainable from patient.  CHIEF COMPLAINT:  Dizzy spell and blurry vision, and painful ambulation of unspecified duration.  Patient is an 77 year old Caucasian female with history of coronary artery disease status post CABG, atrial fibrillation on anticoagulation status post catheter ablation, status post pacemaker, was seen by me in the emergency room with a blurred vision, dizzy spell, and difficulty in walking of unspecified duration.  The patient claimed then for unspecified duration, she has been experiencing dizzy spell and with some blurred vision, this was also said to be associated with occasional difficulty in ambulation and this was said to be getting progressively worse.  She denied any history of chest pain.  She denied any history of shortness of breath.  She denied any fever, she denied any chills, she denied any rigors.  The patient denied any history of nausea or vomiting, symptoms were said to be getting progressively worse and she was forced by her daughter to come to the emergency room to be evaluated.  PAST MEDICAL HISTORY:  Positive for: 1. Congestive heart failure (diastolic dysfunction). 2. Atrial fibrillation. 3. Diabetes mellitus. 4. Hypertension. 5. Hyperlipidemia. 6. Von Willebrand disease. 7. Hypothyroidism. 8. Dementia.  PAST SURGICAL HISTORY: 1. Coronary artery disease status post CABG. 2. Atrial fibrillation status post catheter ablation, status post    pacemaker on anticoagulation long term with Coumadin.  Preadmission meds without dosages include: 1. Lasix. 2. Coumadin. 3. Crestor. 4. Cyanocobalamin. 5. Diltiazem. 6. KCl. 7. Levothyroxine. 8. Metoprolol tartrate. 9. Potassium chloride. 10.Centrum.  ALLERGIES: 1. ATORVASTATIN. 2. LABETALOL. 3. SULFA. 4. ASPIRIN. 5. MACROLIDE.  SOCIAL HISTORY:  Negative for alcohol, tobacco use and patient lives at home with her daughter.  FAMILY HISTORY:  Negative for any coronary artery disease, congestive heart failure.  REVIEW OF SYSTEMS:  The patient denies any history of headaches as she complained about blurry vision with occasional dizzy spell and also shortness of breath.  She denied any associated chest pain.  No nausea or vomiting.  No fever, no chills, no rigor.  Denies any cough.  No abdominal discomfort.  No diarrhea or hematochezia.  No dysuria, hematuria.  Complained of occasional pain with difficulty in walking and then some tingling sensation in the lower extremities.  No intolerance to heat or cold and no neuropsychiatric disorder.  PHYSICAL EXAMINATION:  GENERAL:  Elderly lady, severely dehydrated, not in any obvious respiratory distress. VITAL SIGNS:  At present, vital signs, blood pressure is 146/70, pulse is 74, respiratory rate 18, temperature is 98.6. HEENT:  Pupils are reactive to light.  Extraocular muscles are intact. NECK:  She has  no jugular venous distention.  No carotid bruit.  No lymphadenopathy.  CHEST:  Clear to auscultation. HEART:  Heart sounds are 1 and 2. ABDOMEN:  Obese, organs not palpable.  Bowel sounds are positive. EXTREMITIES:  No pedal edema. NEUROLOGIC:  Did not show any neuro lateralizing signs. NEUROPSYCHIATRIC EVALUATION:  Unremarkable. SKIN:  Showed markedly decreased turgor.  LABORATORY DATA:  First set of cardiac marker, troponin-I 0.00. Complete blood count with differential showed WBC of 6.9, hemoglobin of 11.3, hematocrit  36.0, MCV of 75.3, platelet count of 220 with normal differentials.  Coagulation profile showed PT 19.8, INR 1.65, and a PTT of 40.  Comprehensive metabolic panel showed sodium of 134, potassium of 3.4, chloride of 95 with a bicarb of 28, glucose is 417, BUN is 14, creatinine 0.87.  LFTs are normal.  Lipase 53.  Urinalysis unremarkable. Urine microscopy, unremarkable.  Imaging studies done include chest x-ray which showed vascular congestion with mild cardiomegaly without any significant pulmonary edema.  The findings of COPD and there is dense calcification along the proximal abdominal aorta.  CT of the head without contrast showed vague focus of death attenuation within the higher right parietal lobe extending to the edge of the white matter.  This could reflect a small acute infarct.  There is no evidence of hemorrhagic transformation. There is also vague hypoattenutation within the right cerebral hemisphere and pons which may be artifactual in nature and there is also diffuse small vessel ischemic microangiopathy.  IMPRESSION: 1. Questionable cerebral cerebrovascular accident. 2. Nonketotic hyperosmolar hyperglycemia. 3. Dehydration. 4. Coronary artery disease status post coronary artery bypass graft. 5. Atrial fibrillation status post catheter ablation, status post     pacemaker on chronic anticoagulation with Coumadin. 6. Chronic obstructive pulmonary disease. 7. Hypertension. 8. History of congestive heart failure (diastolic dysfunction). 9. Hypothyroidism. 10.Hyperlipidemia. 11.Dementia. 12.History of von Willebrand's disease.  Plan is to admit patient to neuro tele 3000.  The patient will be on diltiazem 120 mg p.o. daily, Coumadin p.o. daily dosing to be done by pharmacy, and also will be on pravastatin 10 mg p.o. daily.  Since patient is dehydrated and has nonketotic hyperosmolar hyperglycemia, she will be gradually rehydrated with normal saline IV to go at rate of  65 mL an hour.  She will be on Accu-Cheks 4 hourly with regular insulin sliding scale (moderate scale) and she also be given lactulose insulin 44 units subcu at bedtime.  Other medication to be given to patient will include KCl 20 mEq p.o. b.i.d.  For hypothyroidism, patient will be restarted on Synthroid 200 mcg p.o. daily, she will be on O2 via nasal cannula 3 L/minute.  Since the patient also has a history of COPD, she will be on albuterol and Atrovent nebs q.4 hours p.r.n. for shortness of breath and wheezing.  GI prophylaxis will be done with Protonix 40 mg IV q.24.  Further workup to be done on this patient will include cardiac enzymes q.6 x3, BNP stat, chest x-ray stat if not already done, 2D echo and carotid duplex, and also patient will strictly follow the stroke admission orders.  CBC, CMP, and magnesium will be repeated in a.m.  The patient also have PT, PTT, and INR done in a.m. as well as hemoglobin A1c.  She will be followed and evaluated on day-to-day basis.     Talmage Nap, MD     CN/MEDQ  D:  02/19/2011  T:  02/19/2011  Job:  [id number]  Electronically Signed by Talmage Nap  on 02/19/2011 06:42:21 PM

## 2011-02-20 ENCOUNTER — Telehealth: Payer: Self-pay | Admitting: Cardiology

## 2011-02-20 LAB — BASIC METABOLIC PANEL
BUN: 10 mg/dL (ref 6–23)
Calcium: 9.1 mg/dL (ref 8.4–10.5)
GFR calc Af Amer: 87 mL/min — ABNORMAL LOW (ref >90)
GFR calc non Af Amer: 75 mL/min — ABNORMAL LOW (ref >90)
Potassium: 3.3 mEq/L — ABNORMAL LOW (ref 3.5–5.1)
Sodium: 140 mEq/L (ref 135–145)

## 2011-02-20 LAB — URINE CULTURE: Colony Count: 80000

## 2011-02-20 LAB — CBC
MCH: 23.4 pg — ABNORMAL LOW (ref 26.0–34.0)
Platelets: 173 10*3/uL (ref 150–400)
RBC: 4.31 MIL/uL (ref 3.87–5.11)
RDW: 17.9 % — ABNORMAL HIGH (ref 11.5–15.5)
WBC: 4.4 10*3/uL (ref 4.0–10.5)

## 2011-02-20 LAB — TSH: TSH: 1.654 u[IU]/mL (ref 0.350–4.500)

## 2011-02-20 LAB — LIPID PANEL
HDL: 46 mg/dL (ref >39)
LDL Cholesterol: 44 mg/dL (ref 0–99)
Total CHOL/HDL Ratio: 2.5 RATIO
VLDL: 24 mg/dL (ref 0–40)

## 2011-02-20 LAB — GLUCOSE, CAPILLARY: Glucose-Capillary: 210 mg/dL — ABNORMAL HIGH (ref 70–99)

## 2011-02-20 LAB — PROTIME-INR: Prothrombin Time: 24.3 seconds — ABNORMAL HIGH (ref 11.6–15.2)

## 2011-02-20 NOTE — Telephone Encounter (Signed)
Spoke with pt dtr, aware dr Jens Som on vacation. She will ask the attending to consult [place] so we can check on pt [name]

## 2011-02-20 NOTE — Telephone Encounter (Signed)
Pt called she is in [facility] in Room 3701. Pt was told that she doesn't have a blood clot anymore. Now pt is wanting Dr. Jens Som to come into hospital to talk to pt about this b/c she is very worried. Please return call to discuss further.

## 2011-02-21 LAB — MAGNESIUM: Magnesium: 1.9 mg/dL (ref 1.5–2.5)

## 2011-02-21 LAB — BASIC METABOLIC PANEL
CO2: 27 mEq/L (ref 19–32)
Calcium: 9.9 mg/dL (ref 8.4–10.5)
Potassium: 4 mEq/L (ref 3.5–5.1)
Sodium: 144 mEq/L (ref 135–145)

## 2011-02-21 LAB — GLUCOSE, CAPILLARY

## 2011-02-21 LAB — PROTIME-INR: Prothrombin Time: 24.4 seconds — ABNORMAL HIGH (ref 11.6–15.2)

## 2011-02-25 NOTE — Consult Note (Signed)
NAMEAMERIA, [name]                 ACCOUNT NO.:  [id number]  MEDICAL RECORD NO.:  [id number]  LOCATION:  [facility]                         FACILITY:  [facility]  PHYSICIAN:  Joycelyn Schmid, MD   DATE OF BIRTH:  1925-01-23  DATE OF CONSULTATION:  02/19/2011 DATE OF DISCHARGE:                                CONSULTATION   CHIEF COMPLAINT:  Vision changes, lower extremity numbness.  HISTORY OF PRESENT ILLNESS:  An 77 year old female with history of atrial fibrillation, COPD, congestive heart failure, AV ablation status post pacemaker, diabetes, hypertension, hypercholesterolemia, von Willebrand disease, dementia, hypothyroidism, was at home when she began to have nausea, stomach swelling, blurred vision, and lower extremity numbness.  Upon arrival; blood glucose noted to be 350, blood pressure noted to be 95/79.  The patient had CT scan of the head which showed question of early ischemic changes.  We were asked to see the patient for further evaluation.  At the time of evaluation the patient complains blurred vision, which started earlier on October 24.  She also describes numbness in her lower extremities, symmetric from her knees to her feet for the past 2 months. She does have mild dementia.  She thinks the month is August.  She is not quite sure the reason why she is in the hospital.  She feels as though her daughter was more concerned about her symptoms than she was.  PAST MEDICAL HISTORY:  As per the HPI.  MEDICATIONS:  Potassium, Crestor, levothyroxine, sertraline, Xopenex, metoprolol, Coumadin.  ALLERGIES:  ASPIRIN causes stomach burning.  ATORVASTATIN, LABETALOL, SULFA CLARITHROMYCIN, also cause allergies.  FAMILY HISTORY:  Unknown.  SOCIAL HISTORY:  The patient states she lives with her daughter and her husband.  No tobacco, alcohol or illicit drug use.  REVIEW OF SYSTEMS:  As per HPI, otherwise full 14 system review is negative.  PHYSICAL EXAMINATION:  VITAL SIGNS:   Initial blood pressure 95/79, increased to 140/58, heart rate 74, respirations 29, O2 saturation 99% on room air. GENERAL:  She is awake and alert.  Language is fluent.  Cognition intact.  No extinction.  No aphasia.  She is oriented to August, 2012. She tells me that her age is 77 years old. HEENT:  Pupils reactive from 2 to 1 mm.  Visual fields full to confrontation.  Extraocular muscles intact.  No nystagmus.  Facial sensation strength symmetric.  Uvula is midline.  Shoulders symmetric. Tongue is midline. MOTOR EXAMINATION:  Normal bulk and tone.  5/5 strength in bilateral upper and lower extremities.  No drift.  No asymmetric decrease sensation in 1 side.  She does have decreased pinprick sensation of the bilateral lower extremities and the gradient up to her knees.  She has absent vibration sensation and proprioception at the toes.  Absent vibration up to her knees.  Reflexes, trace in the upper extremities, absent at the knees and ankles.  Downgoing toes.  Cardiovascular examination is regular rate and rhythm.  No murmurs.  No carotid bruits.  LABORATORY TESTING:  UA shows greater than 1000 glucose.  Sodium 134, glucose 417, BUN 14, creatinine 0.8.  White count 6.9, platelets 220. INR 1.65.  LFTs negative.  EKG shows  paced rhythm, AFib versus aflutter. CT scan shows no acute intracerebral hemorrhage.  She has moderate chronic small vessel ischemic disease.  Question of additional hypodensity in the right high parietal region, posterior fossa, notable for bone streak artifact.  ASSESSMENT AND PLAN:  An 77 year old female with constellation of symptoms including blurred vision, lower extremity numbness and nausea. Regarding lower extremity numbness it is most consistent with diabetic peripheral neuropathy.  Regarding her vision changes I cannot localize based on her neuro exam.  We are unable to an MRI because of pacemaker. Consider carotid ultrasound and transthoracic  echocardiogram although she is already being treated for atrial fibrillation and therefore as a reason for anticoagulation, transthoracic echocardiogram unlikely to change management from a stroke standpoint.  We would agree with medical management of hyperglycemia and subtherapeutic INR.     Joycelyn Schmid, MD     VP/MEDQ  D:  02/19/2011  T:  02/19/2011  Job:  [id number]  Electronically Signed by Joycelyn Schmid  on 02/25/2011 09:53:06 AM

## 2011-02-27 NOTE — Discharge Summary (Signed)
[name], [name] NO.:  [id number]  MEDICAL RECORD NO.:  [id number]  LOCATION:  3701                         FACILITY:  [facility]  PHYSICIAN:  Rosanna Randy, MDDATE OF BIRTH:  12/07/1924  DATE OF ADMISSION:  02/18/2011 DATE OF DISCHARGE:                              DISCHARGE SUMMARY   PRIMARY CARE PHYSICIAN:  Junious Dresser, MD  DISCHARGE DIAGNOSES: 1. Dizziness/weakness secondary to hyperglycemia. 2. Uncontrolled diabetes with a hemoglobin A1c of 12.6 3. Hypertension. 4. Hyperlipidemia. 5. Chronic compensated congestive heart failure with a mixed pattern     with an ejection fraction of 45-50% on 2-D echo done October 2012. 6. History of atrial fibrillation, status post pacemaker and also     radioablation on Coumadin. 7. Hypothyroidism. 8. Depression. 9. Gastroesophageal reflux disease. 10.Insomnia. 11.Hypokalemia. 12.Early dementia. 13.B12 deficiency. 14.Chronic obstructive pulmonary disease.  DISCHARGE MEDICATIONS: 1. Aspirin 81 mg 1 tablet by mouth daily. 2. Lantus 26 units subcutaneously twice a day. 3. Tylenol 325 mg to take 1-2 tablets by mouth every 6 h. as needed     for headache, pain and fever. 4. Humalog 10 units subcutaneously 3 times a day before meals. 5. Crestor 10 mg 1 tablet by mouth every morning. 6. Vitamin B12 injection intramuscularly twice monthly on the 1st and     the 15th of each month. 7. Diltiazem 120 mg extended release 1 tablet by mouth daily. 8. Lasix 1 tablet by mouth every morning 40 mg. 9. Guaifenesin extended release form 600 mg over-the-counter 1-2     tablets by mouth daily as needed for cough and congestion. 10.Metoprolol extended release form 50 mg half tablet by mouth daily. 11.Nitroglycerin 0.4 mg tablet sublingually 1 tablet under her tongue     every 5 minutes as needed up to 3 doses for chest pain. 12.Protonix 40 mg 1 tablet by mouth daily. 13.Potassium 10 mEq 1 tablet by mouth twice a  day. 14.Sertraline 50 mg 1 tablet by mouth daily. 15.Synthroid 1 tablet by mouth daily 200 mcg. 16.Warfarin 5 mg.  The patient instructed to take 1 tablet daily on     Tuesday, Thursday, Saturday and Sunday, and takes 1.5 tablets on     Monday, Wednesday and Friday. 17.Xopenex 45 mcg inhaler 2 puffs inhaled every 4 h. as needed for     wheezing.  Please add as part of the patient's discharge diagnosis of COPD.  DISPOSITION AND FOLLOWUP:  The patient had been discharged in a stable and improved condition.  Currently, not complaining of any dizziness, blurred vision or any acute complaints.  Arrangement has been made in order for the patient to have home health physical therapy, home health occupational therapy and also a home health nurse.  The patient had been instructed to take her medications as prescribed and she is going to follow with Dr. Lynnell Dike as an outpatient over the next 2 weeks for further adjustment of her medications and to review the treatment of her chronic medical problems.  The patient will also arrange followup as previously indicated prior coming into the hospital with her pulmonologist, Dr. Jetty Duhamel and also her cardiologist, Dr. Jens Som.  The patient  was instructed to follow a low-carbohydrate diet, heart healthy, low-fat diet.  HISTORY OF PRESENT ILLNESS:  For full details, please refer to dictation done by Dr. Talmage Nap on February 19, 2011, but briefly this is a 77 year old Caucasian female with a history of coronary artery disease, status post CABG, atrial fibrillation on Coumadin, status post catheter ablation and status post pacemaker who came into the hospital secondary to blurred vision, dizzy spell and difficulty walking secondary to weakness with unspecified duration.  Of note, the patient was found to be hyperglycemic with a blood sugar in the 400 range and due to the history of atrial fibrillation on Coumadin that  was subtherapeutic.  She was admitted to rule out any posterior circulation CVA.  PERTINENT LABORATORY DATA THROUGHOUT THIS HOSPITALIZATION:  Cardiac markers negative x3.  A CBC with differential on admission does show a white blood cell 6.9, hemoglobin 11.3 and platelets 220.  PT on admission 19.8 with an INR of 1.6, PTT 40.  A comprehensive metabolic panel showed a sodium of 134, potassium 3.4, chloride 95, bicarb 28, blood sugar 417, BUN 14, creatinine 0.87.  LFTs were completely normal. Lipase was 53.  Urinalysis and urine microscopy negative for leukocyte esterase, negative for nitrite, 0-2 white blood cells, no growth in the urine culture, magnesium was 1.6.  The patient's BNP was 1091.  Lipid profile demonstrated a total cholesterol of 114, triglycerides 118, HDL 46, LDL 44, TSH 1.654.  Hemoglobin A1c 12.6 with a mean plasma glucose for this level of 315.  At discharge, basic metabolic panel showed a sodium of 144, potassium 4.0, chloride 107, bicarb 27, glucose 137, BUN 11, creatinine 0.80, calcium 9.9 and magnesium 1.9.  HOSPITAL COURSE BY PROBLEM: 1. Blurred vision, dizziness weakness almost likely associated to     hyperglycemia and uncontrolled diabetes.  The patient has MRI, MRA,     CT of the head done all of them failing to demonstrate any acute     stroke.  At this point, plan is to have her blood sugar under     better control and to arrange for physical therapy, occupational     therapy as an outpatient in order to continue helping with a     physical rehab process.  She is going to follow with primary care     physician as an outpatient. 2. Uncontrolled diabetes with A1c of 12.6.  Her Lantus has been     adjusted and she will be using this before meals, sliding scale 10     units 3 times a day.  Further adjustment to be done by primary care     physician as an outpatient, the patient instructed to follow a low-     carbohydrate diet. 3. Hypertension, stable.  We  will continue current medication regimen.     The patient instructed to follow a low-sodium, heart healthy diet. 4. Hyperlipidemia.  We are going to continue statins, lipid profile     demonstrating pretty good control. 5. Chronic compensated congestive heart failure rate with a mixed     pattern (diastolic and systolic with an ejection fraction of 45-     50%).  At this point, no JVD.  No crackles, no fluid overload     status.  We will continue current regimen. 6. History of atrial fibrillation.  The patient continued to be rate     control and currently in sinus.  We are going to continue using her  beta blocker, and the patient will continue Coumadin.  She will     follow with Dr. Jens Som as previously indicated for further     evaluation and treatment of her atrial fibrillation history. 7. Hypothyroidism.  TSH normal.  We are going to continue using     Synthroid at the same dose. 8. Depression.  We will continue sertraline. 9. GERD.  We are going to continue her PPI. 10.Hypokalemia that was repleted with a normal magnesium level.  Her     low potassium most likely secondary to the use of diuretics.  The     patient now on maintenance potassium daily doses. 11.B12 deficiency.  She will continue her B12 shots as an outpatient. 12.Insomnia.  The patient is going to follow with primary care     physician as an outpatient in order to start medications as needed     to help her fall asleep.  At discharge, temperature 98.3, heart     rate 74, respiratory rate 16, blood pressure 112/68, oxygen     saturation 98% on 2 L.  In general, no acute distress.  Respiratory     system was clear to auscultation.  Heart, regular rate, S1 and S2.     No rubs or gallops.  Abdomen is soft, nontender and positive bowel     sounds.  Extremities without edema, cyanosis, or clubbing.     Neurologic exam without any focal deficits.  The rest of the     patient's medical problems remained stable and no  changes has been     made to her medication regimen.  PROCEDURE PERFORMED DURING THIS HOSPITALIZATION:  The patient had a chest x-ray, 2-views that demonstrate a vascular congestion, mild cardiomegaly without significant pulmonary edema; there maybe some degree of chronic underlying vascular congestion.  There were some findings of emphysematous changes.  There is a dense calcification along the proximal abdominal aorta suggesting clinical correlation for symptoms of claudication.  CT of the abdomen without contrast demonstrated a vague focus of decreased attenuation within the high right parietal lobe, extending to the age of the white matter; this could reflect a small acute infarct. There is no evidence of hemorrhagic transformation.  There was also hypoattenuation within the right cerebellar hemisphere and pons maybe artifactual in nature, as a barely more prominent than on prior studies. However, an evolving acute infarct cannot be entirely excluded. Clinical correlation for cerebellar symptoms will be necessary.  Further imaging maybe considered as deemed clinically appropriate.  There is no evidence of hemorrhagic transformation.  There was a diffuse small- vessel ischemic microangiopathy.  A CT angio of the neck demonstrated no CT evidence of large acute infarct.  There is a prominent small-vessel disease type changes.  No intracranial mass or abdominal enhancement. Calcified internal carotid artery, segment with mild narrowing bilaterally.  There is a mild to slightly moderate narrowing at the cavernous segment/supraclinoid segment junction bilaterally.  Left vertebral artery is dominant.  Plaque with mild narrowing of both vertebral arteries.  Right vertebral artery is caliber after the takeoff of the right PICA.  A CT angio of the head was turned together with the CT angio of the neck and the report is the same.  Consultations made during this hospitalization.  Neurology  group was consulted.     Rosanna Randy, MD     CEM/MEDQ  D:  02/21/2011  T:  02/21/2011  Job:  [id number]  Electronically Signed by Vassie Loll MD on  02/27/2011 10:38:57 PM

## 2011-03-04 ENCOUNTER — Encounter: Payer: Medicare Other | Admitting: *Deleted

## 2011-03-26 ENCOUNTER — Ambulatory Visit (INDEPENDENT_AMBULATORY_CARE_PROVIDER_SITE_OTHER): Payer: Medicare Other | Admitting: *Deleted

## 2011-03-26 DIAGNOSIS — I4891 Unspecified atrial fibrillation: Secondary | ICD-10-CM

## 2011-03-26 DIAGNOSIS — Z950 Presence of cardiac pacemaker: Secondary | ICD-10-CM

## 2011-03-27 ENCOUNTER — Encounter: Payer: Self-pay | Admitting: Internal Medicine

## 2011-03-27 ENCOUNTER — Other Ambulatory Visit: Payer: Self-pay | Admitting: Internal Medicine

## 2011-03-27 LAB — REMOTE PACEMAKER DEVICE
BRDY-0002RV: 70 {beats}/min
RV LEAD AMPLITUDE: 12 mv
RV LEAD IMPEDENCE PM: 550 Ohm

## 2011-03-30 ENCOUNTER — Observation Stay (HOSPITAL_COMMUNITY)
Admission: EM | Admit: 2011-03-30 | Discharge: 2011-04-03 | Disposition: A | Discharge status: Home / Self Care | Payer: Medicare Other | Attending: Internal Medicine | Admitting: Internal Medicine

## 2011-03-30 ENCOUNTER — Encounter (HOSPITAL_COMMUNITY): Payer: Self-pay | Admitting: *Deleted

## 2011-03-30 ENCOUNTER — Other Ambulatory Visit: Payer: Self-pay

## 2011-03-30 DIAGNOSIS — E039 Hypothyroidism, unspecified: Secondary | ICD-10-CM | POA: Insufficient documentation

## 2011-03-30 DIAGNOSIS — J40 Bronchitis, not specified as acute or chronic: Secondary | ICD-10-CM | POA: Insufficient documentation

## 2011-03-30 DIAGNOSIS — R110 Nausea: Secondary | ICD-10-CM | POA: Diagnosis present

## 2011-03-30 DIAGNOSIS — M549 Dorsalgia, unspecified: Secondary | ICD-10-CM | POA: Insufficient documentation

## 2011-03-30 DIAGNOSIS — I2510 Atherosclerotic heart disease of native coronary artery without angina pectoris: Secondary | ICD-10-CM | POA: Insufficient documentation

## 2011-03-30 DIAGNOSIS — Z7901 Long term (current) use of anticoagulants: Secondary | ICD-10-CM | POA: Insufficient documentation

## 2011-03-30 DIAGNOSIS — Z950 Presence of cardiac pacemaker: Secondary | ICD-10-CM | POA: Insufficient documentation

## 2011-03-30 DIAGNOSIS — N390 Urinary tract infection, site not specified: Secondary | ICD-10-CM | POA: Insufficient documentation

## 2011-03-30 DIAGNOSIS — R531 Weakness: Secondary | ICD-10-CM

## 2011-03-30 DIAGNOSIS — IMO0001 Reserved for inherently not codable concepts without codable children: Secondary | ICD-10-CM | POA: Insufficient documentation

## 2011-03-30 DIAGNOSIS — E1165 Type 2 diabetes mellitus with hyperglycemia: Secondary | ICD-10-CM | POA: Diagnosis present

## 2011-03-30 DIAGNOSIS — D6800 Von Willebrand disease, unspecified: Secondary | ICD-10-CM | POA: Insufficient documentation

## 2011-03-30 DIAGNOSIS — I4891 Unspecified atrial fibrillation: Secondary | ICD-10-CM | POA: Diagnosis present

## 2011-03-30 DIAGNOSIS — E871 Hypo-osmolality and hyponatremia: Secondary | ICD-10-CM | POA: Insufficient documentation

## 2011-03-30 DIAGNOSIS — D649 Anemia, unspecified: Secondary | ICD-10-CM | POA: Insufficient documentation

## 2011-03-30 DIAGNOSIS — J4489 Other specified chronic obstructive pulmonary disease: Secondary | ICD-10-CM | POA: Insufficient documentation

## 2011-03-30 DIAGNOSIS — R262 Difficulty in walking, not elsewhere classified: Secondary | ICD-10-CM | POA: Insufficient documentation

## 2011-03-30 DIAGNOSIS — Z23 Encounter for immunization: Secondary | ICD-10-CM | POA: Insufficient documentation

## 2011-03-30 DIAGNOSIS — J449 Chronic obstructive pulmonary disease, unspecified: Secondary | ICD-10-CM | POA: Insufficient documentation

## 2011-03-30 DIAGNOSIS — R0602 Shortness of breath: Secondary | ICD-10-CM | POA: Insufficient documentation

## 2011-03-30 DIAGNOSIS — R5381 Other malaise: Principal | ICD-10-CM | POA: Insufficient documentation

## 2011-03-30 DIAGNOSIS — Z951 Presence of aortocoronary bypass graft: Secondary | ICD-10-CM | POA: Insufficient documentation

## 2011-03-30 MED ORDER — ONDANSETRON HCL 4 MG/2ML IJ SOLN
4.0000 mg | Freq: Once | INTRAMUSCULAR | Status: AC
  Administered 2011-03-31: 4 mg via INTRAVENOUS

## 2011-03-30 NOTE — ED Notes (Signed)
ED doctor at the bedside.

## 2011-03-30 NOTE — ED Notes (Signed)
Pt c/o 2 things.  Nausea when she eats and sob.  Nausea began 5 days prior.  Denies emesis, nausea or diarrhea. States sob x 1 year, but for the past week, when it's hot, she experiences sob, when it's cool, sob is relieved.  Has been using her o2, but per ems, the o2 was off even though the [place] was on her nose.

## 2011-03-31 ENCOUNTER — Emergency Department (HOSPITAL_COMMUNITY): Payer: Medicare Other

## 2011-03-31 ENCOUNTER — Encounter (HOSPITAL_COMMUNITY): Payer: Self-pay | Admitting: *Deleted

## 2011-03-31 DIAGNOSIS — R531 Weakness: Secondary | ICD-10-CM

## 2011-03-31 LAB — DIFFERENTIAL
Basophils Absolute: 0 10*3/uL (ref 0.0–0.1)
Basophils Relative: 0 % (ref 0–1)
Eosinophils Relative: 1 % (ref 0–5)
Monocytes Absolute: 0.5 10*3/uL (ref 0.1–1.0)

## 2011-03-31 LAB — CBC
HCT: 38.6 % (ref 36.0–46.0)
HCT: 36.4 % (ref 36.0–46.0)
MCH: 23.2 pg — ABNORMAL LOW (ref 26.0–34.0)
MCHC: 32.4 g/dL (ref 30.0–36.0)
MCV: 73.8 fL — ABNORMAL LOW (ref 78.0–100.0)
MCV: 75.5 fL — ABNORMAL LOW (ref 78.0–100.0)
Platelets: 197 10*3/uL (ref 150–400)
RDW: 16.6 % — ABNORMAL HIGH (ref 11.5–15.5)
RDW: 16.9 % — ABNORMAL HIGH (ref 11.5–15.5)

## 2011-03-31 LAB — COMPREHENSIVE METABOLIC PANEL
ALT: 15 U/L (ref 0–35)
ALT: 13 U/L (ref 0–35)
Albumin: 3.7 g/dL (ref 3.5–5.2)
Alkaline Phosphatase: 103 U/L (ref 39–117)
Alkaline Phosphatase: 94 U/L (ref 39–117)
BUN: 18 mg/dL (ref 6–23)
BUN: 17 mg/dL (ref 6–23)
CO2: 25 mEq/L (ref 19–32)
CO2: 30 mEq/L (ref 19–32)
Calcium: 9.6 mg/dL (ref 8.4–10.5)
Calcium: 8.8 mg/dL (ref 8.4–10.5)
Chloride: 93 mEq/L — ABNORMAL LOW (ref 96–112)
Creatinine, Ser: 0.94 mg/dL (ref 0.50–1.10)
GFR calc Af Amer: 63 mL/min — ABNORMAL LOW (ref >90)
GFR calc Af Amer: 60 mL/min — ABNORMAL LOW (ref >90)
GFR calc non Af Amer: 52 mL/min — ABNORMAL LOW (ref >90)
Glucose, Bld: 391 mg/dL — ABNORMAL HIGH (ref 70–99)
Potassium: 4.5 mEq/L (ref 3.5–5.1)
Sodium: 132 mEq/L — ABNORMAL LOW (ref 135–145)
Total Protein: 6.8 g/dL (ref 6.0–8.3)

## 2011-03-31 LAB — URINALYSIS, ROUTINE W REFLEX MICROSCOPIC
Ketones, ur: 15 mg/dL — AB
Nitrite: NEGATIVE
pH: 5 (ref 5.0–8.0)

## 2011-03-31 LAB — GLUCOSE, CAPILLARY
Glucose-Capillary: 460 mg/dL — ABNORMAL HIGH (ref 70–99)
Glucose-Capillary: 491 mg/dL — ABNORMAL HIGH (ref 70–99)

## 2011-03-31 LAB — PROTIME-INR: INR: 1.07 (ref 0.00–1.49)

## 2011-03-31 LAB — URINE MICROSCOPIC-ADD ON

## 2011-03-31 LAB — HEMOGLOBIN A1C: Mean Plasma Glucose: 355 mg/dL — ABNORMAL HIGH (ref <117)

## 2011-03-31 MED ORDER — BUDESONIDE-FORMOTEROL FUMARATE 160-4.5 MCG/ACT IN AERO
1.0000 | INHALATION_SPRAY | Freq: Two times a day (BID) | RESPIRATORY_TRACT | Status: DC
  Administered 2011-03-31 – 2011-04-03 (×7): 1 via RESPIRATORY_TRACT
  Filled 2011-03-31: qty 6

## 2011-03-31 MED ORDER — INSULIN GLARGINE 100 UNIT/ML ~~LOC~~ SOLN
32.0000 [IU] | Freq: Every day | SUBCUTANEOUS | Status: DC
  Administered 2011-03-31 – 2011-04-01 (×2): 32 [IU] via SUBCUTANEOUS
  Filled 2011-03-31: qty 3

## 2011-03-31 MED ORDER — ACETAMINOPHEN 650 MG RE SUPP: 650.0000 mg | Freq: Four times a day (QID) | RECTAL | Status: DC | PRN

## 2011-03-31 MED ORDER — SERTRALINE HCL 100 MG PO TABS
100.0000 mg | ORAL_TABLET | Freq: Every day | ORAL | Status: DC
  Administered 2011-03-31 – 2011-04-03 (×4): 100 mg via ORAL
  Filled 2011-03-31 (×4): qty 1

## 2011-03-31 MED ORDER — ACETAMINOPHEN 325 MG PO TABS: 650.0000 mg | ORAL_TABLET | Freq: Four times a day (QID) | ORAL | Status: DC | PRN

## 2011-03-31 MED ORDER — NITROGLYCERIN 0.4 MG SL SUBL: 0.4000 mg | SUBLINGUAL_TABLET | SUBLINGUAL | Status: DC | PRN

## 2011-03-31 MED ORDER — CYANOCOBALAMIN 1000 MCG/ML IJ SOLN
1000.0000 ug | INTRAMUSCULAR | Status: DC
  Administered 2011-03-31: 1000 ug via INTRAMUSCULAR
  Filled 2011-03-31: qty 1

## 2011-03-31 MED ORDER — LEVALBUTEROL TARTRATE 45 MCG/ACT IN AERO
1.0000 | INHALATION_SPRAY | Freq: Four times a day (QID) | RESPIRATORY_TRACT | Status: DC
  Administered 2011-03-31 (×2): 2 via RESPIRATORY_TRACT
  Filled 2011-03-31: qty 15

## 2011-03-31 MED ORDER — POTASSIUM CHLORIDE 20 MEQ/15ML (10%) PO LIQD
40.0000 meq | Freq: Once | ORAL | Status: AC
  Administered 2011-03-31: 40 meq via ORAL
  Filled 2011-03-31: qty 30

## 2011-03-31 MED ORDER — INSULIN ASPART 100 UNIT/ML ~~LOC~~ SOLN: 15.0000 [IU] | Freq: Once | SUBCUTANEOUS | Status: DC

## 2011-03-31 MED ORDER — ONDANSETRON HCL 4 MG/2ML IJ SOLN: 4.0000 mg | Freq: Four times a day (QID) | INTRAMUSCULAR | Status: DC | PRN

## 2011-03-31 MED ORDER — INSULIN ASPART 100 UNIT/ML ~~LOC~~ SOLN
15.0000 [IU] | Freq: Once | SUBCUTANEOUS | Status: AC
  Administered 2011-03-31: 15 [IU] via SUBCUTANEOUS

## 2011-03-31 MED ORDER — INSULIN GLARGINE 100 UNIT/ML ~~LOC~~ SOLN
10.0000 [IU] | Freq: Every day | SUBCUTANEOUS | Status: DC
  Administered 2011-03-31: 10 [IU] via SUBCUTANEOUS
  Filled 2011-03-31: qty 3

## 2011-03-31 MED ORDER — DOXYCYCLINE HYCLATE 100 MG PO TABS
100.0000 mg | ORAL_TABLET | Freq: Two times a day (BID) | ORAL | Status: DC
  Administered 2011-03-31 – 2011-04-03 (×7): 100 mg via ORAL
  Filled 2011-03-31 (×9): qty 1

## 2011-03-31 MED ORDER — WARFARIN SODIUM 7.5 MG PO TABS
7.5000 mg | ORAL_TABLET | Freq: Once | ORAL | Status: AC
  Administered 2011-03-31: 7.5 mg via ORAL
  Filled 2011-03-31: qty 1

## 2011-03-31 MED ORDER — INSULIN ASPART 100 UNIT/ML ~~LOC~~ SOLN
10.0000 [IU] | Freq: Once | SUBCUTANEOUS | Status: AC
  Administered 2011-03-31: 10 [IU] via SUBCUTANEOUS

## 2011-03-31 MED ORDER — ONDANSETRON HCL 4 MG PO TABS
4.0000 mg | ORAL_TABLET | Freq: Four times a day (QID) | ORAL | Status: DC | PRN
  Administered 2011-03-31: 4 mg via ORAL
  Filled 2011-03-31: qty 1

## 2011-03-31 MED ORDER — PANTOPRAZOLE SODIUM 40 MG PO TBEC
40.0000 mg | DELAYED_RELEASE_TABLET | Freq: Every day | ORAL | Status: DC
  Administered 2011-03-31 – 2011-04-03 (×4): 40 mg via ORAL
  Filled 2011-03-31 (×4): qty 1

## 2011-03-31 MED ORDER — METOPROLOL SUCCINATE ER 50 MG PO TB24
50.0000 mg | ORAL_TABLET | Freq: Every day | ORAL | Status: DC
  Administered 2011-03-31 – 2011-04-03 (×4): 50 mg via ORAL
  Filled 2011-03-31 (×4): qty 1

## 2011-03-31 MED ORDER — SODIUM CHLORIDE 0.9 % IV SOLN
INTRAVENOUS | Status: DC
  Administered 2011-04-01: 75 mL/h via INTRAVENOUS
  Administered 2011-04-01: 16:00:00 via INTRAVENOUS

## 2011-03-31 MED ORDER — INFLUENZA VIRUS VACC SPLIT PF IM SUSP
0.5000 mL | INTRAMUSCULAR | Status: AC
  Administered 2011-04-01: 0.5 mL via INTRAMUSCULAR
  Filled 2011-03-31: qty 0.5

## 2011-03-31 MED ORDER — POTASSIUM CHLORIDE CRYS ER 10 MEQ PO TBCR
10.0000 meq | EXTENDED_RELEASE_TABLET | Freq: Every day | ORAL | Status: DC
  Administered 2011-03-31 – 2011-04-03 (×4): 10 meq via ORAL
  Filled 2011-03-31 (×4): qty 1

## 2011-03-31 MED ORDER — INSULIN ASPART 100 UNIT/ML ~~LOC~~ SOLN
0.0000 [IU] | Freq: Three times a day (TID) | SUBCUTANEOUS | Status: DC
  Administered 2011-03-31: 9 [IU] via SUBCUTANEOUS
  Administered 2011-04-01: 2 [IU] via SUBCUTANEOUS
  Administered 2011-04-01: 7 [IU] via SUBCUTANEOUS
  Administered 2011-04-01: 3 [IU] via SUBCUTANEOUS
  Filled 2011-03-31: qty 3

## 2011-03-31 MED ORDER — ALBUTEROL SULFATE (5 MG/ML) 0.5% IN NEBU
2.5000 mg | INHALATION_SOLUTION | Freq: Four times a day (QID) | RESPIRATORY_TRACT | Status: DC
  Administered 2011-03-31 – 2011-04-01 (×4): 2.5 mg via RESPIRATORY_TRACT
  Filled 2011-03-31 (×4): qty 0.5

## 2011-03-31 MED ORDER — SODIUM CHLORIDE 0.9 % IV SOLN
INTRAVENOUS | Status: DC
  Administered 2011-03-31: via INTRAVENOUS

## 2011-03-31 NOTE — Progress Notes (Signed)
Remote pacer check  

## 2011-03-31 NOTE — Progress Notes (Signed)
PCP: [name], MD  Brief narrative: 77 year-old female history of CAD status post CABG, history of atrial fibrillation status post AV ablation status post pacemaker placement, COPD, diabetes mellitus type 2, von Willebrand's disease came to the ER because of increasing weakness over the last week. Patient usually uses a walker to walk around. But she states that now is feeling profoundly weak that she is unable to walk even with the help of walker. In addition patient is having nausea but denies any vomiting or abdominal pain. In the ER patient had labs which are normal except for possible UTI. Patient needed help in the ER to be mobilized. Patient was admitted for similar complaints last month. And at that time stroke was ruled out.   Past medical history:  CAD status post CABG, history of atrial fibrillation status post AV ablation status post pacemaker placement, COPD, diabetes mellitus type 2, von Willebrand's disease  Consultants: None so far  Procedures: None so far   Subjective: Patient continues to complain of nausea but no emesis. Also complaining of cough with clear expectoration. Weakness persists.  Objective: Vital signs in last 24 hours: Temp:  [98.2 F (36.8 C)] 98.2 F (36.8 C) (12/04 0530) Pulse Rate:  [65-75] 74  (12/04 0530) Resp:  [18-24] 18  (12/04 0530) BP: (106-156)/(43-89) 151/78 mmHg (12/04 0530) SpO2:  [94 %-100 %] 95 % (12/04 1221) Weight:  [89.812 kg (198 lb)-90.4 kg (199 lb 4.7 oz)] 199 lb 4.7 oz (90.4 kg) (12/04 0530) Weight change:  Last BM Date: 03/30/11  Intake/Output from previous day:   Intake/Output this shift:    General appearance: alert, cooperative, appears stated age and no distress Head: Normocephalic, without obvious abnormality, atraumatic Throat: lips, mucosa, and tongue normal; teeth and gums normal Neck: no adenopathy, no carotid bruit, no JVD, supple, symmetrical, trachea midline and thyroid not enlarged, symmetric, no  tenderness/mass/nodules Resp: wheezes bilaterally Cardio: regular rate and rhythm, S1, S2 normal, no murmur, click, rub or gallop GI: Soft, mildly tender in right upper quadrant. No rebound. No murphy's. BS present. Extremities: extremities normal, atraumatic, no cyanosis or edema Pulses: 2+ and symmetric Skin: Skin color, texture, turgor normal. No rashes or lesions Lymph nodes: Cervical, supraclavicular, and axillary nodes normal. Neurologic: Grossly normal  Lab Results:  [facility] 03/31/11 0655 03/30/11 2317  WBC 6.2 8.0  HGB 11.2* 12.5  HCT 36.4 38.6  PLT 197 241   BMET  Basename 03/31/11 0655 03/30/11 2317  NA 132* 133*  K 4.5 3.9  CL 93* 93*  CO2 30 25  GLUCOSE 391* 294*  BUN 17 18  CREATININE 0.97 0.94  CALCIUM 8.8 9.6    Studies/Results: Dg Chest 2vsame Day  03/31/2011  *RADIOLOGY REPORT*  Clinical Data: Shortness of breath and productive cough.  CHEST - 2 VIEW SAME DAY  Comparison: Chest radiograph performed 02/18/2011  Findings: The lungs are well-aerated.  Mild right basilar opacity likely reflects chronic scarring, on correlation with multiple prior studies.  There is no evidence of pleural effusion or pneumothorax.  The heart is mildly enlarged; the patient is status with mid sternotomy, with evidence of prior CABG.  A pacemaker is noted at the left chest wall, with two leads ending along the right ventricle.  Calcification is noted along the thoracic aorta.  No acute osseous abnormalities are seen.  IMPRESSION:  1.  Mild right basilar airspace opacity likely reflects chronic scarring, on correlation with multiple prior studies.  No definite acute airspace consolidation seen. 2.  Mild cardiomegaly noted. 3.  Calcification along the thoracic aorta.  Original Report Authenticated By: Tonia Ghent, M.D.    Medications:  Scheduled:   . budesonide-formoterol  1 puff Inhalation BID  . cyanocobalamin  1,000 mcg Intramuscular Q30 days  . doxycycline  100 mg Oral Q12H  .  influenza  inactive virus vaccine  0.5 mL Intramuscular Tomorrow-1000  . insulin aspart  0-9 Units Subcutaneous TID WC  . insulin aspart  15 Units Subcutaneous Once  . insulin glargine  32 Units Subcutaneous Daily  . levalbuterol  1-2 puff Inhalation QID  . metoprolol  50 mg Oral Daily  . ondansetron (ZOFRAN) IV  4 mg Intravenous Once  . pantoprazole  40 mg Oral Daily  . potassium chloride  10 mEq Oral Daily  . potassium chloride  40 mEq Oral Once  . sertraline  100 mg Oral Daily  . warfarin  7.5 mg Oral ONCE-1800    Principal Problem:  *Weakness generalized Active Problems:  HYPOTHYROIDISM  DM  CORONARY ARTERY DISEASE  PAROXYSMAL ATRIAL FIBRILLATION   Assessment/Plan: #1 generalized weakness: Etiology remains unclear. She does not have any focal neurological deficits. I believe this may be due to a combination of her bronchitis, UTI, Hyperglycemia. Physical therapy will be involved.  #2 urinary tract infection: Continue with doxycycline. Patient has multiple antibiotic allergies.  #3 nausea: We'll proceed with ultrasound of her abdomen to evaluate her gallbladder. Treat symptomatically for now.   #4 mild bronchitis: We will give her nebulizer treatments. Hold off on steroids for now.  #5 poorly controlled diabetes, type II: HbA1c is 14. We will put her on night dose of Lantus, as well.  #6 mild hyponatremia and mild anemia. Will be monitored closely.  Rest of her medical issues including hypothyroidism, coronary artery disease , and paroxysmal A. fib, are stable. Warfarin is being dosed by pharmacy.  DVT, prophylaxis with SCDs.    LOS: 1 day   Dushawn Pusey 03/31/2011, 5:04 PM

## 2011-03-31 NOTE — ED Notes (Signed)
Pt. To Rad. Via stretcher, NAD noted

## 2011-03-31 NOTE — Progress Notes (Signed)
Inpatient Diabetes Program Recommendations  AACE/ADA: New Consensus Statement on Inpatient Glycemic Control (2009)  Target Ranges:  Prepandial:   less than 140 mg/dL      Peak postprandial:   less than 180 mg/dL (1-2 hours)      Critically ill patients:  140 - 180 mg/dL   Reason for Visit: Elevated glucose 369 mg/dL  Inpatient Diabetes Program Recommendations Insulin - Meal Coverage: Add meal coverage:  Patient takes 10-12 units with each meal.

## 2011-03-31 NOTE — H&P (Signed)
[name] is an 77 y.o. female.   Chief Complaint: Weakness. HPI: 77 year-old female history of CAD status post CABG, history of atrial fibrillation status post AV ablation status post pacemaker placement, COPD, diabetes mellitus type 2, von Willebrand's disease came to the ER because of increasing weakness over the last week. Patient usually uses a walker to walk around. But she states that now is feeling profoundly weak that she is unable to walk even with the help of walker. In addition patient is having nausea but denies any vomiting or abdominal pain. In the ER patient had labs which are normal except for possible UTI. Patient needed help in the ER to be mobilized. Patient was admitted for similar complaints last month. And at that time stroke was ruled out.  Past Medical History  Diagnosis Date  . CHF (congestive heart failure)   . Cardiac pacemaker in situ   . Anemia, pernicious   . Atrial fibrillation     S/P AV node ablation  . Von Willebrand's disease   . Gallbladder disease   . Hypothyroidism   . Diabetes mellitus   . CAD (coronary artery disease)   . Acute asthmatic bronchitis   . COPD (chronic obstructive pulmonary disease)   . Shortness of breath     Past Surgical History  Procedure Date  . Coronary artery bypass graft   . Mandibular reconstruction w/ iliac bone graft   . Appendectomy   . Hernia repair     History reviewed. No pertinent family history. Social History:  reports that she quit smoking about 22 years ago. She does not have any smokeless tobacco history on file. She reports that she does not drink alcohol. Her drug history not on file.  Allergies:  Allergies  Allergen Reactions  . Carbamazepine   . Cefaclor     REACTION: unspecified  . Cephalexin   . Clarithromycin   . Dicyclomine   . Dicyclomine Hcl     REACTION: unspecified  . ZOX:[id number]+XBJYNWGNF+AOZHYQMVHQ Acid+Aspartame   . Sulfamethoxazole     REACTION: unspecified  . Sulfonamide  Derivatives     Medications Prior to Admission  Medication Dose Route Frequency Provider Last Rate Last Dose  . 0.9 %  sodium chloride infusion   Intravenous Continuous Eduard Clos      . acetaminophen (TYLENOL) tablet 650 mg  650 mg Oral Q6H PRN Eduard Clos       Or  . acetaminophen (TYLENOL) suppository 650 mg  650 mg Rectal Q6H PRN Eduard Clos      . budesonide-formoterol (SYMBICORT) 160-4.5 MCG/ACT inhaler 1 puff  1 puff Inhalation BID Eduard Clos      . cyanocobalamin ((VITAMIN B-12)) injection 1,000 mcg  1,000 mcg Intramuscular Q30 days Eduard Clos      . influenza  inactive virus vaccine (FLUZONE/FLUARIX) injection 0.5 mL  0.5 mL Intramuscular Tomorrow-1000 Pandora Leiter      . insulin aspart (novoLOG) injection 0-9 Units  0-9 Units Subcutaneous TID WC Eduard Clos      . insulin glargine (LANTUS) injection 32 Units  32 Units Subcutaneous Q0700 Eduard Clos      . levalbuterol (XOPENEX HFA) inhaler 1-2 puff  1-2 puff Inhalation QID Eduard Clos      . metoprolol (TOPROL-XL) 24 hr tablet 50 mg  50 mg Oral Daily Eduard Clos      . nitroGLYCERIN (NITROSTAT) SL tablet 0.4 mg  0.4 mg Sublingual Q5  min PRN Eduard Clos      . ondansetron (ZOFRAN) injection 4 mg  4 mg Intravenous Once Olivia Mackie, MD   4 mg at 03/31/11 0004  . ondansetron (ZOFRAN) tablet 4 mg  4 mg Oral Q6H PRN Eduard Clos       Or  . ondansetron (ZOFRAN) injection 4 mg  4 mg Intravenous Q6H PRN Eduard Clos      . pantoprazole (PROTONIX) EC tablet 40 mg  40 mg Oral Daily Eduard Clos      . potassium chloride (K-DUR,KLOR-CON) CR tablet 10 mEq  10 mEq Oral Daily Eduard Clos      . potassium chloride 20 MEQ/15ML (10%) liquid 40 mEq  40 mEq Oral Once Olivia Mackie, MD   40 mEq at 03/31/11 0109  . sertraline (ZOLOFT) tablet 100 mg  100 mg Oral Daily Eduard Clos      . DISCONTD: 0.9 %  sodium  chloride infusion   Intravenous Continuous Olivia Mackie, MD 20 mL/hr at 03/31/11 0011     Medications Prior to Admission  Medication Sig Dispense Refill  . budesonide-formoterol (SYMBICORT) 160-4.5 MCG/ACT inhaler Inhale 1 puff into the lungs 2 (two) times daily.        . cyanocobalamin (COBAL-1000) 1000 MCG/ML injection Inject 1,000 mcg into the muscle. Take twice a month.       . furosemide (LASIX) 40 MG tablet Take 40 mg by mouth daily.        . insulin glargine (LANTUS) 100 UNIT/ML injection Inject 32 Units into the skin every morning.        . insulin lispro (HUMALOG KWIKPEN) 100 UNIT/ML injection Inject 10-12 Units into the skin 3 (three) times daily as needed. Give 10 units three times a day if blood sugar less than 225. Give 12 units three times a day if blood sugar 225 or higher.      . levalbuterol (XOPENEX HFA) 45 MCG/ACT inhaler Inhale 1-2 puffs into the lungs 4 (four) times daily.        . metoprolol (TOPROL-XL) 100 MG 24 hr tablet Take 1/2 tablet daily  30 tablet  6  . nitroGLYCERIN (NITROSTAT) 0.4 MG SL tablet Place 0.4 mg under the tongue every 5 (five) minutes as needed. For chest pain.      . pantoprazole (PROTONIX) 40 MG tablet Take 40 mg by mouth daily.        . potassium chloride (K-DUR,KLOR-CON) 10 MEQ tablet Take 10 mEq by mouth daily.        . sertraline (ZOLOFT) 100 MG tablet Take 100 mg by mouth daily.        Marland Kitchen warfarin (COUMADIN) 5 MG tablet Take 5 mg by mouth daily. Takes 0.5 on Thursdays.        Results for orders placed during the hospital encounter of 03/30/11 (from the past 48 hour(s))  PRO B NATRIURETIC PEPTIDE     Status: Abnormal   Collection Time   03/30/11 11:16 PM      Component Value Range Comment   BNP, POC 588.3 (*) 0 - 450 (pg/mL)   CBC     Status: Abnormal   Collection Time   03/30/11 11:17 PM      Component Value Range Comment   WBC 8.0  4.0 - 10.5 (K/uL)    RBC 5.23 (*) 3.87 - 5.11 (MIL/uL)    Hemoglobin 12.5  12.0 - 15.0 (g/dL)    HCT 38.6  36.0 - 46.0 (%)    MCV 73.8 (*) 78.0 - 100.0 (fL)    MCH 23.9 (*) 26.0 - 34.0 (pg)    MCHC 32.4  30.0 - 36.0 (g/dL)    RDW 40.9 (*) 81.1 - 15.5 (%)    Platelets 241  150 - 400 (K/uL)   DIFFERENTIAL     Status: Normal   Collection Time   03/30/11 11:17 PM      Component Value Range Comment   Neutrophils Relative 73  43 - 77 (%)    Neutro Abs 5.9  1.7 - 7.7 (K/uL)    Lymphocytes Relative 19  12 - 46 (%)    Lymphs Abs 1.5  0.7 - 4.0 (K/uL)    Monocytes Relative 6  3 - 12 (%)    Monocytes Absolute 0.5  0.1 - 1.0 (K/uL)    Eosinophils Relative 1  0 - 5 (%)    Eosinophils Absolute 0.1  0.0 - 0.7 (K/uL)    Basophils Relative 0  0 - 1 (%)    Basophils Absolute 0.0  0.0 - 0.1 (K/uL)   CARDIAC PANEL(CRET KIN+CKTOT+MB+TROPI)     Status: Normal   Collection Time   03/30/11 11:17 PM      Component Value Range Comment   Total CK 135  7 - 177 (U/L)    CK, MB 3.3  0.3 - 4.0 (ng/mL)    Troponin I <0.30  <0.30 (ng/mL)    Relative Index 2.4  0.0 - 2.5    COMPREHENSIVE METABOLIC PANEL     Status: Abnormal   Collection Time   03/30/11 11:17 PM      Component Value Range Comment   Sodium 133 (*) 135 - 145 (mEq/L)    Potassium 3.9  3.5 - 5.1 (mEq/L)    Chloride 93 (*) 96 - 112 (mEq/L)    CO2 25  19 - 32 (mEq/L)    Glucose, Bld 294 (*) 70 - 99 (mg/dL)    BUN 18  6 - 23 (mg/dL)    Creatinine, Ser 9.14  0.50 - 1.10 (mg/dL)    Calcium 9.6  8.4 - 10.5 (mg/dL)    Total Protein 6.8  6.0 - 8.3 (g/dL)    Albumin 3.7  3.5 - 5.2 (g/dL)    AST 18  0 - 37 (U/L)    ALT 15  0 - 35 (U/L)    Alkaline Phosphatase 103  39 - 117 (U/L)    Total Bilirubin 0.4  0.3 - 1.2 (mg/dL)    GFR calc non Af Amer 54 (*) >90 (mL/min)    GFR calc Af Amer 63 (*) >90 (mL/min)   LIPASE, BLOOD     Status: Normal   Collection Time   03/30/11 11:17 PM      Component Value Range Comment   Lipase 53  11 - 59 (U/L)   PROTIME-INR     Status: Normal   Collection Time   03/31/11 12:33 AM      Component Value Range Comment    Prothrombin Time 14.1  11.6 - 15.2 (seconds)    INR 1.07  0.00 - 1.49    URINALYSIS, ROUTINE W REFLEX MICROSCOPIC     Status: Abnormal   Collection Time   03/31/11  1:51 AM      Component Value Range Comment   Color, Urine YELLOW  YELLOW     APPearance CLOUDY (*) CLEAR     Specific Gravity, Urine 1.020  1.005 - 1.030  pH 5.0  5.0 - 8.0     Glucose, UA 100 (*) NEGATIVE (mg/dL)    Hgb urine dipstick NEGATIVE  NEGATIVE     Bilirubin Urine SMALL (*) NEGATIVE     Ketones, ur 15 (*) NEGATIVE (mg/dL)    Protein, ur NEGATIVE  NEGATIVE (mg/dL)    Urobilinogen, UA 1.0  0.0 - 1.0 (mg/dL)    Nitrite NEGATIVE  NEGATIVE     Leukocytes, UA MODERATE (*) NEGATIVE    URINE MICROSCOPIC-ADD ON     Status: Abnormal   Collection Time   03/31/11  1:51 AM      Component Value Range Comment   Squamous Epithelial / LPF MANY (*) RARE     WBC, UA 3-6  <3 (WBC/hpf)    RBC / HPF 0-2  <3 (RBC/hpf)    Bacteria, UA FEW (*) RARE     Casts HYALINE CASTS (*) NEGATIVE     Urine-Other MUCOUS PRESENT      Dg Chest 2vsame Day  03/31/2011  *RADIOLOGY REPORT*  Clinical Data: Shortness of breath and productive cough.  CHEST - 2 VIEW SAME DAY  Comparison: Chest radiograph performed 02/18/2011  Findings: The lungs are well-aerated.  Mild right basilar opacity likely reflects chronic scarring, on correlation with multiple prior studies.  There is no evidence of pleural effusion or pneumothorax.  The heart is mildly enlarged; the patient is status with mid sternotomy, with evidence of prior CABG.  A pacemaker is noted at the left chest wall, with two leads ending along the right ventricle.  Calcification is noted along the thoracic aorta.  No acute osseous abnormalities are seen.  IMPRESSION:  1.  Mild right basilar airspace opacity likely reflects chronic scarring, on correlation with multiple prior studies.  No definite acute airspace consolidation seen. 2.  Mild cardiomegaly noted. 3.  Calcification along the thoracic aorta.   Original Report Authenticated By: Tonia Ghent, M.D.    Review of Systems  Constitutional: Positive for malaise/fatigue.  Eyes: Negative.   Cardiovascular: Negative.   Gastrointestinal: Negative.   Genitourinary: Negative.   Musculoskeletal: Negative.   Skin: Negative.   Neurological: Positive for weakness.  Endo/Heme/Allergies: Negative.   Psychiatric/Behavioral: Negative.     Blood pressure 151/78, pulse 74, temperature 98.2 F (36.8 C), temperature source Oral, resp. rate 18, height 5\' 7"  (1.702 m), weight 90.4 kg (199 lb 4.7 oz), SpO2 95.00%. Physical Exam  Constitutional: She is oriented to person, place, and time. She appears well-developed and well-nourished. No distress.  HENT:  Head: Normocephalic and atraumatic.  Right Ear: External ear normal.  Left Ear: External ear normal.  Nose: Nose normal.  Mouth/Throat: Oropharynx is clear and moist. No oropharyngeal exudate.  Eyes: Conjunctivae and EOM are normal. Pupils are equal, round, and reactive to light. Right eye exhibits no discharge. Left eye exhibits no discharge. No scleral icterus.  Neck: Normal range of motion. Neck supple.  Cardiovascular: Normal rate, regular rhythm, normal heart sounds and intact distal pulses.  Exam reveals no gallop and no friction rub.   No murmur heard. Respiratory: Effort normal and breath sounds normal. No respiratory distress. She has no wheezes. She has no rales.  GI: Soft. Bowel sounds are normal. She exhibits no distension. There is no tenderness. There is no rebound.  Musculoskeletal: Normal range of motion. She exhibits no edema and no tenderness.  Neurological: She is alert and oriented to person, place, and time. She has normal reflexes. She displays normal reflexes. No cranial nerve deficit.  She exhibits normal muscle tone. Coordination normal.  Skin: Skin is warm and dry.  Psychiatric: Her behavior is normal.     Assessment/Plan #1. Generalized weakness. #2. Nausea and poor  appetite. #3. Possible UTI. #4. History of diabetes mellitus type 2 uncontrolled. #5. History of CAD status post CABG. #6. History of atrial fibrillation status post AV ablation status post pacemaker placement on Coumadin. #7. Subtherapeutic INR. #8. COPD.  Plan At this time we'll admit for observation to MedSurg floor. We will get PT consult. For now going to hold her Lasix and gently hydrate. Patient does have nausea but her abdomen is benign and LFTs are normal. We will recheck labs. For possible UTI Will place patient on ceftriaxone and get urine cultures. Further recommendations as condition evolves.  Varsha Knock N. 03/31/2011, 6:40 AM

## 2011-03-31 NOTE — Progress Notes (Signed)
Physical Therapy Evaluation Patient Details Name: [name] MRN: [id number] DOB: [birth year] Today's Date: 03/31/2011  Problem List:  Patient Active Problem List  Diagnoses  . HYPOTHYROIDISM  . DM  . ANEMIA, PERNICIOUS  . VON WILLEBRAND'S DISEASE  . ESSENTIAL HYPERTENSION, BENIGN  . CORONARY ARTERY DISEASE  . PAROXYSMAL ATRIAL FIBRILLATION  . CHF  . ASTHMATIC BRONCHITIS, ACUTE  . C O P D  . GALLBLADDER DISEASE  . CHEST WALL PAIN, HX OF  . CARDIAC PACEMAKER IN SITU  . Weakness generalized    Past Medical History:  Past Medical History  Diagnosis Date  . CHF (congestive heart failure)   . Cardiac pacemaker in situ   . Anemia, pernicious   . Atrial fibrillation     S/P AV node ablation  . Von Willebrand's disease   . Gallbladder disease   . Hypothyroidism   . Diabetes mellitus   . CAD (coronary artery disease)   . Acute asthmatic bronchitis   . COPD (chronic obstructive pulmonary disease)   . Shortness of breath    Past Surgical History:  Past Surgical History  Procedure Date  . Coronary artery bypass graft   . Mandibular reconstruction w/ iliac bone graft   . Appendectomy   . Hernia repair     PT Assessment/Plan/Recommendation PT Assessment Clinical Impression Statement: pt with ? decreased safety awareness secondary notes gets dizzy and walks at home.  pt motivated to amb, but will need continued therapy.   PT Recommendation/Assessment: Patient will need skilled PT in the acute care venue PT Problem List: Decreased strength;Decreased activity tolerance;Decreased balance;Decreased mobility;Decreased knowledge of use of DME;Decreased safety awareness Barriers to Discharge: None PT Therapy Diagnosis : Difficulty walking PT Plan PT Frequency: Min 3X/week PT Treatment/Interventions: DME instruction;Stair training;Gait training;Functional mobility training;Therapeutic activities;Therapeutic exercise;Balance training;Patient/family education PT  Recommendation Recommendations for Other Services: OT consult Follow Up Recommendations: Home health PT Equipment Recommended: None recommended by PT PT Goals  Acute Rehab PT Goals PT Goal Formulation: With patient Time For Goal Achievement: 2 weeks Pt will go Supine/Side to Sit: Independently PT Goal: Supine/Side to Sit - Progress: Progressing toward goal Pt will go Sit to Stand: with supervision PT Goal: Sit to Stand - Progress: Progressing toward goal Pt will Ambulate: >150 feet;with supervision;with rolling walker PT Goal: Ambulate - Progress: Progressing toward goal Pt will Go Up / Down Stairs: 3-5 stairs;with min assist;with least restrictive assistive device PT Goal: Up/Down Stairs - Progress: Progressing toward goal  PT Evaluation Precautions/Restrictions  Precautions Precautions: Fall Precaution Comments: pt notes dizziness during amb.  ?need to check orthostatics.   Restrictions Weight Bearing Restrictions: No Prior Functioning  Home Living Lives With: Spouse;Daughter Receives Help From: Family Type of Home: House Home Layout: One level Home Access: Stairs to enter [employer] of Steps: 3 Home Adaptive Equipment: Walker - four wheeled;Straight cane Prior Function Level of Independence: Requires assistive device for independence;Needs assistance with homemaking Able to Take Stairs?: Yes Driving: No Cognition Cognition Arousal/Alertness: Awake/alert Sensation/Coordination   Extremity Assessment RLE Assessment RLE Assessment: Within Functional Limits LLE Assessment LLE Assessment: Within Functional Limits Mobility (including Balance) Bed Mobility Bed Mobility: Yes Supine to Sit: 4: Min assist;With rails;HOB flat Supine to Sit Details (indicate cue type and reason): cues for UE use and encouragement Sitting - Scoot to Edge of Bed: 5: Supervision Transfers Transfers: Yes Sit to Stand: 4: Min assist;With upper extremity assist;From bed Sit to  Stand Details (indicate cue type and reason): cues for use of  UEs and anterior wt shift Stand to Sit: 4: Min assist;With upper extremity assist;To chair/3-in-1 Stand to Sit Details: cues to control descent and get closer to chair prior to sitting Ambulation/Gait Ambulation/Gait: Yes Ambulation/Gait Assistance: 4: Min assist Ambulation/Gait Assistance Details (indicate cue type and reason): pt with one LOB required ModA to correct.  cues to stay within RW.  pt C/O dizziness during amb.   Ambulation Distance (Feet): 20 Feet Assistive device: Rolling walker Gait Pattern: Decreased stride length;Shuffle;Trunk flexed Stairs: No Wheelchair Mobility Wheelchair Mobility: No    Exercise    End of Session PT - End of Session Equipment Utilized During Treatment: Gait belt Activity Tolerance:  (Limited by dizziness) Patient left: in chair;with call bell in reach Nurse Communication: Mobility status for transfers;Mobility status for ambulation General Behavior During Session: [facility] for tasks performed Cognition: [facility] for tasks performed  [name], [place] [phone] 03/31/2011, 12:14 PM

## 2011-03-31 NOTE — Progress Notes (Signed)
ANTICOAGULATION CONSULT NOTE - Initial Consult  Pharmacy Consult for Coumadin Indication: atrial fibrillation  Allergies  Allergen Reactions  . Carbamazepine   . Cefaclor     REACTION: unspecified  . Cephalexin   . Clarithromycin   . Dicyclomine   . Dicyclomine Hcl     REACTION: unspecified  . ZOX:[id number] Acid+Aspartame   . Sulfamethoxazole     REACTION: unspecified  . Sulfonamide Derivatives     Patient Measurements: Height: 5\' 7"  (170.2 cm) Weight: 199 lb 4.7 oz (90.4 kg) IBW/kg (Calculated) : 61.6    Vital Signs: Temp: 98.2 F (36.8 C) (12/04 0530) Temp src: Oral (12/04 0530) BP: 151/78 mmHg (12/04 0530) Pulse Rate: 74  (12/04 0530)  Labs:  Basename 03/31/11 0033 03/30/11 2317  HGB -- 12.5  HCT -- 38.6  PLT -- 241  APTT -- --  LABPROT 14.1 --  INR 1.07 --  HEPARINUNFRC -- --  CREATININE -- 0.94  CKTOTAL -- 135  CKMB -- 3.3  TROPONINI -- <0.30   Estimated Creatinine Clearance: 51.4 ml/min (by C-G formula based on Cr of 0.94).  Medical History: Past Medical History  Diagnosis Date  . CHF (congestive heart failure)   . Cardiac pacemaker in situ   . Anemia, pernicious   . Atrial fibrillation     S/P AV node ablation  . Von Willebrand's disease   . Gallbladder disease   . Hypothyroidism   . Diabetes mellitus   . CAD (coronary artery disease)   . Acute asthmatic bronchitis   . COPD (chronic obstructive pulmonary disease)   . Shortness of breath     Medications:  Per patient, on Coumadin 5mg  daily except 2.5mg  on Thursday (she thinks)  Goal of Therapy:  INR 2-3  Assessment: 77 yo F presents to the ED with complaints of nausea, weakness, muscle cramps and back pain for the last 7 days. The patient is unsure of her current Coumadin dose and with an INR 1.07 I would question her compliance with taking the medication.   Plan:  Coumadin 7.5mg  tonight. Daily INR.  [employer], Pharm.D., BCPS Clinical  Pharmacist Pager [phone]  03/31/2011,6:51 AM

## 2011-03-31 NOTE — ED Notes (Signed)
Pt. Returned from X-ray via Doctor, general practice, NAD noted

## 2011-03-31 NOTE — ED Provider Notes (Cosign Needed)
History     CSN: [id number] Arrival date & time: 03/30/2011  9:51 PM   First MD Initiated Contact with Patient 03/30/11 2303      Chief Complaint  Patient presents with  . Nausea  . Shortness of Breath    (Consider location/radiation/quality/duration/timing/severity/associated sxs/prior treatment) HPI 77 year old female presents to emergency department with complaint of nausea, weakness, muscle cramps and back pain for the last 7 days. Patient also reports shortness of breath ongoing for the last year. Patient reports no vomiting, has not eaten in the last 24 hours secondary to severe nausea.. Patient has not contact her primary care doctor regarding his symptoms, and she says that she has been too weak to get out of bed. Patient reports she was last admitted to the hospital about a month ago for similar complaints. Patient denies chest pain, no vomiting no diarrhea, no sick contacts no fever no chills. Patient reports last time she felt this way her potassium was low. Patient reports her shortness of breath is always better when she has cool air on her face. Per EMS report patient had nasal cannula in but no oxygen flowing when they arrived. Patient has been 100% on room air during her visit here.  Past Medical History  Diagnosis Date  . CHF (congestive heart failure)   . Cardiac pacemaker in situ   . Anemia, pernicious   . Atrial fibrillation     S/P AV node ablation  . Von Willebrand's disease   . Gallbladder disease   . Hypothyroidism   . Diabetes mellitus   . CAD (coronary artery disease)   . Acute asthmatic bronchitis   . COPD (chronic obstructive pulmonary disease)     Past Surgical History  Procedure Date  . Coronary artery bypass graft   . Mandibular reconstruction w/ iliac bone graft   . Appendectomy   . Hernia repair     History reviewed. No pertinent family history.  History  Substance Use Topics  . Smoking status: Former Smoker    Quit date: 03/29/1989  .  Smokeless tobacco: Not on file  . Alcohol Use: No    OB History    Grav Para Term Preterm Abortions TAB SAB Ect Mult Living                  Review of Systems  All other systems reviewed and are negative.    Allergies  Carbamazepine; Cefaclor; Cephalexin; Clarithromycin; Dicyclomine; Dicyclomine hcl; XBJ:YNWGNFAOZHY+QMVHQIONG+EXBMWUXLKG acid+aspartame; Sulfamethoxazole; and Sulfonamide derivatives  Home Medications   Current Outpatient Rx  Name Route Sig Dispense Refill  . BUDESONIDE-FORMOTEROL FUMARATE 160-4.5 MCG/ACT IN AERO Inhalation Inhale 1 puff into the lungs 2 (two) times daily.      . CYANOCOBALAMIN 1000 MCG/ML IJ SOLN Intramuscular Inject 1,000 mcg into the muscle. Take twice a month.     . FUROSEMIDE 40 MG PO TABS Oral Take 40 mg by mouth daily.      . INSULIN GLARGINE 100 UNIT/ML [place] SOLN Subcutaneous Inject 32 Units into the skin every morning.      . INSULIN LISPRO (HUMAN) 100 UNIT/ML [place] SOLN Subcutaneous Inject 10-12 Units into the skin 3 (three) times daily as needed. Give 10 units three times a day if blood sugar less than 225. Give 12 units three times a day if blood sugar 225 or higher.    Marland Kitchen LEVALBUTEROL TARTRATE 45 MCG/ACT IN AERO Inhalation Inhale 1-2 puffs into the lungs 4 (four) times daily.      Marland Kitchen  METOPROLOL SUCCINATE ER 100 MG PO TB24  Take 1/2 tablet daily 30 tablet 6  . NITROGLYCERIN 0.4 MG SL SUBL Sublingual Place 0.4 mg under the tongue every 5 (five) minutes as needed. For chest pain.    Marland Kitchen PANTOPRAZOLE SODIUM 40 MG PO TBEC Oral Take 40 mg by mouth daily.      Marland Kitchen POTASSIUM CHLORIDE CRYS CR 10 MEQ PO TBCR Oral Take 10 mEq by mouth daily.      . SERTRALINE HCL 100 MG PO TABS Oral Take 100 mg by mouth daily.      . WARFARIN SODIUM 5 MG PO TABS Oral Take 5 mg by mouth daily. Takes 0.5 on Thursdays.      BP 120/54  Pulse 71  Temp(Src) 98.2 F (36.8 C) (Oral)  Resp 20  Ht 5\' 7"  (1.702 m)  Wt 198 lb (89.812 kg)  BMI 31.01 kg/m2  SpO2 100%  Physical  Exam  Nursing note and vitals reviewed. Constitutional: She is oriented to person, place, and time. She appears well-developed and well-nourished.       Vitals normal, patient in no distress very talkative  HENT:  Head: Normocephalic and atraumatic.  Nose: Nose normal.  Mouth/Throat: Oropharynx is clear and moist.  Eyes: Conjunctivae and EOM are normal. Pupils are equal, round, and reactive to light.  Neck: Normal range of motion. Neck supple. No JVD present. No tracheal deviation present. No thyromegaly present.  Cardiovascular: Normal rate, regular rhythm, normal heart sounds and intact distal pulses.  Exam reveals no gallop and no friction rub.   No murmur heard. Pulmonary/Chest: Effort normal. No stridor. No respiratory distress. She has wheezes (ooccasional end expiratory wheezes noted mainly in right lung). She has no rales. She exhibits no tenderness.  Abdominal: Soft. Bowel sounds are normal. She exhibits no distension and no mass. There is no tenderness. There is no rebound and no guarding.  Musculoskeletal: Normal range of motion. She exhibits edema (trace edema in feet) and tenderness (diffuse tenderness across entire body with palpation).  Lymphadenopathy:    She has no cervical adenopathy.  Neurological: She is oriented to person, place, and time. She exhibits normal muscle tone. Coordination normal.  Skin: Skin is dry. No rash noted. No erythema. No pallor.  Psychiatric: She has a normal mood and affect. Her behavior is normal. Judgment and thought content normal.    ED Course  Procedures (including critical care time)  Labs Reviewed  CBC - Abnormal; Notable for the following:    RBC 5.23 (*)    MCV 73.8 (*)    MCH 23.9 (*)    RDW 16.6 (*)    All other components within normal limits  DIFFERENTIAL  PRO B NATRIURETIC PEPTIDE  CARDIAC PANEL(CRET KIN+CKTOT+MB+TROPI)  COMPREHENSIVE METABOLIC PANEL  LIPASE, BLOOD  PROTIME-INR   Dg Chest 2vsame Day  03/31/2011   *RADIOLOGY REPORT*  Clinical Data: Shortness of breath and productive cough.  CHEST - 2 VIEW SAME DAY  Comparison: Chest radiograph performed 02/18/2011  Findings: The lungs are well-aerated.  Mild right basilar opacity likely reflects chronic scarring, on correlation with multiple prior studies.  There is no evidence of pleural effusion or pneumothorax.  The heart is mildly enlarged; the patient is status with mid sternotomy, with evidence of prior CABG.  A pacemaker is noted at the left chest wall, with two leads ending along the right ventricle.  Calcification is noted along the thoracic aorta.  No acute osseous abnormalities are seen.  IMPRESSION:  1.  Mild right basilar airspace opacity likely reflects chronic scarring, on correlation with multiple prior studies.  No definite acute airspace consolidation seen. 2.  Mild cardiomegaly noted. 3.  Calcification along the thoracic aorta.  Original Report Authenticated By: Tonia Ghent, M.D.    Date: 03/31/2011  Rate: 70  Rhythm: electronically paced  QRS Axis: left  Intervals: paced rhythm  ST/T Wave abnormalities: paced  Conduction Disutrbances:paced  Narrative Interpretation:   Old EKG Reviewed: unchanged   No diagnosis found.    MDM  77 year old female with generalized weakness, nausea and chronic ongoing shortness of breath. Differential includes congestive heart failure exacerbation, COPD exacerbation, pneumonia, hypokalemia, cardiac ischemia. Patient appears overall well, however and no signs of fluid overload on exam and no overt wheezing. May be viral syndrome given her nausea. Check full set of labs and chest x-ray. If normal will have her follow up with her primary care Dr.        Olivia Mackie, MD 03/31/11 0102  2:21 AM Patient reports persistent weakness and does not feel like she is safe to go home. Patient ambulated to the bathroom with assistance, and stumbled and was able to make it the entire way. Reattempted on return  from the bathroom when she stumbled and nearly fell again. Expect patient will need admission to the hospital given her unsteadiness and inability to walk  Olivia Mackie, MD 03/31/11 0222

## 2011-03-31 NOTE — ED Notes (Signed)
Pt given peanut butter crackers and applesauce.  Aware of need for urine.

## 2011-04-01 ENCOUNTER — Inpatient Hospital Stay (HOSPITAL_COMMUNITY): Payer: Medicare Other

## 2011-04-01 LAB — COMPREHENSIVE METABOLIC PANEL
ALT: 12 U/L (ref 0–35)
AST: 17 U/L (ref 0–37)
Albumin: 3.1 g/dL — ABNORMAL LOW (ref 3.5–5.2)
Alkaline Phosphatase: 89 U/L (ref 39–117)
Calcium: 8.8 mg/dL (ref 8.4–10.5)
Glucose, Bld: 220 mg/dL — ABNORMAL HIGH (ref 70–99)
Potassium: 4.3 mEq/L (ref 3.5–5.1)
Sodium: 139 mEq/L (ref 135–145)
Total Protein: 5.9 g/dL — ABNORMAL LOW (ref 6.0–8.3)

## 2011-04-01 LAB — CBC
MCV: 76.9 fL — ABNORMAL LOW (ref 78.0–100.0)
Platelets: 188 10*3/uL (ref 150–400)
RBC: 4.64 MIL/uL (ref 3.87–5.11)
RDW: 17.2 % — ABNORMAL HIGH (ref 11.5–15.5)
WBC: 5.3 10*3/uL (ref 4.0–10.5)

## 2011-04-01 LAB — PROTIME-INR: INR: 1.47 (ref 0.00–1.49)

## 2011-04-01 LAB — GLUCOSE, CAPILLARY
Glucose-Capillary: 207 mg/dL — ABNORMAL HIGH (ref 70–99)
Glucose-Capillary: 315 mg/dL — ABNORMAL HIGH (ref 70–99)

## 2011-04-01 MED ORDER — INSULIN ASPART 100 UNIT/ML ~~LOC~~ SOLN
0.0000 [IU] | Freq: Three times a day (TID) | SUBCUTANEOUS | Status: DC
  Administered 2011-04-02: 7 [IU] via SUBCUTANEOUS
  Administered 2011-04-02: 2 [IU] via SUBCUTANEOUS
  Administered 2011-04-02: 9 [IU] via SUBCUTANEOUS
  Administered 2011-04-03: 5 [IU] via SUBCUTANEOUS
  Administered 2011-04-03: 3 [IU] via SUBCUTANEOUS
  Filled 2011-04-01: qty 3

## 2011-04-01 MED ORDER — WARFARIN SODIUM 5 MG PO TABS
5.0000 mg | ORAL_TABLET | Freq: Once | ORAL | Status: AC
  Administered 2011-04-01: 5 mg via ORAL
  Filled 2011-04-01: qty 1

## 2011-04-01 MED ORDER — INSULIN GLARGINE 100 UNIT/ML ~~LOC~~ SOLN
40.0000 [IU] | Freq: Every day | SUBCUTANEOUS | Status: DC
  Administered 2011-04-02 – 2011-04-03 (×2): 40 [IU] via SUBCUTANEOUS

## 2011-04-01 MED ORDER — INSULIN ASPART 100 UNIT/ML ~~LOC~~ SOLN: 0.0000 [IU] | Freq: Every day | SUBCUTANEOUS | Status: DC

## 2011-04-01 MED ORDER — PREDNISONE 20 MG PO TABS
40.0000 mg | ORAL_TABLET | Freq: Every day | ORAL | Status: DC
  Administered 2011-04-01 – 2011-04-03 (×3): 40 mg via ORAL
  Filled 2011-04-01 (×5): qty 2

## 2011-04-01 MED ORDER — INSULIN GLARGINE 100 UNIT/ML ~~LOC~~ SOLN
14.0000 [IU] | Freq: Every day | SUBCUTANEOUS | Status: DC
  Administered 2011-04-01: 14 [IU] via SUBCUTANEOUS

## 2011-04-01 MED ORDER — ALBUTEROL SULFATE (5 MG/ML) 0.5% IN NEBU
2.5000 mg | INHALATION_SOLUTION | Freq: Three times a day (TID) | RESPIRATORY_TRACT | Status: DC
  Administered 2011-04-01 – 2011-04-03 (×5): 2.5 mg via RESPIRATORY_TRACT
  Filled 2011-04-01 (×6): qty 0.5

## 2011-04-01 NOTE — Progress Notes (Signed)
PCP: [name], MD  Brief narrative: 77 year-old female history of CAD status post CABG, history of atrial fibrillation status post AV ablation status post pacemaker placement, COPD, diabetes mellitus type 2, von Willebrand's disease came to the ER because of increasing weakness over the last week. Patient usually uses a walker to walk around. But she states that now is feeling profoundly weak that she is unable to walk even with the help of walker. In addition patient is having nausea but denies any vomiting or abdominal pain. In the ER patient had labs which are normal except for possible UTI. Patient needed help in the ER to be mobilized. Patient was admitted for similar complaints last month. And at that time stroke was ruled out.  Past medical history:  CAD status post CABG, history of atrial fibrillation status post AV ablation status post pacemaker placement, COPD, diabetes mellitus type 2, von Willebrand's disease  Consultants: None so far  Procedures: None so far   Subjective: Patient continues to complain of nausea but no emesis. Also complaining of cough with clear expectoration. Weakness persists. No new complaints. Able to ambulate with assistance.  Objective: Vital signs in last 24 hours: Temp:  [97 F (36.1 C)-99.5 F (37.5 C)] 99.5 F (37.5 C) (12/05 1424) Pulse Rate:  [63-73] 63  (12/05 1424) Resp:  [18-20] 20  (12/05 1424) BP: (109-134)/(67-79) 109/67 mmHg (12/05 0550) SpO2:  [94 %-100 %] 97 % (12/05 1528) Weight:  [91.8 kg (202 lb 6.1 oz)] 202 lb 6.1 oz (91.8 kg) (12/05 0550) Weight change: 1.988 kg (4 lb 6.1 oz) Last BM Date: 03/31/11  Intake/Output from previous day: 12/04 0701 - 12/05 0700 In: 420 [P.O.:420] Out: 1850 [Urine:1850] Intake/Output this shift: Total I/O In: 300 [P.O.:300] Out: 250 [Urine:250]  General appearance: alert, cooperative, appears stated age and no distress Head: Normocephalic, without obvious abnormality, atraumatic Throat:  lips, mucosa, and tongue normal; teeth and gums normal Neck: no adenopathy, no carotid bruit, no JVD, supple, symmetrical, trachea midline and thyroid not enlarged, symmetric, no tenderness/mass/nodules Resp: scattered wheezes bilaterally. No crackles. Cardio: regular rate and rhythm, S1, S2 normal, no murmur, click, rub or gallop GI: Soft, mildly tender in right upper quadrant. No rebound. No murphy's. BS present. Extremities: extremities normal, atraumatic, no cyanosis or edema Skin: Skin color, texture, turgor normal. No rashes or lesions Lymph nodes: Cervical, supraclavicular, and axillary nodes normal. Neurologic: Grossly normal  Lab Results:  [facility] 04/01/11 0611 03/31/11 0655  WBC 5.3 6.2  HGB 10.9* 11.2*  HCT 35.7* 36.4  PLT 188 197   BMET  Basename 04/01/11 0611 03/31/11 0655  NA 139 132*  K 4.3 4.5  CL 101 93*  CO2 30 30  GLUCOSE 220* 391*  BUN 16 17  CREATININE 0.88 0.97  CALCIUM 8.8 8.8    Studies/Results: US Abdomen Complete  04/01/2011  *RADIOLOGY REPORT*  Clinical Data:  Nausea.  COMPLETE ABDOMINAL ULTRASOUND  Comparison:  10/19/2008  Findings:  Gallbladder:  No gallstones, gallbladder wall thickening, or pericholecystic fluid.  Common bile duct:   Within normal limits in caliber.  Liver:  No focal lesion identified.  Within normal limits in parenchymal echogenicity.  IVC:  Appears normal.  Pancreas:  Not well visualized due to overlying bowel gas.  Spleen:  Within normal limits in size and echotexture.  Right Kidney:   Cortical thinning.  10.0 cm.  No focal abnormality or hydronephrosis.  Left Kidney:  Cortical thinning.  11.7 cm.  No focal abnormality or hydronephrosis.  Abdominal aorta:  Visualized portions are nonaneurysmal, maximally 2.4 cm.  IMPRESSION: No acute findings in the abdomen.  Midline structures difficult to visualize due to overlying bowel gas.  Original Report Authenticated By: Cyndie Chime, M.D.   Dg Chest 2vsame Day  03/31/2011  *RADIOLOGY  REPORT*  Clinical Data: Shortness of breath and productive cough.  CHEST - 2 VIEW SAME DAY  Comparison: Chest radiograph performed 02/18/2011  Findings: The lungs are well-aerated.  Mild right basilar opacity likely reflects chronic scarring, on correlation with multiple prior studies.  There is no evidence of pleural effusion or pneumothorax.  The heart is mildly enlarged; the patient is status with mid sternotomy, with evidence of prior CABG.  A pacemaker is noted at the left chest wall, with two leads ending along the right ventricle.  Calcification is noted along the thoracic aorta.  No acute osseous abnormalities are seen.  IMPRESSION:  1.  Mild right basilar airspace opacity likely reflects chronic scarring, on correlation with multiple prior studies.  No definite acute airspace consolidation seen. 2.  Mild cardiomegaly noted. 3.  Calcification along the thoracic aorta.  Original Report Authenticated By: Tonia Ghent, M.D.    Medications:  Scheduled:    . albuterol  2.5 mg Nebulization TID  . budesonide-formoterol  1 puff Inhalation BID  . cyanocobalamin  1,000 mcg Intramuscular Q30 days  . doxycycline  100 mg Oral Q12H  . influenza  inactive virus vaccine  0.5 mL Intramuscular Tomorrow-1000  . insulin aspart  0-9 Units Subcutaneous TID WC  . insulin aspart  10 Units Subcutaneous Once  . insulin aspart  15 Units Subcutaneous Once  . insulin glargine  14 Units Subcutaneous QHS  . insulin glargine  40 Units Subcutaneous Daily  . metoprolol  50 mg Oral Daily  . pantoprazole  40 mg Oral Daily  . potassium chloride  10 mEq Oral Daily  . predniSONE  40 mg Oral Q breakfast  . sertraline  100 mg Oral Daily  . warfarin  5 mg Oral ONCE-1800  . warfarin  7.5 mg Oral ONCE-1800  . DISCONTD: albuterol  2.5 mg Nebulization Q6H  . DISCONTD: insulin aspart  15 Units Subcutaneous Once  . DISCONTD: insulin glargine  10 Units Subcutaneous QHS  . DISCONTD: insulin glargine  32 Units Subcutaneous Daily    . DISCONTD: levalbuterol  1-2 puff Inhalation QID    Principal Problem:  *Weakness generalized Active Problems:  HYPOTHYROIDISM  DM  CORONARY ARTERY DISEASE  PAROXYSMAL ATRIAL FIBRILLATION   Assessment/Plan: #1 generalized weakness: Etiology remains unclear. She does not have any focal neurological deficits. I believe this may be due to a combination of her bronchitis, UTI, Hyperglycemia. PT/OT will be involved.  #2 urinary tract infection: Continue with doxycycline. Patient has multiple antibiotic allergies. Gram neg rods on urine culture though low colony counts.  #3 nausea: Ultrasound did not show any obvious abnormalities. Will proceed with HIDA scan. Continue symptomatic treatment. Continue PPI.  #4 mild bronchitis: Add steroids to nebulizer treatments as symptoms not much better.  #5 poorly controlled diabetes, type II: HbA1c is 14. We will put her on night dose of Lantus, as well. With steroids blood sugars will rise. Will adjust further.  #6 mild hyponatremia has resolved  #7 mild anemia: Stable  Rest of her medical issues including hypothyroidism, coronary artery disease , and paroxysmal A. fib, are stable. Warfarin is being dosed by pharmacy.  DVT, prophylaxis with SCDs.    LOS: 2 days  Maurina Fawaz 04/01/2011, 4:11 PM

## 2011-04-01 NOTE — Progress Notes (Signed)
ANTICOAGULATION CONSULT NOTE - Follow Up Consult  Pharmacy Consult for:  Coumadin Indication: atrial fibrillation  Allergies  Allergen Reactions  . Carbamazepine Diarrhea  . Cefaclor     REACTION: unspecified  . Cephalexin Hives  . Clarithromycin Hives  . Dicyclomine Diarrhea and Nausea And Vomiting  . Dicyclomine Hcl     REACTION: unspecified  . Sulfonamide Derivatives Hives  . BMW:[id number] Acid+Aspartame Rash  . Sulfamethoxazole Rash    REACTION: unspecified    Patient Measurements: Height: 5\' 6"  (167.6 cm) Weight: 202 lb 6.1 oz (91.8 kg) IBW/kg (Calculated) : 59.3    Vital Signs: Temp: 97.2 F (36.2 C) (12/05 0550) Temp src: Oral (12/05 0550) BP: 109/67 mmHg (12/05 0550) Pulse Rate: 73  (12/05 0550)  Labs:  Basename 04/01/11 7564 03/31/11 0655 03/31/11 0033 03/30/11 2317  HGB 10.9* 11.2* -- --  HCT 35.7* 36.4 -- 38.6  PLT 188 197 -- 241  APTT -- -- -- --  LABPROT 18.1* -- 14.1 --  INR 1.47 -- 1.07 --  HEPARINUNFRC -- -- -- --  CREATININE 0.88 0.97 -- 0.94  CKTOTAL -- -- -- 135  CKMB -- -- -- 3.3  TROPONINI -- -- -- <0.30   Estimated Creatinine Clearance: 54.3 ml/min (by C-G formula based on Cr of 0.88).   Medications:  Scheduled:    . albuterol  2.5 mg Nebulization Q6H  . budesonide-formoterol  1 puff Inhalation BID  . cyanocobalamin  1,000 mcg Intramuscular Q30 days  . doxycycline  100 mg Oral Q12H  . influenza  inactive virus vaccine  0.5 mL Intramuscular Tomorrow-1000  . insulin aspart  0-9 Units Subcutaneous TID WC  . insulin aspart  10 Units Subcutaneous Once  . insulin aspart  15 Units Subcutaneous Once  . insulin aspart  15 Units Subcutaneous Once  . insulin glargine  10 Units Subcutaneous QHS  . insulin glargine  32 Units Subcutaneous Daily  . metoprolol  50 mg Oral Daily  . pantoprazole  40 mg Oral Daily  . potassium chloride  10 mEq Oral Daily  . sertraline  100 mg Oral Daily  . warfarin  7.5 mg Oral ONCE-1800    . DISCONTD: insulin aspart  15 Units Subcutaneous Once  . DISCONTD: levalbuterol  1-2 puff Inhalation QID   Infusions:    . sodium chloride 75 mL/hr (04/01/11 0250)    Assessment: Patient is a 77 year-old female history of CAD status post CABG, history of atrial fibrillation status post AV ablation status post pacemaker placement.  She is on chronic home Coumadin for VTE prophylaxis.  Goal of Therapy:  INR 2-3   Plan:  Coumadin 5mg  today.  Cherae Marton, Elisha Headland, Pharm.D. 04/01/2011 11:00 AM

## 2011-04-02 ENCOUNTER — Ambulatory Visit: Payer: Medicare Other | Admitting: Cardiology

## 2011-04-02 ENCOUNTER — Inpatient Hospital Stay (HOSPITAL_COMMUNITY): Payer: Medicare Other

## 2011-04-02 DIAGNOSIS — N390 Urinary tract infection, site not specified: Secondary | ICD-10-CM | POA: Diagnosis present

## 2011-04-02 DIAGNOSIS — E1165 Type 2 diabetes mellitus with hyperglycemia: Secondary | ICD-10-CM | POA: Diagnosis present

## 2011-04-02 DIAGNOSIS — R110 Nausea: Secondary | ICD-10-CM | POA: Diagnosis present

## 2011-04-02 LAB — GLUCOSE, CAPILLARY
Glucose-Capillary: 317 mg/dL — ABNORMAL HIGH (ref 70–99)
Glucose-Capillary: 376 mg/dL — ABNORMAL HIGH (ref 70–99)
Glucose-Capillary: 428 mg/dL — ABNORMAL HIGH (ref 70–99)

## 2011-04-02 LAB — URINE CULTURE
Colony Count: 40000
Culture  Setup Time: 201212041312

## 2011-04-02 LAB — PROTIME-INR
INR: 1.81 — ABNORMAL HIGH (ref 0.00–1.49)
Prothrombin Time: 21.3 seconds — ABNORMAL HIGH (ref 11.6–15.2)

## 2011-04-02 MED ORDER — INSULIN ASPART 100 UNIT/ML ~~LOC~~ SOLN
8.0000 [IU] | Freq: Once | SUBCUTANEOUS | Status: AC
  Administered 2011-04-02: 8 [IU] via SUBCUTANEOUS

## 2011-04-02 MED ORDER — TECHNETIUM TC 99M MEBROFENIN IV KIT
5.0000 | PACK | Freq: Once | INTRAVENOUS | Status: AC | PRN
  Administered 2011-04-02: 5 via INTRAVENOUS

## 2011-04-02 MED ORDER — INSULIN ASPART 100 UNIT/ML ~~LOC~~ SOLN
8.0000 [IU] | Freq: Three times a day (TID) | SUBCUTANEOUS | Status: DC
  Administered 2011-04-02 – 2011-04-03 (×3): 8 [IU] via SUBCUTANEOUS

## 2011-04-02 MED ORDER — INSULIN GLARGINE 100 UNIT/ML ~~LOC~~ SOLN
18.0000 [IU] | Freq: Every day | SUBCUTANEOUS | Status: DC
  Administered 2011-04-02: 18 [IU] via SUBCUTANEOUS

## 2011-04-02 MED ORDER — SINCALIDE 5 MCG IJ SOLR
0.0200 ug/kg | Freq: Once | INTRAMUSCULAR | Status: AC
  Administered 2011-04-02: 1.9 ug via INTRAVENOUS

## 2011-04-02 MED ORDER — WARFARIN SODIUM 5 MG PO TABS
5.0000 mg | ORAL_TABLET | Freq: Once | ORAL | Status: AC
  Administered 2011-04-02: 5 mg via ORAL
  Filled 2011-04-02: qty 1

## 2011-04-02 NOTE — Progress Notes (Signed)
ANTICOAGULATION CONSULT NOTE - Follow Up Consult  Pharmacy Consult for Coumadin Indication: atrial fibrillation  Allergies  Allergen Reactions  . Carbamazepine Diarrhea  . Cefaclor     REACTION: unspecified  . Cephalexin Hives  . Clarithromycin Hives  . Dicyclomine Diarrhea and Nausea And Vomiting  . Dicyclomine Hcl     REACTION: unspecified  . Sulfonamide Derivatives Hives  . HQI:[id number] Acid+Aspartame Rash  . Sulfamethoxazole Rash    REACTION: unspecified    Patient Measurements: Height: 5\' 6"  (167.6 cm) Weight: 204 lb 5.9 oz (92.7 kg) (4500 Stand up scale) IBW/kg (Calculated) : 59.3   Vital Signs: Temp: 97.8 F (36.6 C) (12/06 0500) Temp src: Oral (12/06 0500) BP: 145/78 mmHg (12/06 0500) Pulse Rate: 84  (12/06 1027)  Labs:  Basename 04/02/11 0617 04/01/11 4259 03/31/11 0655 03/31/11 0033 03/30/11 2317  HGB -- 10.9* 11.2* -- --  HCT -- 35.7* 36.4 -- 38.6  PLT -- 188 197 -- 241  APTT -- -- -- -- --  LABPROT 21.3* 18.1* -- 14.1 --  INR 1.81* 1.47 -- 1.07 --  HEPARINUNFRC -- -- -- -- --  CREATININE -- 0.88 0.97 -- 0.94  CKTOTAL -- -- -- -- 135  CKMB -- -- -- -- 3.3  TROPONINI -- -- -- -- <0.30   Estimated Creatinine Clearance: 54.6 ml/min (by C-G formula based on Cr of 0.88).   Assessment: 86 YOF on coumadin for afib. INR 1.81, subtherapeutic but trending up. no bleeding per chart  Goal of Therapy:  INR 2-3   Plan:  Coumadin 5mg  po x 1 F/u INR in am  Riki Rusk 04/02/2011,12:41 PM

## 2011-04-02 NOTE — Progress Notes (Signed)
PT Cancellation Note  PT attempted earlier today however pt out of room for procedure, came back this afternoon and pt refusing 2x as she was snacking initially and then eating a meal later. PT educated on importance of mobility, pt reported nicely that she would "much rather eat than do that" with reference to PT. Will attempt at a later time. Thank you!   Careem Yasui (Beverely Pace) Carleene Mains PT, DPT Acute Rehabilitation [phone]

## 2011-04-02 NOTE — Progress Notes (Signed)
Inpatient Diabetes Program Recommendations  AACE/ADA: New Consensus Statement on Inpatient Glycemic Control (2009)  Target Ranges:  Prepandial:   less than 140 mg/dL      Peak postprandial:   less than 180 mg/dL (1-2 hours)      Critically ill patients:  140 - 180 mg/dL   Reason for Visit: CBG's continue to be elevated.  Inpatient Diabetes Program Recommendations Insulin - Basal: Note Lantus increased today. Insulin - Meal Coverage: Please consider adding Novolog meal coverage 8 units tid with meals.  (Note patient takes Novolog 10 to 12 units with meals at home.)

## 2011-04-02 NOTE — Progress Notes (Signed)
Occupational Therapy Evaluation Patient Details Name: [name] MRN: [id number] DOB: [birth year] Today's Date: 04/02/2011  Problem List:  Patient Active Problem List  Diagnoses  . HYPOTHYROIDISM  . ANEMIA, PERNICIOUS  . VON WILLEBRAND'S DISEASE  . ESSENTIAL HYPERTENSION, BENIGN  . CORONARY ARTERY DISEASE  . PAROXYSMAL ATRIAL FIBRILLATION  . CHF  . ASTHMATIC BRONCHITIS, ACUTE  . C O P D  . GALLBLADDER DISEASE  . CHEST WALL PAIN, HX OF  . CARDIAC PACEMAKER IN SITU  . Weakness generalized  . UTI (lower urinary tract infection)  . Nausea  . DM (diabetes mellitus), type 2, uncontrolled    Past Medical History:  Past Medical History  Diagnosis Date  . CHF (congestive heart failure)   . Cardiac pacemaker in situ   . Anemia, pernicious   . Atrial fibrillation     S/P AV node ablation  . Von Willebrand's disease   . Gallbladder disease   . Hypothyroidism   . Diabetes mellitus   . CAD (coronary artery disease)   . Acute asthmatic bronchitis   . COPD (chronic obstructive pulmonary disease)   . Shortness of breath    Past Surgical History:  Past Surgical History  Procedure Date  . Coronary artery bypass graft   . Mandibular reconstruction w/ iliac bone graft   . Appendectomy   . Hernia repair     OT Assessment/Plan/Recommendation OT Assessment Clinical Impression Statement: This 77 y.o. female presents to OT with generalized weakness, and mild balance deficits.  Pt appears to be close to her baseline level of functioning; however, pt. noted with memory defictis/decreased recall of information, and provided contraditory information to PT yesterday than what what she reported today re: her pre-morbid status.  Family is not available to verify information.  Given these inconsistencies, recommend HHOT  at D/C to ensure that pt. is truly functioning at her baseline OT Recommendation/Assessment: Patient will need skilled OT in the acute care venue OT Problem List: Decreased  strength;Cardiopulmonary status limiting activity;Decreased safety awareness;Decreased cognition Barriers to Discharge: None OT Therapy Diagnosis : Generalized weakness OT Plan OT Frequency: Min 2X/week OT Treatment/Interventions: Self-care/ADL training;DME and/or AE instruction;Therapeutic activities;Balance training;Patient/family education OT Recommendation Follow Up Recommendations: Home health OT Equipment Recommended: None recommended by OT Individuals Consulted Consulted and Agree with Results and Recommendations: Patient OT Goals Acute Rehab OT Goals OT Goal Formulation: With patient Time For Goal Achievement: 2 weeks ADL Goals Pt Will Perform Grooming: Independently;Standing at sink Pt Will Perform Upper Body Bathing: Independently;Sitting, chair;Sitting, edge of bed Pt Will Perform Lower Body Bathing: Independently;Sit to stand from chair;Sit to stand from bed Pt Will Perform Upper Body Dressing: Independently;Sitting, chair;Sitting, bed Pt Will Perform Lower Body Dressing: Independently;Sit to stand from chair;Sit to stand from bed Pt Will Transfer to Toilet: Independently;Ambulation;Comfort height toilet;Grab bars Pt Will Perform Toileting - Clothing Manipulation: Independently;Standing  OT Evaluation Precautions/Restrictions  Precautions Precautions: Fall Precaution Comments: Pt. denies dizziness today.  Dynamap would not read BP, but pt asymptomatic Restrictions Weight Bearing Restrictions: No Prior Functioning     ADL ADL Eating/Feeding: Simulated Where Assessed - Eating/Feeding: Bed level Grooming: Performed;Wash/dry hands;Supervision/safety Where Assessed - Grooming: Standing at sink Upper Body Bathing: Simulated;Supervision/safety Where Assessed - Upper Body Bathing: Sitting, chair;Sitting, bed Lower Body Bathing: Simulated;Supervision/safety Where Assessed - Lower Body Bathing: Sit to stand from chair;Sit to stand from bed Upper Body Dressing:  Simulated;Set up Where Assessed - Upper Body Dressing: Sitting, chair;Sitting, bed Lower Body Dressing: Simulated;Supervision/safety Where Assessed - Lower Body  Dressing: Sit to stand from chair;Sit to stand from bed Toilet Transfer: Performed;Supervision/safety Toilet Transfer Method: Proofreader: Regular height toilet;Grab bars Toileting - Clothing Manipulation: Performed;Supervision/safety Where Assessed - Glass blower/designer Manipulation: Standing Toileting - Hygiene: Performed;Supervision/safety Where Assessed - Toileting Hygiene: Sit on 3-in-1 or toilet Equipment Used:  (Pt refused RW use.  Furniture walks in room ) ADL Comments: Pt. moves quickly requiring min verbal cues for safety due to IV - she reports this is her baseline.  Pt. provided conflicting info regarding baseline level of function to OT than she did to PT yesterday.  Also, pt. with no recall of why she is NPO, although RN reports she explained it to her multiple times   Vision/Perception  Vision - Assessment Vision Assessment: Vision not tested Cognition Cognition Arousal/Alertness: Awake/alert Overall Cognitive Status: Impaired Memory: Appears impaired Memory Deficits: Family not present to determine if this is new or pt. baseline Safety/Judgement: Decreased safety judgement for tasks assessed Sensation/Coordination Coordination Gross Motor Movements are Fluid and Coordinated: Yes Fine Motor Movements are Fluid and Coordinated: Yes Extremity Assessment RUE Assessment RUE Assessment: Within Functional Limits LUE Assessment LUE Assessment: Within Functional Limits Mobility  Bed Mobility Bed Mobility: Yes Supine to Sit: 5: Supervision Sitting - Scoot to Edge of Bed: 7: Independent Transfers Transfers: Yes Sit to Stand: 5: Supervision Sit to Stand Details (indicate cue type and reason): Pt. denies dizziness.  Attempted to take BP;however, unable to get an accurate reading with the  dynamap, and pt. unsymptomatic Stand to Sit: 5: Supervision Exercises   End of Session OT - End of Session Activity Tolerance: Patient tolerated treatment well Patient left: in bed;with call bell in reach General Behavior During Session: [facility] for tasks performed Cognition: Impaired   Alejandrina Raimer M 04/02/2011, 4:41 PM

## 2011-04-02 NOTE — Progress Notes (Signed)
04/02/2011 [facility], Bosie Clos SPARKS Case Management Note [phone]    CARE MANAGEMENT NOTE 04/02/2011  Patient:  [name], [name]   Account Number:  [id number]  Date Initiated:  04/02/2011  Documentation initiated by:  Fransico Michael  Subjective/Objective Assessment:   admitted on 03/30/11 with c/o weakness     Action/Plan:   Prior to admission, patient lived alone with support from adult children. Independent with ADLs.   Anticipated DC Date:  04/05/2011   Anticipated DC Plan:  HOME W HOME HEALTH SERVICES      DC Planning Services  CM consult      Choice offered to / List presented to:             Status of service:  In process, will continue to follow Medicare Important Message given?   (If response is "NO", the following Medicare IM given date fields will be blank) Date Medicare IM given:   Date Additional Medicare IM given:    Discharge Disposition:    Per UR Regulation:  Reviewed for med. necessity/level of care/duration of stay  Comments:  AVW:UJWJ Kumar  Contact:Robin Limberg (daughter) [phone]  04/02/11-1307-J.Harvir Patry,RN,BSN  [phone]      77yo female admitted 03/30/11 with c/o weakness. Patient has a significant cardiac history. Prior to admission, patient lived at home alone with support of adult children. Awaiting a PT eval. Anticipated plan will be to discharge home with home health care. CM will continue to monitor.

## 2011-04-02 NOTE — Progress Notes (Signed)
PCP: [name], MD  Brief narrative: 77 year-old female history of CAD status post CABG, history of atrial fibrillation status post AV ablation status post pacemaker placement, COPD, diabetes mellitus type 2, von Willebrand's disease came to the ER because of increasing weakness over the last week. Patient usually uses a walker to walk around. But she states that now is feeling profoundly weak that she is unable to walk even with the help of walker. In addition patient is having nausea but denies any vomiting or abdominal pain. In the ER patient had labs which are normal except for possible UTI. Patient needed help in the ER to be mobilized. Patient was admitted for similar complaints last month. And at that time stroke was ruled out.  Past medical history:  CAD status post CABG, history of atrial fibrillation status post AV ablation status post pacemaker placement, COPD, diabetes mellitus type 2, von Willebrand's disease  Consultants: None so far  Procedures: None so far   Subjective: Patient feels better. No nausea. Breathing is better. Cough is better. Weakness is better.  Objective: Vital signs in last 24 hours: Temp:  [97.5 F (36.4 C)-98.8 F (37.1 C)] 97.5 F (36.4 C) (12/06 1427) Pulse Rate:  [74-84] 74  (12/06 1427) Resp:  [18-20] 18  (12/06 1427) BP: (125-145)/(60-78) 125/60 mmHg (12/06 1427) SpO2:  [97 %-99 %] 99 % (12/06 1427) Weight:  [92.7 kg (204 lb 5.9 oz)] 204 lb 5.9 oz (92.7 kg) (12/06 0500) Weight change: 0.9 kg (1 lb 15.7 oz) Last BM Date: 03/31/11  Intake/Output from previous day: 12/05 0701 - 12/06 0700 In: 1512 [P.O.:780; I.V.:732] Out: 2650 [Urine:2650] Intake/Output this shift: Total I/O In: 120 [P.O.:120] Out: 550 [Urine:550]  General appearance: alert, cooperative, appears stated age and no distress Head: Normocephalic, without obvious abnormality, atraumatic Neck: no adenopathy, no carotid bruit, no JVD, supple, symmetrical, trachea midline and  thyroid not enlarged, symmetric, no tenderness/mass/nodules Resp: Improved air entry bilaterally. Scattered wheezing, improved. No crackles. Cardio: regular rate and rhythm, S1, S2 normal, no murmur, click, rub or gallop Extremities: extremities normal, atraumatic, no cyanosis or edema Neurologic: Grossly normal  Lab Results:  [facility] 04/01/11 0611 03/31/11 0655  WBC 5.3 6.2  HGB 10.9* 11.2*  HCT 35.7* 36.4  PLT 188 197   BMET  Basename 04/01/11 0611 03/31/11 0655  NA 139 132*  K 4.3 4.5  CL 101 93*  CO2 30 30  GLUCOSE 220* 391*  BUN 16 17  CREATININE 0.88 0.97  CALCIUM 8.8 8.8    Studies/Results: US Abdomen Complete  04/01/2011  *RADIOLOGY REPORT*  Clinical Data:  Nausea.  COMPLETE ABDOMINAL ULTRASOUND  Comparison:  10/19/2008  Findings:  Gallbladder:  No gallstones, gallbladder wall thickening, or pericholecystic fluid.  Common bile duct:   Within normal limits in caliber.  Liver:  No focal lesion identified.  Within normal limits in parenchymal echogenicity.  IVC:  Appears normal.  Pancreas:  Not well visualized due to overlying bowel gas.  Spleen:  Within normal limits in size and echotexture.  Right Kidney:   Cortical thinning.  10.0 cm.  No focal abnormality or hydronephrosis.  Left Kidney:  Cortical thinning.  11.7 cm.  No focal abnormality or hydronephrosis.  Abdominal aorta:  Visualized portions are nonaneurysmal, maximally 2.4 cm.  IMPRESSION: No acute findings in the abdomen.  Midline structures difficult to visualize due to overlying bowel gas.  Original Report Authenticated By: Cyndie Chime, M.D.    Medications:  Scheduled:    .  albuterol  2.5 mg Nebulization TID  . budesonide-formoterol  1 puff Inhalation BID  . cyanocobalamin  1,000 mcg Intramuscular Q30 days  . doxycycline  100 mg Oral Q12H  . insulin aspart  0-5 Units Subcutaneous QHS  . insulin aspart  0-9 Units Subcutaneous TID WC  . insulin glargine  14 Units Subcutaneous QHS  . insulin glargine  40  Units Subcutaneous Daily  . metoprolol  50 mg Oral Daily  . pantoprazole  40 mg Oral Daily  . potassium chloride  10 mEq Oral Daily  . predniSONE  40 mg Oral Q breakfast  . sertraline  100 mg Oral Daily  . sincalide  0.02 mcg/kg Intravenous Once  . warfarin  5 mg Oral ONCE-1800  . warfarin  5 mg Oral ONCE-1800  . DISCONTD: albuterol  2.5 mg Nebulization Q6H  . DISCONTD: insulin aspart  0-9 Units Subcutaneous TID WC  . DISCONTD: insulin glargine  10 Units Subcutaneous QHS  . DISCONTD: insulin glargine  32 Units Subcutaneous Daily    Principal Problem:  *Weakness generalized Active Problems:  HYPOTHYROIDISM  CORONARY ARTERY DISEASE  PAROXYSMAL ATRIAL FIBRILLATION  UTI (lower urinary tract infection)  Nausea  DM (diabetes mellitus), type 2, uncontrolled   Assessment/Plan: #1 generalized weakness: Improved. I believe this may have been due to a combination of her bronchitis, UTI, Hyperglycemia. PT/OT. Will need home health.  #2 urinary tract infection: Continue with doxycycline. Patient has multiple antibiotic allergies. Insignificant growth of e coli in urine.  #3 nausea: Improved. Ultrasound did not show any obvious abnormalities. HIDA scan report pending. Continue symptomatic treatment. Continue PPI.  #4 mild bronchitis: Improved with steroids.  #5 poorly controlled diabetes, type II: HbA1c is 14. Add meal coverage. Increase night dose. Patient reports blood sugars run 90-120 at home. With a A1C of 14 that is unlikely.  #6 mild anemia: Stable  Rest of her medical issues including hypothyroidism, coronary artery disease , and paroxysmal A. fib, are stable. Warfarin is being dosed by pharmacy.  DVT, prophylaxis with SCDs.  Anticipate discharge in AM.   LOS: 3 days   Jewelz Ricklefs 04/02/2011, 2:49 PM

## 2011-04-03 ENCOUNTER — Encounter: Payer: Self-pay | Admitting: *Deleted

## 2011-04-03 LAB — GLUCOSE, CAPILLARY: Glucose-Capillary: 201 mg/dL — ABNORMAL HIGH (ref 70–99)

## 2011-04-03 LAB — PROTIME-INR: INR: 1.91 — ABNORMAL HIGH (ref 0.00–1.49)

## 2011-04-03 MED ORDER — WARFARIN SODIUM 5 MG PO TABS
5.0000 mg | ORAL_TABLET | Freq: Once | ORAL | Status: DC
  Filled 2011-04-03: qty 1

## 2011-04-03 MED ORDER — INSULIN GLARGINE 100 UNIT/ML ~~LOC~~ SOLN: 22.0000 [IU] | Freq: Every day | SUBCUTANEOUS | Status: DC

## 2011-04-03 MED ORDER — DIPHENHYDRAMINE HCL 25 MG PO CAPS
25.0000 mg | ORAL_CAPSULE | Freq: Every evening | ORAL | Status: DC | PRN
  Administered 2011-04-03: 25 mg via ORAL
  Filled 2011-04-03: qty 1

## 2011-04-03 MED ORDER — INSULIN GLARGINE 100 UNIT/ML ~~LOC~~ SOLN: 45.0000 [IU] | SUBCUTANEOUS | Status: DC

## 2011-04-03 MED ORDER — DOXYCYCLINE HYCLATE 100 MG PO TABS: 100.0000 mg | ORAL_TABLET | Freq: Two times a day (BID) | ORAL | Status: AC

## 2011-04-03 MED ORDER — PREDNISONE 20 MG PO TABS: ORAL_TABLET | ORAL | Status: DC

## 2011-04-03 MED ORDER — INSULIN GLARGINE 100 UNIT/ML ~~LOC~~ SOLN: 20.0000 [IU] | Freq: Every day | SUBCUTANEOUS | Status: DC

## 2011-04-03 NOTE — Discharge Summary (Signed)
Physician Discharge Summary  Patient ID: [name] MRN: [id number] DOB/AGE: [birth year] 77 y.o.  Admit date: 03/30/2011 Discharge date: 04/03/2011  Admission Diagnoses:  Generalized Weakness  Discharge Diagnoses:  Principal Problem:  *Weakness generalized Active Problems:  HYPOTHYROIDISM  CORONARY ARTERY DISEASE  PAROXYSMAL ATRIAL FIBRILLATION  UTI (lower urinary tract infection)  Nausea  DM (diabetes mellitus), type 2, uncontrolled   Discharged Condition: fair  Initial History: 77 year-old female history of CAD status post CABG, history of atrial fibrillation status post AV ablation status post pacemaker placement, COPD, diabetes mellitus type 2, von Willebrand's disease came to the ER because of increasing weakness over the last week. Patient usually uses a walker to walk around. But she states that now is feeling profoundly weak that she is unable to walk even with the help of walker. In addition patient is having nausea but denies any vomiting or abdominal pain. In the ER patient had labs which are normal except for possible UTI. Patient needed help in the ER to be mobilized. Patient was admitted for similar complaints last month. And at that time stroke was ruled out.   Past medical history: CAD status post CABG, history of atrial fibrillation status post AV ablation status post pacemaker placement, COPD, diabetes mellitus type 2, von Willebrand's disease   Hospital Course:   #1 generalized weakness: I believe this may have been due to a combination of her bronchitis, UTI, Hyperglycemia. The patient has significantly improved. She has been seen by physical therapy and occupational therapy. She will require home health for the same.    #2 urinary tract infection: The UA was abnormal however, it wasn't entirely clear if the patient did have a UTI. Urine cultures are growing Escherichia coli. However, the colony count is insignificant. Patient had a normal blood count, as well  as she was afebrile. She has multiple antibiotic allergies and, so, she was placed on doxycycline. We will give her doxycycline to complete a course.   #3 nausea: Reason for the nausea was not entirely clear at the time of admission. It was thought to be secondary to the hyperglycemia and her bronchitis. To further work this, up we obtained ultrasound of the abdomen, which did not show any significant abnormalities in the gallbladder. HIDA scan was subsequently done. The results are mentioned below. I do not believe that she has significant gallbladder disease to warrant surgery at this time. She may followup with her primary care physician to further discuss this issue. In the, meantime, her nausea has resolved, and she is tolerating a diet quite well.  #4 mild bronchitis: This is also improved. She was given. Nebulizer treatments. We try to hold off on steroids initially, however, she did require steroids. We will do a quick taper to avoid the significant hyperglycemia.  #5 poorly controlled diabetes, type II: HbA1c is 14. Her diabetes is very poorly controlled. Her Lantus dose had to be increased multiple times during this hospitalization. There is some concern regarding patient's cognitive abilities. So, we will have a RN go visit her at home, to discuss diabetic management and teaching. With the HbA1c of 14 it is quite unlikely that her blood sugars have been controlled at home. With a quick taper of steroids the blood sugar should come down a little bit. And that is why am not increasing the Lantus significantly.  #6 mild anemia: This issue has been stable.  Rest of her medical issues including hypothyroidism, coronary artery disease , and paroxysmal A.  fib, are stable. Warfarin was being dosed by pharmacy.   The home health, R.N. we'll check a PT/INR on Monday, December 10.  Because of concern about cognitive impairment we'll also have the social worker visit her home to ensure that her home  environment is safe.   PERTINENT LABS HbA1c was 14.1. INR is 1.91 today. Hemoglobin 10.9.   IMAGING STUDIES US Abdomen Complete  04/01/2011  *RADIOLOGY REPORT*  Clinical Data:  Nausea.  COMPLETE ABDOMINAL ULTRASOUND  Comparison:  10/19/2008  Findings:  Gallbladder:  No gallstones, gallbladder wall thickening, or pericholecystic fluid.  Common bile duct:   Within normal limits in caliber.  Liver:  No focal lesion identified.  Within normal limits in parenchymal echogenicity.  IVC:  Appears normal.  Pancreas:  Not well visualized due to overlying bowel gas.  Spleen:  Within normal limits in size and echotexture.  Right Kidney:   Cortical thinning.  10.0 cm.  No focal abnormality or hydronephrosis.  Left Kidney:  Cortical thinning.  11.7 cm.  No focal abnormality or hydronephrosis.  Abdominal aorta:  Visualized portions are nonaneurysmal, maximally 2.4 cm.  IMPRESSION: No acute findings in the abdomen.  Midline structures difficult to visualize due to overlying bowel gas.  Original Report Authenticated By: Cyndie Chime, M.D.   Nm Hepato W/eject Fract  04/02/2011  *RADIOLOGY REPORT*  Clinical Data: Persistent nausea.  NUCLEAR MEDICINE HEPATOBILIARY WITH GB, PHARM AND QUAN MEASURE  Radiopharmaceutical: 5.0 mCi technetium Choletec IV.  1.9 mcg CCK IV.  The patient had no pain during CCK administration.  Comparison: Ultrasound dated 04/01/2011  Findings: The bile ducts are visible at 15 minutes.  Gallbladder is visible 45 minutes.  Ejection fraction was calculated after CCK infusion.  Ejection fraction was 27.3%.  This is slightly under normal limits of 30%.  IMPRESSION: Normal initial phases of the hepatobiliary scan.  However, the patient has a slightly decreased ejection fraction of 27.3%.  Lower limits of normal is 30%.  Original Report Authenticated By: Gwynn Burly, M.D.   Dg Chest 2vsame Day  03/31/2011  *RADIOLOGY REPORT*  Clinical Data: Shortness of breath and productive cough.  CHEST - 2 VIEW  SAME DAY  Comparison: Chest radiograph performed 02/18/2011  Findings: The lungs are well-aerated.  Mild right basilar opacity likely reflects chronic scarring, on correlation with multiple prior studies.  There is no evidence of pleural effusion or pneumothorax.  The heart is mildly enlarged; the patient is status with mid sternotomy, with evidence of prior CABG.  A pacemaker is noted at the left chest wall, with two leads ending along the right ventricle.  Calcification is noted along the thoracic aorta.  No acute osseous abnormalities are seen.  IMPRESSION:  1.  Mild right basilar airspace opacity likely reflects chronic scarring, on correlation with multiple prior studies.  No definite acute airspace consolidation seen. 2.  Mild cardiomegaly noted. 3.  Calcification along the thoracic aorta.  Original Report Authenticated By: Tonia Ghent, M.D.    Discharge Exam: Blood pressure 136/86, pulse 74, temperature 97.7 F (36.5 C), temperature source Oral, resp. rate 20, height 5\' 6"  (1.676 m), weight 92.8 kg (204 lb 9.4 oz), SpO2 96.00%. General appearance: alert, cooperative and no distress Resp: clear to auscultation bilaterally Cardio: regular rate and rhythm, S1, S2 normal, no murmur, click, rub or gallop  Disposition: Home-Health Care Svc  Discharge Orders    Future Orders Please Complete By Expires   Diet Carb Modified      Increase activity  slowly      Discharge instructions      Comments:   Be sure to follow up with your doctor. Check your blood sugars 3 times a day before meals A nurse will come home to check your PT/INR ('Coumadin level') on 04/06/11. Then you may resume going to the coumadin clinic as before. Home health has been arranged for you for physical therapy as well.     Current Discharge Medication List    START taking these medications   Details  doxycycline (VIBRA-TABS) 100 MG tablet Take 1 tablet (100 mg total) by mouth every 12 (twelve) hours. Qty: 10 tablet,  Refills: 0    predniSONE (DELTASONE) 20 MG tablet 2 tablets daily for 2 days, then 1 tab daily for 4 days, then stop. Qty: 8 tablet, Refills: 0      CONTINUE these medications which have CHANGED   Details  !! insulin glargine (LANTUS) 100 UNIT/ML injection Inject 45 Units into the skin every morning. Qty: 10 mL    !! insulin glargine (LANTUS) 100 UNIT/ML injection Inject 20 Units into the skin at bedtime. Qty: 10 mL     !! - Potential duplicate medications found. Please discuss with provider.    CONTINUE these medications which have NOT CHANGED   Details  cyanocobalamin (COBAL-1000) 1000 MCG/ML injection Inject 1,000 mcg into the muscle. Take twice a month.     furosemide (LASIX) 40 MG tablet Take 40 mg by mouth daily.      insulin lispro (HUMALOG KWIKPEN) 100 UNIT/ML injection Inject 10-12 Units into the skin 3 (three) times daily as needed. Give 10 units three times a day if blood sugar less than 225. Give 12 units three times a day if blood sugar 225 or higher.    levalbuterol (XOPENEX HFA) 45 MCG/ACT inhaler Inhale 1-2 puffs into the lungs 4 (four) times daily.      metoprolol (TOPROL-XL) 100 MG 24 hr tablet Take 1/2 tablet daily Qty: 30 tablet, Refills: 6    nitroGLYCERIN (NITROSTAT) 0.4 MG SL tablet Place 0.4 mg under the tongue every 5 (five) minutes as needed. For chest pain.    pantoprazole (PROTONIX) 40 MG tablet Take 40 mg by mouth daily.      potassium chloride (K-DUR,KLOR-CON) 10 MEQ tablet Take 10 mEq by mouth daily.      sertraline (ZOLOFT) 100 MG tablet Take 100 mg by mouth daily.      !! warfarin (COUMADIN) 5 MG tablet Take 5 mg by mouth daily. Takes 5mg  on Tuesday, Thursday, Saturday, Sunday and 7.5mg  other days of the week.     !! warfarin (COUMADIN) 7.5 MG tablet Take 7.5 mg by mouth every Monday, Wednesday, and Friday at 6 PM. Takes 7.5mg  on M-W-F and 5mg  other days of the week.  Last dose was 7.5mg  on Monday 03-30-11     budesonide-formoterol (SYMBICORT)  160-4.5 MCG/ACT inhaler Inhale 1 puff into the lungs 2 (two) times daily.       !! - Potential duplicate medications found. Please discuss with provider.     Follow-up Information    Follow up with [facility]. Call in 5 days.      Follow up with [facility]. Call in 4 days.         Total Discharge Time: 45 mins  Signed: Abigail [facility] Pager [phone]  04/03/2011, 1:33 PM

## 2011-04-03 NOTE — Progress Notes (Signed)
04/03/2011 [facility], Bosie Clos SPARKS Case Management Note [phone]    CARE MANAGEMENT NOTE 04/03/2011  Patient:  [name], [name]   Account Number:  [id number]  Date Initiated:  04/02/2011  Documentation initiated by:  Fransico Michael  Subjective/Objective Assessment:   admitted on 03/30/11 with c/o weakness     Action/Plan:   Prior to admission, patient lived alone with support from adult children. Independent with ADLs.   Anticipated DC Date:  04/05/2011   Anticipated DC Plan:  HOME W HOME HEALTH SERVICES      DC Planning Services  CM consult      [facility] Choice  HOME HEALTH   Choice offered to / List presented to:  C-1 Patient        HH arranged  HH-1 RN  HH-2 PT  HH-3 OT  HH-6 SOCIAL WORKER      HH agency  [facility]   Status of service:  Completed, signed off Medicare Important Message given?   (If response is "NO", the following Medicare IM given date fields will be blank) Date Medicare IM given:   Date Additional Medicare IM given:    Discharge Disposition:  HOME W HOME HEALTH SERVICES  Per UR Regulation:  Reviewed for med. necessity/level of care/duration of stay  Comments:  BJY:NWGN Kumar  Contact:[name] (daughter) [phone]  04/03/11-1147-[name] Hangartner,RN,BSN [phone]      In to speak to patient regarding orders for home health. Choice of agencies given to patient. [facility] for [facility] needs. Debbie, RN for [facility] notified of choice and need for services. Referral placed into [facility]. No further discharge needs noted.  04/02/11-1307-J.Kazuki Ingle,RN,BSN  [phone]      77yo female admitted 03/30/11 with c/o weakness. Patient has a significant cardiac history. Prior to admission, patient lived at home alone with support of adult children. Awaiting a PT eval. Anticipated plan will be to discharge home with home health care. CM will continue to monitor.

## 2011-04-03 NOTE — Progress Notes (Signed)
Physical Therapy Treatment Patient Details Name: [name] MRN: [id number] DOB: June 30, 1924 Today's Date: 04/03/2011  PT Assessment/Plan  PT - Assessment/Plan Comments on Treatment Session: Pt had no c/o dizziness today, doing well. Pt had one LOB laterally requiring assistance, pt advised to have husband/family walk with her initially at home. Pt does however appear to have some underlying memory deficits. Today pt attempted to walk into another person's room thinking it was hers, did not think she had her call bell although PT had just handed it to her, and has some generalized decreased processing. OT spoke with PT yesterday about pt reporting some significant differences with history prior to admission.  All of this is not overly alarming especially given age however wondering if this is the start of some memory decline?  PT Plan: Discharge plan remains appropriate PT Frequency: Min 3X/week Follow Up Recommendations: Home health PT Equipment Recommended: None recommended by PT PT Goals  Acute Rehab PT Goals PT Goal Formulation: With patient Time For Goal Achievement: 2 weeks Pt will go Supine/Side to Sit: Independently PT Goal: Supine/Side to Sit - Progress: Progressing toward goal Pt will go Sit to Stand: with supervision PT Goal: Sit to Stand - Progress: Met Pt will Ambulate: >150 feet;with supervision;with rolling walker PT Goal: Ambulate - Progress: Progressing toward goal Pt will Go Up / Down Stairs: 3-5 stairs;with min assist;with least restrictive assistive device PT Goal: Up/Down Stairs - Progress: Progressing toward goal  PT Treatment Precautions/Restrictions  Precautions Precautions: Fall Precaution Comments: Pt. denies dizziness today.  Dynamap would not read BP, but pt asymptomatic Restrictions Weight Bearing Restrictions: No Mobility (including Balance) Bed Mobility Bed Mobility: Yes Supine to Sit: 5: Supervision Supine to Sit Details (indicate cue type and  reason): for safety Sitting - Scoot to Edge of Bed: 7: Independent Transfers Transfers: Yes Sit to Stand: 6: Modified independent (Device/Increase time) Sit to Stand Details (indicate cue type and reason): Pt denies dizziness. Verbal cues to obtain balance prior to walking.  Stand to Sit: 5: Supervision Stand to Sit Details: for safety secondary tight negotiation space.  Ambulation/Gait Ambulation/Gait: Yes Ambulation/Gait Assistance: 5: Supervision;4: Min assist Ambulation/Gait Assistance Details (indicate cue type and reason): Pt with one LOB laterally (with RW tipping to Rt.) requiring min assist to correct, otherwise supervision. Verbal cues for breathing.  Ambulation Distance (Feet): 170 Feet Assistive device: Rolling walker Gait Pattern: Decreased stride length;Trunk flexed Stairs: No    Exercise  General Exercises - Lower Extremity Ankle Circles/Pumps: AROM;Both;20 reps;Seated End of Session PT - End of Session Equipment Utilized During Treatment: Gait belt Activity Tolerance: Patient tolerated treatment well (Limited by dizziness) Patient left: in chair;with call bell in reach General Behavior During Session: [facility] for tasks performed Cognition: Impaired Cognitive Impairment: Pt with some memory deficits. See Assessment  Sherie Don 04/03/2011, 12:20 PM  Sherie Don) Carleene Mains PT, DPT Acute Rehabilitation [phone]

## 2011-04-03 NOTE — Progress Notes (Signed)
ANTICOAGULATION CONSULT NOTE - Follow Up Consult  Pharmacy Consult for Coumadin Indication: atrial fibrillation  Allergies  Allergen Reactions  . Carbamazepine Diarrhea  . Cefaclor     REACTION: unspecified  . Cephalexin Hives  . Clarithromycin Hives  . Dicyclomine Diarrhea and Nausea And Vomiting  . Dicyclomine Hcl     REACTION: unspecified  . Sulfonamide Derivatives Hives  . ZOX:WRUEAVWUJWJ+XBJYNWGNF+AOZHYQMVHQ Acid+Aspartame Rash  . Sulfamethoxazole Rash    REACTION: unspecified    Patient Measurements: Height: 5\' 6"  (167.6 cm) Weight: 204 lb 9.4 oz (92.8 kg) IBW/kg (Calculated) : 59.3    Vital Signs: Temp: 97.7 F (36.5 C) (12/07 0635) Temp src: Oral (12/07 0635) BP: 136/86 mmHg (12/07 0635) Pulse Rate: 74  (12/07 0635)  Labs:  Alvira Philips 04/03/11 4696 04/02/11 0617 04/01/11 0611  HGB -- -- 10.9*  HCT -- -- 35.7*  PLT -- -- 188  APTT -- -- --  LABPROT 22.2* 21.3* 18.1*  INR 1.91* 1.81* 1.47  HEPARINUNFRC -- -- --  CREATININE -- -- 0.88  CKTOTAL -- -- --  CKMB -- -- --  TROPONINI -- -- --   Estimated Creatinine Clearance: 54.6 ml/min (by C-G formula based on Cr of 0.88).   Medications:  Scheduled:    . albuterol  2.5 mg Nebulization TID  . budesonide-formoterol  1 puff Inhalation BID  . cyanocobalamin  1,000 mcg Intramuscular Q30 days  . doxycycline  100 mg Oral Q12H  . insulin aspart  0-5 Units Subcutaneous QHS  . insulin aspart  0-9 Units Subcutaneous TID WC  . insulin aspart  8 Units Subcutaneous TID WC  . insulin aspart  8 Units Subcutaneous Once  . insulin glargine  18 Units Subcutaneous QHS  . insulin glargine  40 Units Subcutaneous Daily  . metoprolol  50 mg Oral Daily  . pantoprazole  40 mg Oral Daily  . potassium chloride  10 mEq Oral Daily  . predniSONE  40 mg Oral Q breakfast  . sertraline  100 mg Oral Daily  . sincalide  0.02 mcg/kg Intravenous Once  . warfarin  5 mg Oral ONCE-1800  . DISCONTD: insulin glargine  14 Units  Subcutaneous QHS   Infusions:    . DISCONTD: sodium chloride 50 mL/hr at 04/01/11 1618    Anticoagulants    warfarin Tabs(mg)     7.5 5 5          Labs    PT           INR     1.07 1.47 1.81 1.91   aPTT           Heparin Unfractionated           Platelets    241 197 188     HCT    38.6 36.4 35.7     Creatinine    0.94 0.97 0.88     Heparin Induced Plt Ab            Assessment: 86 YOF on coumadin for afib. INR 1.91. Goal of Therapy:  INR 2 - 3   Plan:  Coumadin 5mg  today.  Samyia Motter, Elisha Headland, Pharm.D. 04/03/2011 10:41 AM

## 2011-04-03 NOTE — Progress Notes (Signed)
04/03/2011 Fransico Michael SPARKS Case Management Note [phone]  MEDICARE-CERTIFED HOME HEALTH AGENCIES [facility]   Agencies that are Medicare-Certified and affiliated with [facility]  Home Health Agency  Telephone Number Address  [facility]    [facility] has ownership interest  in this company; however, you are under no obligation to use this agency. [phone]  [address] [place], [place] [address]   Agencies that are Medicare-Certified and are not affiliated with [facility]  Telephone Number Address  [facility] [phone] Fax [phone] [address] [place], [place]  [address]  [facility] [phone] [address] [place], [place]  [address]  [facility] [phone] Fax [phone] [address] [place], [place]  [address]  [facility] [phone] Fax [phone] [address] [place], [place] [address]  [facility] [phone] or  [phone] [address] [place], [place] [address]  [facility] [phone] Fax [phone] [address] [place], [place] [address]  [facility] [phone]  [address] [place], [place] [address]  [facility] [phone] or  [phone] [address] [place], [place] [address]   [facility]  [phone] [address] [place], [place]  [address]  [facility] [phone] Fax [phone] [address] [place], [place]  [address]       Agencies that are NOT Medicare-Certified and are not affiliated with [facility]  Telephone Number Address  [facility] [place] [phone] or [phone] Fax [phone] [address] [place]  [address]  [facility] [phone] Fax [phone] [address] [place], [place]  [address]  [facility] [phone] Fax [phone] [address] [place], [place]  [address]  [facility]  [phone] [address] [place], [place]  [facility] [phone] or [phone] [address] [place], [place]  [address]  [facility] [phone] Fax [phone] [address] [place], [place]  [address]  [facility] [phone] Fax [phone] [address] [place], [place]  [address]   Choice of agencies given to patient at 1147 this am.

## 2011-04-06 ENCOUNTER — Encounter: Payer: Self-pay | Admitting: Cardiovascular Disease

## 2011-04-22 ENCOUNTER — Encounter: Payer: Self-pay | Admitting: Cardiology

## 2011-05-13 DIAGNOSIS — I2510 Atherosclerotic heart disease of native coronary artery without angina pectoris: Secondary | ICD-10-CM | POA: Diagnosis not present

## 2011-05-13 DIAGNOSIS — J449 Chronic obstructive pulmonary disease, unspecified: Secondary | ICD-10-CM | POA: Diagnosis not present

## 2011-05-13 DIAGNOSIS — E039 Hypothyroidism, unspecified: Secondary | ICD-10-CM | POA: Diagnosis not present

## 2011-05-13 DIAGNOSIS — R404 Transient alteration of awareness: Secondary | ICD-10-CM | POA: Diagnosis not present

## 2011-05-13 DIAGNOSIS — I10 Essential (primary) hypertension: Secondary | ICD-10-CM | POA: Diagnosis not present

## 2011-05-13 DIAGNOSIS — R197 Diarrhea, unspecified: Secondary | ICD-10-CM | POA: Diagnosis not present

## 2011-05-13 DIAGNOSIS — R05 Cough: Secondary | ICD-10-CM | POA: Diagnosis not present

## 2011-05-13 DIAGNOSIS — R112 Nausea with vomiting, unspecified: Secondary | ICD-10-CM | POA: Diagnosis not present

## 2011-05-13 DIAGNOSIS — R059 Cough, unspecified: Secondary | ICD-10-CM | POA: Diagnosis not present

## 2011-05-13 DIAGNOSIS — Z9981 Dependence on supplemental oxygen: Secondary | ICD-10-CM | POA: Diagnosis not present

## 2011-05-13 DIAGNOSIS — R5381 Other malaise: Secondary | ICD-10-CM | POA: Diagnosis not present

## 2011-05-14 DIAGNOSIS — E039 Hypothyroidism, unspecified: Secondary | ICD-10-CM | POA: Diagnosis present

## 2011-05-14 DIAGNOSIS — I2510 Atherosclerotic heart disease of native coronary artery without angina pectoris: Secondary | ICD-10-CM | POA: Diagnosis present

## 2011-05-14 DIAGNOSIS — Z7901 Long term (current) use of anticoagulants: Secondary | ICD-10-CM | POA: Diagnosis not present

## 2011-05-14 DIAGNOSIS — F0390 Unspecified dementia without behavioral disturbance: Secondary | ICD-10-CM | POA: Diagnosis present

## 2011-05-14 DIAGNOSIS — R112 Nausea with vomiting, unspecified: Secondary | ICD-10-CM | POA: Diagnosis not present

## 2011-05-14 DIAGNOSIS — J4489 Other specified chronic obstructive pulmonary disease: Secondary | ICD-10-CM | POA: Diagnosis not present

## 2011-05-14 DIAGNOSIS — R05 Cough: Secondary | ICD-10-CM | POA: Diagnosis not present

## 2011-05-14 DIAGNOSIS — R197 Diarrhea, unspecified: Secondary | ICD-10-CM | POA: Diagnosis not present

## 2011-05-14 DIAGNOSIS — Z951 Presence of aortocoronary bypass graft: Secondary | ICD-10-CM | POA: Diagnosis not present

## 2011-05-14 DIAGNOSIS — I10 Essential (primary) hypertension: Secondary | ICD-10-CM | POA: Diagnosis not present

## 2011-05-14 DIAGNOSIS — J449 Chronic obstructive pulmonary disease, unspecified: Secondary | ICD-10-CM | POA: Diagnosis not present

## 2011-05-14 DIAGNOSIS — IMO0001 Reserved for inherently not codable concepts without codable children: Secondary | ICD-10-CM | POA: Diagnosis not present

## 2011-05-14 DIAGNOSIS — E785 Hyperlipidemia, unspecified: Secondary | ICD-10-CM | POA: Diagnosis present

## 2011-05-14 DIAGNOSIS — Z950 Presence of cardiac pacemaker: Secondary | ICD-10-CM | POA: Diagnosis not present

## 2011-05-14 DIAGNOSIS — Z9981 Dependence on supplemental oxygen: Secondary | ICD-10-CM | POA: Diagnosis not present

## 2011-05-23 DIAGNOSIS — Z7901 Long term (current) use of anticoagulants: Secondary | ICD-10-CM | POA: Diagnosis not present

## 2011-05-23 DIAGNOSIS — I509 Heart failure, unspecified: Secondary | ICD-10-CM | POA: Diagnosis not present

## 2011-05-23 DIAGNOSIS — I4891 Unspecified atrial fibrillation: Secondary | ICD-10-CM | POA: Diagnosis not present

## 2011-05-23 DIAGNOSIS — I252 Old myocardial infarction: Secondary | ICD-10-CM | POA: Diagnosis not present

## 2011-05-23 DIAGNOSIS — J441 Chronic obstructive pulmonary disease with (acute) exacerbation: Secondary | ICD-10-CM | POA: Diagnosis not present

## 2011-05-23 DIAGNOSIS — E785 Hyperlipidemia, unspecified: Secondary | ICD-10-CM | POA: Diagnosis not present

## 2011-05-23 DIAGNOSIS — R05 Cough: Secondary | ICD-10-CM | POA: Diagnosis not present

## 2011-05-23 DIAGNOSIS — E119 Type 2 diabetes mellitus without complications: Secondary | ICD-10-CM | POA: Diagnosis not present

## 2011-05-23 DIAGNOSIS — I10 Essential (primary) hypertension: Secondary | ICD-10-CM | POA: Diagnosis not present

## 2011-05-23 DIAGNOSIS — R110 Nausea: Secondary | ICD-10-CM | POA: Diagnosis not present

## 2011-05-23 DIAGNOSIS — Z951 Presence of aortocoronary bypass graft: Secondary | ICD-10-CM | POA: Diagnosis not present

## 2011-05-23 DIAGNOSIS — F068 Other specified mental disorders due to known physiological condition: Secondary | ICD-10-CM | POA: Diagnosis not present

## 2011-05-23 DIAGNOSIS — Z79899 Other long term (current) drug therapy: Secondary | ICD-10-CM | POA: Diagnosis not present

## 2011-05-23 DIAGNOSIS — R0602 Shortness of breath: Secondary | ICD-10-CM | POA: Diagnosis not present

## 2011-05-23 DIAGNOSIS — R062 Wheezing: Secondary | ICD-10-CM | POA: Diagnosis not present

## 2011-05-25 ENCOUNTER — Emergency Department (HOSPITAL_COMMUNITY)
Admission: EM | Admit: 2011-05-25 | Discharge: 2011-05-26 | Disposition: A | Discharge status: Home / Self Care | Payer: Medicare Other | Attending: Emergency Medicine | Admitting: Emergency Medicine

## 2011-05-25 ENCOUNTER — Emergency Department (HOSPITAL_COMMUNITY): Payer: Medicare Other

## 2011-05-25 ENCOUNTER — Other Ambulatory Visit: Payer: Self-pay

## 2011-05-25 ENCOUNTER — Encounter (HOSPITAL_COMMUNITY): Payer: Self-pay

## 2011-05-25 DIAGNOSIS — J441 Chronic obstructive pulmonary disease with (acute) exacerbation: Secondary | ICD-10-CM

## 2011-05-25 DIAGNOSIS — D6800 Von Willebrand disease, unspecified: Secondary | ICD-10-CM | POA: Insufficient documentation

## 2011-05-25 DIAGNOSIS — E119 Type 2 diabetes mellitus without complications: Secondary | ICD-10-CM | POA: Insufficient documentation

## 2011-05-25 DIAGNOSIS — R062 Wheezing: Secondary | ICD-10-CM | POA: Insufficient documentation

## 2011-05-25 DIAGNOSIS — E039 Hypothyroidism, unspecified: Secondary | ICD-10-CM | POA: Diagnosis not present

## 2011-05-25 DIAGNOSIS — I2510 Atherosclerotic heart disease of native coronary artery without angina pectoris: Secondary | ICD-10-CM | POA: Insufficient documentation

## 2011-05-25 DIAGNOSIS — Z794 Long term (current) use of insulin: Secondary | ICD-10-CM | POA: Diagnosis not present

## 2011-05-25 DIAGNOSIS — R0602 Shortness of breath: Secondary | ICD-10-CM | POA: Insufficient documentation

## 2011-05-25 DIAGNOSIS — E8779 Other fluid overload: Secondary | ICD-10-CM | POA: Diagnosis not present

## 2011-05-25 DIAGNOSIS — Z79899 Other long term (current) drug therapy: Secondary | ICD-10-CM | POA: Diagnosis not present

## 2011-05-25 DIAGNOSIS — R059 Cough, unspecified: Secondary | ICD-10-CM | POA: Insufficient documentation

## 2011-05-25 DIAGNOSIS — I4891 Unspecified atrial fibrillation: Secondary | ICD-10-CM | POA: Diagnosis not present

## 2011-05-25 DIAGNOSIS — E1169 Type 2 diabetes mellitus with other specified complication: Secondary | ICD-10-CM | POA: Diagnosis not present

## 2011-05-25 DIAGNOSIS — R918 Other nonspecific abnormal finding of lung field: Secondary | ICD-10-CM | POA: Diagnosis not present

## 2011-05-25 DIAGNOSIS — H538 Other visual disturbances: Secondary | ICD-10-CM | POA: Diagnosis not present

## 2011-05-25 DIAGNOSIS — R05 Cough: Secondary | ICD-10-CM | POA: Insufficient documentation

## 2011-05-25 DIAGNOSIS — J8410 Pulmonary fibrosis, unspecified: Secondary | ICD-10-CM | POA: Diagnosis not present

## 2011-05-25 DIAGNOSIS — I509 Heart failure, unspecified: Secondary | ICD-10-CM | POA: Insufficient documentation

## 2011-05-25 DIAGNOSIS — R609 Edema, unspecified: Secondary | ICD-10-CM

## 2011-05-25 DIAGNOSIS — F0390 Unspecified dementia without behavioral disturbance: Secondary | ICD-10-CM | POA: Diagnosis not present

## 2011-05-25 LAB — BASIC METABOLIC PANEL
CO2: 33 mEq/L — ABNORMAL HIGH (ref 19–32)
Calcium: 8.7 mg/dL (ref 8.4–10.5)
Potassium: 3 mEq/L — ABNORMAL LOW (ref 3.5–5.1)
Sodium: 140 mEq/L (ref 135–145)

## 2011-05-25 LAB — CBC
HCT: 32.3 % — ABNORMAL LOW (ref 36.0–46.0)
Hemoglobin: 10.2 g/dL — ABNORMAL LOW (ref 12.0–15.0)
MCH: 24.3 pg — ABNORMAL LOW (ref 26.0–34.0)
MCV: 77.1 fL — ABNORMAL LOW (ref 78.0–100.0)
RBC: 4.19 MIL/uL (ref 3.87–5.11)

## 2011-05-25 LAB — PRO B NATRIURETIC PEPTIDE: Pro B Natriuretic peptide (BNP): 2481 pg/mL — ABNORMAL HIGH (ref 0–450)

## 2011-05-25 LAB — PROTIME-INR: Prothrombin Time: 36.7 seconds — ABNORMAL HIGH (ref 11.6–15.2)

## 2011-05-25 MED ORDER — DOXYCYCLINE HYCLATE 100 MG PO CAPS: 100.0000 mg | ORAL_CAPSULE | Freq: Two times a day (BID) | ORAL | Status: AC

## 2011-05-25 MED ORDER — FUROSEMIDE 10 MG/ML IJ SOLN
40.0000 mg | Freq: Once | INTRAMUSCULAR | Status: AC
  Administered 2011-05-25: 40 mg via INTRAVENOUS
  Filled 2011-05-25: qty 4

## 2011-05-25 MED ORDER — METHYLPREDNISOLONE SODIUM SUCC 125 MG IJ SOLR
125.0000 mg | Freq: Once | INTRAMUSCULAR | Status: AC
  Administered 2011-05-25: 125 mg via INTRAVENOUS
  Filled 2011-05-25: qty 2

## 2011-05-25 MED ORDER — PREDNISONE 20 MG PO TABS: ORAL_TABLET | ORAL | Status: AC

## 2011-05-25 MED ORDER — ALBUTEROL SULFATE (5 MG/ML) 0.5% IN NEBU
2.5000 mg | INHALATION_SOLUTION | Freq: Once | RESPIRATORY_TRACT | Status: AC
  Administered 2011-05-25: 2.5 mg via RESPIRATORY_TRACT
  Filled 2011-05-25: qty 0.5

## 2011-05-25 MED ORDER — IPRATROPIUM BROMIDE 0.02 % IN SOLN
0.5000 mg | Freq: Once | RESPIRATORY_TRACT | Status: AC
  Administered 2011-05-25: 0.5 mg via RESPIRATORY_TRACT
  Filled 2011-05-25: qty 2.5

## 2011-05-25 MED ORDER — ALBUTEROL SULFATE HFA 108 (90 BASE) MCG/ACT IN AERS: 2.0000 | INHALATION_SPRAY | RESPIRATORY_TRACT | Status: AC | PRN

## 2011-05-25 MED ORDER — DOXYCYCLINE HYCLATE 100 MG PO TABS
100.0000 mg | ORAL_TABLET | Freq: Once | ORAL | Status: AC
  Administered 2011-05-25: 100 mg via ORAL
  Filled 2011-05-25: qty 1

## 2011-05-25 NOTE — ED Provider Notes (Signed)
History     CSN: [id number]  Arrival date & time 05/25/11  2039   First MD Initiated Contact with Patient 05/25/11 2055      Chief Complaint  Patient presents with  . Shortness of Breath  . Cough    (Consider location/radiation/quality/duration/timing/severity/associated sxs/prior treatment) Patient is a 78 y.o. female presenting with shortness of breath and cough. The history is provided by the patient.  Shortness of Breath  The current episode started yesterday. The problem occurs continuously. The problem has been unchanged. The problem is mild. The symptoms are relieved by nothing. The symptoms are aggravated by nothing. Associated symptoms include cough, shortness of breath (mild but not worse than baseline) and wheezing. Pertinent negatives include no chest pain, no chest pressure, no orthopnea, no fever, no rhinorrhea, no sore throat and no stridor. She has had prior hospitalizations. Her past medical history is significant for past wheezing. She has been behaving normally. There were no sick contacts. She has received no recent medical care.  Cough Associated symptoms include shortness of breath (mild but not worse than baseline) and wheezing. Pertinent negatives include no chest pain, no chills, no headaches, no rhinorrhea, no sore throat and no eye redness.    Past Medical History  Diagnosis Date  . CHF (congestive heart failure)   . Cardiac pacemaker in situ   . Anemia, pernicious   . Atrial fibrillation     S/P AV node ablation  . Von Willebrand's disease   . Gallbladder disease   . Hypothyroidism   . Diabetes mellitus   . CAD (coronary artery disease)   . Acute asthmatic bronchitis   . COPD (chronic obstructive pulmonary disease)   . Shortness of breath     Past Surgical History  Procedure Date  . Coronary artery bypass graft   . Mandibular reconstruction w/ iliac bone graft   . Appendectomy   . Hernia repair     History reviewed. No pertinent family  history.  History  Substance Use Topics  . Smoking status: Former Smoker    Quit date: 03/29/1989  . Smokeless tobacco: Not on file  . Alcohol Use: No    OB History    Grav Para Term Preterm Abortions TAB SAB Ect Mult Living                  Review of Systems  Constitutional: Negative for fever, chills, activity change, appetite change and fatigue.  HENT: Negative for congestion, sore throat, rhinorrhea, neck pain and neck stiffness.   Eyes: Negative for photophobia, redness and visual disturbance.  Respiratory: Positive for cough, shortness of breath (mild but not worse than baseline) and wheezing. Negative for stridor.   Cardiovascular: Negative for chest pain, palpitations, orthopnea and leg swelling.  Gastrointestinal: Negative for nausea, vomiting, abdominal pain, diarrhea, constipation and blood in stool.  Genitourinary: Negative for dysuria, urgency, hematuria and flank pain.  Musculoskeletal: Negative for back pain.  Skin: Negative for rash and wound.  Neurological: Negative for dizziness, seizures, facial asymmetry, speech difficulty, weakness, light-headedness, numbness and headaches.  Psychiatric/Behavioral: Negative for confusion.  All other systems reviewed and are negative.    Allergies  Carbamazepine; Cefaclor; Cephalexin; Clarithromycin; Dicyclomine; Dicyclomine hcl; Sulfonamide derivatives; NWG:NFAOZHYQMVH+QIONGEXBM+WUXLKGMWNU acid+aspartame; and Sulfamethoxazole  Home Medications   Current Outpatient Rx  Name Route Sig Dispense Refill  . BUDESONIDE-FORMOTEROL FUMARATE 160-4.5 MCG/ACT IN AERO Inhalation Inhale 1 puff into the lungs 2 (two) times daily.      . CYANOCOBALAMIN 1000 MCG/ML IJ SOLN  Intramuscular Inject 1,000 mcg into the muscle. Take twice a month.     Marland Kitchen DILTIAZEM HCL ER 120 MG PO CP24 Oral Take 120 mg by mouth daily.    . FUROSEMIDE 40 MG PO TABS Oral Take 40 mg by mouth daily.      . INSULIN GLARGINE 100 UNIT/ML [place] SOLN Subcutaneous Inject 20  Units into the skin at bedtime. 10 mL   . INSULIN GLARGINE 100 UNIT/ML [place] SOLN Subcutaneous Inject 45 Units into the skin every morning.    . INSULIN LISPRO (HUMAN) 100 UNIT/ML  SOLN Subcutaneous Inject 10-12 Units into the skin 3 (three) times daily as needed. Give 10 units three times a day if blood sugar less than 225. Give 12 units three times a day if blood sugar 225 or higher.    Marland Kitchen LEVALBUTEROL TARTRATE 45 MCG/ACT IN AERO Inhalation Inhale 1-2 puffs into the lungs 4 (four) times daily.      Marland Kitchen LEVOTHYROXINE SODIUM 200 MCG PO TABS Oral Take 200 mcg by mouth daily.    Marland Kitchen METOPROLOL TARTRATE 50 MG PO TABS Oral Take 50 mg by mouth 2 (two) times daily.    Marland Kitchen NITROGLYCERIN 0.4 MG SL SUBL Sublingual Place 0.4 mg under the tongue every 5 (five) minutes as needed. For chest pain.    Marland Kitchen PANTOPRAZOLE SODIUM 40 MG PO TBEC Oral Take 40 mg by mouth daily.      Marland Kitchen POTASSIUM CHLORIDE CRYS ER 10 MEQ PO TBCR Oral Take 10 mEq by mouth daily.      . SERTRALINE HCL 100 MG PO TABS Oral Take 100 mg by mouth daily.      . WARFARIN SODIUM 5 MG PO TABS Oral Take 5 mg by mouth daily. Takes 5mg  on Tuesday, Thursday, Saturday, Sunday and 7.5mg  other days of the week.       BP 138/61  Pulse 73  Temp(Src) 98.9 F (37.2 C) (Oral)  Resp 21  Ht 5' 5.5" (1.664 m)  Wt 200 lb (90.719 kg)  BMI 32.78 kg/m2  SpO2 100%  Physical Exam  Nursing note and vitals reviewed. Constitutional: She is oriented to person, place, and time. She appears well-developed and well-nourished.  Non-toxic appearance. No distress.  HENT:  Head: Normocephalic and atraumatic.  Mouth/Throat: Oropharynx is clear and moist.  Eyes: Conjunctivae and EOM are normal. Pupils are equal, round, and reactive to light. No scleral icterus.  Neck: Normal range of motion. Neck supple. No JVD present.  Cardiovascular: Normal rate, regular rhythm, normal heart sounds and intact distal pulses.   No murmur heard. Pulmonary/Chest: Effort normal. No respiratory  distress. She has wheezes (moderate diffuse wheezing present). She has no rales.  Abdominal: Soft. Bowel sounds are normal. She exhibits no distension. There is no tenderness. There is no rebound and no guarding.  Musculoskeletal: Normal range of motion. She exhibits no edema.  Neurological: She is alert and oriented to person, place, and time. She has normal strength. No cranial nerve deficit. GCS eye subscore is 4. GCS verbal subscore is 5. GCS motor subscore is 6.  Skin: Skin is warm and dry. No rash noted. She is not diaphoretic.  Psychiatric: She has a normal mood and affect.    ED Course  Procedures (including critical care time)  Labs Reviewed  CBC - Abnormal; Notable for the following:    Hemoglobin 10.2 (*)    HCT 32.3 (*)    MCV 77.1 (*)    MCH 24.3 (*)    RDW  17.6 (*)    All other components within normal limits  BASIC METABOLIC PANEL - Abnormal; Notable for the following:    Potassium 3.0 (*)    CO2 33 (*)    GFR calc non Af Amer 66 (*)    GFR calc Af Amer 76 (*)    All other components within normal limits  PRO B NATRIURETIC PEPTIDE - Abnormal; Notable for the following:    Pro B Natriuretic peptide (BNP) 2481.0 (*)    All other components within normal limits  PROTIME-INR - Abnormal; Notable for the following:    Prothrombin Time 36.7 (*)    INR 3.63 (*)    All other components within normal limits  TROPONIN I   Dg Chest 2 View  05/25/2011  *RADIOLOGY REPORT*  Clinical Data: Shortness of breath and cough.  CHEST - 2 VIEW  Comparison: 03/31/2011  Findings: Stable appearance of cardiac pacemaker and postoperative changes in the mediastinum since previous study.  Diffuse cardiac enlargement with mild prominence of pulmonary vascularity, slightly increased since the previous study.  Emphysematous changes and fibrosis in the lungs.  Blunting of the right costophrenic angle suggesting scarring.  This is stable since the previous study.  No pneumothorax.  No pulmonary edema.   IMPRESSION: Congestive changes in the heart and lungs with progression since previous study.  Emphysematous changes and chronic fibrosis in the lungs.  Scarring in the right costophrenic angle.  Original Report Authenticated By: Marlon Pel, M.D.     1. COPD with acute exacerbation   2. Retention of fluid       MDM  78yo CF with PMH significant for COPD (on 2L home o2) also has hx CHF and afib with pacemaker who presents to the ED primarily due to cough. Cough has increased from her baseline but no sputum production or fever. Denies chest pain. States that she always has some exertional dyspnea but this is unchanged from her baseline. Pt states that she would not have come to the ED but her daughter wanted her to get checked out for this cough. No JVD or pitting edema. Pt with wheezing but normal RR and o2 100% on 2L nasal cannula. Will check labs given hx mult medical problems. EKG with paced rhythm. Doubt ACS as her presentation with cough and unchanged baseline dyspnea would be very atypical. Pt with wheezing likely COPD exacerbation.   Giving albuterol and atrovent. Will tx with solumedrol.   BNP elevated but pt improving with albuterol. Will give dose of IV lasix. Will d/c on albuterol, prednisone and increase her lasix to BID for 2 d and have her f/u with her pcp.         Verne Carrow, MD 05/26/11 0001

## 2011-05-25 NOTE — ED Notes (Addendum)
Pt is co sob and a cough for 3 days. Per EMS VSS with CBG 138. Pt is on home O2. Pt is COPD and CHF patient. Pt is also co blurred vision.

## 2011-05-26 DIAGNOSIS — F0390 Unspecified dementia without behavioral disturbance: Secondary | ICD-10-CM | POA: Diagnosis not present

## 2011-05-26 DIAGNOSIS — D680 Von Willebrand's disease: Secondary | ICD-10-CM | POA: Diagnosis not present

## 2011-05-26 DIAGNOSIS — R7301 Impaired fasting glucose: Secondary | ICD-10-CM | POA: Diagnosis not present

## 2011-05-26 DIAGNOSIS — I10 Essential (primary) hypertension: Secondary | ICD-10-CM | POA: Diagnosis not present

## 2011-05-26 DIAGNOSIS — Z794 Long term (current) use of insulin: Secondary | ICD-10-CM | POA: Diagnosis not present

## 2011-05-26 DIAGNOSIS — J449 Chronic obstructive pulmonary disease, unspecified: Secondary | ICD-10-CM | POA: Diagnosis not present

## 2011-05-26 DIAGNOSIS — K219 Gastro-esophageal reflux disease without esophagitis: Secondary | ICD-10-CM | POA: Diagnosis not present

## 2011-05-26 DIAGNOSIS — Z7901 Long term (current) use of anticoagulants: Secondary | ICD-10-CM | POA: Diagnosis not present

## 2011-05-26 DIAGNOSIS — Z79899 Other long term (current) drug therapy: Secondary | ICD-10-CM | POA: Diagnosis not present

## 2011-05-26 DIAGNOSIS — E079 Disorder of thyroid, unspecified: Secondary | ICD-10-CM | POA: Diagnosis not present

## 2011-05-26 DIAGNOSIS — F068 Other specified mental disorders due to known physiological condition: Secondary | ICD-10-CM | POA: Diagnosis not present

## 2011-05-26 DIAGNOSIS — R7989 Other specified abnormal findings of blood chemistry: Secondary | ICD-10-CM | POA: Diagnosis not present

## 2011-05-26 DIAGNOSIS — E119 Type 2 diabetes mellitus without complications: Secondary | ICD-10-CM | POA: Diagnosis not present

## 2011-05-26 DIAGNOSIS — I4891 Unspecified atrial fibrillation: Secondary | ICD-10-CM | POA: Diagnosis not present

## 2011-05-26 DIAGNOSIS — I2510 Atherosclerotic heart disease of native coronary artery without angina pectoris: Secondary | ICD-10-CM | POA: Diagnosis not present

## 2011-05-26 NOTE — ED Provider Notes (Signed)
I saw and evaluated the patient, reviewed the resident's note and I agree with the findings and plan.  The patient presented here at the request of her daughter for evaluation of chest congestion and cough.  On exam, I was unable to appreciate any edema, rales on exam, and she was not in respiratory distress.  The workup returned with an elevated bnp, but ekg and labs were otherwise unremarkable.  She will be discharged with an increased dose of lasix and prednisone and albuterol.  To follow up with pcp and return to the ED prn.   Geoffery Lyons, MD 05/26/11 [phone]

## 2011-05-27 DIAGNOSIS — E785 Hyperlipidemia, unspecified: Secondary | ICD-10-CM | POA: Diagnosis not present

## 2011-05-27 DIAGNOSIS — R0602 Shortness of breath: Secondary | ICD-10-CM | POA: Diagnosis not present

## 2011-05-27 DIAGNOSIS — I509 Heart failure, unspecified: Secondary | ICD-10-CM | POA: Diagnosis not present

## 2011-05-27 DIAGNOSIS — K219 Gastro-esophageal reflux disease without esophagitis: Secondary | ICD-10-CM | POA: Diagnosis not present

## 2011-05-27 DIAGNOSIS — E1169 Type 2 diabetes mellitus with other specified complication: Secondary | ICD-10-CM | POA: Diagnosis not present

## 2011-05-27 DIAGNOSIS — I10 Essential (primary) hypertension: Secondary | ICD-10-CM | POA: Diagnosis not present

## 2011-05-27 DIAGNOSIS — Z79899 Other long term (current) drug therapy: Secondary | ICD-10-CM | POA: Diagnosis not present

## 2011-05-27 DIAGNOSIS — Z7901 Long term (current) use of anticoagulants: Secondary | ICD-10-CM | POA: Diagnosis not present

## 2011-05-27 DIAGNOSIS — E119 Type 2 diabetes mellitus without complications: Secondary | ICD-10-CM | POA: Diagnosis not present

## 2011-05-27 DIAGNOSIS — H538 Other visual disturbances: Secondary | ICD-10-CM | POA: Diagnosis not present

## 2011-05-27 DIAGNOSIS — I4891 Unspecified atrial fibrillation: Secondary | ICD-10-CM | POA: Diagnosis not present

## 2011-06-26 ENCOUNTER — Encounter: Payer: Self-pay | Admitting: *Deleted

## 2011-06-30 ENCOUNTER — Ambulatory Visit: Payer: Medicare Other | Admitting: Cardiology

## 2011-07-10 ENCOUNTER — Encounter: Payer: Medicare Other | Admitting: Cardiology

## 2011-07-10 NOTE — Progress Notes (Signed)
HPI: [name] is a female who has a history of coronary artery disease status post coronary bypassing graft performed in 2000 for fu. She also has a history of permanent atrial fibrillation and has had prior AV node ablation.  There was also a question of prior von Willebrand disease.  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 Her last Myoview was performed in the hospital in Nov 2011 and showed inferior infarct and mild peri-infarct ischemia and an ejection fraction of 47%.  Last echocardiogram in Oct 2012 showed an EF of 45-50. There was biatrial enlargement and mild MR. Since she was last seen she does have some dyspnea on exertion but has significant lung disease. There is no orthopnea, PND, pedal edema, palpitations, syncope, chest pain or bleeding.  Current Outpatient Prescriptions  Medication Sig Dispense Refill  . albuterol (PROVENTIL HFA;VENTOLIN HFA) 108 (90 BASE) MCG/ACT inhaler Inhale 2-4 puffs into the lungs every 2 (two) hours as needed for wheezing or shortness of breath.  1 Inhaler  0  . budesonide-formoterol (SYMBICORT) 160-4.5 MCG/ACT inhaler Inhale 1 puff into the lungs 2 (two) times daily.        . cyanocobalamin (COBAL-1000) 1000 MCG/ML injection Inject 1,000 mcg into the muscle. Take twice a month.       . diltiazem (DILACOR XR) 120 MG 24 hr capsule Take 120 mg by mouth daily.      . furosemide (LASIX) 40 MG tablet Take 40 mg by mouth daily.        . insulin glargine (LANTUS) 100 UNIT/ML injection Inject 20 Units into the skin at bedtime.  10 mL    . insulin glargine (LANTUS) 100 UNIT/ML injection Inject 45 Units into the skin every morning.      . insulin lispro (HUMALOG KWIKPEN) 100 UNIT/ML injection Inject 10-12 Units into the skin 3 (three) times daily as needed. Give 10 units three times a day if blood sugar less than 225. Give 12 units three times a day if blood sugar 225 or  higher.      . levalbuterol (XOPENEX HFA) 45 MCG/ACT inhaler Inhale 1-2 puffs into the lungs 4 (four) times daily.        Marland Kitchen levothyroxine (SYNTHROID, LEVOTHROID) 200 MCG tablet Take 200 mcg by mouth daily.      . metoprolol (LOPRESSOR) 50 MG tablet Take 50 mg by mouth 2 (two) times daily.      . nitroGLYCERIN (NITROSTAT) 0.4 MG SL tablet Place 0.4 mg under the tongue every 5 (five) minutes as needed. For chest pain.      . pantoprazole (PROTONIX) 40 MG tablet Take 40 mg by mouth daily.        . potassium chloride (K-DUR,KLOR-CON) 10 MEQ tablet Take 10 mEq by mouth daily.        . sertraline (ZOLOFT) 100 MG tablet Take 100 mg by mouth daily.        Marland Kitchen warfarin (COUMADIN) 5 MG tablet Take 5 mg by mouth daily. Takes 5mg  on Tuesday, Thursday, Saturday, Sunday and 7.5mg  other days of the week.          Past Medical History  Diagnosis Date  . CHF (congestive heart failure)   . Cardiac pacemaker in situ   . Anemia, pernicious   . Atrial fibrillation     S/P AV node ablation  . Von Willebrand's disease   . Gallbladder  disease   . Hypothyroidism   . Diabetes mellitus   . CAD (coronary artery disease)   . Acute asthmatic bronchitis   . COPD (chronic obstructive pulmonary disease)   . Shortness of breath     Past Surgical History  Procedure Date  . Coronary artery bypass graft   . Mandibular reconstruction w/ iliac bone graft   . Appendectomy   . Hernia repair     History   Social History  . Marital Status: Married    Spouse Name: N/A    Number of Children: N/A  . Years of Education: N/A   Occupational History  . Not on file.   Social History Main Topics  . Smoking status: Former Smoker    Quit date: 03/29/1989  . Smokeless tobacco: Not on file  . Alcohol Use: No  . Drug Use: Not on file  . Sexually Active: Yes   Other Topics Concern  . Not on file   Social History Narrative   Lives with daugher    ROS: no fevers or chills, productive cough, hemoptysis, dysphasia,  odynophagia, melena, hematochezia, dysuria, hematuria, rash, seizure activity, orthopnea, PND, pedal edema, claudication. Remaining systems are negative.  Physical Exam: Well-developed well-nourished in no acute distress.  Skin is warm and dry.  HEENT is normal.  Neck is supple. No thyromegaly.  Chest is clear to auscultation with normal expansion.  Cardiovascular exam is regular rate and rhythm.  Abdominal exam nontender or distended. No masses palpated. Extremities show no edema. neuro grossly intact  ECG     This encounter was created in error - please disregard.

## 2011-07-19 DIAGNOSIS — W06XXXA Fall from bed, initial encounter: Secondary | ICD-10-CM | POA: Diagnosis not present

## 2011-07-19 DIAGNOSIS — T1490XA Injury, unspecified, initial encounter: Secondary | ICD-10-CM | POA: Diagnosis not present

## 2011-07-19 DIAGNOSIS — R791 Abnormal coagulation profile: Secondary | ICD-10-CM | POA: Diagnosis not present

## 2011-07-19 DIAGNOSIS — R05 Cough: Secondary | ICD-10-CM | POA: Diagnosis not present

## 2011-07-22 ENCOUNTER — Telehealth: Payer: Self-pay | Admitting: Internal Medicine

## 2011-07-22 NOTE — Telephone Encounter (Signed)
Spoke with pt. She states needs rov with CDY since has not seen since 2011 and has had recent ED visits due to her SOB. She states currently breathing is doing fine. OV with CDY scheduled for 08/26/11, first available. I advised that she call sooner if needed. Pt verbalized understanding and states nothing further needed.

## 2011-07-23 DIAGNOSIS — E039 Hypothyroidism, unspecified: Secondary | ICD-10-CM | POA: Diagnosis not present

## 2011-07-23 DIAGNOSIS — E785 Hyperlipidemia, unspecified: Secondary | ICD-10-CM | POA: Diagnosis not present

## 2011-07-23 DIAGNOSIS — D510 Vitamin B12 deficiency anemia due to intrinsic factor deficiency: Secondary | ICD-10-CM | POA: Diagnosis not present

## 2011-08-19 ENCOUNTER — Other Ambulatory Visit: Payer: Self-pay | Admitting: Cardiology

## 2011-08-19 NOTE — Telephone Encounter (Signed)
Refill-warfarin (COUMADIN) 5 MG tablet   Patient daughter [name] call for temp refill for coumadin meds as mail order has not arrived as of yet.  Verified temp med pharmacy as [facility].

## 2011-08-19 NOTE — Telephone Encounter (Signed)
Attempted to return call to pt, LMOM TCB. We do not monitor pt's Coumadin, need to know who monitors so they can refill rx for Coumadin.

## 2011-08-26 ENCOUNTER — Ambulatory Visit: Payer: Medicare Other | Admitting: Internal Medicine

## 2011-09-10 ENCOUNTER — Ambulatory Visit: Payer: Medicare Other | Admitting: Cardiology

## 2011-09-11 ENCOUNTER — Other Ambulatory Visit: Payer: Self-pay | Admitting: Adult Health

## 2011-10-09 DIAGNOSIS — R05 Cough: Secondary | ICD-10-CM | POA: Diagnosis not present

## 2011-10-09 DIAGNOSIS — E1149 Type 2 diabetes mellitus with other diabetic neurological complication: Secondary | ICD-10-CM | POA: Diagnosis not present

## 2011-10-09 DIAGNOSIS — E538 Deficiency of other specified B group vitamins: Secondary | ICD-10-CM | POA: Diagnosis not present

## 2011-10-09 DIAGNOSIS — I10 Essential (primary) hypertension: Secondary | ICD-10-CM | POA: Diagnosis not present

## 2011-10-09 DIAGNOSIS — E559 Vitamin D deficiency, unspecified: Secondary | ICD-10-CM | POA: Diagnosis not present

## 2011-10-09 DIAGNOSIS — Z7901 Long term (current) use of anticoagulants: Secondary | ICD-10-CM | POA: Diagnosis not present

## 2011-10-09 DIAGNOSIS — E039 Hypothyroidism, unspecified: Secondary | ICD-10-CM | POA: Diagnosis not present

## 2011-10-15 DIAGNOSIS — F329 Major depressive disorder, single episode, unspecified: Secondary | ICD-10-CM | POA: Diagnosis not present

## 2011-10-15 DIAGNOSIS — J449 Chronic obstructive pulmonary disease, unspecified: Secondary | ICD-10-CM | POA: Diagnosis not present

## 2011-10-15 DIAGNOSIS — E119 Type 2 diabetes mellitus without complications: Secondary | ICD-10-CM | POA: Diagnosis not present

## 2011-10-15 DIAGNOSIS — I509 Heart failure, unspecified: Secondary | ICD-10-CM | POA: Diagnosis not present

## 2011-10-15 DIAGNOSIS — R413 Other amnesia: Secondary | ICD-10-CM | POA: Diagnosis not present

## 2011-10-15 DIAGNOSIS — I4891 Unspecified atrial fibrillation: Secondary | ICD-10-CM | POA: Diagnosis not present

## 2011-10-19 DIAGNOSIS — F329 Major depressive disorder, single episode, unspecified: Secondary | ICD-10-CM | POA: Diagnosis not present

## 2011-10-19 DIAGNOSIS — E119 Type 2 diabetes mellitus without complications: Secondary | ICD-10-CM | POA: Diagnosis not present

## 2011-10-19 DIAGNOSIS — J449 Chronic obstructive pulmonary disease, unspecified: Secondary | ICD-10-CM | POA: Diagnosis not present

## 2011-10-19 DIAGNOSIS — I509 Heart failure, unspecified: Secondary | ICD-10-CM | POA: Diagnosis not present

## 2011-10-19 DIAGNOSIS — I4891 Unspecified atrial fibrillation: Secondary | ICD-10-CM | POA: Diagnosis not present

## 2011-10-19 DIAGNOSIS — R413 Other amnesia: Secondary | ICD-10-CM | POA: Diagnosis not present

## 2011-11-02 ENCOUNTER — Ambulatory Visit: Payer: Medicare Other | Admitting: Cardiology

## 2011-11-04 DIAGNOSIS — E119 Type 2 diabetes mellitus without complications: Secondary | ICD-10-CM | POA: Diagnosis not present

## 2011-11-04 DIAGNOSIS — I4891 Unspecified atrial fibrillation: Secondary | ICD-10-CM | POA: Diagnosis not present

## 2011-11-04 DIAGNOSIS — I509 Heart failure, unspecified: Secondary | ICD-10-CM | POA: Diagnosis not present

## 2011-11-04 DIAGNOSIS — J449 Chronic obstructive pulmonary disease, unspecified: Secondary | ICD-10-CM | POA: Diagnosis not present

## 2011-11-04 DIAGNOSIS — F329 Major depressive disorder, single episode, unspecified: Secondary | ICD-10-CM | POA: Diagnosis not present

## 2011-11-04 DIAGNOSIS — R413 Other amnesia: Secondary | ICD-10-CM | POA: Diagnosis not present

## 2011-11-11 DIAGNOSIS — R413 Other amnesia: Secondary | ICD-10-CM | POA: Diagnosis not present

## 2011-11-11 DIAGNOSIS — I509 Heart failure, unspecified: Secondary | ICD-10-CM | POA: Diagnosis not present

## 2011-11-11 DIAGNOSIS — F329 Major depressive disorder, single episode, unspecified: Secondary | ICD-10-CM | POA: Diagnosis not present

## 2011-11-11 DIAGNOSIS — I4891 Unspecified atrial fibrillation: Secondary | ICD-10-CM | POA: Diagnosis not present

## 2011-11-11 DIAGNOSIS — J449 Chronic obstructive pulmonary disease, unspecified: Secondary | ICD-10-CM | POA: Diagnosis not present

## 2011-11-11 DIAGNOSIS — E119 Type 2 diabetes mellitus without complications: Secondary | ICD-10-CM | POA: Diagnosis not present

## 2012-01-13 DIAGNOSIS — R791 Abnormal coagulation profile: Secondary | ICD-10-CM | POA: Diagnosis not present

## 2012-01-13 DIAGNOSIS — E039 Hypothyroidism, unspecified: Secondary | ICD-10-CM | POA: Diagnosis not present

## 2012-01-13 DIAGNOSIS — E559 Vitamin D deficiency, unspecified: Secondary | ICD-10-CM | POA: Diagnosis not present

## 2012-01-13 DIAGNOSIS — E1149 Type 2 diabetes mellitus with other diabetic neurological complication: Secondary | ICD-10-CM | POA: Diagnosis not present

## 2012-01-13 DIAGNOSIS — Z7901 Long term (current) use of anticoagulants: Secondary | ICD-10-CM | POA: Diagnosis not present

## 2012-01-13 DIAGNOSIS — I10 Essential (primary) hypertension: Secondary | ICD-10-CM | POA: Diagnosis not present

## 2012-01-13 DIAGNOSIS — E538 Deficiency of other specified B group vitamins: Secondary | ICD-10-CM | POA: Diagnosis not present

## 2012-01-13 DIAGNOSIS — Z23 Encounter for immunization: Secondary | ICD-10-CM | POA: Diagnosis not present

## 2012-01-15 NOTE — Progress Notes (Signed)
This encounter was created in error - please disregard.

## 2012-01-31 IMAGING — CT CT HEAD W/O CM
1 of 2 series · 13 of 30 positions shown, 17 images · non-contrast
Comparison: CT 07/04/2009

CLINICAL DATA: Blurred vision

CT HEAD WITHOUT CONTRAST
TECHNIQUE: Contiguous axial images were obtained from the base of
the skull through the vertex without contrast.

[Series 2: brain · axial · 0.47mm/px · z∈[+118,+254]mm · 13 of 32 slices shown, 17 images]
[im 3/32  brain]
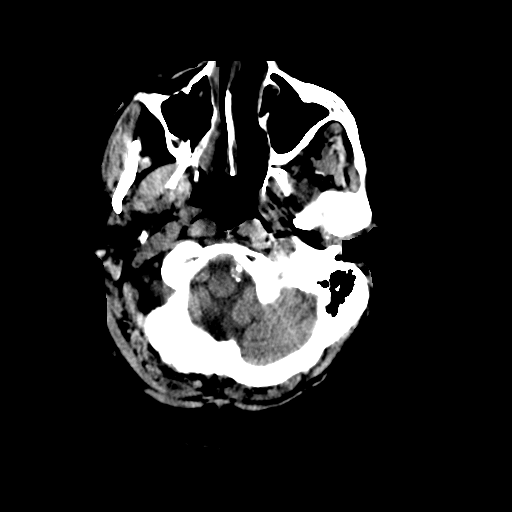
[im 3/32  bone]
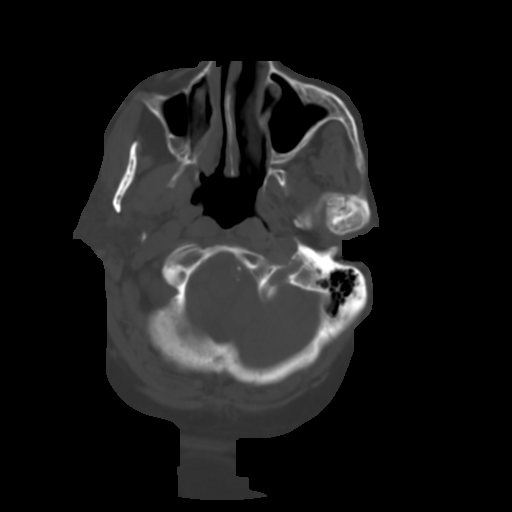
[im 5/32  brain]
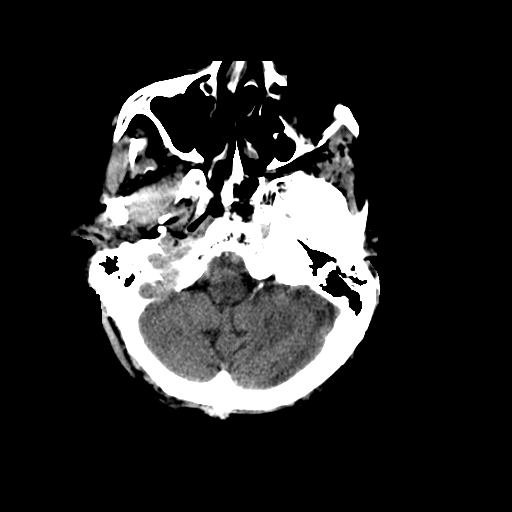
[im 7/32  brain]
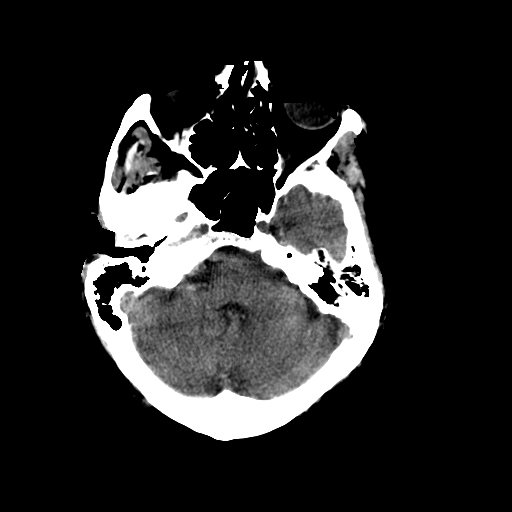
[im 9/32  brain]
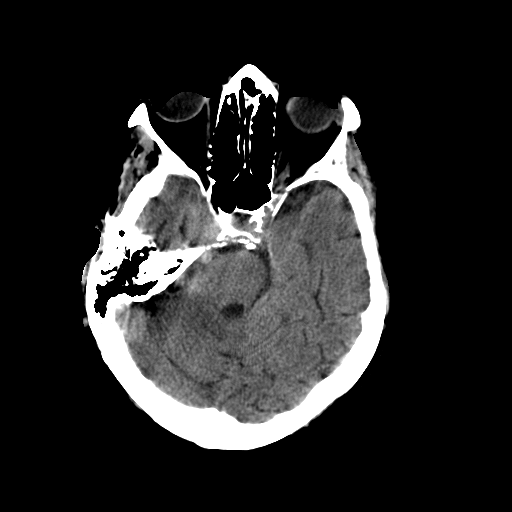
[im 12/32  brain]
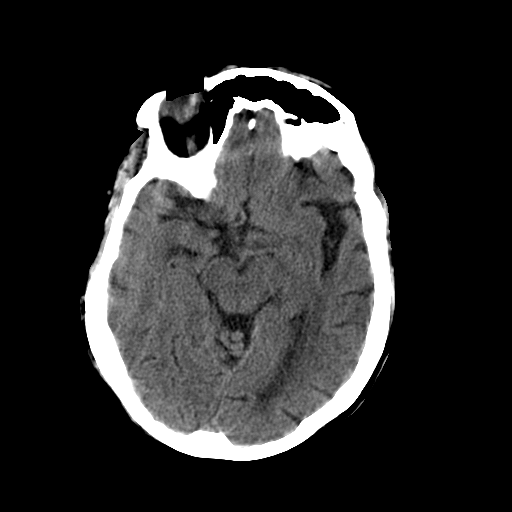
[im 12/32  bone]
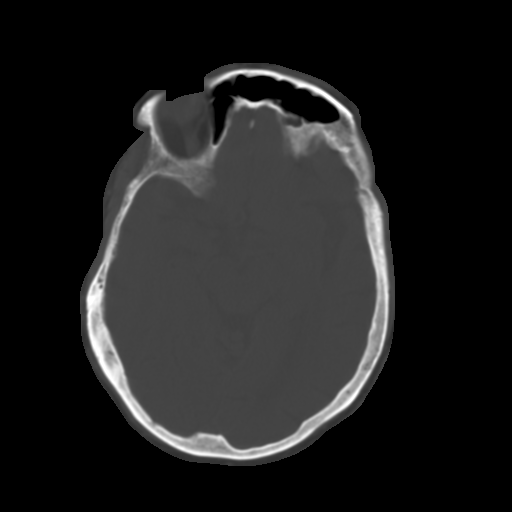
[im 14/32  brain]
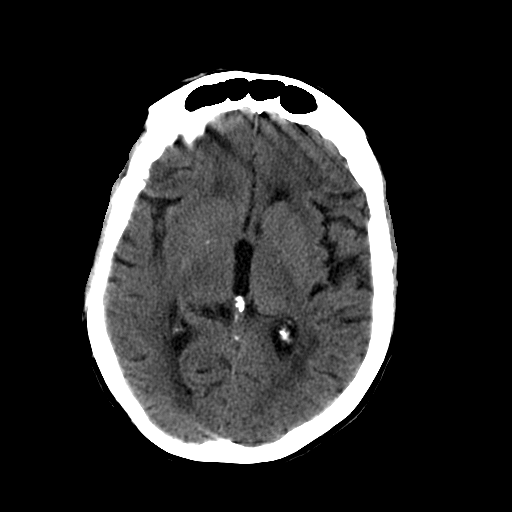
[im 16/32  brain]
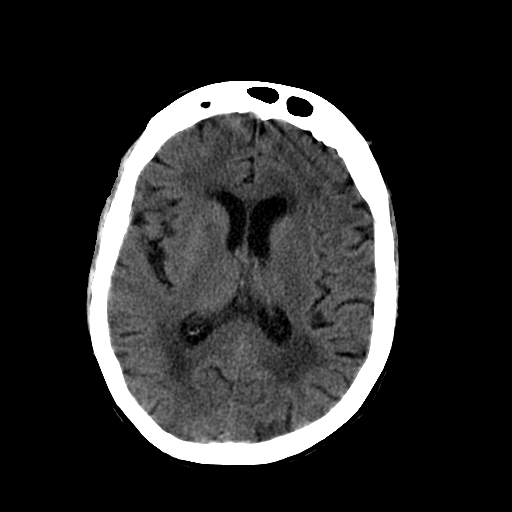
[im 18/32  brain]
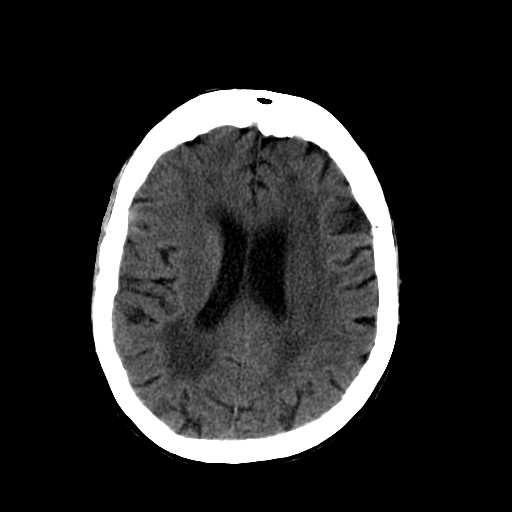
[im 20/32  brain]
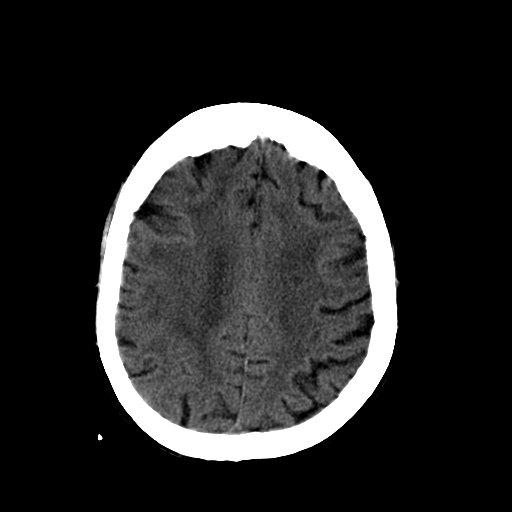
[im 20/32  bone]
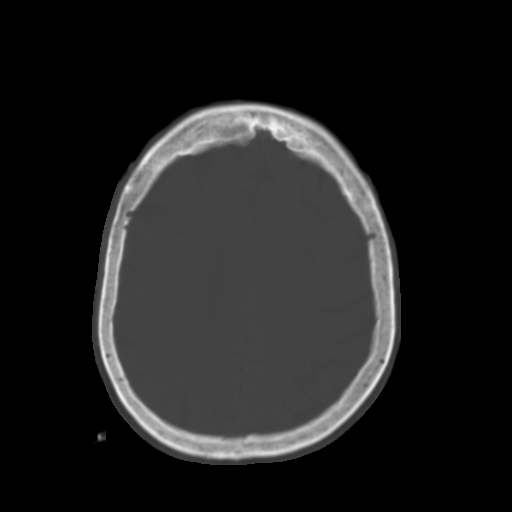
[im 23/32  brain]
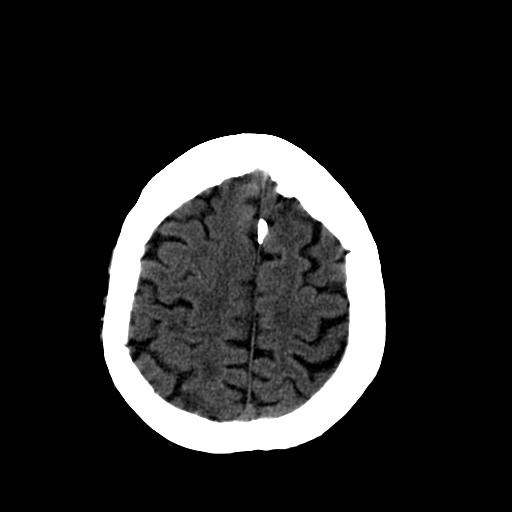
[im 25/32  brain]
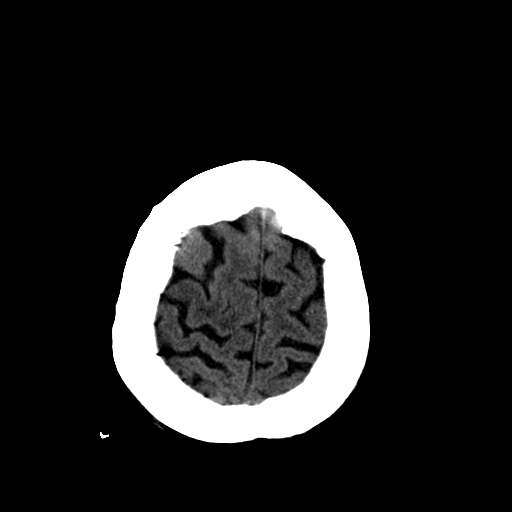
[im 27/32  brain]
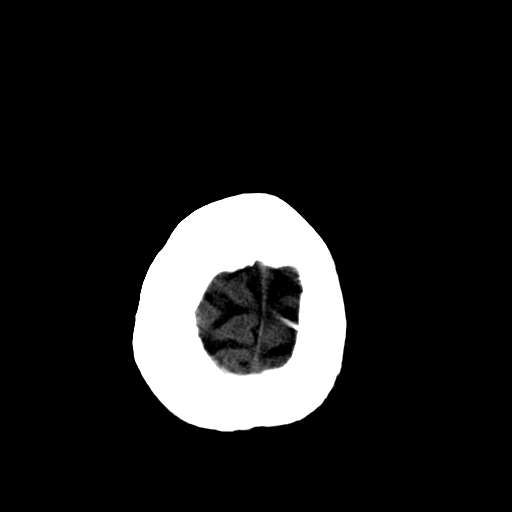
[im 29/32  brain]
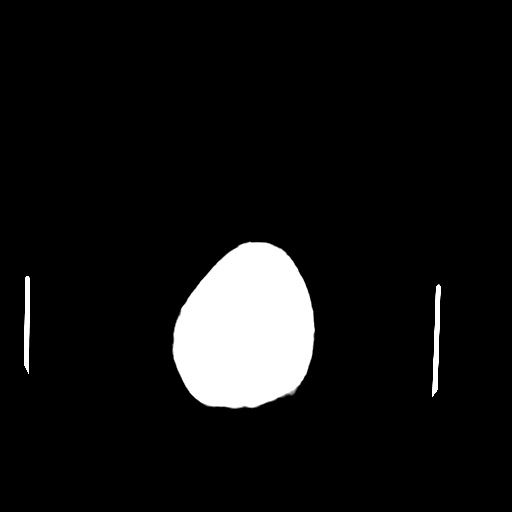
[im 29/32  bone]
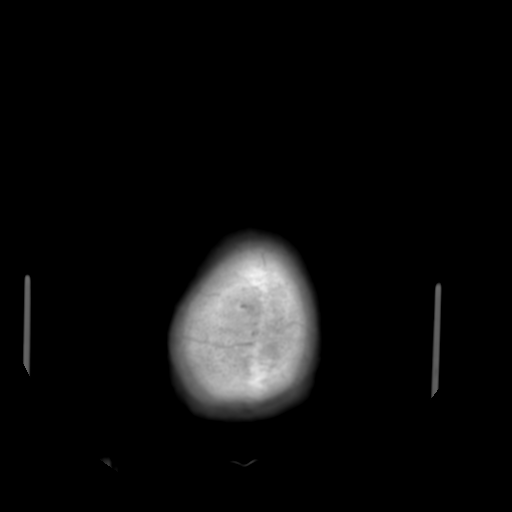

[13 of 30 positions shown; findings below may reference images not displayed]

FINDINGS: Generalized atrophy.  Advanced chronic microvascular
ischemia in the white matter is similar to the prior study.  No
acute infarct.  Negative for hemorrhage or mass.  Calvarium is
intact.
IMPRESSION: Extensive chronic microvascular ischemic change in the white
matter.  No acute infarct.

## 2012-02-05 IMAGING — CT CT HEAD W/O CM
1 series · 16 of 30 positions shown, 20 images · non-contrast
Comparison: CT head 09/12/2010.

CLINICAL DATA: Bilateral blurred vision for 2 weeks.

CT HEAD WITHOUT CONTRAST
TECHNIQUE: Contiguous axial images were obtained from the base of
the skull through the vertex without contrast.

[Series 2: head routine 4.8 h37s · axial · 0.39mm/px · z∈[-202,-47]mm · 16 of 36 slices shown, 20 images]
[im 2/36  brain]
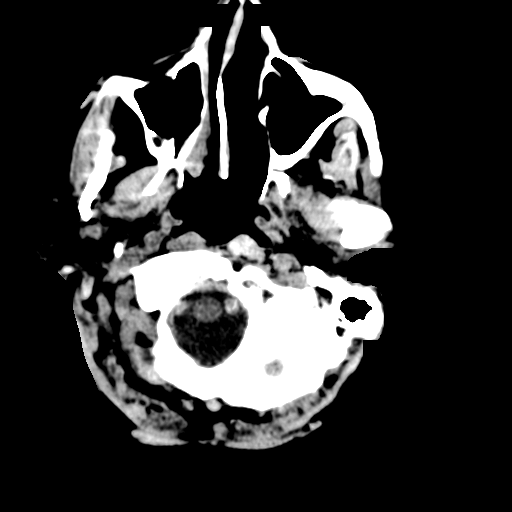
[im 2/36  bone]
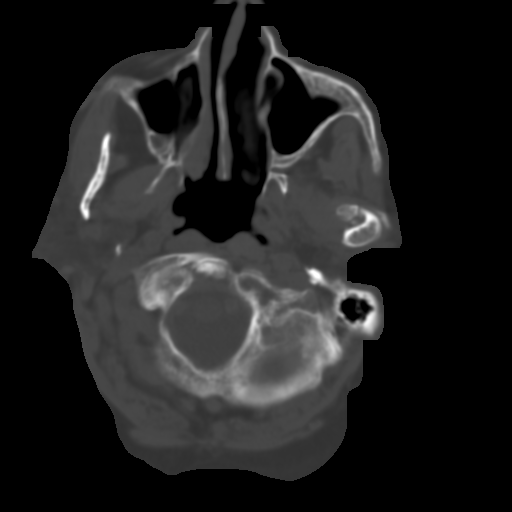
[im 4/36  brain]
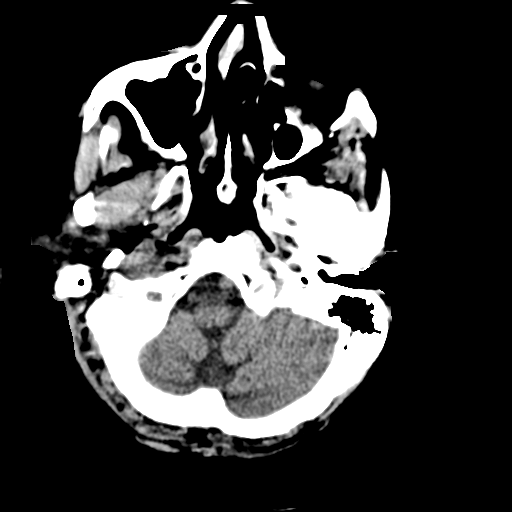
[im 7/36  brain]
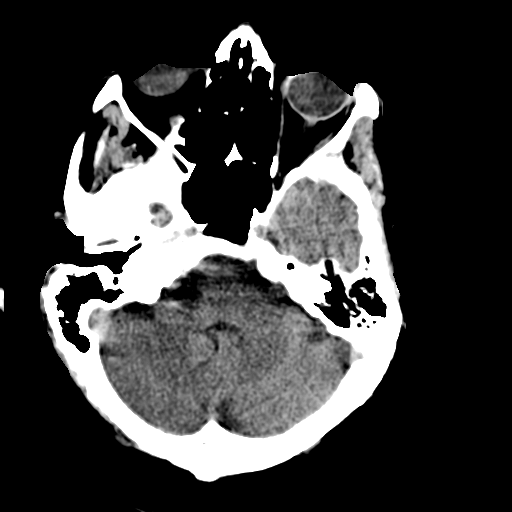
[im 9/36  brain]
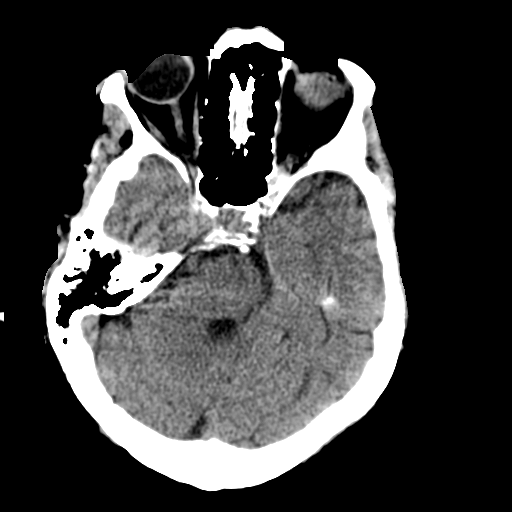
[im 10/36  brain]
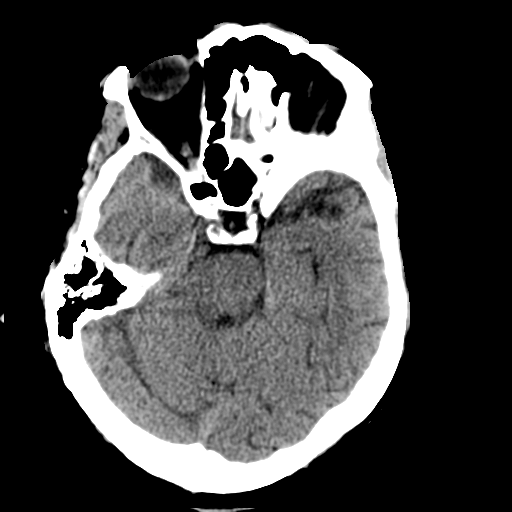
[im 10/36  bone]
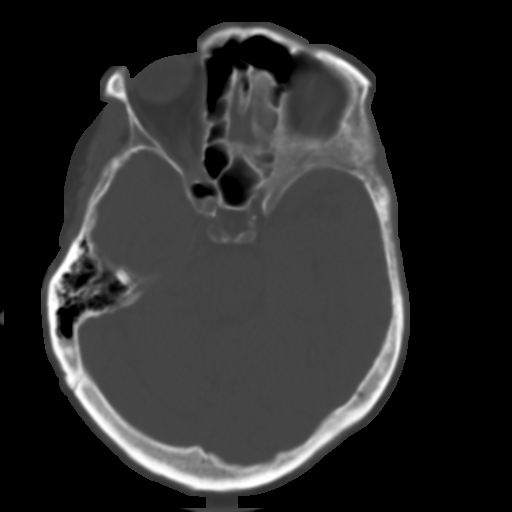
[im 13/36  brain]
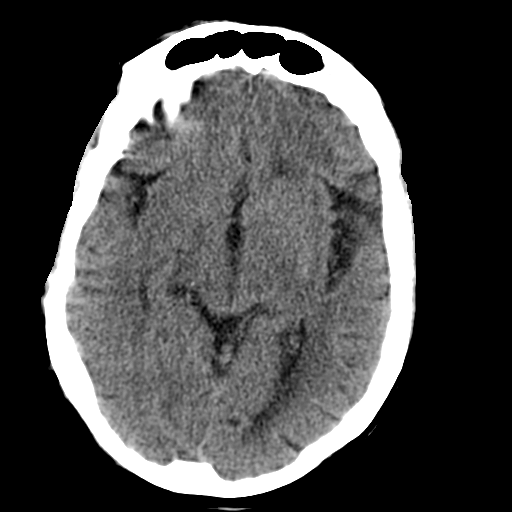
[im 15/36  brain]
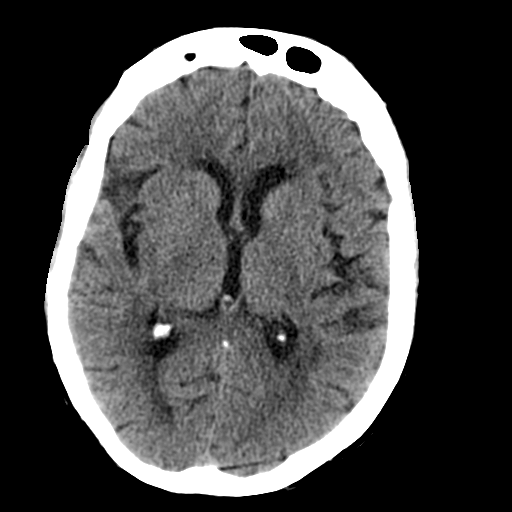
[im 17/36  brain]
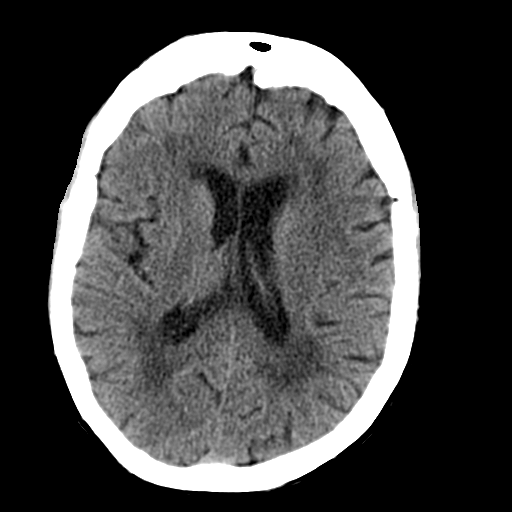
[im 19/36  brain]
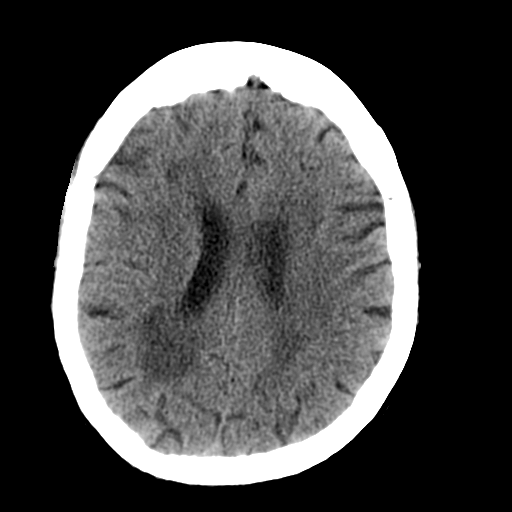
[im 19/36  bone]
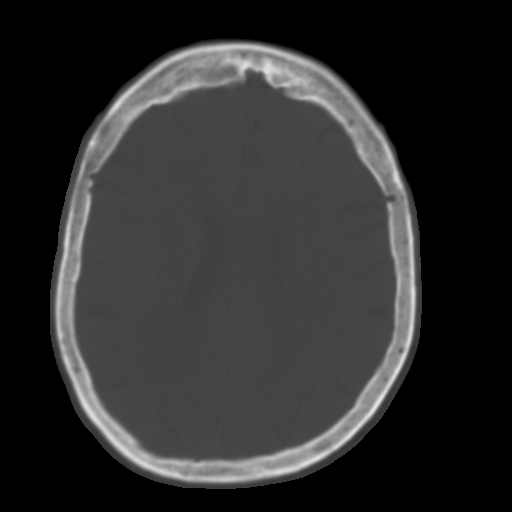
[im 21/36  brain]
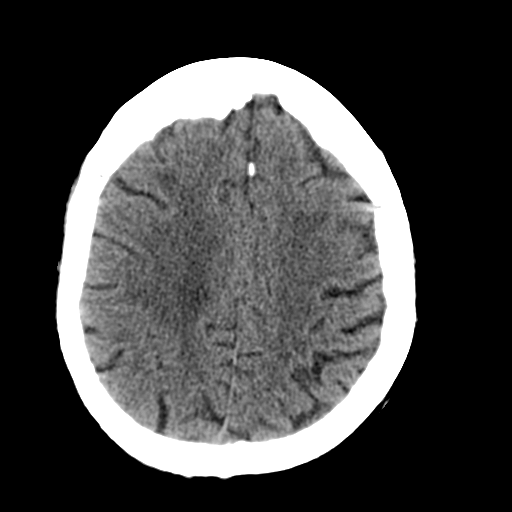
[im 23/36  brain]
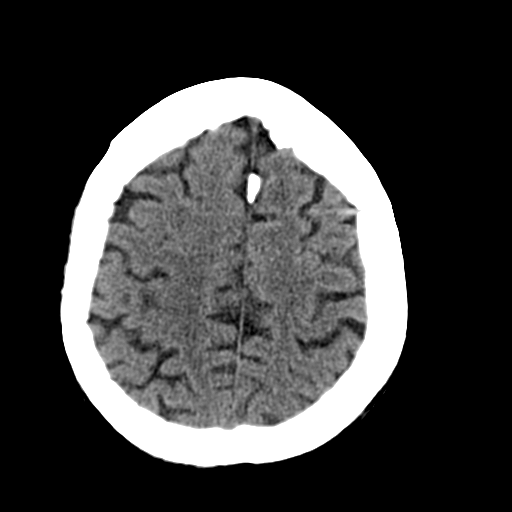
[im 26/36  brain]
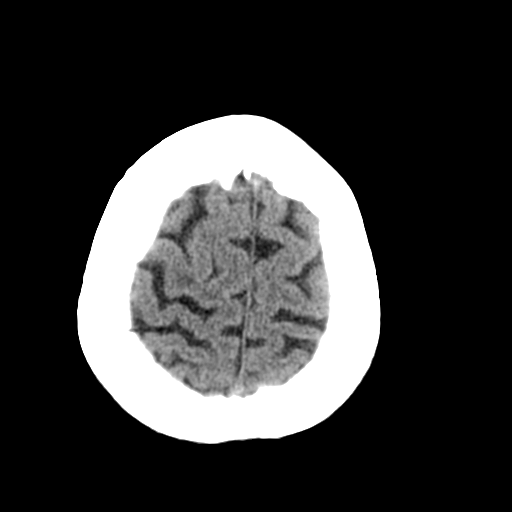
[im 27/36  brain]
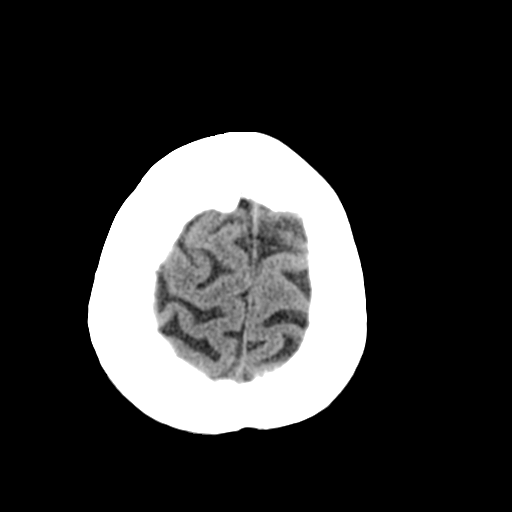
[im 27/36  bone]
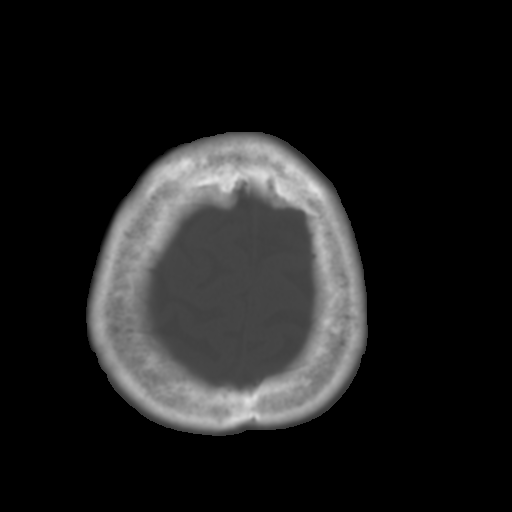
[im 29/36  brain]
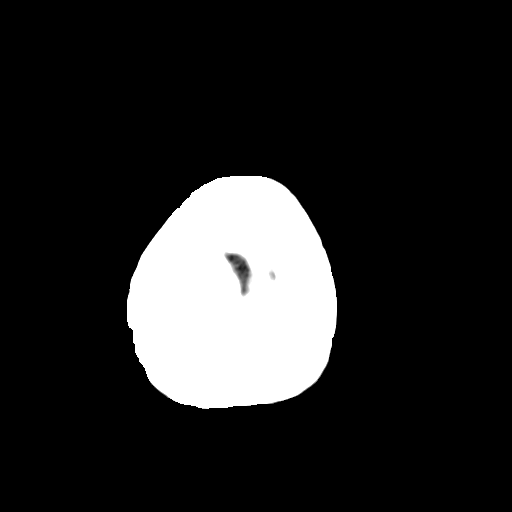
[im 32/36  brain]
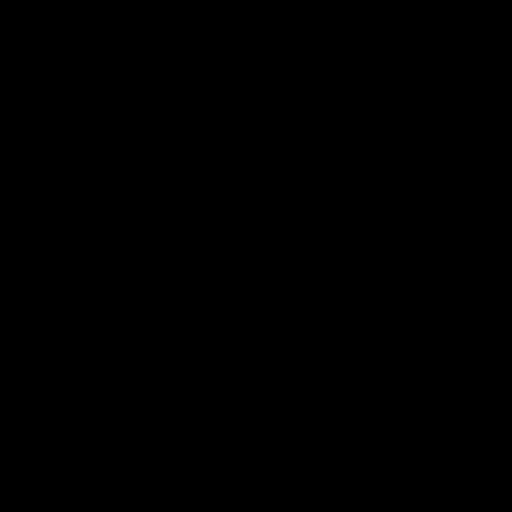
[im 34/36  brain]
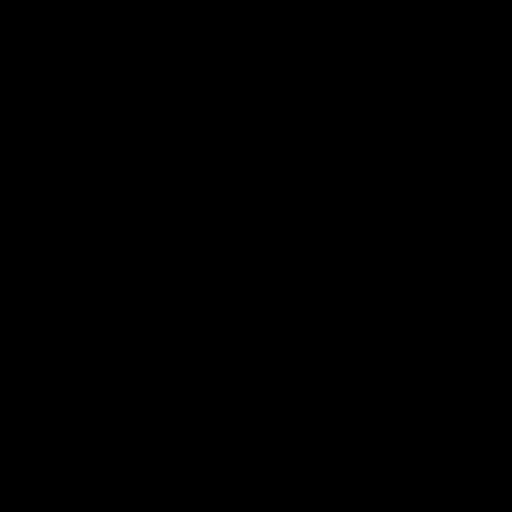

[16 of 30 positions shown; findings below may reference images not displayed]

FINDINGS: Extensive subcortical white matter changes are similar to
the prior exam.  No acute cortical infarct, hemorrhage, mass lesion
is present.  The ventricles are of normal size.  No significant
extra-axial fluid collection is present.

The paranasal sinuses and mastoid air cells are clear.  The osseous
skull is intact.
IMPRESSION: 1.  No acute intracranial abnormality or significant interval
change.
2.  Stable diffuse white matter disease.  This is nonspecific, but
likely reflects the sequelae of chronic microvascular ischemia.

## 2012-02-10 DIAGNOSIS — H538 Other visual disturbances: Secondary | ICD-10-CM | POA: Diagnosis not present

## 2012-02-10 DIAGNOSIS — R0602 Shortness of breath: Secondary | ICD-10-CM | POA: Diagnosis not present

## 2012-02-10 DIAGNOSIS — K219 Gastro-esophageal reflux disease without esophagitis: Secondary | ICD-10-CM | POA: Diagnosis not present

## 2012-02-10 DIAGNOSIS — I509 Heart failure, unspecified: Secondary | ICD-10-CM | POA: Diagnosis not present

## 2012-02-10 DIAGNOSIS — E039 Hypothyroidism, unspecified: Secondary | ICD-10-CM | POA: Diagnosis not present

## 2012-02-10 DIAGNOSIS — I10 Essential (primary) hypertension: Secondary | ICD-10-CM | POA: Diagnosis not present

## 2012-02-10 DIAGNOSIS — R079 Chest pain, unspecified: Secondary | ICD-10-CM | POA: Diagnosis not present

## 2012-02-10 DIAGNOSIS — Z950 Presence of cardiac pacemaker: Secondary | ICD-10-CM | POA: Diagnosis not present

## 2012-02-10 DIAGNOSIS — E7800 Pure hypercholesterolemia, unspecified: Secondary | ICD-10-CM | POA: Diagnosis not present

## 2012-02-10 DIAGNOSIS — F0390 Unspecified dementia without behavioral disturbance: Secondary | ICD-10-CM | POA: Diagnosis not present

## 2012-02-10 DIAGNOSIS — I4891 Unspecified atrial fibrillation: Secondary | ICD-10-CM | POA: Diagnosis not present

## 2012-02-10 DIAGNOSIS — J441 Chronic obstructive pulmonary disease with (acute) exacerbation: Secondary | ICD-10-CM | POA: Diagnosis not present

## 2012-02-10 DIAGNOSIS — E119 Type 2 diabetes mellitus without complications: Secondary | ICD-10-CM | POA: Diagnosis not present

## 2012-02-24 ENCOUNTER — Emergency Department (HOSPITAL_COMMUNITY): Payer: Medicare Other

## 2012-02-24 ENCOUNTER — Emergency Department (HOSPITAL_COMMUNITY)
Admission: EM | Admit: 2012-02-24 | Discharge: 2012-02-24 | Disposition: A | Discharge status: Home / Self Care | Payer: Medicare Other | Attending: Emergency Medicine | Admitting: Emergency Medicine

## 2012-02-24 DIAGNOSIS — Z8719 Personal history of other diseases of the digestive system: Secondary | ICD-10-CM | POA: Diagnosis not present

## 2012-02-24 DIAGNOSIS — I509 Heart failure, unspecified: Secondary | ICD-10-CM | POA: Insufficient documentation

## 2012-02-24 DIAGNOSIS — J9819 Other pulmonary collapse: Secondary | ICD-10-CM | POA: Diagnosis not present

## 2012-02-24 DIAGNOSIS — I2510 Atherosclerotic heart disease of native coronary artery without angina pectoris: Secondary | ICD-10-CM | POA: Insufficient documentation

## 2012-02-24 DIAGNOSIS — I4891 Unspecified atrial fibrillation: Secondary | ICD-10-CM | POA: Insufficient documentation

## 2012-02-24 DIAGNOSIS — Z87891 Personal history of nicotine dependence: Secondary | ICD-10-CM | POA: Insufficient documentation

## 2012-02-24 DIAGNOSIS — Z794 Long term (current) use of insulin: Secondary | ICD-10-CM | POA: Insufficient documentation

## 2012-02-24 DIAGNOSIS — Z950 Presence of cardiac pacemaker: Secondary | ICD-10-CM | POA: Insufficient documentation

## 2012-02-24 DIAGNOSIS — Z79899 Other long term (current) drug therapy: Secondary | ICD-10-CM | POA: Diagnosis not present

## 2012-02-24 DIAGNOSIS — E039 Hypothyroidism, unspecified: Secondary | ICD-10-CM | POA: Diagnosis not present

## 2012-02-24 DIAGNOSIS — J449 Chronic obstructive pulmonary disease, unspecified: Secondary | ICD-10-CM | POA: Insufficient documentation

## 2012-02-24 DIAGNOSIS — D510 Vitamin B12 deficiency anemia due to intrinsic factor deficiency: Secondary | ICD-10-CM | POA: Diagnosis not present

## 2012-02-24 DIAGNOSIS — R0602 Shortness of breath: Secondary | ICD-10-CM | POA: Diagnosis not present

## 2012-02-24 DIAGNOSIS — E119 Type 2 diabetes mellitus without complications: Secondary | ICD-10-CM | POA: Diagnosis not present

## 2012-02-24 DIAGNOSIS — J4489 Other specified chronic obstructive pulmonary disease: Secondary | ICD-10-CM | POA: Insufficient documentation

## 2012-02-24 LAB — BASIC METABOLIC PANEL
Calcium: 9 mg/dL (ref 8.4–10.5)
Creatinine, Ser: 0.89 mg/dL (ref 0.50–1.10)
GFR calc Af Amer: 67 mL/min — ABNORMAL LOW (ref >90)

## 2012-02-24 LAB — CBC
MCH: 23.5 pg — ABNORMAL LOW (ref 26.0–34.0)
MCV: 75.2 fL — ABNORMAL LOW (ref 78.0–100.0)
Platelets: 259 10*3/uL (ref 150–400)
RDW: 16.2 % — ABNORMAL HIGH (ref 11.5–15.5)
WBC: 9.9 10*3/uL (ref 4.0–10.5)

## 2012-02-24 LAB — POCT I-STAT TROPONIN I: Troponin i, poc: 0.02 ng/mL (ref 0.00–0.08)

## 2012-02-24 NOTE — ED Notes (Signed)
EMS CBG 127

## 2012-02-24 NOTE — ED Notes (Signed)
To Ed from home via EMS, c/o bilat edema since last night, also reports mild SOB, no pain, VSS, NAD

## 2012-02-24 NOTE — ED Provider Notes (Signed)
History     CSN: [id number]  Arrival date & time 02/24/12  1409   First MD Initiated Contact with Patient 02/24/12 1410      Chief Complaint  Patient presents with  . Leg Swelling    (Consider location/radiation/quality/duration/timing/severity/associated sxs/prior treatment) HPI CC: SOB  Pt w/ progressive worsening SOB over past few days. Pt w/ baseline SOB especially w/ ambulation requiring O2 while ambulating on a daily basis. Typically does not use at night but did last night. Symptoms associated w/ swelling of feet since last night. Denies any CP, syncope, lightheadedness, palpitations, n/v/d/c, fever, rash.     Past Medical History  Diagnosis Date  . CHF (congestive heart failure)   . Cardiac pacemaker in situ   . Anemia, pernicious   . Atrial fibrillation     S/P AV node ablation  . Von Willebrand's disease   . Gallbladder disease   . Hypothyroidism   . Diabetes mellitus   . CAD (coronary artery disease)   . Acute asthmatic bronchitis   . COPD (chronic obstructive pulmonary disease)   . Shortness of breath     Past Surgical History  Procedure Date  . Coronary artery bypass graft   . Mandibular reconstruction w/ iliac bone graft   . Appendectomy   . Hernia repair     No family history on file.  History  Substance Use Topics  . Smoking status: Former Smoker    Quit date: 03/29/1989  . Smokeless tobacco: Not on file  . Alcohol Use: No    OB History    Grav Para Term Preterm Abortions TAB SAB Ect Mult Living                  Review of Systems  All other systems reviewed and are negative.    Allergies  Carbamazepine; Cefaclor; Cephalexin; Clarithromycin; Dicyclomine; Dicyclomine hcl; Sulfonamide derivatives; Amoxicillin-pot clavulanate; and Sulfamethoxazole  Home Medications   Current Outpatient Rx  Name Route Sig Dispense Refill  . ALBUTEROL SULFATE HFA 108 (90 BASE) MCG/ACT IN AERS Inhalation Inhale 2-4 puffs into the lungs every 2 (two)  hours as needed for wheezing or shortness of breath. 1 Inhaler 0  . BUDESONIDE-FORMOTEROL FUMARATE 160-4.5 MCG/ACT IN AERO Inhalation Inhale 1 puff into the lungs 2 (two) times daily.      . CYANOCOBALAMIN 1000 MCG/ML IJ SOLN Intramuscular Inject 1,000 mcg into the muscle. Take twice a month.     Marland Kitchen DILTIAZEM HCL ER COATED BEADS 120 MG PO CP24  TAKE ONE CAPSULE BY MOUTH EVERY DAY 31 capsule 3  . DILTIAZEM HCL ER 120 MG PO CP24 Oral Take 120 mg by mouth daily.    . FUROSEMIDE 40 MG PO TABS Oral Take 40 mg by mouth daily.      . INSULIN GLARGINE 100 UNIT/ML [place] SOLN Subcutaneous Inject 20 Units into the skin at bedtime. 10 mL   . INSULIN GLARGINE 100 UNIT/ML [place] SOLN Subcutaneous Inject 45 Units into the skin every morning.    . INSULIN LISPRO (HUMAN) 100 UNIT/ML [place] SOLN Subcutaneous Inject 10-12 Units into the skin 3 (three) times daily as needed. Give 10 units three times a day if blood sugar less than 225. Give 12 units three times a day if blood sugar 225 or higher.    Marland Kitchen LEVALBUTEROL TARTRATE 45 MCG/ACT IN AERO Inhalation Inhale 1-2 puffs into the lungs 4 (four) times daily.      Marland Kitchen LEVOTHYROXINE SODIUM 200 MCG PO TABS Oral Take 200  mcg by mouth daily.    Marland Kitchen METOPROLOL TARTRATE 50 MG PO TABS Oral Take 50 mg by mouth 2 (two) times daily.    Marland Kitchen NITROGLYCERIN 0.4 MG SL SUBL Sublingual Place 0.4 mg under the tongue every 5 (five) minutes as needed. For chest pain.    Marland Kitchen PANTOPRAZOLE SODIUM 40 MG PO TBEC Oral Take 40 mg by mouth daily.      Marland Kitchen POTASSIUM CHLORIDE [place] ER 10 MEQ PO TBCR Oral Take 10 mEq by mouth daily.      . SERTRALINE HCL 100 MG PO TABS Oral Take 100 mg by mouth daily.      . WARFARIN SODIUM 5 MG PO TABS Oral Take 5 mg by mouth daily. Takes 5mg  on Tuesday, Thursday, Saturday, Sunday and 7.5mg  other days of the week.       BP 115/68  Pulse 72  Temp 99.1 F (37.3 C) (Oral)  Resp 16  SpO2 95%  Physical Exam  Constitutional: She appears well-developed and well-nourished. No distress.    HENT:  Head: Normocephalic.  Eyes: EOM are normal. Pupils are equal, round, and reactive to light.  Neck: Normal range of motion. No JVD present.  Cardiovascular: Normal heart sounds and intact distal pulses.   No murmur heard.      Irregularly irregular HR  Pulmonary/Chest: Effort normal and breath sounds normal. No respiratory distress. She has no wheezes. She has no rales. She exhibits no tenderness.       On RA  Abdominal: She exhibits no distension. There is no tenderness.  Musculoskeletal: Normal range of motion. She exhibits no tenderness.       Bilateral 1+ edema of feet.  <3 sec cap refill  Skin: She is not diaphoretic.    ED Course  Procedures (including critical care time)  Labs Reviewed  PRO B NATRIURETIC PEPTIDE - Abnormal; Notable for the following:    Pro B Natriuretic peptide (BNP) 1475.0 (*)     All other components within normal limits  CBC - Abnormal; Notable for the following:    Hemoglobin 10.8 (*)     HCT 34.6 (*)     MCV 75.2 (*)     MCH 23.5 (*)     RDW 16.2 (*)     All other components within normal limits  BASIC METABOLIC PANEL - Abnormal; Notable for the following:    Glucose, Bld 146 (*)     GFR calc non Af Amer 57 (*)     GFR calc Af Amer 67 (*)     All other components within normal limits  POCT I-STAT TROPONIN I   Dg Chest 2 View  02/24/2012  *RADIOLOGY REPORT*  Clinical Data:  left swelling  CHEST - 2 VIEW  Comparison: 02/10/2012  Findings: Cardiomegaly again noted.  Status post CABG.  Dual lead cardiac pacemaker is unchanged in position.  No acute infiltrate or pulmonary edema.  Stable mild basilar atelectasis.  Atherosclerotic calcifications of thoracic aorta.  IMPRESSION: No active disease.  No significant change.   Original Report Authenticated By: Natasha Mead, M.D.      No diagnosis found.    MDM  78yo F w/ PMHx of CHF, pacemaker, Afib, Von Willebrands disease, DM, CAD s/p CABG, COBD, presenting w/ increasing SOB and LE edema.  Concern for CHF exacerbation. EKG w/o evidence of pacemaker interention and showing A.fib. VS stable - CBC, BMP - Troponins and ProBNP - CXR    Update: Pt has been resting comfortably in bed  since arrival. No O2 requirement while resting in bed. ProBNP noted to be elevated. Has been previously elevated in ED. Pt CXR improved from previous film on 10/16. Pt refusing Lasix as she states this will make her have to pee all night. Pt aware of risks of continued cardiac strain. Pt to take double dose of lasix tomorrow morning w/ f/u w/ cardiologist this week.  - DC pt  - Pt to take Lasix 80mg  tomorrow. - Handout given        Ozella Rocks, MD 02/24/12 1648  Ozella Rocks, MD 02/24/12 [phone]

## 2012-02-24 NOTE — ED Provider Notes (Signed)
  I performed a history and physical examination of [name] and discussed her management with Dr. Konrad Dolores.  I agree with the history, physical, assessment, and plan of care, with the following exceptions: None On my exam the patient was in no distress.  We had a laughter-filled conversation.    I saw the ECG, relevant labs and studies - I agree with the interpretation.    Elyse Jarvis, MD 02/24/12 [phone]

## 2012-02-25 ENCOUNTER — Telehealth: Payer: Self-pay | Admitting: Cardiology

## 2012-02-25 NOTE — Telephone Encounter (Signed)
New problem:  Went to er on yesterday was advise to double up on Lasix.

## 2012-02-25 NOTE — Telephone Encounter (Signed)
Left message for pt to call.

## 2012-02-26 DIAGNOSIS — I509 Heart failure, unspecified: Secondary | ICD-10-CM | POA: Diagnosis not present

## 2012-02-26 DIAGNOSIS — J441 Chronic obstructive pulmonary disease with (acute) exacerbation: Secondary | ICD-10-CM | POA: Diagnosis not present

## 2012-02-26 DIAGNOSIS — I4891 Unspecified atrial fibrillation: Secondary | ICD-10-CM | POA: Diagnosis not present

## 2012-02-26 DIAGNOSIS — K219 Gastro-esophageal reflux disease without esophagitis: Secondary | ICD-10-CM | POA: Diagnosis not present

## 2012-02-26 DIAGNOSIS — Z794 Long term (current) use of insulin: Secondary | ICD-10-CM | POA: Diagnosis not present

## 2012-02-26 DIAGNOSIS — D649 Anemia, unspecified: Secondary | ICD-10-CM | POA: Diagnosis not present

## 2012-02-26 DIAGNOSIS — E039 Hypothyroidism, unspecified: Secondary | ICD-10-CM | POA: Diagnosis not present

## 2012-02-26 DIAGNOSIS — F0390 Unspecified dementia without behavioral disturbance: Secondary | ICD-10-CM | POA: Diagnosis not present

## 2012-02-26 DIAGNOSIS — I10 Essential (primary) hypertension: Secondary | ICD-10-CM | POA: Diagnosis not present

## 2012-02-26 DIAGNOSIS — E7800 Pure hypercholesterolemia, unspecified: Secondary | ICD-10-CM | POA: Diagnosis not present

## 2012-02-26 DIAGNOSIS — Z950 Presence of cardiac pacemaker: Secondary | ICD-10-CM | POA: Diagnosis not present

## 2012-02-26 DIAGNOSIS — R0602 Shortness of breath: Secondary | ICD-10-CM | POA: Diagnosis not present

## 2012-02-26 DIAGNOSIS — E119 Type 2 diabetes mellitus without complications: Secondary | ICD-10-CM | POA: Diagnosis not present

## 2012-02-26 DIAGNOSIS — Z7901 Long term (current) use of anticoagulants: Secondary | ICD-10-CM | POA: Diagnosis not present

## 2012-02-26 DIAGNOSIS — R0789 Other chest pain: Secondary | ICD-10-CM | POA: Diagnosis not present

## 2012-02-26 NOTE — Telephone Encounter (Signed)
Spoke with Trey Paula, he is questioning what to do with the pts lasix. Made aware we are unable to make recommendations regarding her care because we have not seen her in so long. He voiced understanding and will wait to discuss at follow up appt.

## 2012-02-28 DIAGNOSIS — R0602 Shortness of breath: Secondary | ICD-10-CM | POA: Diagnosis not present

## 2012-02-28 DIAGNOSIS — Z794 Long term (current) use of insulin: Secondary | ICD-10-CM | POA: Diagnosis not present

## 2012-02-28 DIAGNOSIS — Z951 Presence of aortocoronary bypass graft: Secondary | ICD-10-CM | POA: Diagnosis not present

## 2012-02-28 DIAGNOSIS — F0390 Unspecified dementia without behavioral disturbance: Secondary | ICD-10-CM | POA: Diagnosis not present

## 2012-02-28 DIAGNOSIS — E119 Type 2 diabetes mellitus without complications: Secondary | ICD-10-CM | POA: Diagnosis not present

## 2012-02-28 DIAGNOSIS — J449 Chronic obstructive pulmonary disease, unspecified: Secondary | ICD-10-CM | POA: Diagnosis not present

## 2012-02-28 DIAGNOSIS — H538 Other visual disturbances: Secondary | ICD-10-CM | POA: Diagnosis not present

## 2012-02-28 DIAGNOSIS — Z7901 Long term (current) use of anticoagulants: Secondary | ICD-10-CM | POA: Diagnosis not present

## 2012-02-28 DIAGNOSIS — R05 Cough: Secondary | ICD-10-CM | POA: Diagnosis not present

## 2012-02-28 DIAGNOSIS — Z79899 Other long term (current) drug therapy: Secondary | ICD-10-CM | POA: Diagnosis not present

## 2012-02-28 DIAGNOSIS — R609 Edema, unspecified: Secondary | ICD-10-CM | POA: Diagnosis not present

## 2012-02-29 DIAGNOSIS — Z7901 Long term (current) use of anticoagulants: Secondary | ICD-10-CM | POA: Diagnosis not present

## 2012-02-29 DIAGNOSIS — Z23 Encounter for immunization: Secondary | ICD-10-CM | POA: Diagnosis not present

## 2012-02-29 DIAGNOSIS — R0602 Shortness of breath: Secondary | ICD-10-CM | POA: Diagnosis not present

## 2012-02-29 DIAGNOSIS — E119 Type 2 diabetes mellitus without complications: Secondary | ICD-10-CM | POA: Diagnosis not present

## 2012-02-29 DIAGNOSIS — J449 Chronic obstructive pulmonary disease, unspecified: Secondary | ICD-10-CM | POA: Diagnosis not present

## 2012-02-29 DIAGNOSIS — E039 Hypothyroidism, unspecified: Secondary | ICD-10-CM | POA: Diagnosis not present

## 2012-02-29 DIAGNOSIS — H538 Other visual disturbances: Secondary | ICD-10-CM | POA: Diagnosis not present

## 2012-02-29 DIAGNOSIS — E785 Hyperlipidemia, unspecified: Secondary | ICD-10-CM | POA: Diagnosis not present

## 2012-02-29 DIAGNOSIS — R609 Edema, unspecified: Secondary | ICD-10-CM | POA: Diagnosis not present

## 2012-02-29 DIAGNOSIS — F0390 Unspecified dementia without behavioral disturbance: Secondary | ICD-10-CM | POA: Diagnosis not present

## 2012-02-29 DIAGNOSIS — Z79899 Other long term (current) drug therapy: Secondary | ICD-10-CM | POA: Diagnosis not present

## 2012-03-01 DIAGNOSIS — J309 Allergic rhinitis, unspecified: Secondary | ICD-10-CM | POA: Diagnosis not present

## 2012-03-01 DIAGNOSIS — J449 Chronic obstructive pulmonary disease, unspecified: Secondary | ICD-10-CM | POA: Diagnosis not present

## 2012-03-01 DIAGNOSIS — Z79899 Other long term (current) drug therapy: Secondary | ICD-10-CM | POA: Diagnosis not present

## 2012-03-01 DIAGNOSIS — H6980 Other specified disorders of Eustachian tube, unspecified ear: Secondary | ICD-10-CM | POA: Diagnosis not present

## 2012-03-02 DIAGNOSIS — R609 Edema, unspecified: Secondary | ICD-10-CM | POA: Diagnosis not present

## 2012-03-02 DIAGNOSIS — R0609 Other forms of dyspnea: Secondary | ICD-10-CM | POA: Diagnosis not present

## 2012-03-02 DIAGNOSIS — R0602 Shortness of breath: Secondary | ICD-10-CM | POA: Diagnosis not present

## 2012-03-02 DIAGNOSIS — R0989 Other specified symptoms and signs involving the circulatory and respiratory systems: Secondary | ICD-10-CM | POA: Diagnosis not present

## 2012-03-02 DIAGNOSIS — J209 Acute bronchitis, unspecified: Secondary | ICD-10-CM | POA: Diagnosis not present

## 2012-03-03 ENCOUNTER — Encounter: Payer: Self-pay | Admitting: Internal Medicine

## 2012-03-03 ENCOUNTER — Ambulatory Visit (INDEPENDENT_AMBULATORY_CARE_PROVIDER_SITE_OTHER): Payer: Medicare Other | Admitting: Physician Assistant

## 2012-03-03 ENCOUNTER — Encounter: Payer: Self-pay | Admitting: Physician Assistant

## 2012-03-03 ENCOUNTER — Ambulatory Visit (INDEPENDENT_AMBULATORY_CARE_PROVIDER_SITE_OTHER): Payer: Medicare Other | Admitting: *Deleted

## 2012-03-03 VITALS — BP 122/58 | HR 98 | Ht 65.5 in | Wt 201.1 lb

## 2012-03-03 DIAGNOSIS — I4891 Unspecified atrial fibrillation: Secondary | ICD-10-CM

## 2012-03-03 DIAGNOSIS — R9431 Abnormal electrocardiogram [ECG] [EKG]: Secondary | ICD-10-CM

## 2012-03-03 DIAGNOSIS — R609 Edema, unspecified: Secondary | ICD-10-CM | POA: Diagnosis not present

## 2012-03-03 DIAGNOSIS — R0602 Shortness of breath: Secondary | ICD-10-CM

## 2012-03-03 DIAGNOSIS — I5042 Chronic combined systolic (congestive) and diastolic (congestive) heart failure: Secondary | ICD-10-CM

## 2012-03-03 DIAGNOSIS — I2510 Atherosclerotic heart disease of native coronary artery without angina pectoris: Secondary | ICD-10-CM | POA: Diagnosis not present

## 2012-03-03 MED ORDER — FUROSEMIDE 40 MG PO TABS: 60.0000 mg | ORAL_TABLET | Freq: Every day | ORAL | Status: DC

## 2012-03-03 MED ORDER — POTASSIUM CHLORIDE CRYS ER 10 MEQ PO TBCR: 20.0000 meq | EXTENDED_RELEASE_TABLET | Freq: Every day | ORAL | Status: DC

## 2012-03-03 NOTE — Patient Instructions (Addendum)
Your physician has recommended you make the following change in your medication:  INCREASE LASIX TO 60 MG DAILY INCREASE POTASSIUM TO 20 MEQ DAILY  Your physician recommends that you return for lab work in: 1 WEEK FOR BMET, BNP  Your physician has requested that you have a lexiscan myoview DX ABNORMAL EKG, 786.05. For further information please visit [email or web address] Please follow instruction sheet, as given.   Your physician has requested that you have an echocardiogram DX 786.05. Echocardiography is a painless test that uses sound waves to create images of your heart. It provides your doctor with information about the size and shape of your heart and how well your heart's chambers and valves are working. This procedure takes approximately one hour. There are no restrictions for this procedure.  Your physician recommends that you schedule a follow-up appointment in: 3-4 WEEKS WITH DR. CRENSHAW

## 2012-03-03 NOTE — Progress Notes (Signed)
[address], [address] [place], [place]  [address] Phone: [phone], Fax:  [phone]  Date:  03/03/2012   Name:  [name]   DOB:  January 02, 1925   MRN:  [id number]  PCP: Dr. Orvan Falconer in [place] Endocrinologist:  [name], MD  Primary Cardiologist:  Dr. Olga Millers  Primary Electrophysiologist:  Dr. Lewayne Bunting    History of Present Illness: [name] is a 78 y.o. female who returns for follow up.  She has a hx of CAD, s/p CABG, permanent AFib, symptomatic brady, s/p pacemaker, diastolic CHF, questionable von Willebrand's disease, hypothyroidism, DM2, COPD. Echocardiogram 10/12: Septal and apical HK, mild focal basal hypertrophy the septum, EF 45-50%, mild MR, moderate LAE, mild RAE, PASP 39.  Myoview 11/11: inferior scar with mild peri-infarct ischemia, EF 45%.  She was last seen by Dr. Jens Som while hospitalized 5/12 for volume overload.  She has not been seen in this office since 2011.  She comes in today with her son.  The patient and her son are very argumentative with one another.  It is very difficult to obtain a history.  Both the patient and the son speak at the same time and provide contradictory information.  Patient was seen in the ED 10/30 for LE edema.  BNP was elevated.  No edema on CXR.  She was asked to increase her Lasix and follow up here.  Apparently the patient has also been seen in the ED at [facility] on several occasions as well.  When asked, the patient can not really explain to me why she goes to the ED other than "my family makes me go."  The patient's son tells me that the patient frequently calls 911.  Overall, she seems to be concerned about her LE edema.  She has baseline dyspnea.  She denies chest pain.  No syncope.  No orthopnea, PND.  No cough.  Labs (10/13):  K 3.7, creatinine 0.89, proBNP 1475, Hgb 10.8,  CXR: no active disease   Wt Readings from Last 3 Encounters:  03/03/12 201 lb 1.9 oz (91.227 kg)  05/25/11 200 lb  (90.719 kg)  04/03/11 204 lb 9.4 oz (92.8 kg)     Past Medical History  Diagnosis Date  . CHF (congestive heart failure)   . Cardiac pacemaker in situ   . Anemia, pernicious   . Atrial fibrillation     S/P AV node ablation  . Von Willebrand's disease   . Gallbladder disease   . Hypothyroidism   . Diabetes mellitus   . CAD (coronary artery disease)   . Acute asthmatic bronchitis   . COPD (chronic obstructive pulmonary disease)   . Shortness of breath     Current Outpatient Prescriptions  Medication Sig Dispense Refill  . albuterol (PROVENTIL HFA;VENTOLIN HFA) 108 (90 BASE) MCG/ACT inhaler Inhale 2-4 puffs into the lungs every 2 (two) hours as needed for wheezing or shortness of breath.  1 Inhaler  0  . diltiazem (CARDIZEM CD) 120 MG 24 hr capsule TAKE ONE CAPSULE BY MOUTH EVERY DAY  31 capsule  3  . diltiazem (DILACOR XR) 120 MG 24 hr capsule Take 120 mg by mouth daily.      . furosemide (LASIX) 40 MG tablet Take 40 mg by mouth daily.        . insulin glargine (LANTUS) 100 UNIT/ML injection Inject 60 Units into the skin at bedtime.      . insulin lispro (HUMALOG KWIKPEN) 100 UNIT/ML injection Inject 10-12  Units into the skin 3 (three) times daily as needed. Give 10 units three times a day if blood sugar less than 225. Give 12 units three times a day if blood sugar 225 or higher.      . levalbuterol (XOPENEX HFA) 45 MCG/ACT inhaler Inhale 1-2 puffs into the lungs 4 (four) times daily.        Marland Kitchen levothyroxine (SYNTHROID, LEVOTHROID) 200 MCG tablet Take 200 mcg by mouth daily.      . metoprolol (LOPRESSOR) 50 MG tablet Take 50 mg by mouth 2 (two) times daily.      . nitroGLYCERIN (NITROSTAT) 0.4 MG SL tablet Place 0.4 mg under the tongue every 5 (five) minutes as needed. For chest pain.      . pantoprazole (PROTONIX) 40 MG tablet Take 40 mg by mouth daily.        . potassium chloride (K-DUR,KLOR-CON) 10 MEQ tablet Take 10 mEq by mouth daily.        . sertraline (ZOLOFT) 100 MG tablet  Take 100 mg by mouth daily.        Marland Kitchen warfarin (COUMADIN) 5 MG tablet Take 5 mg by mouth daily. Takes 5mg  on Tuesday, Thursday, Saturday, Sunday and 7.5mg  other days of the week.       . [DISCONTINUED] insulin glargine (LANTUS) 100 UNIT/ML injection Inject 20 Units into the skin at bedtime.  10 mL    . [DISCONTINUED] insulin glargine (LANTUS) 100 UNIT/ML injection Inject 45 Units into the skin every morning.        Allergies: Allergies  Allergen Reactions  . Carbamazepine Diarrhea  . Cefaclor     REACTION: unspecified  . Cephalexin Hives  . Clarithromycin Hives  . Dicyclomine Diarrhea and Nausea And Vomiting  . Dicyclomine Hcl     REACTION: unspecified  . Sulfonamide Derivatives Hives  . Amoxicillin-Pot Clavulanate Rash  . Sulfamethoxazole Rash    REACTION: unspecified    Social History:  The patient  reports that she quit smoking about 22 years ago. She does not have any smokeless tobacco history on file. She reports that she does not drink alcohol.   ROS:  Please see the history of present illness.   All other systems reviewed and negative.   PHYSICAL EXAM: VS:  BP 122/58  Pulse 98  Ht 5' 5.5" (1.664 m)  Wt 201 lb 1.9 oz (91.227 kg)  BMI 32.96 kg/m2  SpO2 91% Well nourished, well developed, in no acute distress HEENT: normal Neck: no JVD Cardiac:  normal S1, S2; RRR; no murmur Lungs:  clear to auscultation bilaterally, no wheezing, rhonchi or rales Abd: soft, nontender, no hepatomegaly Ext: trace to 1+ bilateral ankle edema Skin: warm and dry Neuro:  CNs 2-12 intact, no focal abnormalities noted  EKG:  AFib, intermittently V-paced, HR 74, intrinsic beats with inf-lat TWI      ASSESSMENT AND PLAN:  1. Chronic Combined Systolic and Diastolic CHF:   She has been to the ED several times with leg edema.  Her BNP was elevated last week.  She has baseline dyspnea.  She is a difficult historian as noted.  She tells me she does not take her Lasix.  Her son tells me that her  family is in charge of giving the patient her medications and she is taking Lasix every day.  Increase Lasix to 60 mg QD and K+ 20 mEq QD.  Check a BMET in one week.  Obtain a follow up echo.  Follow up with Dr.  Olga Millers in 4 weeks.  2. Coronary Artery Disease:   As noted, the hx is difficult to obtain.  ECG appears to demonstrate underlying TWI.  However, this may be from intermittent pacing.  She has a recent hx of calling 911 and going to the ED but it is not entirely clear why.  It has been 2 years since her last nuclear study.  I will also obtain a Lexiscan Myoview.  Follow up as noted.  3. Atrial Fibrillation:   At first, she told me no one followed her coumadin and that she takes it sometimes.  After further questioning, the patient's son tells me that Dr. Lucianne Muss follows her coumadin and that he gives her her medications every day.  4. S/p Pacemaker:   Pacer was interrogated today.  5. Disposition:   I am not completely aware of what is going on with this patient's mental status.  She is alert and oriented to place.  She seems a little confuse about time and place.  She is argumentative with her son and he tells me that her PCP has ordered 24 hour nursing care.  He notes that he is in charge of giving her her medications.  He tells me that she is a fall risk and that she is unable to care for herself (states she has not had a bath in weeks).  When asked, the son and patient both tell me that she does not have a healthcare power of attorney.  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  Her exact diagnosis is elusive to me.    Signed, Tereso Newcomer, PA-C  3:55 PM 03/03/2012

## 2012-03-04 DIAGNOSIS — E119 Type 2 diabetes mellitus without complications: Secondary | ICD-10-CM | POA: Diagnosis not present

## 2012-03-04 DIAGNOSIS — J449 Chronic obstructive pulmonary disease, unspecified: Secondary | ICD-10-CM | POA: Diagnosis not present

## 2012-03-04 DIAGNOSIS — Z7901 Long term (current) use of anticoagulants: Secondary | ICD-10-CM | POA: Diagnosis not present

## 2012-03-04 LAB — PACEMAKER DEVICE OBSERVATION
BATTERY VOLTAGE: 2.9629 V
RV LEAD IMPEDENCE PM: 487.5 Ohm
RV LEAD THRESHOLD: 0.75 V

## 2012-03-04 NOTE — Progress Notes (Signed)
PPM check done by industry

## 2012-03-06 DIAGNOSIS — R0602 Shortness of breath: Secondary | ICD-10-CM | POA: Diagnosis not present

## 2012-03-06 DIAGNOSIS — Z7901 Long term (current) use of anticoagulants: Secondary | ICD-10-CM | POA: Diagnosis not present

## 2012-03-06 DIAGNOSIS — R5383 Other fatigue: Secondary | ICD-10-CM | POA: Diagnosis not present

## 2012-03-06 DIAGNOSIS — Z79899 Other long term (current) drug therapy: Secondary | ICD-10-CM | POA: Diagnosis not present

## 2012-03-06 DIAGNOSIS — J449 Chronic obstructive pulmonary disease, unspecified: Secondary | ICD-10-CM | POA: Diagnosis not present

## 2012-03-06 DIAGNOSIS — R5381 Other malaise: Secondary | ICD-10-CM | POA: Diagnosis not present

## 2012-03-06 DIAGNOSIS — J40 Bronchitis, not specified as acute or chronic: Secondary | ICD-10-CM | POA: Diagnosis not present

## 2012-03-06 DIAGNOSIS — H538 Other visual disturbances: Secondary | ICD-10-CM | POA: Diagnosis not present

## 2012-03-07 DIAGNOSIS — Z7901 Long term (current) use of anticoagulants: Secondary | ICD-10-CM | POA: Diagnosis not present

## 2012-03-07 DIAGNOSIS — J449 Chronic obstructive pulmonary disease, unspecified: Secondary | ICD-10-CM | POA: Diagnosis not present

## 2012-03-07 DIAGNOSIS — E119 Type 2 diabetes mellitus without complications: Secondary | ICD-10-CM | POA: Diagnosis not present

## 2012-03-08 ENCOUNTER — Encounter (HOSPITAL_COMMUNITY): Payer: Self-pay | Admitting: *Deleted

## 2012-03-08 ENCOUNTER — Emergency Department (HOSPITAL_COMMUNITY)
Admission: EM | Admit: 2012-03-08 | Discharge: 2012-03-08 | Disposition: A | Discharge status: Home / Self Care | Payer: Medicare Other | Attending: Emergency Medicine | Admitting: Emergency Medicine

## 2012-03-08 DIAGNOSIS — I2510 Atherosclerotic heart disease of native coronary artery without angina pectoris: Secondary | ICD-10-CM | POA: Insufficient documentation

## 2012-03-08 DIAGNOSIS — D6800 Von Willebrand disease, unspecified: Secondary | ICD-10-CM | POA: Insufficient documentation

## 2012-03-08 DIAGNOSIS — M542 Cervicalgia: Secondary | ICD-10-CM | POA: Diagnosis not present

## 2012-03-08 DIAGNOSIS — H538 Other visual disturbances: Secondary | ICD-10-CM | POA: Diagnosis not present

## 2012-03-08 DIAGNOSIS — I4891 Unspecified atrial fibrillation: Secondary | ICD-10-CM | POA: Diagnosis not present

## 2012-03-08 DIAGNOSIS — Z8719 Personal history of other diseases of the digestive system: Secondary | ICD-10-CM | POA: Diagnosis not present

## 2012-03-08 DIAGNOSIS — Z7901 Long term (current) use of anticoagulants: Secondary | ICD-10-CM | POA: Diagnosis not present

## 2012-03-08 DIAGNOSIS — R0602 Shortness of breath: Secondary | ICD-10-CM | POA: Diagnosis not present

## 2012-03-08 DIAGNOSIS — J449 Chronic obstructive pulmonary disease, unspecified: Secondary | ICD-10-CM | POA: Insufficient documentation

## 2012-03-08 DIAGNOSIS — Z79899 Other long term (current) drug therapy: Secondary | ICD-10-CM | POA: Diagnosis not present

## 2012-03-08 DIAGNOSIS — E039 Hypothyroidism, unspecified: Secondary | ICD-10-CM | POA: Diagnosis not present

## 2012-03-08 DIAGNOSIS — Z862 Personal history of diseases of the blood and blood-forming organs and certain disorders involving the immune mechanism: Secondary | ICD-10-CM | POA: Diagnosis not present

## 2012-03-08 DIAGNOSIS — Z87891 Personal history of nicotine dependence: Secondary | ICD-10-CM | POA: Diagnosis not present

## 2012-03-08 DIAGNOSIS — I252 Old myocardial infarction: Secondary | ICD-10-CM | POA: Diagnosis not present

## 2012-03-08 DIAGNOSIS — Z950 Presence of cardiac pacemaker: Secondary | ICD-10-CM | POA: Diagnosis not present

## 2012-03-08 DIAGNOSIS — I509 Heart failure, unspecified: Secondary | ICD-10-CM | POA: Diagnosis not present

## 2012-03-08 DIAGNOSIS — J4489 Other specified chronic obstructive pulmonary disease: Secondary | ICD-10-CM | POA: Insufficient documentation

## 2012-03-08 DIAGNOSIS — E119 Type 2 diabetes mellitus without complications: Secondary | ICD-10-CM | POA: Insufficient documentation

## 2012-03-08 DIAGNOSIS — R05 Cough: Secondary | ICD-10-CM | POA: Diagnosis not present

## 2012-03-08 DIAGNOSIS — R0609 Other forms of dyspnea: Secondary | ICD-10-CM | POA: Diagnosis not present

## 2012-03-08 DIAGNOSIS — Z951 Presence of aortocoronary bypass graft: Secondary | ICD-10-CM | POA: Diagnosis not present

## 2012-03-08 DIAGNOSIS — R0989 Other specified symptoms and signs involving the circulatory and respiratory systems: Secondary | ICD-10-CM | POA: Diagnosis not present

## 2012-03-08 DIAGNOSIS — H579 Unspecified disorder of eye and adnexa: Secondary | ICD-10-CM | POA: Diagnosis not present

## 2012-03-08 DIAGNOSIS — F17200 Nicotine dependence, unspecified, uncomplicated: Secondary | ICD-10-CM | POA: Diagnosis not present

## 2012-03-08 DIAGNOSIS — J441 Chronic obstructive pulmonary disease with (acute) exacerbation: Secondary | ICD-10-CM | POA: Diagnosis not present

## 2012-03-08 LAB — CBC WITH DIFFERENTIAL/PLATELET
Basophils Absolute: 0 10*3/uL (ref 0.0–0.1)
Basophils Relative: 0 % (ref 0–1)
Lymphocytes Relative: 8 % — ABNORMAL LOW (ref 12–46)
MCHC: 30.4 g/dL (ref 30.0–36.0)
Monocytes Absolute: 0.1 10*3/uL (ref 0.1–1.0)
Neutro Abs: 4.6 10*3/uL (ref 1.7–7.7)
Platelets: 230 10*3/uL (ref 150–400)
RDW: 17.1 % — ABNORMAL HIGH (ref 11.5–15.5)
WBC: 5.1 10*3/uL (ref 4.0–10.5)

## 2012-03-08 LAB — BASIC METABOLIC PANEL
CO2: 28 mEq/L (ref 19–32)
Calcium: 9.1 mg/dL (ref 8.4–10.5)
Chloride: 99 mEq/L (ref 96–112)
Creatinine, Ser: 0.82 mg/dL (ref 0.50–1.10)
GFR calc Af Amer: 74 mL/min — ABNORMAL LOW (ref >90)
Sodium: 138 mEq/L (ref 135–145)

## 2012-03-08 LAB — PROTIME-INR
INR: 1.71 — ABNORMAL HIGH (ref 0.00–1.49)
Prothrombin Time: 19.5 seconds — ABNORMAL HIGH (ref 11.6–15.2)

## 2012-03-08 NOTE — ED Provider Notes (Signed)
History     CSN: [id number]  Arrival date & time 03/08/12  1056   First MD Initiated Contact with Patient 03/08/12 1121      Chief Complaint  Patient presents with  . Blurred Vision  . Neck Pain    (Consider location/radiation/quality/duration/timing/severity/associated sxs/prior treatment) HPI.... blurred vision for 24 hours. Glucose has been slightly elevated. No confusion, mental status changes, arm or leg weakness, slurred speech, drooping of face, fever, chills, stiff neck. Nothing makes symptoms better or worse. Severity is mild.  Past Medical History  Diagnosis Date  . CHF (congestive heart failure)   . Cardiac pacemaker in situ   . Anemia, pernicious   . Atrial fibrillation     S/P AV node ablation  . Von Willebrand's disease   . Gallbladder disease   . Hypothyroidism   . Diabetes mellitus   . CAD (coronary artery disease)   . Acute asthmatic bronchitis   . COPD (chronic obstructive pulmonary disease)   . Shortness of breath     Past Surgical History  Procedure Date  . Coronary artery bypass graft   . Mandibular reconstruction w/ iliac bone graft   . Appendectomy   . Hernia repair     No family history on file.  History  Substance Use Topics  . Smoking status: Former Smoker    Quit date: 03/29/1989  . Smokeless tobacco: Not on file  . Alcohol Use: No    OB History    Grav Para Term Preterm Abortions TAB SAB Ect Mult Living                  Review of Systems  All other systems reviewed and are negative.    Allergies  Carbamazepine; Cefaclor; Cephalexin; Clarithromycin; Dicyclomine; Dicyclomine hcl; Sulfonamide derivatives; Amoxicillin-pot clavulanate; and Sulfamethoxazole  Home Medications   Current Outpatient Rx  Name  Route  Sig  Dispense  Refill  . ALBUTEROL SULFATE HFA 108 (90 BASE) MCG/ACT IN AERS   Inhalation   Inhale 2-4 puffs into the lungs every 2 (two) hours as needed for wheezing or shortness of breath.   1 Inhaler   0     . ALBUTEROL SULFATE (2.5 MG/3ML) 0.083% IN NEBU   Nebulization   Take 2.5 mg by nebulization every 6 (six) hours as needed. For wheezing or shortness of breath         . DILTIAZEM HCL ER COATED BEADS 120 MG PO CP24   Oral   Take 120 mg by mouth daily.         . ERGOCALCIFEROL 50000 UNITS PO CAPS   Oral   Take 50,000 Units by mouth once a week.         . FUROSEMIDE 40 MG PO TABS   Oral   Take 40 mg by mouth daily.         . GUAIFENESIN ER 600 MG PO TB12   Oral   Take 600-1,200 mg by mouth daily as needed. For congestion         . INSULIN GLARGINE 100 UNIT/ML [place] SOLN   Subcutaneous   Inject 60 Units into the skin at bedtime.         . INSULIN LISPRO (HUMAN) 100 UNIT/ML [place] SOLN   Subcutaneous   Inject 10-12 Units into the skin 3 (three) times daily as needed. Give 10 units three times a day if blood sugar less than 225. Give 12 units three times a day if blood sugar 225 or  higher.         Marland Kitchen LEVALBUTEROL TARTRATE 45 MCG/ACT IN AERO   Inhalation   Inhale 2 puffs into the lungs every 4 (four) hours as needed. For wheezing         . LEVOTHYROXINE SODIUM 200 MCG PO TABS   Oral   Take 200 mcg by mouth daily.         Marland Kitchen METOPROLOL SUCCINATE ER 100 MG PO TB24   Oral   Take 50 mg by mouth daily. Take with or immediately following a meal.         . NITROGLYCERIN 0.4 MG SL SUBL   Sublingual   Place 0.4 mg under the tongue every 5 (five) minutes as needed. For chest pain.         Marland Kitchen POTASSIUM CHLORIDE ER 10 MEQ PO TBCR   Oral   Take 10 mEq by mouth daily.         Marland Kitchen ROSUVASTATIN CALCIUM 10 MG PO TABS   Oral   Take 10 mg by mouth daily.         . SERTRALINE HCL 100 MG PO TABS   Oral   Take 100 mg by mouth daily.           . WARFARIN SODIUM 5 MG PO TABS   Oral   Take 5-7.5 mg by mouth daily. Takes 1.5 tablets on M, W, F and one tablet rest of week           BP 166/75  Pulse 74  Temp 98.4 F (36.9 C) (Oral)  Resp 16  SpO2 98%  Physical  Exam  Nursing note and vitals reviewed. Constitutional: She is oriented to person, place, and time. She appears well-developed and well-nourished.  HENT:  Head: Normocephalic and atraumatic.  Eyes: Conjunctivae normal and EOM are normal. Pupils are equal, round, and reactive to light.  Neck: Normal range of motion. Neck supple.  Cardiovascular: Normal rate, regular rhythm and normal heart sounds.   Pulmonary/Chest: Effort normal and breath sounds normal.  Abdominal: Soft. Bowel sounds are normal.  Musculoskeletal: Normal range of motion.  Neurological: She is alert and oriented to person, place, and time.  Skin: Skin is warm and dry.  Psychiatric: She has a normal mood and affect.    ED Course  Procedures (including critical care time)  Labs Reviewed  CBC WITH DIFFERENTIAL - Abnormal; Notable for the following:    Hemoglobin 9.8 (*)     HCT 32.2 (*)     MCV 75.4 (*)     MCH 23.0 (*)     RDW 17.1 (*)     Neutrophils Relative 91 (*)     Lymphocytes Relative 8 (*)     Lymphs Abs 0.4 (*)     Monocytes Relative 1 (*)     All other components within normal limits  BASIC METABOLIC PANEL - Abnormal; Notable for the following:    Glucose, Bld 206 (*)     GFR calc non Af Amer 63 (*)     GFR calc Af Amer 74 (*)     All other components within normal limits  PROTIME-INR   No results found.   1. Blurred vision       MDM  Gross vision normal. Funduscopic exam normal. Extraocular movements intact. Pupils are equal and reactive to light.  Suspect hyperglycemia as cause of blurred vision. Referral to ophthalmologist.        Donnetta Hutching, MD 03/08/12 1452

## 2012-03-08 NOTE — ED Notes (Signed)
Pt up ambulatory to the bathroom at this time 

## 2012-03-08 NOTE — ED Notes (Signed)
PT states seen at [facility] this am for the same, but states "I had all my bypasses here, they don't know anything about me at [facility]". Pt was discharged from [facility], called EMS and insisted to be brought here. Pt states neck pain started this am, able to move, but with pain. Reports blurred vision that started while she was at [facility] this am.

## 2012-03-08 NOTE — ED Notes (Signed)
Per EMS pt from home with c/o neck pain and blurred vision-unsure of when it started. No known injury. VSS. BG 212. Alert and oriented x 4.

## 2012-03-08 NOTE — ED Notes (Signed)
MD at bedside. 

## 2012-03-08 NOTE — ED Notes (Addendum)
Pt given ginger ale to drink. 

## 2012-03-09 DIAGNOSIS — J449 Chronic obstructive pulmonary disease, unspecified: Secondary | ICD-10-CM | POA: Diagnosis not present

## 2012-03-09 DIAGNOSIS — E119 Type 2 diabetes mellitus without complications: Secondary | ICD-10-CM | POA: Diagnosis not present

## 2012-03-09 DIAGNOSIS — Z7901 Long term (current) use of anticoagulants: Secondary | ICD-10-CM | POA: Diagnosis not present

## 2012-03-10 ENCOUNTER — Other Ambulatory Visit: Payer: Self-pay

## 2012-03-10 ENCOUNTER — Other Ambulatory Visit (INDEPENDENT_AMBULATORY_CARE_PROVIDER_SITE_OTHER): Payer: Medicare Other

## 2012-03-10 ENCOUNTER — Ambulatory Visit (HOSPITAL_COMMUNITY): Payer: Medicare Other | Attending: Cardiovascular Disease | Admitting: Radiology

## 2012-03-10 ENCOUNTER — Encounter: Payer: Self-pay | Admitting: Physician Assistant

## 2012-03-10 DIAGNOSIS — I2510 Atherosclerotic heart disease of native coronary artery without angina pectoris: Secondary | ICD-10-CM

## 2012-03-10 DIAGNOSIS — R9431 Abnormal electrocardiogram [ECG] [EKG]: Secondary | ICD-10-CM

## 2012-03-10 DIAGNOSIS — E119 Type 2 diabetes mellitus without complications: Secondary | ICD-10-CM | POA: Insufficient documentation

## 2012-03-10 DIAGNOSIS — R0602 Shortness of breath: Secondary | ICD-10-CM

## 2012-03-10 DIAGNOSIS — I379 Nonrheumatic pulmonary valve disorder, unspecified: Secondary | ICD-10-CM | POA: Insufficient documentation

## 2012-03-10 DIAGNOSIS — I509 Heart failure, unspecified: Secondary | ICD-10-CM | POA: Diagnosis not present

## 2012-03-10 DIAGNOSIS — J4489 Other specified chronic obstructive pulmonary disease: Secondary | ICD-10-CM | POA: Insufficient documentation

## 2012-03-10 DIAGNOSIS — J449 Chronic obstructive pulmonary disease, unspecified: Secondary | ICD-10-CM | POA: Diagnosis not present

## 2012-03-10 DIAGNOSIS — I059 Rheumatic mitral valve disease, unspecified: Secondary | ICD-10-CM | POA: Diagnosis not present

## 2012-03-10 DIAGNOSIS — I5042 Chronic combined systolic (congestive) and diastolic (congestive) heart failure: Secondary | ICD-10-CM | POA: Diagnosis not present

## 2012-03-10 DIAGNOSIS — I369 Nonrheumatic tricuspid valve disorder, unspecified: Secondary | ICD-10-CM | POA: Insufficient documentation

## 2012-03-10 DIAGNOSIS — I4891 Unspecified atrial fibrillation: Secondary | ICD-10-CM | POA: Insufficient documentation

## 2012-03-10 DIAGNOSIS — R609 Edema, unspecified: Secondary | ICD-10-CM

## 2012-03-10 DIAGNOSIS — Z7901 Long term (current) use of anticoagulants: Secondary | ICD-10-CM | POA: Diagnosis not present

## 2012-03-11 ENCOUNTER — Telehealth: Payer: Self-pay | Admitting: *Deleted

## 2012-03-11 DIAGNOSIS — Z7901 Long term (current) use of anticoagulants: Secondary | ICD-10-CM | POA: Diagnosis not present

## 2012-03-11 DIAGNOSIS — J449 Chronic obstructive pulmonary disease, unspecified: Secondary | ICD-10-CM | POA: Diagnosis not present

## 2012-03-11 DIAGNOSIS — E119 Type 2 diabetes mellitus without complications: Secondary | ICD-10-CM | POA: Diagnosis not present

## 2012-03-11 LAB — BASIC METABOLIC PANEL
BUN: 15 mg/dL (ref 6–23)
Chloride: 100 mEq/L (ref 96–112)
Creatinine, Ser: 0.9 mg/dL (ref 0.4–1.2)
GFR: 62.39 mL/min (ref >60.00)
Potassium: 4.1 mEq/L (ref 3.5–5.1)
Sodium: 138 mEq/L (ref 135–145)

## 2012-03-11 LAB — BRAIN NATRIURETIC PEPTIDE: Pro B Natriuretic peptide (BNP): 149 pg/mL — ABNORMAL HIGH (ref 0.0–100.0)

## 2012-03-11 NOTE — Progress Notes (Signed)
Echocardiogram performed on 03/10/2012 .  

## 2012-03-11 NOTE — Telephone Encounter (Signed)
Message copied by Tarri Fuller on Fri Mar 11, 2012  4:09 PM ------      Message from: [place], [place] T      Created: Thu Mar 10, 2012  4:58 PM       Ejection Fraction is lower than previous measurements.  LV wall motion also appears to be different.      She is due to have a stress test next week.  She needs to keep that appointment.      She will need earlier follow up to discuss her echo results and stress test results.      Arrange follow up with Dr. Olga Millers or me in the next 2-3 weeks.      Tereso Newcomer, PA-C  4:57 PM 03/10/2012

## 2012-03-11 NOTE — Telephone Encounter (Signed)
pt's son Trey Paula notified about echo results and important to keep stress test appt. moved f/u app to 12/2 per Dr. Jens Som and Bing Neighbors.; son verbalized understanding today,

## 2012-03-14 ENCOUNTER — Telehealth: Payer: Self-pay | Admitting: *Deleted

## 2012-03-14 ENCOUNTER — Ambulatory Visit (HOSPITAL_COMMUNITY): Payer: Medicare Other | Attending: Cardiology | Admitting: Radiology

## 2012-03-14 VITALS — BP 126/66 | Ht 65.0 in | Wt 200.0 lb

## 2012-03-14 DIAGNOSIS — R0602 Shortness of breath: Secondary | ICD-10-CM | POA: Diagnosis not present

## 2012-03-14 DIAGNOSIS — R609 Edema, unspecified: Secondary | ICD-10-CM | POA: Diagnosis not present

## 2012-03-14 DIAGNOSIS — I4891 Unspecified atrial fibrillation: Secondary | ICD-10-CM

## 2012-03-14 DIAGNOSIS — R9431 Abnormal electrocardiogram [ECG] [EKG]: Secondary | ICD-10-CM | POA: Diagnosis not present

## 2012-03-14 DIAGNOSIS — R079 Chest pain, unspecified: Secondary | ICD-10-CM | POA: Diagnosis not present

## 2012-03-14 DIAGNOSIS — I2510 Atherosclerotic heart disease of native coronary artery without angina pectoris: Secondary | ICD-10-CM

## 2012-03-14 DIAGNOSIS — I5042 Chronic combined systolic (congestive) and diastolic (congestive) heart failure: Secondary | ICD-10-CM

## 2012-03-14 MED ORDER — AMINOPHYLLINE 25 MG/ML IV SOLN
75.0000 mg | Freq: Once | INTRAVENOUS | Status: AC
  Administered 2012-03-14: 75 mg via INTRAVENOUS

## 2012-03-14 MED ORDER — TECHNETIUM TC 99M SESTAMIBI GENERIC - CARDIOLITE
30.0000 | Freq: Once | INTRAVENOUS | Status: AC | PRN
  Administered 2012-03-14: 30 via INTRAVENOUS

## 2012-03-14 MED ORDER — ALBUTEROL SULFATE (5 MG/ML) 0.5% IN NEBU
2.5000 mg | INHALATION_SOLUTION | Freq: Once | RESPIRATORY_TRACT | Status: AC
  Administered 2012-03-14: 2.5 mg via RESPIRATORY_TRACT

## 2012-03-14 MED ORDER — TECHNETIUM TC 99M SESTAMIBI GENERIC - CARDIOLITE
10.0000 | Freq: Once | INTRAVENOUS | Status: AC | PRN
  Administered 2012-03-14: 10 via INTRAVENOUS

## 2012-03-14 MED ORDER — REGADENOSON 0.4 MG/5ML IV SOLN
0.4000 mg | Freq: Once | INTRAVENOUS | Status: AC
  Administered 2012-03-14: 0.4 mg via INTRAVENOUS

## 2012-03-14 NOTE — Progress Notes (Signed)
[facility] SITE 3 NUCLEAR MED [address] [id number] [place] [address] [phone]  Cardiology Nuclear Med Study  [name] is a 78 y.o. female     MRN : [id number]     DOB: [birth year]  Procedure Date: 03/14/2012  Nuclear Med Background Indication for Stress Test:  Evaluation for Ischemia and Graft Patency History:  Asthma, COPD, Emphysema and 2000 Heart Cath-CABG AFIB, '10 Ablation 2010: Pacemaker: Bradycardia 11/11 MPS: EF: 45% mild peri-infarct ischemia 10/12 ECHO: EF: 45-50% 03/10/12 ECHO: Cardiac Risk Factors: Family History - CAD, History of Smoking and IDDM Type 2  Symptoms:  DOE, Fatigue and SOB   Nuclear Pre-Procedure Caffeine/Decaff Intake:  None NPO After: 6 pm   Lungs:  Lungs are very course with rhonchi, inspiratory and expiratory wheezing O2 Sat: 88% on 2% O2 via [place]. IV 0.9% NS with Angio Cath:  22g  IV Site: R Antecubital  IV Started by:  Milana Na, EMT-P  Chest Size (in):   Cup Size: D  Height: 5\' 5"  (1.651 m)  Weight:  200 lb (90.719 kg)  BMI:  Body mass index is 33.28 kg/(m^2). Tech Comments:  CBG 78 mg/dl this am No Lasix or insulin or Lopressor.  This patient was treated with an Albuterol TX. 5.0 mg via neb. This patient became very SOB and was reversed with Aminophylline 75 mg IV. Sob from the [place] was resolved.     Nuclear Med Study 1 or 2 day study: 1 day  Stress Test Type:  Eugenie Birks  Reading MD: Willa Rough, MD  Order Authorizing Provider:  B.Crenshaw  Resting Radionuclide: Technetium 27m Sestamibi  Resting Radionuclide Dose: 10.8 mCi   Stress Radionuclide:  Technetium 62m Sestamibi  Stress Radionuclide Dose: 32.9 mCi           Stress Protocol Rest HR: 75 Stress HR: 78  Rest BP: 126/66 Stress BP: 150/69  Exercise Time (min): n/a METS: n/a   Predicted Max HR: 135 bpm % Max HR: 57.78 bpm Rate Pressure Product: 10272   Dose of Adenosine (mg):  n/a Dose of Lexiscan: n/a mg  Dose of Atropine (mg): n/a  Dose of Dobutamine:  mcg/kg/min (at max HR)  Stress Test Technologist: Frederick Peers, EMT-P  Nuclear Technologist:  Domenic Polite, CNMT     Rest Procedure:  Myocardial perfusion imaging was performed at rest 45 minutes following the intravenous administration of Technetium 36m Sestamibi. Rest ECG: Paced rhythm  Stress Procedure:  The patient received IV Lexiscan 0.4 mg over 15-seconds.  Technetium 73m Sestamibi injected at 30-seconds.  There were non Dx with Lexiscan, sob, and rare pvcs.  Quantitative spect images were obtained after a 45 minute delay. Stress ECG: No significant change from baseline ECG  QPS Raw Data Images:  Patient motion noted; appropriate software correction applied. Stress Images:  There is a small area of decreased uptake at the apex. The degree of photon reduction is mild/moderate. Rest Images:  There is slight decreased activity in a very small area at the apex. Subtraction (SDS):  There is mild reversibility at the apex Transient Ischemic Dilatation (Normal <1.22):  1.12 Lung/Heart Ratio (Normal <0.45):  0.48  Quantitative Gated Spect Images QGS EDV:  95 ml QGS ESV:  37 ml  Impression Exercise Capacity:  Lexiscan with no exercise. BP Response:  Normal blood pressure response. Clinical Symptoms:  shortness of breath ECG Impression:  No significant ST segment change suggestive of ischemia. Comparison with Prior Nuclear Study: No images to compare  Overall Impression:  There is some reversibility at the apex. This is consistent with some mild apical ischemia. There is hypokinesis of the septum consistent with prior CABG. There is also mild hypokinesis of the apex.  LV Ejection Fraction: 61%.  LV Wall Motion:  There is hypokinesis of the septum consistent with prior CABG. There is also mild hypokinesis at the apex.  Willa Rough, MD

## 2012-03-14 NOTE — Telephone Encounter (Signed)
son called in today to se eif ok to give pt her albuterol breathing tx. I verified w/Tanya in [facility] and she said yes that it is actually recommended. son gave verbal understanding today

## 2012-03-14 NOTE — Telephone Encounter (Signed)
Message copied by Tarri Fuller on Mon Mar 14, 2012  8:55 AM ------      Message from: [place], [place] T      Created: Sat Mar 12, 2012  3:20 PM       BNP improved.      Potassium and kidney function look good.      Continue with current treatment plan.      Tereso Newcomer, PA-C  4:49 PM 03/10/2012

## 2012-03-15 DIAGNOSIS — Z7901 Long term (current) use of anticoagulants: Secondary | ICD-10-CM | POA: Diagnosis not present

## 2012-03-15 DIAGNOSIS — J449 Chronic obstructive pulmonary disease, unspecified: Secondary | ICD-10-CM | POA: Diagnosis not present

## 2012-03-15 DIAGNOSIS — Z961 Presence of intraocular lens: Secondary | ICD-10-CM | POA: Diagnosis not present

## 2012-03-15 DIAGNOSIS — E119 Type 2 diabetes mellitus without complications: Secondary | ICD-10-CM | POA: Diagnosis not present

## 2012-03-15 DIAGNOSIS — H04129 Dry eye syndrome of unspecified lacrimal gland: Secondary | ICD-10-CM | POA: Diagnosis not present

## 2012-03-15 NOTE — Addendum Note (Signed)
Addended by: Domenic Polite on: 03/15/2012 09:27 AM   Modules accepted: Orders

## 2012-03-16 ENCOUNTER — Encounter: Payer: Self-pay | Admitting: Physician Assistant

## 2012-03-17 ENCOUNTER — Telehealth: Payer: Self-pay | Admitting: Cardiology

## 2012-03-17 DIAGNOSIS — E119 Type 2 diabetes mellitus without complications: Secondary | ICD-10-CM | POA: Diagnosis not present

## 2012-03-17 DIAGNOSIS — J449 Chronic obstructive pulmonary disease, unspecified: Secondary | ICD-10-CM | POA: Diagnosis not present

## 2012-03-17 DIAGNOSIS — Z7901 Long term (current) use of anticoagulants: Secondary | ICD-10-CM | POA: Diagnosis not present

## 2012-03-17 NOTE — Telephone Encounter (Signed)
tried tcb to give stress test results, but there was no answer. I will try again later 

## 2012-03-17 NOTE — Telephone Encounter (Signed)
tried tcb to give stress test results, but there was no answer. I will try again later

## 2012-03-17 NOTE — Telephone Encounter (Signed)
He wondering about the results of stress test

## 2012-03-18 ENCOUNTER — Encounter: Payer: Self-pay | Admitting: Internal Medicine

## 2012-03-18 ENCOUNTER — Ambulatory Visit (INDEPENDENT_AMBULATORY_CARE_PROVIDER_SITE_OTHER): Payer: Medicare Other | Admitting: Internal Medicine

## 2012-03-18 VITALS — BP 118/76 | HR 74 | Ht 65.5 in | Wt 201.4 lb

## 2012-03-18 DIAGNOSIS — J449 Chronic obstructive pulmonary disease, unspecified: Secondary | ICD-10-CM | POA: Diagnosis not present

## 2012-03-18 DIAGNOSIS — J4489 Other specified chronic obstructive pulmonary disease: Secondary | ICD-10-CM | POA: Diagnosis not present

## 2012-03-18 DIAGNOSIS — J45909 Unspecified asthma, uncomplicated: Secondary | ICD-10-CM | POA: Diagnosis not present

## 2012-03-18 MED ORDER — ALBUTEROL SULFATE (2.5 MG/3ML) 0.083% IN NEBU: 2.5000 mg | INHALATION_SOLUTION | Freq: Four times a day (QID) | RESPIRATORY_TRACT | Status: DC | PRN

## 2012-03-18 MED ORDER — METHYLPREDNISOLONE ACETATE 80 MG/ML IJ SUSP
80.0000 mg | Freq: Once | INTRAMUSCULAR | Status: AC
  Administered 2012-03-18: 80 mg via INTRAMUSCULAR

## 2012-03-18 MED ORDER — LEVALBUTEROL HCL 0.63 MG/3ML IN NEBU
0.6300 mg | INHALATION_SOLUTION | Freq: Once | RESPIRATORY_TRACT | Status: AC
  Administered 2012-03-18: 0.63 mg via RESPIRATORY_TRACT

## 2012-03-18 NOTE — Progress Notes (Signed)
03/18/12- 87 yoF former smoker FOLLOWS FOR: SOB and getting worse(at night)-not sleeping well because of it(goes to sleep around 4-5am). Even with O2-walking makes her SOB as well.  Son here She is living in her own home. Shortness of breath worse in the last 2 weeks and worse supine so she delays going to bed. O2 2L/ Am Home Pt. no acute event and no chest pain or palpitation recently. Cardiology recently did echocardiogram and stress test. I reviewed those results with them today. Nebulized albuterol sometimes helps. ECHO 03/10/12 Study Conclusions: - Left ventricle: Wall thickness was increased in a pattern of mild LVH. There was focal basal hypertrophy. Systolic function was moderately reduced. The estimated ejection fraction was in the range of 35% to 40%. Akinesis of the mid-distalanteroseptal myocardium. - Mitral valve: Calcified annulus. Moderately thickened, moderately calcified leaflets . Mild regurgitation. - Left atrium: The atrium was moderately to severely dilated. - Right ventricle: Systolic function was moderately reduced. - Right atrium: The atrium was moderately to severely dilated. - Pulmonary arteries: Systolic pressure was mildly to moderately increased. PA peak pressure: 47mm Hg (S).  Myoview 03/16/12 Overall Impression: There is some reversibility at the apex. This is consistent with some mild apical ischemia. There is hypokinesis of the septum consistent with prior CABG. There is also mild hypokinesis of the apex.  LV Ejection Fraction: 61%. LV Wall Motion: There is hypokinesis of the septum consistent with prior CABG. There is also mild hypokinesis at the apex.  Willa Rough, MD  CXR 02/24/12 IMPRESSION:  No active disease. No significant change.  Original Report Authenticated By: Natasha Mead, M.D.   ROS-see HPI Constitutional:   No-   weight loss, night sweats, fevers, chills, fatigue, lassitude. HEENT:   No-  headaches, difficulty swallowing, tooth/dental  problems, sore throat,       No-  sneezing, itching, ear ache, nasal congestion, post nasal drip,  CV:  No-   chest pain, orthopnea, PND, swelling in lower extremities, anasarca, dizziness, palpitations Resp: +shortness of breath with exertion or at rest.              No-   productive cough,  No non-productive cough,  No- coughing up of blood.              No-   change in color of mucus.  No- wheezing.   Skin: No-   rash or lesions. GI:  No-   heartburn, indigestion, abdominal pain, nausea, vomiting,  GU:  MS:  No-   joint pain or swelling.   Neuro-     nothing unusual Psych:  No- change in mood or affect. No depression or anxiety.  No memory loss.  OBJ- Physical Exam  O2 2 L BP 118/76  Pulse 74  Ht 5' 5.5" (1.664 m)  Wt 201 lb 6.4 oz (91.354 kg)  BMI 33.00 kg/m2  SpO2 92% General- Alert, Oriented, Affect-appropriate, Distress- none acute Skin- rash-none, lesions- none, excoriation- none Lymphadenopathy- none Head- atraumatic            Eyes- Gross vision intact, PERRLA, conjunctivae and secretions clear            Ears- Hearing, canals-normal            Nose- Clear, no-Septal dev, mucus, polyps, erosion, perforation             Throat- Mallampati II , mucosa clear , drainage- none, tonsils- atrophic Neck- flexible , trachea midline, no stridor , thyroid nl, carotid no  bruit Chest - symmetrical excursion , unlabored           Heart/CV- RRR , no murmur , no gallop  , no rub, nl s1 s2                           - JVD- none , edema- none, stasis changes- none, varices- none           Lung- clear to P&A, wheezy/ raspy cough , dullness-none, rub- none           Chest wall- L pacemaker Abd-  Br/ Gen/ Rectal- Not done, not indicated Extrem- cyanosis- none, clubbing, none, atrophy- none, strength-rolling walker  nlNeuro- grossly intact to observation

## 2012-03-18 NOTE — Patient Instructions (Addendum)
Neb xop 0.63  Depo 80  script for nebulizer solution sent

## 2012-03-18 NOTE — Telephone Encounter (Signed)
lmptcb to go over myoview results 

## 2012-03-18 NOTE — Telephone Encounter (Signed)
s/w pt's son Trey Paula who is aware of stress test results w/verbal understanding today. Advised to make sure to f/u on 05/03/12 w/Dr. Rubin Payor said ok

## 2012-03-21 DIAGNOSIS — Z7901 Long term (current) use of anticoagulants: Secondary | ICD-10-CM | POA: Diagnosis not present

## 2012-03-21 DIAGNOSIS — J449 Chronic obstructive pulmonary disease, unspecified: Secondary | ICD-10-CM | POA: Diagnosis not present

## 2012-03-21 DIAGNOSIS — E119 Type 2 diabetes mellitus without complications: Secondary | ICD-10-CM | POA: Diagnosis not present

## 2012-03-22 DIAGNOSIS — J449 Chronic obstructive pulmonary disease, unspecified: Secondary | ICD-10-CM | POA: Diagnosis not present

## 2012-03-22 DIAGNOSIS — E119 Type 2 diabetes mellitus without complications: Secondary | ICD-10-CM | POA: Diagnosis not present

## 2012-03-22 DIAGNOSIS — Z7901 Long term (current) use of anticoagulants: Secondary | ICD-10-CM | POA: Diagnosis not present

## 2012-03-25 DIAGNOSIS — Z7901 Long term (current) use of anticoagulants: Secondary | ICD-10-CM | POA: Diagnosis not present

## 2012-03-25 DIAGNOSIS — J449 Chronic obstructive pulmonary disease, unspecified: Secondary | ICD-10-CM | POA: Diagnosis not present

## 2012-03-25 DIAGNOSIS — E119 Type 2 diabetes mellitus without complications: Secondary | ICD-10-CM | POA: Diagnosis not present

## 2012-03-28 ENCOUNTER — Ambulatory Visit: Payer: Medicare Other | Admitting: Physician Assistant

## 2012-03-29 DIAGNOSIS — J449 Chronic obstructive pulmonary disease, unspecified: Secondary | ICD-10-CM | POA: Diagnosis not present

## 2012-03-29 DIAGNOSIS — E119 Type 2 diabetes mellitus without complications: Secondary | ICD-10-CM | POA: Diagnosis not present

## 2012-03-29 DIAGNOSIS — Z7901 Long term (current) use of anticoagulants: Secondary | ICD-10-CM | POA: Diagnosis not present

## 2012-03-30 DIAGNOSIS — E785 Hyperlipidemia, unspecified: Secondary | ICD-10-CM | POA: Diagnosis not present

## 2012-03-30 DIAGNOSIS — E039 Hypothyroidism, unspecified: Secondary | ICD-10-CM | POA: Diagnosis not present

## 2012-03-31 DIAGNOSIS — Z7901 Long term (current) use of anticoagulants: Secondary | ICD-10-CM | POA: Diagnosis not present

## 2012-03-31 DIAGNOSIS — E119 Type 2 diabetes mellitus without complications: Secondary | ICD-10-CM | POA: Diagnosis not present

## 2012-03-31 DIAGNOSIS — Z6833 Body mass index (BMI) 33.0-33.9, adult: Secondary | ICD-10-CM | POA: Diagnosis not present

## 2012-03-31 DIAGNOSIS — J449 Chronic obstructive pulmonary disease, unspecified: Secondary | ICD-10-CM | POA: Diagnosis not present

## 2012-03-31 DIAGNOSIS — Z79899 Other long term (current) drug therapy: Secondary | ICD-10-CM | POA: Diagnosis not present

## 2012-04-02 NOTE — Assessment & Plan Note (Signed)
Recent acute exacerbation, nonspecific. She does has cardiac issues and this may be related to heart failure but she does not seem fluid overloaded. We will try treating as an exacerbation of her COPD. Plan-nebulizer treatment Depo-Medrol refill home nebulizer solution.

## 2012-04-05 DIAGNOSIS — E119 Type 2 diabetes mellitus without complications: Secondary | ICD-10-CM | POA: Diagnosis not present

## 2012-04-05 DIAGNOSIS — J449 Chronic obstructive pulmonary disease, unspecified: Secondary | ICD-10-CM | POA: Diagnosis not present

## 2012-04-05 DIAGNOSIS — Z7901 Long term (current) use of anticoagulants: Secondary | ICD-10-CM | POA: Diagnosis not present

## 2012-04-07 DIAGNOSIS — Z7901 Long term (current) use of anticoagulants: Secondary | ICD-10-CM | POA: Diagnosis not present

## 2012-04-07 DIAGNOSIS — J449 Chronic obstructive pulmonary disease, unspecified: Secondary | ICD-10-CM | POA: Diagnosis not present

## 2012-04-07 DIAGNOSIS — E119 Type 2 diabetes mellitus without complications: Secondary | ICD-10-CM | POA: Diagnosis not present

## 2012-04-08 DIAGNOSIS — E119 Type 2 diabetes mellitus without complications: Secondary | ICD-10-CM | POA: Diagnosis not present

## 2012-04-08 DIAGNOSIS — J449 Chronic obstructive pulmonary disease, unspecified: Secondary | ICD-10-CM | POA: Diagnosis not present

## 2012-04-08 DIAGNOSIS — Z7901 Long term (current) use of anticoagulants: Secondary | ICD-10-CM | POA: Diagnosis not present

## 2012-04-11 DIAGNOSIS — J449 Chronic obstructive pulmonary disease, unspecified: Secondary | ICD-10-CM | POA: Diagnosis not present

## 2012-04-12 DIAGNOSIS — Z7901 Long term (current) use of anticoagulants: Secondary | ICD-10-CM | POA: Diagnosis not present

## 2012-04-12 DIAGNOSIS — E119 Type 2 diabetes mellitus without complications: Secondary | ICD-10-CM | POA: Diagnosis not present

## 2012-04-12 DIAGNOSIS — J449 Chronic obstructive pulmonary disease, unspecified: Secondary | ICD-10-CM | POA: Diagnosis not present

## 2012-04-14 DIAGNOSIS — J449 Chronic obstructive pulmonary disease, unspecified: Secondary | ICD-10-CM | POA: Diagnosis not present

## 2012-04-14 DIAGNOSIS — Z7901 Long term (current) use of anticoagulants: Secondary | ICD-10-CM | POA: Diagnosis not present

## 2012-04-14 DIAGNOSIS — E119 Type 2 diabetes mellitus without complications: Secondary | ICD-10-CM | POA: Diagnosis not present

## 2012-04-18 DIAGNOSIS — E119 Type 2 diabetes mellitus without complications: Secondary | ICD-10-CM | POA: Diagnosis not present

## 2012-04-18 DIAGNOSIS — J449 Chronic obstructive pulmonary disease, unspecified: Secondary | ICD-10-CM | POA: Diagnosis not present

## 2012-04-18 DIAGNOSIS — Z7901 Long term (current) use of anticoagulants: Secondary | ICD-10-CM | POA: Diagnosis not present

## 2012-04-22 DIAGNOSIS — E119 Type 2 diabetes mellitus without complications: Secondary | ICD-10-CM | POA: Diagnosis not present

## 2012-04-22 DIAGNOSIS — Z7901 Long term (current) use of anticoagulants: Secondary | ICD-10-CM | POA: Diagnosis not present

## 2012-04-22 DIAGNOSIS — J449 Chronic obstructive pulmonary disease, unspecified: Secondary | ICD-10-CM | POA: Diagnosis not present

## 2012-04-25 DIAGNOSIS — E119 Type 2 diabetes mellitus without complications: Secondary | ICD-10-CM | POA: Diagnosis not present

## 2012-04-25 DIAGNOSIS — J449 Chronic obstructive pulmonary disease, unspecified: Secondary | ICD-10-CM | POA: Diagnosis not present

## 2012-04-25 DIAGNOSIS — Z7901 Long term (current) use of anticoagulants: Secondary | ICD-10-CM | POA: Diagnosis not present

## 2012-04-26 DIAGNOSIS — J449 Chronic obstructive pulmonary disease, unspecified: Secondary | ICD-10-CM | POA: Diagnosis not present

## 2012-04-26 DIAGNOSIS — Z7901 Long term (current) use of anticoagulants: Secondary | ICD-10-CM | POA: Diagnosis not present

## 2012-04-26 DIAGNOSIS — E119 Type 2 diabetes mellitus without complications: Secondary | ICD-10-CM | POA: Diagnosis not present

## 2012-04-28 DIAGNOSIS — J449 Chronic obstructive pulmonary disease, unspecified: Secondary | ICD-10-CM | POA: Diagnosis not present

## 2012-04-28 DIAGNOSIS — Z7901 Long term (current) use of anticoagulants: Secondary | ICD-10-CM | POA: Diagnosis not present

## 2012-04-28 DIAGNOSIS — E119 Type 2 diabetes mellitus without complications: Secondary | ICD-10-CM | POA: Diagnosis not present

## 2012-05-02 DIAGNOSIS — Z7901 Long term (current) use of anticoagulants: Secondary | ICD-10-CM | POA: Diagnosis not present

## 2012-05-02 DIAGNOSIS — E559 Vitamin D deficiency, unspecified: Secondary | ICD-10-CM | POA: Diagnosis not present

## 2012-05-02 DIAGNOSIS — E538 Deficiency of other specified B group vitamins: Secondary | ICD-10-CM | POA: Diagnosis not present

## 2012-05-02 DIAGNOSIS — E119 Type 2 diabetes mellitus without complications: Secondary | ICD-10-CM | POA: Diagnosis not present

## 2012-05-02 DIAGNOSIS — E039 Hypothyroidism, unspecified: Secondary | ICD-10-CM | POA: Diagnosis not present

## 2012-05-02 DIAGNOSIS — E1149 Type 2 diabetes mellitus with other diabetic neurological complication: Secondary | ICD-10-CM | POA: Diagnosis not present

## 2012-05-02 DIAGNOSIS — J449 Chronic obstructive pulmonary disease, unspecified: Secondary | ICD-10-CM | POA: Diagnosis not present

## 2012-05-02 DIAGNOSIS — I10 Essential (primary) hypertension: Secondary | ICD-10-CM | POA: Diagnosis not present

## 2012-05-03 ENCOUNTER — Ambulatory Visit (INDEPENDENT_AMBULATORY_CARE_PROVIDER_SITE_OTHER): Payer: Medicare Other | Admitting: Cardiology

## 2012-05-03 ENCOUNTER — Encounter: Payer: Self-pay | Admitting: Cardiology

## 2012-05-03 VITALS — BP 132/77 | HR 72 | Ht 65.0 in | Wt 209.0 lb

## 2012-05-03 DIAGNOSIS — Z950 Presence of cardiac pacemaker: Secondary | ICD-10-CM

## 2012-05-03 DIAGNOSIS — I4891 Unspecified atrial fibrillation: Secondary | ICD-10-CM

## 2012-05-03 DIAGNOSIS — J449 Chronic obstructive pulmonary disease, unspecified: Secondary | ICD-10-CM | POA: Diagnosis not present

## 2012-05-03 DIAGNOSIS — I509 Heart failure, unspecified: Secondary | ICD-10-CM | POA: Diagnosis not present

## 2012-05-03 DIAGNOSIS — I2510 Atherosclerotic heart disease of native coronary artery without angina pectoris: Secondary | ICD-10-CM

## 2012-05-03 DIAGNOSIS — Z7901 Long term (current) use of anticoagulants: Secondary | ICD-10-CM | POA: Diagnosis not present

## 2012-05-03 DIAGNOSIS — I10 Essential (primary) hypertension: Secondary | ICD-10-CM

## 2012-05-03 DIAGNOSIS — E119 Type 2 diabetes mellitus without complications: Secondary | ICD-10-CM | POA: Diagnosis not present

## 2012-05-03 NOTE — Assessment & Plan Note (Signed)
Blood pressure controlled. Continue present medications. 

## 2012-05-03 NOTE — Assessment & Plan Note (Signed)
Continue statin. Not on aspirin and need for Coumadin. Last Myoview appears low risk. Would be conservative given her overall medical condition.

## 2012-05-03 NOTE — Assessment & Plan Note (Signed)
followup Dr. Ladona Ridgel.

## 2012-05-03 NOTE — Progress Notes (Signed)
HPI: 79 yo female for fu of atrial fibrillation and CAD. She is s/p CABG. She also has a history of permanent atrial fibrillation and has had prior AV node ablation/pacemaker.  There was also a question of prior von Willebrand disease.  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 Echocardiogram in November of 2013 showed an ejection fraction of 35-40%. There was akinesis of the mid and distal anteroseptal myocardium. The left atrium was moderate to severely dilated. There was also moderate to severe dilatation of the right atrium and moderate reduction in RV function. There was mild mitral regurgitation. Myoview in November of 2013 showed an ejection fraction of 61%. There was mild apical ischemia. Patient last seen in Nov 2013. Since then, she has some dyspnea on exertion but no orthopnea, PND, pedal edema, syncope or chest pain.   Current Outpatient Prescriptions  Medication Sig Dispense Refill  . albuterol (PROVENTIL HFA;VENTOLIN HFA) 108 (90 BASE) MCG/ACT inhaler Inhale 2-4 puffs into the lungs every 2 (two) hours as needed for wheezing or shortness of breath.  1 Inhaler  0  . albuterol (PROVENTIL) (2.5 MG/3ML) 0.083% nebulizer solution Take 3 mLs (2.5 mg total) by nebulization every 6 (six) hours as needed for wheezing or shortness of breath. For wheezing or shortness of breath  75 mL  prn  . diltiazem (CARDIZEM CD) 120 MG 24 hr capsule Take 120 mg by mouth daily.      . ergocalciferol (VITAMIN D2) 50000 UNITS capsule Take 50,000 Units by mouth once a week.      . furosemide (LASIX) 40 MG tablet Take 40 mg by mouth daily.      Marland Kitchen guaiFENesin (MUCINEX) 600 MG 12 hr tablet Take 600-1,200 mg by mouth daily as needed. For congestion      . insulin glargine (LANTUS) 100 UNIT/ML injection Inject 60 Units into the skin at bedtime.      . insulin lispro (HUMALOG KWIKPEN) 100 UNIT/ML injection Inject 10-12 Units  into the skin 3 (three) times daily as needed. Give 10 units three times a day if blood sugar less than 225. Give 12 units three times a day if blood sugar 225 or higher.      . levothyroxine (SYNTHROID, LEVOTHROID) 200 MCG tablet Take 200 mcg by mouth daily.      . metoprolol succinate (TOPROL-XL) 100 MG 24 hr tablet Take 50 mg by mouth daily. Take with or immediately following a meal.      . nitroGLYCERIN (NITROSTAT) 0.4 MG SL tablet Place 0.4 mg under the tongue every 5 (five) minutes as needed. For chest pain.      . potassium chloride (K-DUR) 10 MEQ tablet Take 10 mEq by mouth daily.      . rosuvastatin (CRESTOR) 10 MG tablet Take 10 mg by mouth daily.      . sertraline (ZOLOFT) 100 MG tablet Take 100 mg by mouth daily.        Marland Kitchen warfarin (COUMADIN) 5 MG tablet as directed.          Past Medical History  Diagnosis Date  . Chronic combined systolic and diastolic heart failure     a. Echocardiogram 10/12: Septal and apical HK, mild focal basal hypertrophy the septum, EF 45-50%, mild MR, moderate LAE, mild RAE, PASP 39;  b. Echo 11/13: mild LVH, focal basal hypertrophy, EF 35-40%, mid to dist Ant-Septal AK, MAC, mild  MR, mod to sev BAE, mod reduced RVSF, PASP 47  . Cardiac pacemaker in situ   . Anemia, pernicious   . Atrial fibrillation     S/P AV node ablation; coumadin managed by PCP  . Von Willebrand's disease   . Gallbladder disease   . Hypothyroidism   . Diabetes mellitus   . CAD (coronary artery disease)     a. s/p CABG;  b. Myoview 11/11: inferior scar with mild peri-infarct ischemia, EF 45%;   c. Lex MV 11/13: mild apical ischemia, apical HK, EF 61%  . COPD (chronic obstructive pulmonary disease)     Past Surgical History  Procedure Date  . Coronary artery bypass graft   . Mandibular reconstruction w/ iliac bone graft   . Appendectomy   . Hernia repair     History   Social History  . Marital Status: Married    Spouse Name: N/A    Number of Children: N/A  . Years of  Education: N/A   Occupational History  . Not on file.   Social History Main Topics  . Smoking status: Former Smoker    Quit date: 03/29/1989  . Smokeless tobacco: Not on file  . Alcohol Use: No  . Drug Use: Not on file  . Sexually Active: Yes   Other Topics Concern  . Not on file   Social History Narrative   Lives with daugher    ROS: no fevers or chills, productive cough, hemoptysis, dysphasia, odynophagia, melena, hematochezia, dysuria, hematuria, rash, seizure activity, orthopnea, PND, pedal edema, claudication. Remaining systems are negative.  Physical Exam: Well-developed well-nourished in no acute distress.  Skin is warm and dry.  HEENT is normal.  Neck is supple.  Chest is clear to auscultation with normal expansion.  Cardiovascular exam is regular rate and rhythm.  Abdominal exam nontender or distended. No masses palpated. Extremities show no edema. neuro grossly intact

## 2012-05-03 NOTE — Assessment & Plan Note (Signed)
Continue present dose of diuretic. 

## 2012-05-03 NOTE — Assessment & Plan Note (Signed)
Patient has permanent atrial fibrillation. Previous documented left atrial thrombus. Continue Coumadin. She apparently is not having her INRs checked. She did have it checked recently by her primary care in [place]. I discussed the importance of following her Coumadin. I explained the risks of subtherapeutic and supratherapeutic INRs. They state they will have this followed in [place] by her primary care physician.

## 2012-05-03 NOTE — Patient Instructions (Addendum)
Your physician wants you to follow-up in: 6 MONTHS WITH DR CRENSHAW You will receive a reminder letter in the mail two months in advance. If you don't receive a letter, please call our office to schedule the follow-up appointment.  

## 2012-05-05 DIAGNOSIS — Z7901 Long term (current) use of anticoagulants: Secondary | ICD-10-CM | POA: Diagnosis not present

## 2012-05-05 DIAGNOSIS — J449 Chronic obstructive pulmonary disease, unspecified: Secondary | ICD-10-CM | POA: Diagnosis not present

## 2012-05-05 DIAGNOSIS — E119 Type 2 diabetes mellitus without complications: Secondary | ICD-10-CM | POA: Diagnosis not present

## 2012-05-06 DIAGNOSIS — Z7901 Long term (current) use of anticoagulants: Secondary | ICD-10-CM | POA: Diagnosis not present

## 2012-05-06 DIAGNOSIS — E119 Type 2 diabetes mellitus without complications: Secondary | ICD-10-CM | POA: Diagnosis not present

## 2012-05-06 DIAGNOSIS — J449 Chronic obstructive pulmonary disease, unspecified: Secondary | ICD-10-CM | POA: Diagnosis not present

## 2012-05-10 DIAGNOSIS — J449 Chronic obstructive pulmonary disease, unspecified: Secondary | ICD-10-CM | POA: Diagnosis not present

## 2012-05-10 DIAGNOSIS — Z7901 Long term (current) use of anticoagulants: Secondary | ICD-10-CM | POA: Diagnosis not present

## 2012-05-10 DIAGNOSIS — E119 Type 2 diabetes mellitus without complications: Secondary | ICD-10-CM | POA: Diagnosis not present

## 2012-05-11 DIAGNOSIS — E039 Hypothyroidism, unspecified: Secondary | ICD-10-CM | POA: Diagnosis not present

## 2012-05-11 DIAGNOSIS — E119 Type 2 diabetes mellitus without complications: Secondary | ICD-10-CM | POA: Diagnosis not present

## 2012-05-11 DIAGNOSIS — J449 Chronic obstructive pulmonary disease, unspecified: Secondary | ICD-10-CM | POA: Diagnosis not present

## 2012-05-11 DIAGNOSIS — E785 Hyperlipidemia, unspecified: Secondary | ICD-10-CM | POA: Diagnosis not present

## 2012-05-11 DIAGNOSIS — Z7901 Long term (current) use of anticoagulants: Secondary | ICD-10-CM | POA: Diagnosis not present

## 2012-05-12 DIAGNOSIS — Z7901 Long term (current) use of anticoagulants: Secondary | ICD-10-CM | POA: Diagnosis not present

## 2012-05-12 DIAGNOSIS — E119 Type 2 diabetes mellitus without complications: Secondary | ICD-10-CM | POA: Diagnosis not present

## 2012-05-12 DIAGNOSIS — J449 Chronic obstructive pulmonary disease, unspecified: Secondary | ICD-10-CM | POA: Diagnosis not present

## 2012-05-13 DIAGNOSIS — J449 Chronic obstructive pulmonary disease, unspecified: Secondary | ICD-10-CM | POA: Diagnosis not present

## 2012-05-13 DIAGNOSIS — E119 Type 2 diabetes mellitus without complications: Secondary | ICD-10-CM | POA: Diagnosis not present

## 2012-05-13 DIAGNOSIS — Z7901 Long term (current) use of anticoagulants: Secondary | ICD-10-CM | POA: Diagnosis not present

## 2012-05-16 DIAGNOSIS — R0602 Shortness of breath: Secondary | ICD-10-CM | POA: Diagnosis not present

## 2012-05-16 DIAGNOSIS — R079 Chest pain, unspecified: Secondary | ICD-10-CM | POA: Diagnosis not present

## 2012-05-16 DIAGNOSIS — R0902 Hypoxemia: Secondary | ICD-10-CM | POA: Diagnosis not present

## 2012-05-16 DIAGNOSIS — F0390 Unspecified dementia without behavioral disturbance: Secondary | ICD-10-CM | POA: Diagnosis not present

## 2012-05-16 DIAGNOSIS — I509 Heart failure, unspecified: Secondary | ICD-10-CM | POA: Diagnosis not present

## 2012-05-16 DIAGNOSIS — R05 Cough: Secondary | ICD-10-CM | POA: Diagnosis not present

## 2012-05-17 DIAGNOSIS — E119 Type 2 diabetes mellitus without complications: Secondary | ICD-10-CM | POA: Diagnosis not present

## 2012-05-17 DIAGNOSIS — Z7901 Long term (current) use of anticoagulants: Secondary | ICD-10-CM | POA: Diagnosis not present

## 2012-05-17 DIAGNOSIS — J449 Chronic obstructive pulmonary disease, unspecified: Secondary | ICD-10-CM | POA: Diagnosis not present

## 2012-05-18 DIAGNOSIS — Z79899 Other long term (current) drug therapy: Secondary | ICD-10-CM | POA: Diagnosis not present

## 2012-05-18 DIAGNOSIS — Z9981 Dependence on supplemental oxygen: Secondary | ICD-10-CM | POA: Diagnosis not present

## 2012-05-18 DIAGNOSIS — I2789 Other specified pulmonary heart diseases: Secondary | ICD-10-CM | POA: Diagnosis not present

## 2012-05-18 DIAGNOSIS — R0902 Hypoxemia: Secondary | ICD-10-CM | POA: Diagnosis not present

## 2012-05-18 DIAGNOSIS — I059 Rheumatic mitral valve disease, unspecified: Secondary | ICD-10-CM | POA: Diagnosis not present

## 2012-05-18 DIAGNOSIS — Z951 Presence of aortocoronary bypass graft: Secondary | ICD-10-CM | POA: Diagnosis not present

## 2012-05-18 DIAGNOSIS — F068 Other specified mental disorders due to known physiological condition: Secondary | ICD-10-CM | POA: Diagnosis not present

## 2012-05-18 DIAGNOSIS — E119 Type 2 diabetes mellitus without complications: Secondary | ICD-10-CM | POA: Diagnosis not present

## 2012-05-18 DIAGNOSIS — J180 Bronchopneumonia, unspecified organism: Secondary | ICD-10-CM | POA: Diagnosis not present

## 2012-05-18 DIAGNOSIS — Z794 Long term (current) use of insulin: Secondary | ICD-10-CM | POA: Diagnosis not present

## 2012-05-18 DIAGNOSIS — Z95810 Presence of automatic (implantable) cardiac defibrillator: Secondary | ICD-10-CM | POA: Diagnosis not present

## 2012-05-18 DIAGNOSIS — I369 Nonrheumatic tricuspid valve disorder, unspecified: Secondary | ICD-10-CM | POA: Diagnosis not present

## 2012-05-18 DIAGNOSIS — J441 Chronic obstructive pulmonary disease with (acute) exacerbation: Secondary | ICD-10-CM | POA: Diagnosis not present

## 2012-05-18 DIAGNOSIS — I2510 Atherosclerotic heart disease of native coronary artery without angina pectoris: Secondary | ICD-10-CM | POA: Diagnosis present

## 2012-05-18 DIAGNOSIS — I509 Heart failure, unspecified: Secondary | ICD-10-CM | POA: Diagnosis present

## 2012-05-18 DIAGNOSIS — J9600 Acute respiratory failure, unspecified whether with hypoxia or hypercapnia: Secondary | ICD-10-CM | POA: Diagnosis not present

## 2012-05-18 DIAGNOSIS — I5023 Acute on chronic systolic (congestive) heart failure: Secondary | ICD-10-CM | POA: Diagnosis not present

## 2012-05-18 DIAGNOSIS — I4891 Unspecified atrial fibrillation: Secondary | ICD-10-CM | POA: Diagnosis present

## 2012-05-18 DIAGNOSIS — E785 Hyperlipidemia, unspecified: Secondary | ICD-10-CM | POA: Diagnosis not present

## 2012-05-18 DIAGNOSIS — H3530 Unspecified macular degeneration: Secondary | ICD-10-CM | POA: Diagnosis present

## 2012-05-18 DIAGNOSIS — I10 Essential (primary) hypertension: Secondary | ICD-10-CM | POA: Diagnosis not present

## 2012-05-18 DIAGNOSIS — Z7901 Long term (current) use of anticoagulants: Secondary | ICD-10-CM | POA: Diagnosis not present

## 2012-05-18 DIAGNOSIS — I359 Nonrheumatic aortic valve disorder, unspecified: Secondary | ICD-10-CM | POA: Diagnosis not present

## 2012-05-18 DIAGNOSIS — R0602 Shortness of breath: Secondary | ICD-10-CM | POA: Diagnosis not present

## 2012-05-21 DIAGNOSIS — Z7901 Long term (current) use of anticoagulants: Secondary | ICD-10-CM | POA: Diagnosis not present

## 2012-05-21 DIAGNOSIS — J449 Chronic obstructive pulmonary disease, unspecified: Secondary | ICD-10-CM | POA: Diagnosis not present

## 2012-05-21 DIAGNOSIS — E119 Type 2 diabetes mellitus without complications: Secondary | ICD-10-CM | POA: Diagnosis not present

## 2012-05-22 DIAGNOSIS — Z951 Presence of aortocoronary bypass graft: Secondary | ICD-10-CM | POA: Diagnosis not present

## 2012-05-22 DIAGNOSIS — R05 Cough: Secondary | ICD-10-CM | POA: Diagnosis not present

## 2012-05-22 DIAGNOSIS — R609 Edema, unspecified: Secondary | ICD-10-CM | POA: Diagnosis not present

## 2012-05-22 DIAGNOSIS — E119 Type 2 diabetes mellitus without complications: Secondary | ICD-10-CM | POA: Diagnosis not present

## 2012-05-22 DIAGNOSIS — I252 Old myocardial infarction: Secondary | ICD-10-CM | POA: Diagnosis not present

## 2012-05-22 DIAGNOSIS — R1115 Cyclical vomiting syndrome unrelated to migraine: Secondary | ICD-10-CM | POA: Diagnosis not present

## 2012-05-22 DIAGNOSIS — I509 Heart failure, unspecified: Secondary | ICD-10-CM | POA: Diagnosis not present

## 2012-05-22 DIAGNOSIS — E039 Hypothyroidism, unspecified: Secondary | ICD-10-CM | POA: Diagnosis not present

## 2012-05-22 DIAGNOSIS — E7800 Pure hypercholesterolemia, unspecified: Secondary | ICD-10-CM | POA: Diagnosis not present

## 2012-05-22 DIAGNOSIS — R0602 Shortness of breath: Secondary | ICD-10-CM | POA: Diagnosis not present

## 2012-05-22 DIAGNOSIS — J4489 Other specified chronic obstructive pulmonary disease: Secondary | ICD-10-CM | POA: Diagnosis not present

## 2012-05-22 DIAGNOSIS — F0390 Unspecified dementia without behavioral disturbance: Secondary | ICD-10-CM | POA: Diagnosis not present

## 2012-05-22 DIAGNOSIS — I10 Essential (primary) hypertension: Secondary | ICD-10-CM | POA: Diagnosis not present

## 2012-05-22 DIAGNOSIS — J449 Chronic obstructive pulmonary disease, unspecified: Secondary | ICD-10-CM | POA: Diagnosis not present

## 2012-05-22 DIAGNOSIS — K219 Gastro-esophageal reflux disease without esophagitis: Secondary | ICD-10-CM | POA: Diagnosis not present

## 2012-05-22 DIAGNOSIS — I4891 Unspecified atrial fibrillation: Secondary | ICD-10-CM | POA: Diagnosis not present

## 2012-05-22 DIAGNOSIS — R0789 Other chest pain: Secondary | ICD-10-CM | POA: Diagnosis not present

## 2012-05-23 DIAGNOSIS — E119 Type 2 diabetes mellitus without complications: Secondary | ICD-10-CM | POA: Diagnosis not present

## 2012-05-23 DIAGNOSIS — Z7901 Long term (current) use of anticoagulants: Secondary | ICD-10-CM | POA: Diagnosis not present

## 2012-05-23 DIAGNOSIS — J449 Chronic obstructive pulmonary disease, unspecified: Secondary | ICD-10-CM | POA: Diagnosis not present

## 2012-05-26 DIAGNOSIS — E119 Type 2 diabetes mellitus without complications: Secondary | ICD-10-CM | POA: Diagnosis not present

## 2012-05-26 DIAGNOSIS — J449 Chronic obstructive pulmonary disease, unspecified: Secondary | ICD-10-CM | POA: Diagnosis not present

## 2012-05-26 DIAGNOSIS — Z7901 Long term (current) use of anticoagulants: Secondary | ICD-10-CM | POA: Diagnosis not present

## 2012-05-27 DIAGNOSIS — E119 Type 2 diabetes mellitus without complications: Secondary | ICD-10-CM | POA: Diagnosis not present

## 2012-05-27 DIAGNOSIS — J449 Chronic obstructive pulmonary disease, unspecified: Secondary | ICD-10-CM | POA: Diagnosis not present

## 2012-05-27 DIAGNOSIS — Z7901 Long term (current) use of anticoagulants: Secondary | ICD-10-CM | POA: Diagnosis not present

## 2012-05-30 DIAGNOSIS — F0390 Unspecified dementia without behavioral disturbance: Secondary | ICD-10-CM | POA: Diagnosis not present

## 2012-05-30 DIAGNOSIS — E119 Type 2 diabetes mellitus without complications: Secondary | ICD-10-CM | POA: Diagnosis not present

## 2012-05-30 DIAGNOSIS — I4891 Unspecified atrial fibrillation: Secondary | ICD-10-CM | POA: Diagnosis not present

## 2012-05-30 DIAGNOSIS — E1149 Type 2 diabetes mellitus with other diabetic neurological complication: Secondary | ICD-10-CM | POA: Diagnosis not present

## 2012-05-30 DIAGNOSIS — Z7901 Long term (current) use of anticoagulants: Secondary | ICD-10-CM | POA: Diagnosis not present

## 2012-05-31 DIAGNOSIS — J449 Chronic obstructive pulmonary disease, unspecified: Secondary | ICD-10-CM | POA: Diagnosis not present

## 2012-05-31 DIAGNOSIS — Z7901 Long term (current) use of anticoagulants: Secondary | ICD-10-CM | POA: Diagnosis not present

## 2012-05-31 DIAGNOSIS — E119 Type 2 diabetes mellitus without complications: Secondary | ICD-10-CM | POA: Diagnosis not present

## 2012-06-02 DIAGNOSIS — I10 Essential (primary) hypertension: Secondary | ICD-10-CM | POA: Diagnosis not present

## 2012-06-02 DIAGNOSIS — I509 Heart failure, unspecified: Secondary | ICD-10-CM | POA: Diagnosis not present

## 2012-06-02 DIAGNOSIS — E7800 Pure hypercholesterolemia, unspecified: Secondary | ICD-10-CM | POA: Diagnosis not present

## 2012-06-02 DIAGNOSIS — Z794 Long term (current) use of insulin: Secondary | ICD-10-CM | POA: Diagnosis not present

## 2012-06-02 DIAGNOSIS — E039 Hypothyroidism, unspecified: Secondary | ICD-10-CM | POA: Diagnosis not present

## 2012-06-02 DIAGNOSIS — J441 Chronic obstructive pulmonary disease with (acute) exacerbation: Secondary | ICD-10-CM | POA: Diagnosis not present

## 2012-06-02 DIAGNOSIS — K219 Gastro-esophageal reflux disease without esophagitis: Secondary | ICD-10-CM | POA: Diagnosis not present

## 2012-06-02 DIAGNOSIS — I4891 Unspecified atrial fibrillation: Secondary | ICD-10-CM | POA: Diagnosis not present

## 2012-06-02 DIAGNOSIS — Z79899 Other long term (current) drug therapy: Secondary | ICD-10-CM | POA: Diagnosis not present

## 2012-06-02 DIAGNOSIS — Z951 Presence of aortocoronary bypass graft: Secondary | ICD-10-CM | POA: Diagnosis not present

## 2012-06-02 DIAGNOSIS — R0602 Shortness of breath: Secondary | ICD-10-CM | POA: Diagnosis not present

## 2012-06-02 DIAGNOSIS — E119 Type 2 diabetes mellitus without complications: Secondary | ICD-10-CM | POA: Diagnosis not present

## 2012-06-02 DIAGNOSIS — Z950 Presence of cardiac pacemaker: Secondary | ICD-10-CM | POA: Diagnosis not present

## 2012-06-02 DIAGNOSIS — F0390 Unspecified dementia without behavioral disturbance: Secondary | ICD-10-CM | POA: Diagnosis not present

## 2012-06-03 DIAGNOSIS — E119 Type 2 diabetes mellitus without complications: Secondary | ICD-10-CM | POA: Diagnosis not present

## 2012-06-03 DIAGNOSIS — Z7901 Long term (current) use of anticoagulants: Secondary | ICD-10-CM | POA: Diagnosis not present

## 2012-06-03 DIAGNOSIS — J449 Chronic obstructive pulmonary disease, unspecified: Secondary | ICD-10-CM | POA: Diagnosis not present

## 2012-06-07 DIAGNOSIS — Z7901 Long term (current) use of anticoagulants: Secondary | ICD-10-CM | POA: Diagnosis not present

## 2012-06-07 DIAGNOSIS — E119 Type 2 diabetes mellitus without complications: Secondary | ICD-10-CM | POA: Diagnosis not present

## 2012-06-07 DIAGNOSIS — J449 Chronic obstructive pulmonary disease, unspecified: Secondary | ICD-10-CM | POA: Diagnosis not present

## 2012-06-13 DIAGNOSIS — J449 Chronic obstructive pulmonary disease, unspecified: Secondary | ICD-10-CM | POA: Diagnosis not present

## 2012-06-13 DIAGNOSIS — E119 Type 2 diabetes mellitus without complications: Secondary | ICD-10-CM | POA: Diagnosis not present

## 2012-06-13 DIAGNOSIS — Z7901 Long term (current) use of anticoagulants: Secondary | ICD-10-CM | POA: Diagnosis not present

## 2012-06-15 DIAGNOSIS — Z7901 Long term (current) use of anticoagulants: Secondary | ICD-10-CM | POA: Diagnosis not present

## 2012-06-15 DIAGNOSIS — E119 Type 2 diabetes mellitus without complications: Secondary | ICD-10-CM | POA: Diagnosis not present

## 2012-06-15 DIAGNOSIS — J449 Chronic obstructive pulmonary disease, unspecified: Secondary | ICD-10-CM | POA: Diagnosis not present

## 2012-06-17 DIAGNOSIS — Z7901 Long term (current) use of anticoagulants: Secondary | ICD-10-CM | POA: Diagnosis not present

## 2012-06-17 DIAGNOSIS — J449 Chronic obstructive pulmonary disease, unspecified: Secondary | ICD-10-CM | POA: Diagnosis not present

## 2012-06-17 DIAGNOSIS — E119 Type 2 diabetes mellitus without complications: Secondary | ICD-10-CM | POA: Diagnosis not present

## 2012-06-20 DIAGNOSIS — E119 Type 2 diabetes mellitus without complications: Secondary | ICD-10-CM | POA: Diagnosis not present

## 2012-06-20 DIAGNOSIS — Z7901 Long term (current) use of anticoagulants: Secondary | ICD-10-CM | POA: Diagnosis not present

## 2012-06-20 DIAGNOSIS — J449 Chronic obstructive pulmonary disease, unspecified: Secondary | ICD-10-CM | POA: Diagnosis not present

## 2012-06-22 DIAGNOSIS — J449 Chronic obstructive pulmonary disease, unspecified: Secondary | ICD-10-CM | POA: Diagnosis not present

## 2012-06-22 DIAGNOSIS — E119 Type 2 diabetes mellitus without complications: Secondary | ICD-10-CM | POA: Diagnosis not present

## 2012-06-27 DIAGNOSIS — Z7901 Long term (current) use of anticoagulants: Secondary | ICD-10-CM | POA: Diagnosis not present

## 2012-06-27 DIAGNOSIS — E119 Type 2 diabetes mellitus without complications: Secondary | ICD-10-CM | POA: Diagnosis not present

## 2012-06-27 DIAGNOSIS — J449 Chronic obstructive pulmonary disease, unspecified: Secondary | ICD-10-CM | POA: Diagnosis not present

## 2012-06-28 DIAGNOSIS — E119 Type 2 diabetes mellitus without complications: Secondary | ICD-10-CM | POA: Diagnosis not present

## 2012-06-28 DIAGNOSIS — J449 Chronic obstructive pulmonary disease, unspecified: Secondary | ICD-10-CM | POA: Diagnosis not present

## 2012-06-28 DIAGNOSIS — Z7901 Long term (current) use of anticoagulants: Secondary | ICD-10-CM | POA: Diagnosis not present

## 2012-06-30 DIAGNOSIS — Z7901 Long term (current) use of anticoagulants: Secondary | ICD-10-CM | POA: Diagnosis not present

## 2012-06-30 DIAGNOSIS — J449 Chronic obstructive pulmonary disease, unspecified: Secondary | ICD-10-CM | POA: Diagnosis not present

## 2012-06-30 DIAGNOSIS — E119 Type 2 diabetes mellitus without complications: Secondary | ICD-10-CM | POA: Diagnosis not present

## 2012-07-02 DIAGNOSIS — E119 Type 2 diabetes mellitus without complications: Secondary | ICD-10-CM | POA: Diagnosis not present

## 2012-07-02 DIAGNOSIS — Z7901 Long term (current) use of anticoagulants: Secondary | ICD-10-CM | POA: Diagnosis not present

## 2012-07-06 DIAGNOSIS — E119 Type 2 diabetes mellitus without complications: Secondary | ICD-10-CM | POA: Diagnosis not present

## 2012-07-06 DIAGNOSIS — Z7901 Long term (current) use of anticoagulants: Secondary | ICD-10-CM | POA: Diagnosis not present

## 2012-07-15 DIAGNOSIS — Z7901 Long term (current) use of anticoagulants: Secondary | ICD-10-CM | POA: Diagnosis not present

## 2012-07-15 DIAGNOSIS — E119 Type 2 diabetes mellitus without complications: Secondary | ICD-10-CM | POA: Diagnosis not present

## 2012-07-19 DIAGNOSIS — Z7901 Long term (current) use of anticoagulants: Secondary | ICD-10-CM | POA: Diagnosis not present

## 2012-07-19 DIAGNOSIS — E119 Type 2 diabetes mellitus without complications: Secondary | ICD-10-CM | POA: Diagnosis not present

## 2012-07-22 DIAGNOSIS — E119 Type 2 diabetes mellitus without complications: Secondary | ICD-10-CM | POA: Diagnosis not present

## 2012-07-22 DIAGNOSIS — Z7901 Long term (current) use of anticoagulants: Secondary | ICD-10-CM | POA: Diagnosis not present

## 2012-07-26 DIAGNOSIS — Z7901 Long term (current) use of anticoagulants: Secondary | ICD-10-CM | POA: Diagnosis not present

## 2012-07-26 DIAGNOSIS — E119 Type 2 diabetes mellitus without complications: Secondary | ICD-10-CM | POA: Diagnosis not present

## 2012-07-27 DIAGNOSIS — E119 Type 2 diabetes mellitus without complications: Secondary | ICD-10-CM | POA: Diagnosis not present

## 2012-07-27 DIAGNOSIS — Z7901 Long term (current) use of anticoagulants: Secondary | ICD-10-CM | POA: Diagnosis not present

## 2012-07-28 DIAGNOSIS — Z7901 Long term (current) use of anticoagulants: Secondary | ICD-10-CM | POA: Diagnosis not present

## 2012-07-28 DIAGNOSIS — E119 Type 2 diabetes mellitus without complications: Secondary | ICD-10-CM | POA: Diagnosis not present

## 2012-08-02 DIAGNOSIS — Z7901 Long term (current) use of anticoagulants: Secondary | ICD-10-CM | POA: Diagnosis not present

## 2012-08-02 DIAGNOSIS — E119 Type 2 diabetes mellitus without complications: Secondary | ICD-10-CM | POA: Diagnosis not present

## 2012-08-04 DIAGNOSIS — Z7901 Long term (current) use of anticoagulants: Secondary | ICD-10-CM | POA: Diagnosis not present

## 2012-08-04 DIAGNOSIS — E119 Type 2 diabetes mellitus without complications: Secondary | ICD-10-CM | POA: Diagnosis not present

## 2012-08-05 DIAGNOSIS — E039 Hypothyroidism, unspecified: Secondary | ICD-10-CM | POA: Diagnosis not present

## 2012-08-05 DIAGNOSIS — D510 Vitamin B12 deficiency anemia due to intrinsic factor deficiency: Secondary | ICD-10-CM | POA: Diagnosis not present

## 2012-08-05 DIAGNOSIS — E559 Vitamin D deficiency, unspecified: Secondary | ICD-10-CM | POA: Diagnosis not present

## 2012-08-09 DIAGNOSIS — Z7901 Long term (current) use of anticoagulants: Secondary | ICD-10-CM | POA: Diagnosis not present

## 2012-08-09 DIAGNOSIS — E119 Type 2 diabetes mellitus without complications: Secondary | ICD-10-CM | POA: Diagnosis not present

## 2012-08-11 DIAGNOSIS — E119 Type 2 diabetes mellitus without complications: Secondary | ICD-10-CM | POA: Diagnosis not present

## 2012-08-11 DIAGNOSIS — Z7901 Long term (current) use of anticoagulants: Secondary | ICD-10-CM | POA: Diagnosis not present

## 2012-08-17 DIAGNOSIS — Z7901 Long term (current) use of anticoagulants: Secondary | ICD-10-CM | POA: Diagnosis not present

## 2012-08-17 DIAGNOSIS — E119 Type 2 diabetes mellitus without complications: Secondary | ICD-10-CM | POA: Diagnosis not present

## 2012-08-19 DIAGNOSIS — Z5181 Encounter for therapeutic drug level monitoring: Secondary | ICD-10-CM | POA: Diagnosis not present

## 2012-08-19 DIAGNOSIS — Z7901 Long term (current) use of anticoagulants: Secondary | ICD-10-CM | POA: Diagnosis not present

## 2012-08-19 DIAGNOSIS — E119 Type 2 diabetes mellitus without complications: Secondary | ICD-10-CM | POA: Diagnosis not present

## 2012-08-30 DIAGNOSIS — E039 Hypothyroidism, unspecified: Secondary | ICD-10-CM | POA: Diagnosis not present

## 2012-08-30 DIAGNOSIS — I10 Essential (primary) hypertension: Secondary | ICD-10-CM | POA: Diagnosis not present

## 2012-08-30 DIAGNOSIS — F0280 Dementia in other diseases classified elsewhere without behavioral disturbance: Secondary | ICD-10-CM | POA: Diagnosis not present

## 2012-08-30 DIAGNOSIS — E1149 Type 2 diabetes mellitus with other diabetic neurological complication: Secondary | ICD-10-CM | POA: Diagnosis not present

## 2012-08-30 DIAGNOSIS — Z6833 Body mass index (BMI) 33.0-33.9, adult: Secondary | ICD-10-CM | POA: Diagnosis not present

## 2012-08-30 DIAGNOSIS — E538 Deficiency of other specified B group vitamins: Secondary | ICD-10-CM | POA: Diagnosis not present

## 2012-08-30 DIAGNOSIS — G309 Alzheimer's disease, unspecified: Secondary | ICD-10-CM | POA: Diagnosis not present

## 2012-08-30 DIAGNOSIS — R791 Abnormal coagulation profile: Secondary | ICD-10-CM | POA: Diagnosis not present

## 2012-08-30 DIAGNOSIS — E559 Vitamin D deficiency, unspecified: Secondary | ICD-10-CM | POA: Diagnosis not present

## 2012-09-05 DIAGNOSIS — E1149 Type 2 diabetes mellitus with other diabetic neurological complication: Secondary | ICD-10-CM | POA: Diagnosis not present

## 2012-09-05 DIAGNOSIS — R112 Nausea with vomiting, unspecified: Secondary | ICD-10-CM | POA: Diagnosis not present

## 2012-09-05 DIAGNOSIS — N390 Urinary tract infection, site not specified: Secondary | ICD-10-CM | POA: Diagnosis not present

## 2012-09-05 DIAGNOSIS — G9340 Encephalopathy, unspecified: Secondary | ICD-10-CM | POA: Diagnosis not present

## 2012-09-05 DIAGNOSIS — M545 Low back pain: Secondary | ICD-10-CM | POA: Diagnosis not present

## 2012-09-05 DIAGNOSIS — IMO0002 Reserved for concepts with insufficient information to code with codable children: Secondary | ICD-10-CM | POA: Diagnosis not present

## 2012-09-05 DIAGNOSIS — M8448XA Pathological fracture, other site, initial encounter for fracture: Secondary | ICD-10-CM | POA: Diagnosis not present

## 2012-09-05 DIAGNOSIS — E1169 Type 2 diabetes mellitus with other specified complication: Secondary | ICD-10-CM | POA: Diagnosis not present

## 2012-09-05 DIAGNOSIS — M549 Dorsalgia, unspecified: Secondary | ICD-10-CM | POA: Diagnosis not present

## 2012-09-05 DIAGNOSIS — R52 Pain, unspecified: Secondary | ICD-10-CM | POA: Diagnosis not present

## 2012-09-05 DIAGNOSIS — R627 Adult failure to thrive: Secondary | ICD-10-CM | POA: Diagnosis not present

## 2012-09-05 DIAGNOSIS — I495 Sick sinus syndrome: Secondary | ICD-10-CM | POA: Diagnosis not present

## 2012-09-05 DIAGNOSIS — J9600 Acute respiratory failure, unspecified whether with hypoxia or hypercapnia: Secondary | ICD-10-CM | POA: Diagnosis not present

## 2012-09-05 DIAGNOSIS — N83209 Unspecified ovarian cyst, unspecified side: Secondary | ICD-10-CM | POA: Diagnosis not present

## 2012-09-05 DIAGNOSIS — S335XXA Sprain of ligaments of lumbar spine, initial encounter: Secondary | ICD-10-CM | POA: Diagnosis not present

## 2012-09-05 DIAGNOSIS — A499 Bacterial infection, unspecified: Secondary | ICD-10-CM | POA: Diagnosis not present

## 2012-09-05 DIAGNOSIS — R110 Nausea: Secondary | ICD-10-CM | POA: Diagnosis not present

## 2012-09-05 DIAGNOSIS — E1142 Type 2 diabetes mellitus with diabetic polyneuropathy: Secondary | ICD-10-CM | POA: Diagnosis not present

## 2012-09-05 DIAGNOSIS — S22009A Unspecified fracture of unspecified thoracic vertebra, initial encounter for closed fracture: Secondary | ICD-10-CM | POA: Diagnosis not present

## 2012-09-05 DIAGNOSIS — X58XXXA Exposure to other specified factors, initial encounter: Secondary | ICD-10-CM | POA: Diagnosis not present

## 2012-09-05 DIAGNOSIS — I4891 Unspecified atrial fibrillation: Secondary | ICD-10-CM | POA: Diagnosis not present

## 2012-09-06 DIAGNOSIS — S335XXA Sprain of ligaments of lumbar spine, initial encounter: Secondary | ICD-10-CM | POA: Diagnosis not present

## 2012-09-06 DIAGNOSIS — E1169 Type 2 diabetes mellitus with other specified complication: Secondary | ICD-10-CM | POA: Diagnosis not present

## 2012-09-06 DIAGNOSIS — I4891 Unspecified atrial fibrillation: Secondary | ICD-10-CM | POA: Diagnosis not present

## 2012-09-06 DIAGNOSIS — X58XXXA Exposure to other specified factors, initial encounter: Secondary | ICD-10-CM | POA: Diagnosis not present

## 2012-09-06 DIAGNOSIS — E1142 Type 2 diabetes mellitus with diabetic polyneuropathy: Secondary | ICD-10-CM | POA: Diagnosis not present

## 2012-09-06 DIAGNOSIS — G9340 Encephalopathy, unspecified: Secondary | ICD-10-CM | POA: Diagnosis not present

## 2012-09-06 DIAGNOSIS — N83209 Unspecified ovarian cyst, unspecified side: Secondary | ICD-10-CM | POA: Diagnosis not present

## 2012-09-06 DIAGNOSIS — I495 Sick sinus syndrome: Secondary | ICD-10-CM | POA: Diagnosis not present

## 2012-09-06 DIAGNOSIS — E1149 Type 2 diabetes mellitus with other diabetic neurological complication: Secondary | ICD-10-CM | POA: Diagnosis not present

## 2012-09-06 DIAGNOSIS — S22009A Unspecified fracture of unspecified thoracic vertebra, initial encounter for closed fracture: Secondary | ICD-10-CM | POA: Diagnosis not present

## 2012-09-06 DIAGNOSIS — J9600 Acute respiratory failure, unspecified whether with hypoxia or hypercapnia: Secondary | ICD-10-CM | POA: Diagnosis not present

## 2012-09-07 DIAGNOSIS — E1142 Type 2 diabetes mellitus with diabetic polyneuropathy: Secondary | ICD-10-CM | POA: Diagnosis present

## 2012-09-07 DIAGNOSIS — G3184 Mild cognitive impairment, so stated: Secondary | ICD-10-CM | POA: Diagnosis present

## 2012-09-07 DIAGNOSIS — I10 Essential (primary) hypertension: Secondary | ICD-10-CM | POA: Diagnosis present

## 2012-09-07 DIAGNOSIS — IMO0002 Reserved for concepts with insufficient information to code with codable children: Secondary | ICD-10-CM | POA: Diagnosis not present

## 2012-09-07 DIAGNOSIS — J9600 Acute respiratory failure, unspecified whether with hypoxia or hypercapnia: Secondary | ICD-10-CM | POA: Diagnosis not present

## 2012-09-07 DIAGNOSIS — Z5189 Encounter for other specified aftercare: Secondary | ICD-10-CM | POA: Diagnosis not present

## 2012-09-07 DIAGNOSIS — M8448XA Pathological fracture, other site, initial encounter for fracture: Secondary | ICD-10-CM | POA: Diagnosis not present

## 2012-09-07 DIAGNOSIS — D649 Anemia, unspecified: Secondary | ICD-10-CM | POA: Diagnosis present

## 2012-09-07 DIAGNOSIS — Z9181 History of falling: Secondary | ICD-10-CM | POA: Diagnosis not present

## 2012-09-07 DIAGNOSIS — I495 Sick sinus syndrome: Secondary | ICD-10-CM | POA: Diagnosis present

## 2012-09-07 DIAGNOSIS — G9340 Encephalopathy, unspecified: Secondary | ICD-10-CM | POA: Diagnosis not present

## 2012-09-07 DIAGNOSIS — S22009A Unspecified fracture of unspecified thoracic vertebra, initial encounter for closed fracture: Secondary | ICD-10-CM | POA: Diagnosis present

## 2012-09-07 DIAGNOSIS — M810 Age-related osteoporosis without current pathological fracture: Secondary | ICD-10-CM | POA: Diagnosis not present

## 2012-09-07 DIAGNOSIS — J449 Chronic obstructive pulmonary disease, unspecified: Secondary | ICD-10-CM | POA: Diagnosis not present

## 2012-09-07 DIAGNOSIS — R269 Unspecified abnormalities of gait and mobility: Secondary | ICD-10-CM | POA: Diagnosis present

## 2012-09-07 DIAGNOSIS — G9341 Metabolic encephalopathy: Secondary | ICD-10-CM | POA: Diagnosis not present

## 2012-09-07 DIAGNOSIS — R627 Adult failure to thrive: Secondary | ICD-10-CM | POA: Diagnosis present

## 2012-09-07 DIAGNOSIS — M818 Other osteoporosis without current pathological fracture: Secondary | ICD-10-CM | POA: Diagnosis present

## 2012-09-07 DIAGNOSIS — Z79899 Other long term (current) drug therapy: Secondary | ICD-10-CM | POA: Diagnosis not present

## 2012-09-07 DIAGNOSIS — M549 Dorsalgia, unspecified: Secondary | ICD-10-CM | POA: Diagnosis not present

## 2012-09-07 DIAGNOSIS — R110 Nausea: Secondary | ICD-10-CM | POA: Diagnosis not present

## 2012-09-07 DIAGNOSIS — X58XXXA Exposure to other specified factors, initial encounter: Secondary | ICD-10-CM | POA: Diagnosis not present

## 2012-09-07 DIAGNOSIS — M545 Low back pain: Secondary | ICD-10-CM | POA: Diagnosis not present

## 2012-09-07 DIAGNOSIS — N83209 Unspecified ovarian cyst, unspecified side: Secondary | ICD-10-CM | POA: Diagnosis present

## 2012-09-07 DIAGNOSIS — E039 Hypothyroidism, unspecified: Secondary | ICD-10-CM | POA: Diagnosis present

## 2012-09-07 DIAGNOSIS — I2510 Atherosclerotic heart disease of native coronary artery without angina pectoris: Secondary | ICD-10-CM | POA: Diagnosis present

## 2012-09-07 DIAGNOSIS — N390 Urinary tract infection, site not specified: Secondary | ICD-10-CM | POA: Diagnosis not present

## 2012-09-07 DIAGNOSIS — R404 Transient alteration of awareness: Secondary | ICD-10-CM | POA: Diagnosis not present

## 2012-09-07 DIAGNOSIS — E1149 Type 2 diabetes mellitus with other diabetic neurological complication: Secondary | ICD-10-CM | POA: Diagnosis present

## 2012-09-07 DIAGNOSIS — Z794 Long term (current) use of insulin: Secondary | ICD-10-CM | POA: Diagnosis not present

## 2012-09-07 DIAGNOSIS — S335XXA Sprain of ligaments of lumbar spine, initial encounter: Secondary | ICD-10-CM | POA: Diagnosis not present

## 2012-09-07 DIAGNOSIS — E1169 Type 2 diabetes mellitus with other specified complication: Secondary | ICD-10-CM | POA: Diagnosis present

## 2012-09-07 DIAGNOSIS — K8020 Calculus of gallbladder without cholecystitis without obstruction: Secondary | ICD-10-CM | POA: Diagnosis present

## 2012-09-07 DIAGNOSIS — J4489 Other specified chronic obstructive pulmonary disease: Secondary | ICD-10-CM | POA: Diagnosis not present

## 2012-09-07 DIAGNOSIS — D509 Iron deficiency anemia, unspecified: Secondary | ICD-10-CM | POA: Diagnosis not present

## 2012-09-07 DIAGNOSIS — R262 Difficulty in walking, not elsewhere classified: Secondary | ICD-10-CM | POA: Diagnosis not present

## 2012-09-07 DIAGNOSIS — E7800 Pure hypercholesterolemia, unspecified: Secondary | ICD-10-CM | POA: Diagnosis present

## 2012-09-07 DIAGNOSIS — I4891 Unspecified atrial fibrillation: Secondary | ICD-10-CM | POA: Diagnosis present

## 2012-09-15 ENCOUNTER — Telehealth (HOSPITAL_COMMUNITY): Payer: Self-pay | Admitting: Interventional Radiology

## 2012-09-16 ENCOUNTER — Encounter: Payer: Medicare Other | Admitting: Internal Medicine

## 2012-09-16 DIAGNOSIS — D509 Iron deficiency anemia, unspecified: Secondary | ICD-10-CM | POA: Diagnosis not present

## 2012-09-16 DIAGNOSIS — G9340 Encephalopathy, unspecified: Secondary | ICD-10-CM | POA: Diagnosis not present

## 2012-09-16 DIAGNOSIS — E119 Type 2 diabetes mellitus without complications: Secondary | ICD-10-CM | POA: Diagnosis not present

## 2012-09-16 DIAGNOSIS — E1149 Type 2 diabetes mellitus with other diabetic neurological complication: Secondary | ICD-10-CM | POA: Diagnosis not present

## 2012-09-16 DIAGNOSIS — R918 Other nonspecific abnormal finding of lung field: Secondary | ICD-10-CM | POA: Diagnosis not present

## 2012-09-16 DIAGNOSIS — R262 Difficulty in walking, not elsewhere classified: Secondary | ICD-10-CM | POA: Diagnosis not present

## 2012-09-16 DIAGNOSIS — Z7901 Long term (current) use of anticoagulants: Secondary | ICD-10-CM | POA: Diagnosis not present

## 2012-09-16 DIAGNOSIS — J9600 Acute respiratory failure, unspecified whether with hypoxia or hypercapnia: Secondary | ICD-10-CM | POA: Diagnosis not present

## 2012-09-16 DIAGNOSIS — I495 Sick sinus syndrome: Secondary | ICD-10-CM | POA: Diagnosis not present

## 2012-09-16 DIAGNOSIS — E1142 Type 2 diabetes mellitus with diabetic polyneuropathy: Secondary | ICD-10-CM | POA: Diagnosis not present

## 2012-09-16 DIAGNOSIS — M810 Age-related osteoporosis without current pathological fracture: Secondary | ICD-10-CM | POA: Diagnosis not present

## 2012-09-16 DIAGNOSIS — E1169 Type 2 diabetes mellitus with other specified complication: Secondary | ICD-10-CM | POA: Diagnosis not present

## 2012-09-16 DIAGNOSIS — I77810 Thoracic aortic ectasia: Secondary | ICD-10-CM | POA: Diagnosis not present

## 2012-09-16 DIAGNOSIS — K5900 Constipation, unspecified: Secondary | ICD-10-CM | POA: Diagnosis not present

## 2012-09-16 DIAGNOSIS — Z5189 Encounter for other specified aftercare: Secondary | ICD-10-CM | POA: Diagnosis not present

## 2012-09-16 DIAGNOSIS — S22009A Unspecified fracture of unspecified thoracic vertebra, initial encounter for closed fracture: Secondary | ICD-10-CM | POA: Diagnosis not present

## 2012-09-16 DIAGNOSIS — M549 Dorsalgia, unspecified: Secondary | ICD-10-CM | POA: Diagnosis not present

## 2012-09-16 DIAGNOSIS — I509 Heart failure, unspecified: Secondary | ICD-10-CM | POA: Diagnosis not present

## 2012-09-16 DIAGNOSIS — N83209 Unspecified ovarian cyst, unspecified side: Secondary | ICD-10-CM | POA: Diagnosis not present

## 2012-09-16 DIAGNOSIS — R5381 Other malaise: Secondary | ICD-10-CM | POA: Diagnosis not present

## 2012-09-16 DIAGNOSIS — I2510 Atherosclerotic heart disease of native coronary artery without angina pectoris: Secondary | ICD-10-CM | POA: Diagnosis not present

## 2012-09-16 DIAGNOSIS — M6281 Muscle weakness (generalized): Secondary | ICD-10-CM | POA: Diagnosis not present

## 2012-09-16 DIAGNOSIS — IMO0002 Reserved for concepts with insufficient information to code with codable children: Secondary | ICD-10-CM | POA: Diagnosis not present

## 2012-09-16 DIAGNOSIS — M545 Low back pain: Secondary | ICD-10-CM | POA: Diagnosis not present

## 2012-09-16 DIAGNOSIS — I4891 Unspecified atrial fibrillation: Secondary | ICD-10-CM | POA: Diagnosis not present

## 2012-09-16 DIAGNOSIS — J811 Chronic pulmonary edema: Secondary | ICD-10-CM | POA: Diagnosis not present

## 2012-09-21 DIAGNOSIS — I4891 Unspecified atrial fibrillation: Secondary | ICD-10-CM | POA: Diagnosis not present

## 2012-09-21 DIAGNOSIS — K5900 Constipation, unspecified: Secondary | ICD-10-CM | POA: Diagnosis not present

## 2012-09-21 DIAGNOSIS — IMO0002 Reserved for concepts with insufficient information to code with codable children: Secondary | ICD-10-CM | POA: Diagnosis not present

## 2012-09-30 DIAGNOSIS — E119 Type 2 diabetes mellitus without complications: Secondary | ICD-10-CM | POA: Diagnosis not present

## 2012-09-30 DIAGNOSIS — I2510 Atherosclerotic heart disease of native coronary artery without angina pectoris: Secondary | ICD-10-CM | POA: Diagnosis not present

## 2012-09-30 DIAGNOSIS — I509 Heart failure, unspecified: Secondary | ICD-10-CM | POA: Diagnosis not present

## 2012-09-30 DIAGNOSIS — IMO0002 Reserved for concepts with insufficient information to code with codable children: Secondary | ICD-10-CM | POA: Diagnosis not present

## 2012-10-06 DIAGNOSIS — R918 Other nonspecific abnormal finding of lung field: Secondary | ICD-10-CM | POA: Diagnosis not present

## 2012-10-06 DIAGNOSIS — I77810 Thoracic aortic ectasia: Secondary | ICD-10-CM | POA: Diagnosis not present

## 2012-10-06 DIAGNOSIS — S22009A Unspecified fracture of unspecified thoracic vertebra, initial encounter for closed fracture: Secondary | ICD-10-CM | POA: Diagnosis not present

## 2012-10-26 DIAGNOSIS — IMO0002 Reserved for concepts with insufficient information to code with codable children: Secondary | ICD-10-CM | POA: Diagnosis not present

## 2012-10-26 DIAGNOSIS — E119 Type 2 diabetes mellitus without complications: Secondary | ICD-10-CM | POA: Diagnosis not present

## 2012-10-26 DIAGNOSIS — R5381 Other malaise: Secondary | ICD-10-CM | POA: Diagnosis not present

## 2012-10-26 DIAGNOSIS — I2510 Atherosclerotic heart disease of native coronary artery without angina pectoris: Secondary | ICD-10-CM | POA: Diagnosis not present

## 2012-10-29 DIAGNOSIS — Z9981 Dependence on supplemental oxygen: Secondary | ICD-10-CM | POA: Diagnosis not present

## 2012-10-29 DIAGNOSIS — I509 Heart failure, unspecified: Secondary | ICD-10-CM | POA: Diagnosis not present

## 2012-10-29 DIAGNOSIS — Z5181 Encounter for therapeutic drug level monitoring: Secondary | ICD-10-CM | POA: Diagnosis not present

## 2012-10-29 DIAGNOSIS — E1142 Type 2 diabetes mellitus with diabetic polyneuropathy: Secondary | ICD-10-CM | POA: Diagnosis not present

## 2012-10-29 DIAGNOSIS — I4891 Unspecified atrial fibrillation: Secondary | ICD-10-CM | POA: Diagnosis not present

## 2012-10-29 DIAGNOSIS — IMO0002 Reserved for concepts with insufficient information to code with codable children: Secondary | ICD-10-CM | POA: Diagnosis not present

## 2012-10-29 DIAGNOSIS — I5032 Chronic diastolic (congestive) heart failure: Secondary | ICD-10-CM | POA: Diagnosis not present

## 2012-10-29 DIAGNOSIS — Z9181 History of falling: Secondary | ICD-10-CM | POA: Diagnosis not present

## 2012-10-29 DIAGNOSIS — I2510 Atherosclerotic heart disease of native coronary artery without angina pectoris: Secondary | ICD-10-CM | POA: Diagnosis not present

## 2012-10-29 DIAGNOSIS — Z7901 Long term (current) use of anticoagulants: Secondary | ICD-10-CM | POA: Diagnosis not present

## 2012-10-29 DIAGNOSIS — Z794 Long term (current) use of insulin: Secondary | ICD-10-CM | POA: Diagnosis not present

## 2012-10-29 DIAGNOSIS — J441 Chronic obstructive pulmonary disease with (acute) exacerbation: Secondary | ICD-10-CM | POA: Diagnosis not present

## 2012-10-29 DIAGNOSIS — I10 Essential (primary) hypertension: Secondary | ICD-10-CM | POA: Diagnosis not present

## 2012-10-29 DIAGNOSIS — E1149 Type 2 diabetes mellitus with other diabetic neurological complication: Secondary | ICD-10-CM | POA: Diagnosis not present

## 2012-10-29 DIAGNOSIS — Z950 Presence of cardiac pacemaker: Secondary | ICD-10-CM | POA: Diagnosis not present

## 2012-10-29 DIAGNOSIS — G3184 Mild cognitive impairment, so stated: Secondary | ICD-10-CM | POA: Diagnosis not present

## 2012-10-31 ENCOUNTER — Other Ambulatory Visit: Payer: Self-pay | Admitting: *Deleted

## 2012-10-31 DIAGNOSIS — E1142 Type 2 diabetes mellitus with diabetic polyneuropathy: Secondary | ICD-10-CM | POA: Diagnosis not present

## 2012-10-31 DIAGNOSIS — E1149 Type 2 diabetes mellitus with other diabetic neurological complication: Secondary | ICD-10-CM | POA: Diagnosis not present

## 2012-10-31 DIAGNOSIS — I5032 Chronic diastolic (congestive) heart failure: Secondary | ICD-10-CM | POA: Diagnosis not present

## 2012-10-31 DIAGNOSIS — I509 Heart failure, unspecified: Secondary | ICD-10-CM | POA: Diagnosis not present

## 2012-10-31 DIAGNOSIS — IMO0002 Reserved for concepts with insufficient information to code with codable children: Secondary | ICD-10-CM | POA: Diagnosis not present

## 2012-10-31 DIAGNOSIS — I4891 Unspecified atrial fibrillation: Secondary | ICD-10-CM | POA: Diagnosis not present

## 2012-10-31 MED ORDER — INSULIN LISPRO 100 UNIT/ML ~~LOC~~ SOLN: 10.0000 [IU] | Freq: Three times a day (TID) | SUBCUTANEOUS | Status: DC | PRN

## 2012-10-31 NOTE — Telephone Encounter (Signed)
PT'S SON CALLED REQUESTING REFILL FOR PT.

## 2012-11-01 DIAGNOSIS — E039 Hypothyroidism, unspecified: Secondary | ICD-10-CM | POA: Diagnosis not present

## 2012-11-01 DIAGNOSIS — Z09 Encounter for follow-up examination after completed treatment for conditions other than malignant neoplasm: Secondary | ICD-10-CM | POA: Diagnosis not present

## 2012-11-01 DIAGNOSIS — Z7901 Long term (current) use of anticoagulants: Secondary | ICD-10-CM | POA: Diagnosis not present

## 2012-11-01 DIAGNOSIS — F329 Major depressive disorder, single episode, unspecified: Secondary | ICD-10-CM | POA: Diagnosis not present

## 2012-11-02 DIAGNOSIS — IMO0002 Reserved for concepts with insufficient information to code with codable children: Secondary | ICD-10-CM | POA: Diagnosis not present

## 2012-11-02 DIAGNOSIS — E1142 Type 2 diabetes mellitus with diabetic polyneuropathy: Secondary | ICD-10-CM | POA: Diagnosis not present

## 2012-11-02 DIAGNOSIS — I4891 Unspecified atrial fibrillation: Secondary | ICD-10-CM | POA: Diagnosis not present

## 2012-11-02 DIAGNOSIS — I509 Heart failure, unspecified: Secondary | ICD-10-CM | POA: Diagnosis not present

## 2012-11-02 DIAGNOSIS — I5032 Chronic diastolic (congestive) heart failure: Secondary | ICD-10-CM | POA: Diagnosis not present

## 2012-11-02 DIAGNOSIS — E1149 Type 2 diabetes mellitus with other diabetic neurological complication: Secondary | ICD-10-CM | POA: Diagnosis not present

## 2012-11-04 DIAGNOSIS — E1142 Type 2 diabetes mellitus with diabetic polyneuropathy: Secondary | ICD-10-CM | POA: Diagnosis not present

## 2012-11-04 DIAGNOSIS — IMO0002 Reserved for concepts with insufficient information to code with codable children: Secondary | ICD-10-CM | POA: Diagnosis not present

## 2012-11-04 DIAGNOSIS — I4891 Unspecified atrial fibrillation: Secondary | ICD-10-CM | POA: Diagnosis not present

## 2012-11-04 DIAGNOSIS — E1149 Type 2 diabetes mellitus with other diabetic neurological complication: Secondary | ICD-10-CM | POA: Diagnosis not present

## 2012-11-04 DIAGNOSIS — I5032 Chronic diastolic (congestive) heart failure: Secondary | ICD-10-CM | POA: Diagnosis not present

## 2012-11-04 DIAGNOSIS — I509 Heart failure, unspecified: Secondary | ICD-10-CM | POA: Diagnosis not present

## 2012-11-05 DIAGNOSIS — I4891 Unspecified atrial fibrillation: Secondary | ICD-10-CM | POA: Diagnosis not present

## 2012-11-05 DIAGNOSIS — I5032 Chronic diastolic (congestive) heart failure: Secondary | ICD-10-CM | POA: Diagnosis not present

## 2012-11-05 DIAGNOSIS — I509 Heart failure, unspecified: Secondary | ICD-10-CM | POA: Diagnosis not present

## 2012-11-05 DIAGNOSIS — E1142 Type 2 diabetes mellitus with diabetic polyneuropathy: Secondary | ICD-10-CM | POA: Diagnosis not present

## 2012-11-05 DIAGNOSIS — E1149 Type 2 diabetes mellitus with other diabetic neurological complication: Secondary | ICD-10-CM | POA: Diagnosis not present

## 2012-11-05 DIAGNOSIS — IMO0002 Reserved for concepts with insufficient information to code with codable children: Secondary | ICD-10-CM | POA: Diagnosis not present

## 2012-11-07 ENCOUNTER — Ambulatory Visit: Payer: Medicare Other | Admitting: Endocrinology

## 2012-11-08 DIAGNOSIS — IMO0002 Reserved for concepts with insufficient information to code with codable children: Secondary | ICD-10-CM | POA: Diagnosis not present

## 2012-11-08 DIAGNOSIS — E1142 Type 2 diabetes mellitus with diabetic polyneuropathy: Secondary | ICD-10-CM | POA: Diagnosis not present

## 2012-11-08 DIAGNOSIS — I5032 Chronic diastolic (congestive) heart failure: Secondary | ICD-10-CM | POA: Diagnosis not present

## 2012-11-08 DIAGNOSIS — I4891 Unspecified atrial fibrillation: Secondary | ICD-10-CM | POA: Diagnosis not present

## 2012-11-08 DIAGNOSIS — I509 Heart failure, unspecified: Secondary | ICD-10-CM | POA: Diagnosis not present

## 2012-11-08 DIAGNOSIS — E1149 Type 2 diabetes mellitus with other diabetic neurological complication: Secondary | ICD-10-CM | POA: Diagnosis not present

## 2012-11-09 DIAGNOSIS — E1149 Type 2 diabetes mellitus with other diabetic neurological complication: Secondary | ICD-10-CM | POA: Diagnosis not present

## 2012-11-09 DIAGNOSIS — E1142 Type 2 diabetes mellitus with diabetic polyneuropathy: Secondary | ICD-10-CM | POA: Diagnosis not present

## 2012-11-09 DIAGNOSIS — IMO0002 Reserved for concepts with insufficient information to code with codable children: Secondary | ICD-10-CM | POA: Diagnosis not present

## 2012-11-09 DIAGNOSIS — I4891 Unspecified atrial fibrillation: Secondary | ICD-10-CM | POA: Diagnosis not present

## 2012-11-09 DIAGNOSIS — I5032 Chronic diastolic (congestive) heart failure: Secondary | ICD-10-CM | POA: Diagnosis not present

## 2012-11-09 DIAGNOSIS — I509 Heart failure, unspecified: Secondary | ICD-10-CM | POA: Diagnosis not present

## 2012-11-10 DIAGNOSIS — I509 Heart failure, unspecified: Secondary | ICD-10-CM | POA: Diagnosis not present

## 2012-11-10 DIAGNOSIS — IMO0002 Reserved for concepts with insufficient information to code with codable children: Secondary | ICD-10-CM | POA: Diagnosis not present

## 2012-11-10 DIAGNOSIS — E1149 Type 2 diabetes mellitus with other diabetic neurological complication: Secondary | ICD-10-CM | POA: Diagnosis not present

## 2012-11-10 DIAGNOSIS — E1142 Type 2 diabetes mellitus with diabetic polyneuropathy: Secondary | ICD-10-CM | POA: Diagnosis not present

## 2012-11-10 DIAGNOSIS — I4891 Unspecified atrial fibrillation: Secondary | ICD-10-CM | POA: Diagnosis not present

## 2012-11-10 DIAGNOSIS — I5032 Chronic diastolic (congestive) heart failure: Secondary | ICD-10-CM | POA: Diagnosis not present

## 2012-11-11 ENCOUNTER — Ambulatory Visit (INDEPENDENT_AMBULATORY_CARE_PROVIDER_SITE_OTHER): Payer: Medicare Other | Admitting: Endocrinology

## 2012-11-11 ENCOUNTER — Encounter: Payer: Self-pay | Admitting: Endocrinology

## 2012-11-11 VITALS — BP 128/64 | HR 76 | Temp 98.2°F | Resp 12 | Ht 64.0 in | Wt 192.1 lb

## 2012-11-11 DIAGNOSIS — F0390 Unspecified dementia without behavioral disturbance: Secondary | ICD-10-CM

## 2012-11-11 DIAGNOSIS — E7800 Pure hypercholesterolemia, unspecified: Secondary | ICD-10-CM

## 2012-11-11 DIAGNOSIS — E039 Hypothyroidism, unspecified: Secondary | ICD-10-CM | POA: Diagnosis not present

## 2012-11-11 DIAGNOSIS — D510 Vitamin B12 deficiency anemia due to intrinsic factor deficiency: Secondary | ICD-10-CM | POA: Diagnosis not present

## 2012-11-11 DIAGNOSIS — IMO0001 Reserved for inherently not codable concepts without codable children: Secondary | ICD-10-CM | POA: Diagnosis not present

## 2012-11-11 DIAGNOSIS — E1165 Type 2 diabetes mellitus with hyperglycemia: Secondary | ICD-10-CM

## 2012-11-11 LAB — CBC WITH DIFFERENTIAL/PLATELET
Basophils Absolute: 0 10*3/uL (ref 0.0–0.1)
Basophils Relative: 0.3 % (ref 0.0–3.0)
Eosinophils Absolute: 0.1 10*3/uL (ref 0.0–0.7)
Lymphocytes Relative: 22.1 % (ref 12.0–46.0)
MCHC: 33 g/dL (ref 30.0–36.0)
MCV: 78.9 fl (ref 78.0–100.0)
Monocytes Absolute: 0.4 10*3/uL (ref 0.1–1.0)
Neutrophils Relative %: 68.4 % (ref 43.0–77.0)
Platelets: 237 10*3/uL (ref 150.0–400.0)
RDW: 22.8 % — ABNORMAL HIGH (ref 11.5–14.6)

## 2012-11-11 LAB — LIPID PANEL
Cholesterol: 126 mg/dL (ref 0–200)
VLDL: 24.4 mg/dL (ref 0.0–40.0)

## 2012-11-11 LAB — COMPREHENSIVE METABOLIC PANEL
Albumin: 3.3 g/dL — ABNORMAL LOW (ref 3.5–5.2)
BUN: 9 mg/dL (ref 6–23)
CO2: 32 mEq/L (ref 19–32)
Calcium: 8.9 mg/dL (ref 8.4–10.5)
GFR: 74.42 mL/min (ref >60.00)
Glucose, Bld: 261 mg/dL — ABNORMAL HIGH (ref 70–99)
Potassium: 4 mEq/L (ref 3.5–5.1)
Sodium: 136 mEq/L (ref 135–145)
Total Protein: 6.7 g/dL (ref 6.0–8.3)

## 2012-11-11 LAB — TSH: TSH: 3.61 u[IU]/mL (ref 0.35–5.50)

## 2012-11-11 LAB — T4, FREE: Free T4: 0.91 ng/dL (ref 0.60–1.60)

## 2012-11-11 NOTE — Progress Notes (Signed)
Patient ID: [name], female   DOB: August 21, 1924, 79 y.o.   MRN: [id number]  Reason for Appointment: Diabetes follow-up   History of Present Illness   Diagnosis: Type 2 DIABETES MELITUS, long-standing     Oral hypoglycemic drugs: None       Insulin regimen:  Lantus, once daily now 60 units, Humalog 10-12 units before meals  Proper timing of medications in relation to meals: Yes.         Monitors blood glucose: Once a day.    Glucometer: One Touch.          Blood Glucose readings: Glucometer download reviewed: readings before breakfast: 90-130  acl 100s pcs 160 Hypoglycemia frequency: Never.          Meals: 3 meals per day. on semisolid diet 3-4 weeks today tom sand bfs oatmeal         Complications: are: Neuropathy    The last HbgA1c was reported as 8%    [name] blood sugars have been poorly controlled over the last few is especially with [name] having difficulties with dementia. [name] son has been in charge of [name] insulin and glucose testing and he has not completely understood [name] insulin regimen and dosage adjustment  Although she does not need aggressive control [name] insulin may not being given consistently at meal times especially when the blood sugar is near-normal. This has been discussed on previous visits also  However [name] diet is also quite variable and she is eating inconsistent snacks and meals Until recently she had difficulties with [name] teeth and because of decreased intake [name] blood sugars were lower and [name] son reduced the Lantus to 40 units He is still not understanding how to adjust the Lantus and is adjusting it based on [name] food intake rather than fasting readings He was also told to try [name] Lantus at suppertime for more consistent dosing timing Apparently blood sugars are still somewhat higher at bedtime but since she did not bring [name] monitor difficult to know exactly how high they are     Medication List       This list is accurate as of: 11/11/12 11:59 PM.  Always use  your most recent med list.               albuterol 108 (90 BASE) MCG/ACT inhaler  Commonly known as:  PROVENTIL HFA;VENTOLIN HFA  Inhale 2-4 puffs into the lungs every 2 (two) hours as needed for wheezing or shortness of breath.     albuterol (2.5 MG/3ML) 0.083% nebulizer solution  Commonly known as:  PROVENTIL  Take 3 mLs (2.5 mg total) by nebulization every 6 (six) hours as needed for wheezing or shortness of breath. For wheezing or shortness of breath     diltiazem 120 MG 24 hr capsule  Commonly known as:  CARDIZEM CD  Take 120 mg by mouth daily.     ergocalciferol 50000 UNITS capsule  Commonly known as:  VITAMIN D2  Take 50,000 Units by mouth once a week.     furosemide 40 MG tablet  Commonly known as:  LASIX  Take 40 mg by mouth daily.     guaiFENesin 600 MG 12 hr tablet  Commonly known as:  MUCINEX  Take 600-1,200 mg by mouth daily as needed. For congestion     HUMALOG KWIKPEN [place]  Inject 12 Units into the skin 3 (three) times daily.     insulin glargine 100 UNIT/ML injection  Commonly known as:  LANTUS  Inject 60 Units into the skin at bedtime.     levothyroxine 200 MCG tablet  Commonly known as:  SYNTHROID, LEVOTHROID  Take 200 mcg by mouth daily.     metoprolol succinate 100 MG 24 hr tablet  Commonly known as:  TOPROL-XL  Take 50 mg by mouth daily. Take with or immediately following a meal.     nitroGLYCERIN 0.4 MG SL tablet  Commonly known as:  NITROSTAT  Place 0.4 mg under the tongue every 5 (five) minutes as needed. For chest pain.     potassium chloride 10 MEQ tablet  Commonly known as:  K-DUR  Take 10 mEq by mouth daily.     rosuvastatin 10 MG tablet  Commonly known as:  CRESTOR  Take 10 mg by mouth daily.     sertraline 100 MG tablet  Commonly known as:  ZOLOFT  Take 100 mg by mouth daily.     warfarin 5 MG tablet  Commonly known as:  COUMADIN  as directed.        Allergies:  Allergies  Allergen Reactions  . Carbamazepine Diarrhea   . Cefaclor     REACTION: unspecified  . Cephalexin Hives  . Clarithromycin Hives  . Dicyclomine Diarrhea and Nausea And Vomiting  . Dicyclomine Hcl     REACTION: unspecified  . Sulfonamide Derivatives Hives  . Amoxicillin-Pot Clavulanate Rash  . Sulfamethoxazole Rash    REACTION: unspecified    Past Medical History  Diagnosis Date  . Chronic combined systolic and diastolic heart failure     a. Echocardiogram 10/12: Septal and apical HK, mild focal basal hypertrophy the septum, EF 45-50%, mild MR, moderate LAE, mild RAE, PASP 39;  b. Echo 11/13: mild LVH, focal basal hypertrophy, EF 35-40%, mid to dist Ant-Septal AK, MAC, mild MR, mod to sev BAE, mod reduced RVSF, PASP 47  . Cardiac pacemaker in situ   . Anemia, pernicious   . Atrial fibrillation     S/P AV node ablation; coumadin managed by PCP  . Von Willebrand's disease   . Gallbladder disease   . Hypothyroidism   . Diabetes mellitus   . CAD (coronary artery disease)     a. s/p CABG;  b. Myoview 11/11: inferior scar with mild peri-infarct ischemia, EF 45%;   c. Lex MV 11/13: mild apical ischemia, apical HK, EF 61%  . COPD (chronic obstructive pulmonary disease)     Past Surgical History  Procedure Laterality Date  . Coronary artery bypass graft    . Mandibular reconstruction w/ iliac bone graft    . Appendectomy    . Hernia repair      No family history on file.  Social History:  reports that she quit smoking about 23 years ago. She does not have any smokeless tobacco history on file. She reports that she does not drink alcohol. [name] drug history is not on file.  Review of Systems - Cardiovascular ROS: positive for - coronary artery disease   She recently had a vertebral fracture when she fell down sideways, not clear if she has had a bone  density No history of recent pedal edema She had significant dementia Long-standing history of hypothyroidism, TSH 7.1 in April   Examination:   BP 128/64  Pulse 76   Temp(Src) 98.2 F (36.8 C)  Resp 12  Ht 5\' 4"  (1.626 m)  Wt 192 lb 1.6 oz (87.136 kg)  BMI 32.96 kg/m2  SpO2 93%  Body mass index is 32.96 kg/(m^2).  Assesment:   1. Diabetes type 2, uncontrolled - 250.02  The patient's diabetes control appears to be reasonably well-controlled as judged by home readings although usually A1c is higher when checked. Mostly having difficulties with adequate control of postprandial readings and not giving insulin at mealtimes when blood sugar is near normal. Also not currently understanding how to adjust Lantus insulin dose  2. She had inadequate supplementation with levothyroxine on the last visit in April and TSH will need to be rechecked again, she has lost about 6 pounds since then. Doses will need to be confirmed also  3. Dementia, stable  4.? Osteoporosis: Advised [name] to discuss bone density with PCP    PLAN:   To check home blood sugars on waking up or 2 hours after any meal  Add protein to breakfast, discussed various options and also have a protein at lunch if eating sandwich To not skip mealtime insulin even if blood sugar is normal Make sure blood sugars are checked in between meals or bedtime  Adjust Lantus based on fasting blood sugar every 3 days as shown on the flowchart Check thyroid levels and lipids Need to check if she has had a bone density Continue vitamin D  Counseling time over 50% of today's 25 minute visit  Lorenza Winkleman 11/15/2012, 1:50 PM   Addendum: A1c improved at 7.6, thyroid normal  Office Visit on 11/11/2012  Component Date Value Range Status  . Cholesterol 11/11/2012 126  0 - 200 mg/dL Final   ATP III Classification       Desirable:  < 200 mg/dL               Borderline High:  200 - 239 mg/dL          High:  > = 409 mg/dL  . Triglycerides 11/11/2012 122.0  0.0 - 149.0 mg/dL Final   Normal:  <811 mg/dLBorderline High:  150 - 199 mg/dL  . HDL 11/11/2012 45.40  >39.00 mg/dL Final  . VLDL 91/47/8295 24.4  0.0 -  40.0 mg/dL Final  . LDL Cholesterol 11/11/2012 56  0 - 99 mg/dL Final  . Total CHOL/HDL Ratio 11/11/2012 3   Final                  Men          Women1/2 Average Risk     3.4          3.3Average Risk          5.0          4.42X Average Risk          9.6          7.13X Average Risk          15.0          11.0                      . Sodium 11/11/2012 136  135 - 145 mEq/L Final  . Potassium 11/11/2012 4.0  3.5 - 5.1 mEq/L Final  . Chloride 11/11/2012 96  96 - 112 mEq/L Final  . CO2 11/11/2012 32  19 - 32 mEq/L Final  . Glucose, Bld 11/11/2012 261* 70 - 99 mg/dL Final  . BUN 62/13/0865 9  6 - 23 mg/dL Final  . Creatinine, Ser 11/11/2012 0.8  0.4 - 1.2 mg/dL Final  . Total Bilirubin 11/11/2012 0.5  0.3 - 1.2 mg/dL Final  . Alkaline Phosphatase 11/11/2012 105  39 - 117 U/L Final  . AST 11/11/2012 24  0 - 37 U/L Final  . ALT 11/11/2012 17  0 - 35 U/L Final  . Total Protein 11/11/2012 6.7  6.0 - 8.3 g/dL Final  . Albumin 40/98/1191 3.3* 3.5 - 5.2 g/dL Final  . Calcium 47/82/9562 8.9  8.4 - 10.5 mg/dL Final  . GFR 13/11/6576 74.42  >60.00 mL/min Final  . TSH 11/11/2012 3.61  0.35 - 5.50 uIU/mL Final  . Free T4 11/11/2012 0.91  0.60 - 1.60 ng/dL Final  . Hemoglobin I6N 11/11/2012 7.6* 4.6 - 6.5 % Final   Glycemic Control Guidelines for People with Diabetes:Non Diabetic:  <6%Goal of Therapy: <7%Additional Action Suggested:  >8%   . WBC 11/11/2012 4.8  4.5 - 10.5 K/uL Final  . RBC 11/11/2012 4.90  3.87 - 5.11 Mil/uL Final  . Hemoglobin 11/11/2012 12.8  12.0 - 15.0 g/dL Final  . HCT 62/95/2841 38.7  36.0 - 46.0 % Final  . MCV 11/11/2012 78.9  78.0 - 100.0 fl Final  . MCHC 11/11/2012 33.0  30.0 - 36.0 g/dL Final  . RDW 32/44/0102 22.8* 11.5 - 14.6 % Final  . Platelets 11/11/2012 237.0  150.0 - 400.0 K/uL Final  . Neutrophils Relative % 11/11/2012 68.4  43.0 - 77.0 % Final  . Lymphocytes Relative 11/11/2012 22.1  12.0 - 46.0 % Final  . Monocytes Relative 11/11/2012 7.8  3.0 - 12.0 % Final  .  Eosinophils Relative 11/11/2012 1.4  0.0 - 5.0 % Final  . Basophils Relative 11/11/2012 0.3  0.0 - 3.0 % Final  . Neutro Abs 11/11/2012 3.3  1.4 - 7.7 K/uL Final  . Lymphs Abs 11/11/2012 1.1  0.7 - 4.0 K/uL Final  . Monocytes Absolute 11/11/2012 0.4  0.1 - 1.0 K/uL Final  . Eosinophils Absolute 11/11/2012 0.1  0.0 - 0.7 K/uL Final  . Basophils Absolute 11/11/2012 0.0  0.0 - 0.1 K/uL Final

## 2012-11-11 NOTE — Patient Instructions (Signed)
May give Humalog right after meal if sugar < 100  Give 14-16 units for sandwiches or hi carb meals  Add protein to each meals  Reduce Lantus to 58 and adjust as directed

## 2012-11-12 DIAGNOSIS — IMO0002 Reserved for concepts with insufficient information to code with codable children: Secondary | ICD-10-CM | POA: Diagnosis not present

## 2012-11-12 DIAGNOSIS — E1149 Type 2 diabetes mellitus with other diabetic neurological complication: Secondary | ICD-10-CM | POA: Diagnosis not present

## 2012-11-12 DIAGNOSIS — E1142 Type 2 diabetes mellitus with diabetic polyneuropathy: Secondary | ICD-10-CM | POA: Diagnosis not present

## 2012-11-12 DIAGNOSIS — I4891 Unspecified atrial fibrillation: Secondary | ICD-10-CM | POA: Diagnosis not present

## 2012-11-12 DIAGNOSIS — I5032 Chronic diastolic (congestive) heart failure: Secondary | ICD-10-CM | POA: Diagnosis not present

## 2012-11-12 DIAGNOSIS — I509 Heart failure, unspecified: Secondary | ICD-10-CM | POA: Diagnosis not present

## 2012-11-14 DIAGNOSIS — E1142 Type 2 diabetes mellitus with diabetic polyneuropathy: Secondary | ICD-10-CM | POA: Diagnosis not present

## 2012-11-14 DIAGNOSIS — I509 Heart failure, unspecified: Secondary | ICD-10-CM | POA: Diagnosis not present

## 2012-11-14 DIAGNOSIS — I5032 Chronic diastolic (congestive) heart failure: Secondary | ICD-10-CM | POA: Diagnosis not present

## 2012-11-14 DIAGNOSIS — E1149 Type 2 diabetes mellitus with other diabetic neurological complication: Secondary | ICD-10-CM | POA: Diagnosis not present

## 2012-11-14 DIAGNOSIS — I4891 Unspecified atrial fibrillation: Secondary | ICD-10-CM | POA: Diagnosis not present

## 2012-11-14 DIAGNOSIS — IMO0002 Reserved for concepts with insufficient information to code with codable children: Secondary | ICD-10-CM | POA: Diagnosis not present

## 2012-11-15 DIAGNOSIS — E1149 Type 2 diabetes mellitus with other diabetic neurological complication: Secondary | ICD-10-CM | POA: Diagnosis not present

## 2012-11-15 DIAGNOSIS — F0390 Unspecified dementia without behavioral disturbance: Secondary | ICD-10-CM | POA: Insufficient documentation

## 2012-11-15 DIAGNOSIS — IMO0002 Reserved for concepts with insufficient information to code with codable children: Secondary | ICD-10-CM | POA: Diagnosis not present

## 2012-11-15 DIAGNOSIS — I4891 Unspecified atrial fibrillation: Secondary | ICD-10-CM | POA: Diagnosis not present

## 2012-11-15 DIAGNOSIS — E1142 Type 2 diabetes mellitus with diabetic polyneuropathy: Secondary | ICD-10-CM | POA: Diagnosis not present

## 2012-11-15 DIAGNOSIS — I509 Heart failure, unspecified: Secondary | ICD-10-CM | POA: Diagnosis not present

## 2012-11-15 DIAGNOSIS — I5032 Chronic diastolic (congestive) heart failure: Secondary | ICD-10-CM | POA: Diagnosis not present

## 2012-11-16 DIAGNOSIS — I4891 Unspecified atrial fibrillation: Secondary | ICD-10-CM | POA: Diagnosis not present

## 2012-11-16 DIAGNOSIS — I509 Heart failure, unspecified: Secondary | ICD-10-CM | POA: Diagnosis not present

## 2012-11-16 DIAGNOSIS — E1142 Type 2 diabetes mellitus with diabetic polyneuropathy: Secondary | ICD-10-CM | POA: Diagnosis not present

## 2012-11-16 DIAGNOSIS — IMO0002 Reserved for concepts with insufficient information to code with codable children: Secondary | ICD-10-CM | POA: Diagnosis not present

## 2012-11-16 DIAGNOSIS — E1149 Type 2 diabetes mellitus with other diabetic neurological complication: Secondary | ICD-10-CM | POA: Diagnosis not present

## 2012-11-16 DIAGNOSIS — I5032 Chronic diastolic (congestive) heart failure: Secondary | ICD-10-CM | POA: Diagnosis not present

## 2012-11-17 DIAGNOSIS — I5032 Chronic diastolic (congestive) heart failure: Secondary | ICD-10-CM | POA: Diagnosis not present

## 2012-11-17 DIAGNOSIS — IMO0002 Reserved for concepts with insufficient information to code with codable children: Secondary | ICD-10-CM | POA: Diagnosis not present

## 2012-11-17 DIAGNOSIS — E1149 Type 2 diabetes mellitus with other diabetic neurological complication: Secondary | ICD-10-CM | POA: Diagnosis not present

## 2012-11-17 DIAGNOSIS — I4891 Unspecified atrial fibrillation: Secondary | ICD-10-CM | POA: Diagnosis not present

## 2012-11-17 DIAGNOSIS — E1142 Type 2 diabetes mellitus with diabetic polyneuropathy: Secondary | ICD-10-CM | POA: Diagnosis not present

## 2012-11-17 DIAGNOSIS — I509 Heart failure, unspecified: Secondary | ICD-10-CM | POA: Diagnosis not present

## 2012-11-21 DIAGNOSIS — E1142 Type 2 diabetes mellitus with diabetic polyneuropathy: Secondary | ICD-10-CM | POA: Diagnosis not present

## 2012-11-21 DIAGNOSIS — I509 Heart failure, unspecified: Secondary | ICD-10-CM | POA: Diagnosis not present

## 2012-11-21 DIAGNOSIS — E1149 Type 2 diabetes mellitus with other diabetic neurological complication: Secondary | ICD-10-CM | POA: Diagnosis not present

## 2012-11-21 DIAGNOSIS — I4891 Unspecified atrial fibrillation: Secondary | ICD-10-CM | POA: Diagnosis not present

## 2012-11-21 DIAGNOSIS — IMO0002 Reserved for concepts with insufficient information to code with codable children: Secondary | ICD-10-CM | POA: Diagnosis not present

## 2012-11-21 DIAGNOSIS — I5032 Chronic diastolic (congestive) heart failure: Secondary | ICD-10-CM | POA: Diagnosis not present

## 2012-11-22 ENCOUNTER — Encounter: Payer: Self-pay | Admitting: *Deleted

## 2012-11-22 DIAGNOSIS — IMO0002 Reserved for concepts with insufficient information to code with codable children: Secondary | ICD-10-CM | POA: Diagnosis not present

## 2012-11-22 DIAGNOSIS — E1142 Type 2 diabetes mellitus with diabetic polyneuropathy: Secondary | ICD-10-CM | POA: Diagnosis not present

## 2012-11-22 DIAGNOSIS — E1149 Type 2 diabetes mellitus with other diabetic neurological complication: Secondary | ICD-10-CM | POA: Diagnosis not present

## 2012-11-22 DIAGNOSIS — I509 Heart failure, unspecified: Secondary | ICD-10-CM | POA: Diagnosis not present

## 2012-11-22 DIAGNOSIS — I5032 Chronic diastolic (congestive) heart failure: Secondary | ICD-10-CM | POA: Diagnosis not present

## 2012-11-22 DIAGNOSIS — I4891 Unspecified atrial fibrillation: Secondary | ICD-10-CM | POA: Diagnosis not present

## 2012-11-23 DIAGNOSIS — I509 Heart failure, unspecified: Secondary | ICD-10-CM | POA: Diagnosis not present

## 2012-11-23 DIAGNOSIS — I4891 Unspecified atrial fibrillation: Secondary | ICD-10-CM | POA: Diagnosis not present

## 2012-11-23 DIAGNOSIS — I5032 Chronic diastolic (congestive) heart failure: Secondary | ICD-10-CM | POA: Diagnosis not present

## 2012-11-23 DIAGNOSIS — E1149 Type 2 diabetes mellitus with other diabetic neurological complication: Secondary | ICD-10-CM | POA: Diagnosis not present

## 2012-11-23 DIAGNOSIS — IMO0002 Reserved for concepts with insufficient information to code with codable children: Secondary | ICD-10-CM | POA: Diagnosis not present

## 2012-11-23 DIAGNOSIS — E1142 Type 2 diabetes mellitus with diabetic polyneuropathy: Secondary | ICD-10-CM | POA: Diagnosis not present

## 2012-11-24 DIAGNOSIS — I5032 Chronic diastolic (congestive) heart failure: Secondary | ICD-10-CM | POA: Diagnosis not present

## 2012-11-24 DIAGNOSIS — E1142 Type 2 diabetes mellitus with diabetic polyneuropathy: Secondary | ICD-10-CM | POA: Diagnosis not present

## 2012-11-24 DIAGNOSIS — I4891 Unspecified atrial fibrillation: Secondary | ICD-10-CM | POA: Diagnosis not present

## 2012-11-24 DIAGNOSIS — IMO0002 Reserved for concepts with insufficient information to code with codable children: Secondary | ICD-10-CM | POA: Diagnosis not present

## 2012-11-24 DIAGNOSIS — E1149 Type 2 diabetes mellitus with other diabetic neurological complication: Secondary | ICD-10-CM | POA: Diagnosis not present

## 2012-11-24 DIAGNOSIS — I509 Heart failure, unspecified: Secondary | ICD-10-CM | POA: Diagnosis not present

## 2012-11-25 DIAGNOSIS — I4891 Unspecified atrial fibrillation: Secondary | ICD-10-CM | POA: Diagnosis not present

## 2012-11-25 DIAGNOSIS — IMO0002 Reserved for concepts with insufficient information to code with codable children: Secondary | ICD-10-CM | POA: Diagnosis not present

## 2012-11-25 DIAGNOSIS — I5032 Chronic diastolic (congestive) heart failure: Secondary | ICD-10-CM | POA: Diagnosis not present

## 2012-11-25 DIAGNOSIS — E1142 Type 2 diabetes mellitus with diabetic polyneuropathy: Secondary | ICD-10-CM | POA: Diagnosis not present

## 2012-11-25 DIAGNOSIS — I509 Heart failure, unspecified: Secondary | ICD-10-CM | POA: Diagnosis not present

## 2012-11-25 DIAGNOSIS — E1149 Type 2 diabetes mellitus with other diabetic neurological complication: Secondary | ICD-10-CM | POA: Diagnosis not present

## 2012-11-28 DIAGNOSIS — E1149 Type 2 diabetes mellitus with other diabetic neurological complication: Secondary | ICD-10-CM | POA: Diagnosis not present

## 2012-11-28 DIAGNOSIS — I4891 Unspecified atrial fibrillation: Secondary | ICD-10-CM | POA: Diagnosis not present

## 2012-11-28 DIAGNOSIS — E1142 Type 2 diabetes mellitus with diabetic polyneuropathy: Secondary | ICD-10-CM | POA: Diagnosis not present

## 2012-11-28 DIAGNOSIS — I509 Heart failure, unspecified: Secondary | ICD-10-CM | POA: Diagnosis not present

## 2012-11-28 DIAGNOSIS — IMO0002 Reserved for concepts with insufficient information to code with codable children: Secondary | ICD-10-CM | POA: Diagnosis not present

## 2012-11-28 DIAGNOSIS — I5032 Chronic diastolic (congestive) heart failure: Secondary | ICD-10-CM | POA: Diagnosis not present

## 2012-11-30 DIAGNOSIS — E1142 Type 2 diabetes mellitus with diabetic polyneuropathy: Secondary | ICD-10-CM | POA: Diagnosis not present

## 2012-11-30 DIAGNOSIS — E1149 Type 2 diabetes mellitus with other diabetic neurological complication: Secondary | ICD-10-CM | POA: Diagnosis not present

## 2012-11-30 DIAGNOSIS — IMO0002 Reserved for concepts with insufficient information to code with codable children: Secondary | ICD-10-CM | POA: Diagnosis not present

## 2012-11-30 DIAGNOSIS — I4891 Unspecified atrial fibrillation: Secondary | ICD-10-CM | POA: Diagnosis not present

## 2012-11-30 DIAGNOSIS — I5032 Chronic diastolic (congestive) heart failure: Secondary | ICD-10-CM | POA: Diagnosis not present

## 2012-11-30 DIAGNOSIS — I509 Heart failure, unspecified: Secondary | ICD-10-CM | POA: Diagnosis not present

## 2012-12-01 DIAGNOSIS — E1142 Type 2 diabetes mellitus with diabetic polyneuropathy: Secondary | ICD-10-CM | POA: Diagnosis not present

## 2012-12-01 DIAGNOSIS — I5032 Chronic diastolic (congestive) heart failure: Secondary | ICD-10-CM | POA: Diagnosis not present

## 2012-12-01 DIAGNOSIS — E1149 Type 2 diabetes mellitus with other diabetic neurological complication: Secondary | ICD-10-CM | POA: Diagnosis not present

## 2012-12-01 DIAGNOSIS — I4891 Unspecified atrial fibrillation: Secondary | ICD-10-CM | POA: Diagnosis not present

## 2012-12-01 DIAGNOSIS — I509 Heart failure, unspecified: Secondary | ICD-10-CM | POA: Diagnosis not present

## 2012-12-01 DIAGNOSIS — IMO0002 Reserved for concepts with insufficient information to code with codable children: Secondary | ICD-10-CM | POA: Diagnosis not present

## 2012-12-07 DIAGNOSIS — E1142 Type 2 diabetes mellitus with diabetic polyneuropathy: Secondary | ICD-10-CM | POA: Diagnosis not present

## 2012-12-07 DIAGNOSIS — E1149 Type 2 diabetes mellitus with other diabetic neurological complication: Secondary | ICD-10-CM | POA: Diagnosis not present

## 2012-12-07 DIAGNOSIS — IMO0002 Reserved for concepts with insufficient information to code with codable children: Secondary | ICD-10-CM | POA: Diagnosis not present

## 2012-12-07 DIAGNOSIS — I4891 Unspecified atrial fibrillation: Secondary | ICD-10-CM | POA: Diagnosis not present

## 2012-12-07 DIAGNOSIS — I5032 Chronic diastolic (congestive) heart failure: Secondary | ICD-10-CM | POA: Diagnosis not present

## 2012-12-07 DIAGNOSIS — I509 Heart failure, unspecified: Secondary | ICD-10-CM | POA: Diagnosis not present

## 2012-12-08 DIAGNOSIS — E1149 Type 2 diabetes mellitus with other diabetic neurological complication: Secondary | ICD-10-CM | POA: Diagnosis not present

## 2012-12-08 DIAGNOSIS — IMO0002 Reserved for concepts with insufficient information to code with codable children: Secondary | ICD-10-CM | POA: Diagnosis not present

## 2012-12-08 DIAGNOSIS — E1142 Type 2 diabetes mellitus with diabetic polyneuropathy: Secondary | ICD-10-CM | POA: Diagnosis not present

## 2012-12-08 DIAGNOSIS — I5032 Chronic diastolic (congestive) heart failure: Secondary | ICD-10-CM | POA: Diagnosis not present

## 2012-12-08 DIAGNOSIS — I509 Heart failure, unspecified: Secondary | ICD-10-CM | POA: Diagnosis not present

## 2012-12-08 DIAGNOSIS — I4891 Unspecified atrial fibrillation: Secondary | ICD-10-CM | POA: Diagnosis not present

## 2012-12-14 DIAGNOSIS — IMO0002 Reserved for concepts with insufficient information to code with codable children: Secondary | ICD-10-CM | POA: Diagnosis not present

## 2012-12-14 DIAGNOSIS — E1142 Type 2 diabetes mellitus with diabetic polyneuropathy: Secondary | ICD-10-CM | POA: Diagnosis not present

## 2012-12-14 DIAGNOSIS — E1149 Type 2 diabetes mellitus with other diabetic neurological complication: Secondary | ICD-10-CM | POA: Diagnosis not present

## 2012-12-14 DIAGNOSIS — I509 Heart failure, unspecified: Secondary | ICD-10-CM | POA: Diagnosis not present

## 2012-12-14 DIAGNOSIS — I4891 Unspecified atrial fibrillation: Secondary | ICD-10-CM | POA: Diagnosis not present

## 2012-12-14 DIAGNOSIS — I5032 Chronic diastolic (congestive) heart failure: Secondary | ICD-10-CM | POA: Diagnosis not present

## 2012-12-15 DIAGNOSIS — I5032 Chronic diastolic (congestive) heart failure: Secondary | ICD-10-CM | POA: Diagnosis not present

## 2012-12-15 DIAGNOSIS — IMO0002 Reserved for concepts with insufficient information to code with codable children: Secondary | ICD-10-CM | POA: Diagnosis not present

## 2012-12-15 DIAGNOSIS — I4891 Unspecified atrial fibrillation: Secondary | ICD-10-CM | POA: Diagnosis not present

## 2012-12-15 DIAGNOSIS — E1149 Type 2 diabetes mellitus with other diabetic neurological complication: Secondary | ICD-10-CM | POA: Diagnosis not present

## 2012-12-15 DIAGNOSIS — I509 Heart failure, unspecified: Secondary | ICD-10-CM | POA: Diagnosis not present

## 2012-12-15 DIAGNOSIS — E1142 Type 2 diabetes mellitus with diabetic polyneuropathy: Secondary | ICD-10-CM | POA: Diagnosis not present

## 2012-12-22 DIAGNOSIS — E1149 Type 2 diabetes mellitus with other diabetic neurological complication: Secondary | ICD-10-CM | POA: Diagnosis not present

## 2012-12-22 DIAGNOSIS — IMO0002 Reserved for concepts with insufficient information to code with codable children: Secondary | ICD-10-CM | POA: Diagnosis not present

## 2012-12-22 DIAGNOSIS — I5032 Chronic diastolic (congestive) heart failure: Secondary | ICD-10-CM | POA: Diagnosis not present

## 2012-12-22 DIAGNOSIS — E1142 Type 2 diabetes mellitus with diabetic polyneuropathy: Secondary | ICD-10-CM | POA: Diagnosis not present

## 2012-12-22 DIAGNOSIS — I4891 Unspecified atrial fibrillation: Secondary | ICD-10-CM | POA: Diagnosis not present

## 2012-12-22 DIAGNOSIS — I509 Heart failure, unspecified: Secondary | ICD-10-CM | POA: Diagnosis not present

## 2013-01-21 DIAGNOSIS — S9000XA Contusion of unspecified ankle, initial encounter: Secondary | ICD-10-CM | POA: Diagnosis not present

## 2013-01-21 DIAGNOSIS — M549 Dorsalgia, unspecified: Secondary | ICD-10-CM | POA: Diagnosis not present

## 2013-01-21 DIAGNOSIS — I4891 Unspecified atrial fibrillation: Secondary | ICD-10-CM | POA: Diagnosis not present

## 2013-01-21 DIAGNOSIS — F0390 Unspecified dementia without behavioral disturbance: Secondary | ICD-10-CM | POA: Diagnosis not present

## 2013-01-21 DIAGNOSIS — S22009A Unspecified fracture of unspecified thoracic vertebra, initial encounter for closed fracture: Secondary | ICD-10-CM | POA: Diagnosis not present

## 2013-01-21 DIAGNOSIS — K219 Gastro-esophageal reflux disease without esophagitis: Secondary | ICD-10-CM | POA: Diagnosis not present

## 2013-01-21 DIAGNOSIS — S7000XA Contusion of unspecified hip, initial encounter: Secondary | ICD-10-CM | POA: Diagnosis not present

## 2013-01-21 DIAGNOSIS — J189 Pneumonia, unspecified organism: Secondary | ICD-10-CM | POA: Diagnosis not present

## 2013-01-21 DIAGNOSIS — E119 Type 2 diabetes mellitus without complications: Secondary | ICD-10-CM | POA: Diagnosis not present

## 2013-01-21 DIAGNOSIS — S20219A Contusion of unspecified front wall of thorax, initial encounter: Secondary | ICD-10-CM | POA: Diagnosis not present

## 2013-01-21 DIAGNOSIS — J441 Chronic obstructive pulmonary disease with (acute) exacerbation: Secondary | ICD-10-CM | POA: Diagnosis not present

## 2013-01-21 DIAGNOSIS — M7989 Other specified soft tissue disorders: Secondary | ICD-10-CM | POA: Diagnosis not present

## 2013-01-21 DIAGNOSIS — I509 Heart failure, unspecified: Secondary | ICD-10-CM | POA: Diagnosis not present

## 2013-01-21 DIAGNOSIS — M79609 Pain in unspecified limb: Secondary | ICD-10-CM | POA: Diagnosis not present

## 2013-01-24 DIAGNOSIS — J441 Chronic obstructive pulmonary disease with (acute) exacerbation: Secondary | ICD-10-CM | POA: Diagnosis not present

## 2013-01-24 DIAGNOSIS — Z8719 Personal history of other diseases of the digestive system: Secondary | ICD-10-CM | POA: Diagnosis not present

## 2013-01-24 DIAGNOSIS — I4891 Unspecified atrial fibrillation: Secondary | ICD-10-CM | POA: Diagnosis not present

## 2013-01-24 DIAGNOSIS — Z950 Presence of cardiac pacemaker: Secondary | ICD-10-CM | POA: Diagnosis not present

## 2013-01-24 DIAGNOSIS — J449 Chronic obstructive pulmonary disease, unspecified: Secondary | ICD-10-CM | POA: Diagnosis not present

## 2013-01-24 DIAGNOSIS — Z862 Personal history of diseases of the blood and blood-forming organs and certain disorders involving the immune mechanism: Secondary | ICD-10-CM | POA: Diagnosis not present

## 2013-01-24 DIAGNOSIS — R0902 Hypoxemia: Secondary | ICD-10-CM | POA: Diagnosis not present

## 2013-01-24 DIAGNOSIS — I5042 Chronic combined systolic (congestive) and diastolic (congestive) heart failure: Secondary | ICD-10-CM | POA: Diagnosis not present

## 2013-01-24 DIAGNOSIS — E785 Hyperlipidemia, unspecified: Secondary | ICD-10-CM | POA: Diagnosis present

## 2013-01-24 DIAGNOSIS — Z7901 Long term (current) use of anticoagulants: Secondary | ICD-10-CM | POA: Diagnosis not present

## 2013-01-24 DIAGNOSIS — J180 Bronchopneumonia, unspecified organism: Secondary | ICD-10-CM | POA: Diagnosis not present

## 2013-01-24 DIAGNOSIS — K219 Gastro-esophageal reflux disease without esophagitis: Secondary | ICD-10-CM | POA: Diagnosis present

## 2013-01-24 DIAGNOSIS — Z87891 Personal history of nicotine dependence: Secondary | ICD-10-CM | POA: Diagnosis not present

## 2013-01-24 DIAGNOSIS — F0390 Unspecified dementia without behavioral disturbance: Secondary | ICD-10-CM | POA: Diagnosis not present

## 2013-01-24 DIAGNOSIS — Z794 Long term (current) use of insulin: Secondary | ICD-10-CM | POA: Diagnosis not present

## 2013-01-24 DIAGNOSIS — F0280 Dementia in other diseases classified elsewhere without behavioral disturbance: Secondary | ICD-10-CM | POA: Diagnosis not present

## 2013-01-24 DIAGNOSIS — Z23 Encounter for immunization: Secondary | ICD-10-CM | POA: Diagnosis not present

## 2013-01-24 DIAGNOSIS — J189 Pneumonia, unspecified organism: Secondary | ICD-10-CM | POA: Diagnosis present

## 2013-01-24 DIAGNOSIS — Z79899 Other long term (current) drug therapy: Secondary | ICD-10-CM | POA: Diagnosis not present

## 2013-01-24 DIAGNOSIS — E119 Type 2 diabetes mellitus without complications: Secondary | ICD-10-CM | POA: Diagnosis present

## 2013-01-24 DIAGNOSIS — J45901 Unspecified asthma with (acute) exacerbation: Secondary | ICD-10-CM | POA: Diagnosis present

## 2013-01-24 DIAGNOSIS — Z951 Presence of aortocoronary bypass graft: Secondary | ICD-10-CM | POA: Diagnosis not present

## 2013-01-24 DIAGNOSIS — R0602 Shortness of breath: Secondary | ICD-10-CM | POA: Diagnosis not present

## 2013-01-24 DIAGNOSIS — M7989 Other specified soft tissue disorders: Secondary | ICD-10-CM | POA: Diagnosis present

## 2013-01-24 DIAGNOSIS — I2510 Atherosclerotic heart disease of native coronary artery without angina pectoris: Secondary | ICD-10-CM | POA: Diagnosis present

## 2013-01-24 DIAGNOSIS — E039 Hypothyroidism, unspecified: Secondary | ICD-10-CM | POA: Diagnosis present

## 2013-01-24 DIAGNOSIS — I509 Heart failure, unspecified: Secondary | ICD-10-CM | POA: Diagnosis present

## 2013-01-24 DIAGNOSIS — I10 Essential (primary) hypertension: Secondary | ICD-10-CM | POA: Diagnosis present

## 2013-01-24 DIAGNOSIS — J159 Unspecified bacterial pneumonia: Secondary | ICD-10-CM | POA: Diagnosis not present

## 2013-01-26 ENCOUNTER — Telehealth: Payer: Self-pay | Admitting: Internal Medicine

## 2013-01-26 ENCOUNTER — Telehealth: Payer: Self-pay | Admitting: Emergency Medicine

## 2013-01-26 NOTE — Telephone Encounter (Signed)
Spoke with pt's daughter and advised of Dr Roxy Cedar recommendations.  She verbalized understanding.

## 2013-01-26 NOTE — Telephone Encounter (Signed)
Pt was admitted to [facility] on 01-24-13 for Pneumonia and wants to be transferred to [facility] so Dr Maple Hudson can take care of her.  Please advise.

## 2013-01-26 NOTE — Telephone Encounter (Signed)
Reviewed case with Dr Art Buff at [place]. [name] was admitted with an apparent CAP, has been responding well to therapy, is doing well. Family has requested transfer to [facility], but both Dr Art Buff and I believe that she can and is getting what she needs there. We will reassure her and her family that this is the case. I will make her a quick f/u OV with Dr Maple Hudson > 01/30/13 at 15:00.

## 2013-01-26 NOTE — Telephone Encounter (Signed)
Her doctor at [facility] would need to discuss any transfer request with the hospital docs through [facility].

## 2013-01-27 ENCOUNTER — Emergency Department (HOSPITAL_COMMUNITY)
Admission: EM | Admit: 2013-01-27 | Discharge: 2013-01-27 | Disposition: A | Discharge status: Home / Self Care | Payer: Medicare Other | Attending: Emergency Medicine | Admitting: Emergency Medicine

## 2013-01-27 ENCOUNTER — Encounter (HOSPITAL_COMMUNITY): Payer: Self-pay | Admitting: Emergency Medicine

## 2013-01-27 ENCOUNTER — Emergency Department (HOSPITAL_COMMUNITY): Payer: Medicare Other

## 2013-01-27 DIAGNOSIS — Z862 Personal history of diseases of the blood and blood-forming organs and certain disorders involving the immune mechanism: Secondary | ICD-10-CM | POA: Insufficient documentation

## 2013-01-27 DIAGNOSIS — Z79899 Other long term (current) drug therapy: Secondary | ICD-10-CM | POA: Insufficient documentation

## 2013-01-27 DIAGNOSIS — Z951 Presence of aortocoronary bypass graft: Secondary | ICD-10-CM | POA: Insufficient documentation

## 2013-01-27 DIAGNOSIS — I4891 Unspecified atrial fibrillation: Secondary | ICD-10-CM | POA: Insufficient documentation

## 2013-01-27 DIAGNOSIS — I2510 Atherosclerotic heart disease of native coronary artery without angina pectoris: Secondary | ICD-10-CM | POA: Insufficient documentation

## 2013-01-27 DIAGNOSIS — Z950 Presence of cardiac pacemaker: Secondary | ICD-10-CM | POA: Insufficient documentation

## 2013-01-27 DIAGNOSIS — I509 Heart failure, unspecified: Secondary | ICD-10-CM | POA: Diagnosis not present

## 2013-01-27 DIAGNOSIS — Z8719 Personal history of other diseases of the digestive system: Secondary | ICD-10-CM | POA: Insufficient documentation

## 2013-01-27 DIAGNOSIS — E119 Type 2 diabetes mellitus without complications: Secondary | ICD-10-CM | POA: Insufficient documentation

## 2013-01-27 DIAGNOSIS — J441 Chronic obstructive pulmonary disease with (acute) exacerbation: Secondary | ICD-10-CM | POA: Insufficient documentation

## 2013-01-27 DIAGNOSIS — J189 Pneumonia, unspecified organism: Secondary | ICD-10-CM | POA: Diagnosis not present

## 2013-01-27 DIAGNOSIS — R0600 Dyspnea, unspecified: Secondary | ICD-10-CM

## 2013-01-27 DIAGNOSIS — R0602 Shortness of breath: Secondary | ICD-10-CM | POA: Diagnosis not present

## 2013-01-27 DIAGNOSIS — J159 Unspecified bacterial pneumonia: Secondary | ICD-10-CM | POA: Insufficient documentation

## 2013-01-27 DIAGNOSIS — I5042 Chronic combined systolic (congestive) and diastolic (congestive) heart failure: Secondary | ICD-10-CM | POA: Insufficient documentation

## 2013-01-27 DIAGNOSIS — Z794 Long term (current) use of insulin: Secondary | ICD-10-CM | POA: Insufficient documentation

## 2013-01-27 DIAGNOSIS — E039 Hypothyroidism, unspecified: Secondary | ICD-10-CM | POA: Insufficient documentation

## 2013-01-27 DIAGNOSIS — Z87891 Personal history of nicotine dependence: Secondary | ICD-10-CM | POA: Insufficient documentation

## 2013-01-27 LAB — URINALYSIS, ROUTINE W REFLEX MICROSCOPIC
Bilirubin Urine: NEGATIVE
Glucose, UA: 500 mg/dL — AB
Hgb urine dipstick: NEGATIVE
Ketones, ur: NEGATIVE mg/dL
Leukocytes, UA: NEGATIVE
Nitrite: NEGATIVE
pH: 5.5 (ref 5.0–8.0)

## 2013-01-27 LAB — PRO B NATRIURETIC PEPTIDE: Pro B Natriuretic peptide (BNP): 4716 pg/mL — ABNORMAL HIGH (ref 0–450)

## 2013-01-27 LAB — BASIC METABOLIC PANEL
BUN: 28 mg/dL — ABNORMAL HIGH (ref 6–23)
GFR calc Af Amer: 62 mL/min — ABNORMAL LOW (ref >90)
GFR calc non Af Amer: 53 mL/min — ABNORMAL LOW (ref >90)
Potassium: 4.1 mEq/L (ref 3.5–5.1)

## 2013-01-27 LAB — CBC
Hemoglobin: 12.3 g/dL (ref 12.0–15.0)
MCHC: 33 g/dL (ref 30.0–36.0)
RDW: 17.2 % — ABNORMAL HIGH (ref 11.5–15.5)
WBC: 8.1 10*3/uL (ref 4.0–10.5)

## 2013-01-27 LAB — POCT I-STAT TROPONIN I: Troponin i, poc: 0.01 ng/mL (ref 0.00–0.08)

## 2013-01-27 LAB — PROTIME-INR: INR: 2.55 — ABNORMAL HIGH (ref 0.00–1.49)

## 2013-01-27 MED ORDER — FUROSEMIDE 40 MG PO TABS: 40.0000 mg | ORAL_TABLET | Freq: Two times a day (BID) | ORAL | Status: AC

## 2013-01-27 MED ORDER — FUROSEMIDE 20 MG PO TABS
40.0000 mg | ORAL_TABLET | Freq: Once | ORAL | Status: AC
  Administered 2013-01-27: 40 mg via ORAL
  Filled 2013-01-27: qty 2

## 2013-01-27 NOTE — ED Notes (Signed)
Per EMS pt here from home where she was D/C this afternoon from [facility] hospital admission for pneumonia. Pt here c/o SOB x1-2 weeks and CP that increases with breathing. Pt did not get prescription antibiotics filled yet. NAD noted at this time. VSS. Pt has hx of dementia, COPD, pacemaker, CABG, and O2 dependant on 2L [place] at home.

## 2013-01-27 NOTE — ED Provider Notes (Signed)
Level V caveat dementia. Patient released from [facility] today she was treated for pneumonia. She presents here complaining of shortness of breath. On exam she speaks in paragraphs. No respiratory distress. Lungs wheezes anteriorly. No use of accessory muscles.  Doug Sou, MD 01/28/13 [phone]

## 2013-01-27 NOTE — ED Notes (Signed)
Pt given phone to speak with daughter °

## 2013-01-27 NOTE — ED Provider Notes (Signed)
CSN: [id number]     Arrival date & time 01/27/13  1715 History   First MD Initiated Contact with Patient 01/27/13 1715     Chief Complaint  Patient presents with  . Shortness of Breath   (Consider location/radiation/quality/duration/timing/severity/associated sxs/prior Treatment) Patient is a 79 y.o. female presenting with shortness of breath.  Shortness of Breath Severity:  Moderate Onset quality:  Gradual Duration:  2 weeks Timing:  Constant Progression:  Unchanged Chronicity:  Chronic Relieved by:  Nothing Worsened by:  Deep breathing, exertion and movement Ineffective treatments:  None tried Associated symptoms: no abdominal pain, no chest pain, no cough, no fever, no rash, no sore throat and no vomiting     Past Medical History  Diagnosis Date  . Chronic combined systolic and diastolic heart failure     a. Echocardiogram 10/12: Septal and apical HK, mild focal basal hypertrophy the septum, EF 45-50%, mild MR, moderate LAE, mild RAE, PASP 39;  b. Echo 11/13: mild LVH, focal basal hypertrophy, EF 35-40%, mid to dist Ant-Septal AK, MAC, mild MR, mod to sev BAE, mod reduced RVSF, PASP 47  . Cardiac pacemaker in situ   . Anemia, pernicious   . Atrial fibrillation     S/P AV node ablation; coumadin managed by PCP  . Von Willebrand's disease   . Gallbladder disease   . Hypothyroidism   . Diabetes mellitus   . CAD (coronary artery disease)     a. s/p CABG;  b. Myoview 11/11: inferior scar with mild peri-infarct ischemia, EF 45%;   c. Lex MV 11/13: mild apical ischemia, apical HK, EF 61%  . COPD (chronic obstructive pulmonary disease)    Past Surgical History  Procedure Laterality Date  . Coronary artery bypass graft    . Mandibular reconstruction w/ iliac bone graft    . Appendectomy    . Hernia repair     No family history on file. History  Substance Use Topics  . Smoking status: Former Smoker    Quit date: 03/29/1989  . Smokeless tobacco: Not on file  . Alcohol Use:  No   OB History   Grav Para Term Preterm Abortions TAB SAB Ect Mult Living                 Review of Systems  Constitutional: Negative for fever and chills.  HENT: Negative for congestion, sore throat and rhinorrhea.   Eyes: Negative for photophobia and visual disturbance.  Respiratory: Positive for shortness of breath. Negative for cough.   Cardiovascular: Negative for chest pain and leg swelling.  Gastrointestinal: Negative for nausea, vomiting, abdominal pain, diarrhea and constipation.  Endocrine: Negative for polyphagia and polyuria.  Genitourinary: Negative for dysuria, flank pain, vaginal bleeding, vaginal discharge and enuresis.  Musculoskeletal: Negative for back pain and gait problem.  Skin: Negative for color change and rash.  Neurological: Negative for dizziness, syncope, light-headedness and numbness.  Hematological: Negative for adenopathy. Does not bruise/bleed easily.  All other systems reviewed and are negative.    Allergies  Carbamazepine; Cefaclor; Cephalexin; Clarithromycin; Dicyclomine; Dicyclomine hcl; Sulfonamide derivatives; Amoxicillin-pot clavulanate; and Sulfamethoxazole  Home Medications   Current Outpatient Rx  Name  Route  Sig  Dispense  Refill  . EXPIRED: albuterol (PROVENTIL HFA;VENTOLIN HFA) 108 (90 BASE) MCG/ACT inhaler   Inhalation   Inhale 2-4 puffs into the lungs every 2 (two) hours as needed for wheezing or shortness of breath.   1 Inhaler   0   . albuterol (PROVENTIL) (2.5 MG/3ML) 0.083%  nebulizer solution   Nebulization   Take 3 mLs (2.5 mg total) by nebulization every 6 (six) hours as needed for wheezing or shortness of breath. For wheezing or shortness of breath   75 mL   prn   . diltiazem (CARDIZEM CD) 120 MG 24 hr capsule   Oral   Take 120 mg by mouth daily.         . ergocalciferol (VITAMIN D2) 50000 UNITS capsule   Oral   Take 50,000 Units by mouth once a week.         . furosemide (LASIX) 40 MG tablet   Oral    Take 40 mg by mouth daily.         . furosemide (LASIX) 40 MG tablet   Oral   Take 1 tablet (40 mg total) by mouth 2 (two) times daily.   10 tablet   0   . guaiFENesin (MUCINEX) 600 MG 12 hr tablet   Oral   Take 600-1,200 mg by mouth daily as needed. For congestion         . EXPIRED: insulin glargine (LANTUS) 100 UNIT/ML injection   Subcutaneous   Inject 60 Units into the skin at bedtime.         . Insulin Lispro, Human, (HUMALOG KWIKPEN [place])   Subcutaneous   Inject 12 Units into the skin 3 (three) times daily.          Marland Kitchen levothyroxine (SYNTHROID, LEVOTHROID) 200 MCG tablet   Oral   Take 200 mcg by mouth daily.         . metoprolol succinate (TOPROL-XL) 100 MG 24 hr tablet   Oral   Take 50 mg by mouth daily. Take with or immediately following a meal.         . nitroGLYCERIN (NITROSTAT) 0.4 MG SL tablet   Sublingual   Place 0.4 mg under the tongue every 5 (five) minutes as needed. For chest pain.         . potassium chloride (K-DUR) 10 MEQ tablet   Oral   Take 10 mEq by mouth daily.         . rosuvastatin (CRESTOR) 10 MG tablet   Oral   Take 10 mg by mouth daily.         . sertraline (ZOLOFT) 100 MG tablet   Oral   Take 100 mg by mouth daily.           Marland Kitchen warfarin (COUMADIN) 5 MG tablet      as directed.           BP 160/90  Pulse 73  Temp(Src) 98.8 F (37.1 C) (Oral)  Resp 26  SpO2 99% Physical Exam  Vitals reviewed. Constitutional: She is oriented to person, place, and time. She appears well-developed and well-nourished.  HENT:  Head: Normocephalic and atraumatic.  Right Ear: External ear normal.  Left Ear: External ear normal.  Eyes: Conjunctivae and EOM are normal. Pupils are equal, round, and reactive to light.  Neck: Normal range of motion. Neck supple.  Cardiovascular: Normal rate, regular rhythm, normal heart sounds and intact distal pulses.   Pulmonary/Chest: Accessory muscle usage present. She has wheezes in the right  middle field, the right lower field and the left lower field. She has rales in the right lower field and the left lower field.  Abdominal: Soft. Bowel sounds are normal. There is no tenderness.  Musculoskeletal: Normal range of motion.  Neurological: She is alert and oriented to person,  place, and time.  Skin: Skin is warm and dry.    ED Course  Procedures (including critical care time) Labs Review Labs Reviewed  BASIC METABOLIC PANEL - Abnormal; Notable for the following:    Chloride 95 (*)    Glucose, Bld 322 (*)    BUN 28 (*)    GFR calc non Af Amer 53 (*)    GFR calc Af Amer 62 (*)    All other components within normal limits  CBC - Abnormal; Notable for the following:    RDW 17.2 (*)    All other components within normal limits  PRO B NATRIURETIC PEPTIDE - Abnormal; Notable for the following:    Pro B Natriuretic peptide (BNP) 4716.0 (*)    All other components within normal limits  URINALYSIS, ROUTINE W REFLEX MICROSCOPIC - Abnormal; Notable for the following:    Glucose, UA 500 (*)    All other components within normal limits  PROTIME-INR - Abnormal; Notable for the following:    Prothrombin Time 26.6 (*)    INR 2.55 (*)    All other components within normal limits  POCT I-STAT TROPONIN I   Imaging Review Dg Chest 2 View  01/27/2013   CLINICAL DATA:  Shortness of breath and pneumonia.  EXAM: CHEST - 2 VIEW  COMPARISON:  01/25/2013 film at [facility]  FINDINGS: There is significant improved aeration of both lungs, especially the right. When compared to the prior study, most of the opacity noted on that chest x-ray was likely related to atelectasis. No pulmonary edema or significant pleural effusions are seen. The heart is mildly enlarged and there is a stable appearance to a pacemaker. The bony thorax shows osteopenia and degenerative changes of the spine.  IMPRESSION: Improved aeration, especially of the right lung. Most of the opacity noted on the recent chest x-ray  was likely related to atelectasis.   Electronically Signed   By: Irish Lack M.D.   On: 01/27/2013 19:21    MDM   1. Dyspnea   2. Community acquired pneumonia   3. CHF (congestive heart failure)    79 y.o. female  with pertinent PMH of Afib, CHF, CAD, COPD presents with cough, dyspnea from home, however dc today from [place] for PNA.  She was sent home with levaquin, however did not have this filled and an ambulance was called within 30 minutes of pt's arrival home for transport here.  No increase in home O2 (2L).  Pt ambulated also without significant decrease in sats (94%).  CXR and labs without acute pathology, there is some increase in BNP, however pt has baseline chf and is on home lasix, taking po and with subjective baseline dyspnea at this time, clear CXR. Likely multifactorial etiology of symptoms, and pt has therapy for PNA at home.  Will prescribe BID lasix to pt for next 5 days and fu with PCP.  Doubt PE, PTX, ACS, or other pathology given scenario.  DC home in stable condition.   Labs and imaging as above reviewed by myself and attending,Dr. Ethelda Chick, with whom case was discussed.   1. Dyspnea   2. Community acquired pneumonia   3. CHF (congestive heart failure)         Noel Gerold, MD 01/28/13 1511

## 2013-01-28 DIAGNOSIS — I509 Heart failure, unspecified: Secondary | ICD-10-CM | POA: Diagnosis not present

## 2013-01-28 DIAGNOSIS — H3530 Unspecified macular degeneration: Secondary | ICD-10-CM | POA: Diagnosis not present

## 2013-01-28 DIAGNOSIS — J189 Pneumonia, unspecified organism: Secondary | ICD-10-CM | POA: Diagnosis not present

## 2013-01-28 DIAGNOSIS — F0391 Unspecified dementia with behavioral disturbance: Secondary | ICD-10-CM | POA: Diagnosis not present

## 2013-01-28 DIAGNOSIS — E119 Type 2 diabetes mellitus without complications: Secondary | ICD-10-CM | POA: Diagnosis not present

## 2013-01-28 DIAGNOSIS — J441 Chronic obstructive pulmonary disease with (acute) exacerbation: Secondary | ICD-10-CM | POA: Diagnosis not present

## 2013-01-28 NOTE — ED Provider Notes (Signed)
I have personally seen and examined the patient.  I have discussed the plan of care with the resident.  I have reviewed the documentation on PMH/FH/Soc. History.  I have reviewed the documentation of the resident and agree.  Doug Sou, MD 01/28/13 1731

## 2013-01-30 ENCOUNTER — Ambulatory Visit: Payer: Medicare Other | Admitting: Internal Medicine

## 2013-01-30 DIAGNOSIS — F0391 Unspecified dementia with behavioral disturbance: Secondary | ICD-10-CM | POA: Diagnosis not present

## 2013-01-30 DIAGNOSIS — J441 Chronic obstructive pulmonary disease with (acute) exacerbation: Secondary | ICD-10-CM | POA: Diagnosis not present

## 2013-01-30 DIAGNOSIS — J189 Pneumonia, unspecified organism: Secondary | ICD-10-CM | POA: Diagnosis not present

## 2013-01-30 DIAGNOSIS — I509 Heart failure, unspecified: Secondary | ICD-10-CM | POA: Diagnosis not present

## 2013-01-30 DIAGNOSIS — H3530 Unspecified macular degeneration: Secondary | ICD-10-CM | POA: Diagnosis not present

## 2013-01-30 DIAGNOSIS — E119 Type 2 diabetes mellitus without complications: Secondary | ICD-10-CM | POA: Diagnosis not present

## 2013-01-31 DIAGNOSIS — R062 Wheezing: Secondary | ICD-10-CM | POA: Diagnosis not present

## 2013-01-31 DIAGNOSIS — H3530 Unspecified macular degeneration: Secondary | ICD-10-CM | POA: Diagnosis not present

## 2013-01-31 DIAGNOSIS — F0390 Unspecified dementia without behavioral disturbance: Secondary | ICD-10-CM | POA: Diagnosis not present

## 2013-01-31 DIAGNOSIS — I509 Heart failure, unspecified: Secondary | ICD-10-CM | POA: Diagnosis not present

## 2013-01-31 DIAGNOSIS — J189 Pneumonia, unspecified organism: Secondary | ICD-10-CM | POA: Diagnosis not present

## 2013-01-31 DIAGNOSIS — J441 Chronic obstructive pulmonary disease with (acute) exacerbation: Secondary | ICD-10-CM | POA: Diagnosis not present

## 2013-01-31 DIAGNOSIS — E119 Type 2 diabetes mellitus without complications: Secondary | ICD-10-CM | POA: Diagnosis not present

## 2013-01-31 DIAGNOSIS — R079 Chest pain, unspecified: Secondary | ICD-10-CM | POA: Diagnosis not present

## 2013-01-31 DIAGNOSIS — F0391 Unspecified dementia with behavioral disturbance: Secondary | ICD-10-CM | POA: Diagnosis not present

## 2013-01-31 DIAGNOSIS — R0602 Shortness of breath: Secondary | ICD-10-CM | POA: Diagnosis not present

## 2013-01-31 DIAGNOSIS — F411 Generalized anxiety disorder: Secondary | ICD-10-CM | POA: Diagnosis not present

## 2013-01-31 DIAGNOSIS — Z79899 Other long term (current) drug therapy: Secondary | ICD-10-CM | POA: Diagnosis not present

## 2013-02-01 DIAGNOSIS — J189 Pneumonia, unspecified organism: Secondary | ICD-10-CM | POA: Diagnosis not present

## 2013-02-01 DIAGNOSIS — E039 Hypothyroidism, unspecified: Secondary | ICD-10-CM | POA: Diagnosis not present

## 2013-02-01 DIAGNOSIS — E538 Deficiency of other specified B group vitamins: Secondary | ICD-10-CM | POA: Diagnosis not present

## 2013-02-01 DIAGNOSIS — H6120 Impacted cerumen, unspecified ear: Secondary | ICD-10-CM | POA: Diagnosis not present

## 2013-02-01 DIAGNOSIS — E559 Vitamin D deficiency, unspecified: Secondary | ICD-10-CM | POA: Diagnosis not present

## 2013-02-01 DIAGNOSIS — E1149 Type 2 diabetes mellitus with other diabetic neurological complication: Secondary | ICD-10-CM | POA: Diagnosis not present

## 2013-02-01 DIAGNOSIS — Z79899 Other long term (current) drug therapy: Secondary | ICD-10-CM | POA: Diagnosis not present

## 2013-02-01 DIAGNOSIS — I10 Essential (primary) hypertension: Secondary | ICD-10-CM | POA: Diagnosis not present

## 2013-02-02 DIAGNOSIS — I252 Old myocardial infarction: Secondary | ICD-10-CM | POA: Diagnosis not present

## 2013-02-02 DIAGNOSIS — R0602 Shortness of breath: Secondary | ICD-10-CM | POA: Diagnosis not present

## 2013-02-02 DIAGNOSIS — D649 Anemia, unspecified: Secondary | ICD-10-CM | POA: Diagnosis not present

## 2013-02-02 DIAGNOSIS — J441 Chronic obstructive pulmonary disease with (acute) exacerbation: Secondary | ICD-10-CM | POA: Diagnosis not present

## 2013-02-02 DIAGNOSIS — S59909A Unspecified injury of unspecified elbow, initial encounter: Secondary | ICD-10-CM | POA: Diagnosis not present

## 2013-02-02 DIAGNOSIS — I509 Heart failure, unspecified: Secondary | ICD-10-CM | POA: Diagnosis not present

## 2013-02-02 DIAGNOSIS — J449 Chronic obstructive pulmonary disease, unspecified: Secondary | ICD-10-CM | POA: Diagnosis not present

## 2013-02-02 DIAGNOSIS — R079 Chest pain, unspecified: Secondary | ICD-10-CM | POA: Diagnosis not present

## 2013-02-02 DIAGNOSIS — S20219A Contusion of unspecified front wall of thorax, initial encounter: Secondary | ICD-10-CM | POA: Diagnosis not present

## 2013-02-02 DIAGNOSIS — S298XXA Other specified injuries of thorax, initial encounter: Secondary | ICD-10-CM | POA: Diagnosis not present

## 2013-02-02 DIAGNOSIS — S8253XA Displaced fracture of medial malleolus of unspecified tibia, initial encounter for closed fracture: Secondary | ICD-10-CM | POA: Diagnosis not present

## 2013-02-02 DIAGNOSIS — M25539 Pain in unspecified wrist: Secondary | ICD-10-CM | POA: Diagnosis not present

## 2013-02-02 DIAGNOSIS — S9000XA Contusion of unspecified ankle, initial encounter: Secondary | ICD-10-CM | POA: Diagnosis not present

## 2013-02-02 DIAGNOSIS — J189 Pneumonia, unspecified organism: Secondary | ICD-10-CM | POA: Diagnosis not present

## 2013-02-02 DIAGNOSIS — H3530 Unspecified macular degeneration: Secondary | ICD-10-CM | POA: Diagnosis not present

## 2013-02-02 DIAGNOSIS — W010XXA Fall on same level from slipping, tripping and stumbling without subsequent striking against object, initial encounter: Secondary | ICD-10-CM | POA: Diagnosis not present

## 2013-02-02 DIAGNOSIS — F0391 Unspecified dementia with behavioral disturbance: Secondary | ICD-10-CM | POA: Diagnosis not present

## 2013-02-02 DIAGNOSIS — S8263XA Displaced fracture of lateral malleolus of unspecified fibula, initial encounter for closed fracture: Secondary | ICD-10-CM | POA: Diagnosis not present

## 2013-02-02 DIAGNOSIS — I4891 Unspecified atrial fibrillation: Secondary | ICD-10-CM | POA: Diagnosis not present

## 2013-02-02 DIAGNOSIS — E119 Type 2 diabetes mellitus without complications: Secondary | ICD-10-CM | POA: Diagnosis not present

## 2013-02-02 DIAGNOSIS — I10 Essential (primary) hypertension: Secondary | ICD-10-CM | POA: Diagnosis not present

## 2013-02-03 ENCOUNTER — Encounter: Payer: Self-pay | Admitting: Internal Medicine

## 2013-02-03 ENCOUNTER — Ambulatory Visit (INDEPENDENT_AMBULATORY_CARE_PROVIDER_SITE_OTHER): Payer: Medicare Other | Admitting: Internal Medicine

## 2013-02-03 VITALS — BP 116/70 | HR 74 | Ht 65.0 in | Wt 192.2 lb

## 2013-02-03 DIAGNOSIS — M25579 Pain in unspecified ankle and joints of unspecified foot: Secondary | ICD-10-CM | POA: Diagnosis not present

## 2013-02-03 DIAGNOSIS — I4891 Unspecified atrial fibrillation: Secondary | ICD-10-CM | POA: Diagnosis not present

## 2013-02-03 DIAGNOSIS — S8263XA Displaced fracture of lateral malleolus of unspecified fibula, initial encounter for closed fracture: Secondary | ICD-10-CM | POA: Diagnosis not present

## 2013-02-03 DIAGNOSIS — E7800 Pure hypercholesterolemia, unspecified: Secondary | ICD-10-CM | POA: Diagnosis not present

## 2013-02-03 DIAGNOSIS — S59909A Unspecified injury of unspecified elbow, initial encounter: Secondary | ICD-10-CM | POA: Diagnosis not present

## 2013-02-03 DIAGNOSIS — W010XXA Fall on same level from slipping, tripping and stumbling without subsequent striking against object, initial encounter: Secondary | ICD-10-CM | POA: Diagnosis not present

## 2013-02-03 DIAGNOSIS — J449 Chronic obstructive pulmonary disease, unspecified: Secondary | ICD-10-CM | POA: Diagnosis not present

## 2013-02-03 DIAGNOSIS — F0390 Unspecified dementia without behavioral disturbance: Secondary | ICD-10-CM | POA: Diagnosis not present

## 2013-02-03 DIAGNOSIS — S9000XA Contusion of unspecified ankle, initial encounter: Secondary | ICD-10-CM | POA: Diagnosis not present

## 2013-02-03 DIAGNOSIS — E119 Type 2 diabetes mellitus without complications: Secondary | ICD-10-CM | POA: Diagnosis not present

## 2013-02-03 DIAGNOSIS — I509 Heart failure, unspecified: Secondary | ICD-10-CM | POA: Diagnosis not present

## 2013-02-03 DIAGNOSIS — M25539 Pain in unspecified wrist: Secondary | ICD-10-CM | POA: Diagnosis not present

## 2013-02-03 DIAGNOSIS — I10 Essential (primary) hypertension: Secondary | ICD-10-CM | POA: Diagnosis not present

## 2013-02-03 DIAGNOSIS — K219 Gastro-esophageal reflux disease without esophagitis: Secondary | ICD-10-CM | POA: Diagnosis not present

## 2013-02-03 DIAGNOSIS — S298XXA Other specified injuries of thorax, initial encounter: Secondary | ICD-10-CM | POA: Diagnosis not present

## 2013-02-03 DIAGNOSIS — S20219A Contusion of unspecified front wall of thorax, initial encounter: Secondary | ICD-10-CM | POA: Diagnosis not present

## 2013-02-03 DIAGNOSIS — T148XXA Other injury of unspecified body region, initial encounter: Secondary | ICD-10-CM | POA: Diagnosis not present

## 2013-02-03 DIAGNOSIS — S8253XA Displaced fracture of medial malleolus of unspecified tibia, initial encounter for closed fracture: Secondary | ICD-10-CM | POA: Diagnosis not present

## 2013-02-03 DIAGNOSIS — E039 Hypothyroidism, unspecified: Secondary | ICD-10-CM | POA: Diagnosis not present

## 2013-02-03 DIAGNOSIS — R079 Chest pain, unspecified: Secondary | ICD-10-CM | POA: Diagnosis not present

## 2013-02-03 DIAGNOSIS — M25569 Pain in unspecified knee: Secondary | ICD-10-CM | POA: Diagnosis not present

## 2013-02-03 MED ORDER — ALPRAZOLAM 0.5 MG PO TABS: 0.2500 mg | ORAL_TABLET | Freq: Two times a day (BID) | ORAL | Status: AC | PRN

## 2013-02-03 MED ORDER — BUDESONIDE 0.25 MG/2ML IN SUSP: 0.2500 mg | Freq: Two times a day (BID) | RESPIRATORY_TRACT | Status: AC

## 2013-02-03 NOTE — Patient Instructions (Addendum)
Script for budesonide to use by nebulizer twice every day- for [facility]    Order- DME [facility] Need maintenance on home O2 concentrator for 2.5 L/M continuous    Dx COPD   Script for alprazolam for occasional use for anxiety if needed

## 2013-02-03 NOTE — Progress Notes (Signed)
03/18/12- 87 yoF former smoker FOLLOWS FOR: SOB and getting worse(at night)-not sleeping well because of it(goes to sleep around 4-5am). Even with O2-walking makes her SOB as well.  Son here She is living in her own home. Shortness of breath worse in the last 2 weeks and worse supine so she delays going to bed. O2 2L/ Am Home Pt. no acute event and no chest pain or palpitation recently. Cardiology recently did echocardiogram and stress test. I reviewed those results with them today. Nebulized albuterol sometimes helps. ECHO 03/10/12 Study Conclusions: - Left ventricle: Wall thickness was increased in a pattern of mild LVH. There was focal basal hypertrophy. Systolic function was moderately reduced. The estimated ejection fraction was in the range of 35% to 40%. Akinesis of the mid-distalanteroseptal myocardium. - Mitral valve: Calcified annulus. Moderately thickened, moderately calcified leaflets . Mild regurgitation. - Left atrium: The atrium was moderately to severely dilated. - Right ventricle: Systolic function was moderately reduced. - Right atrium: The atrium was moderately to severely dilated. - Pulmonary arteries: Systolic pressure was mildly to moderately increased. PA peak pressure: 47mm Hg (S). Myoview 03/16/12 Overall Impression: There is some reversibility at the apex. This is consistent with some mild apical ischemia. There is hypokinesis of the septum consistent with prior CABG. There is also mild hypokinesis of the apex.  LV Ejection Fraction: 61%. LV Wall Motion: There is hypokinesis of the septum consistent with prior CABG. There is also mild hypokinesis at the apex.  Willa Rough, MD CXR 02/24/12 IMPRESSION:  No active disease. No significant change.  Original Report Authenticated By: Natasha Mead, M.D.   02/03/13- 88 yoF former smoker followed for COPD, complicated by CAD/CABG/ischemic CM/ CHF, DM2, PAFib Recent hosp [place], then seen 10/3 ER for dyspnea and released  w/ dx acute and chronic combined systolic and diastolic CHF.  Daughter here [facility] Follow up.  Was admited to [facility] x 3 days with pna then was seen in [facility] ED and [facility] ED last night.  Has SOB, chest tightness, and nonprod cough.  Has had flu and pneumonia vaccines this fall. Conversation was a little difficult, reflecting apparent dementia. She frequently repeated herself and had diverging complaints especially about blurring vision-daughter reminded her she had been diagnosed with macular degeneration. Continuous oxygen 2.5 L/[facility]  CXR 01/27/13 IMPRESSION:  Improved aeration, especially of the right lung. Most of the opacity  noted on the recent chest x-ray was likely related to atelectasis.  Electronically Signed  By: Irish Lack M.D.  On: 01/27/2013 19:21  ROS-see HPI Constitutional:   No-   weight loss, night sweats, fevers, chills, fatigue, lassitude. HEENT:   No-  headaches, difficulty swallowing, tooth/dental problems, sore throat,       No-  sneezing, itching, ear ache, nasal congestion, post nasal drip,  CV:  No-   chest pain, orthopnea, PND, swelling in lower extremities, anasarca, dizziness, palpitations Resp: +shortness of breath with exertion or at rest.              No-   productive cough, + non-productive cough,  No- coughing up of blood.              No-   change in color of mucus.  No- wheezing.   Skin: No-   rash or lesions. GI:  No-   heartburn, indigestion, abdominal pain, nausea, vomiting,  GU:  MS:  No-   joint pain or swelling.   Neuro-     nothing unusual Psych:  No- change in mood or affect. No depression or anxiety.  No memory loss.  OBJ- Physical Exam  O2 2 L General- Alert, Oriented, Affect-appropriate, Distress- none acute. Slumped posture Skin- rash-none, lesions- none, excoriation- none Lymphadenopathy- none Head- atraumatic            Eyes- Gross vision intact, PERRLA, conjunctivae and secretions clear             Ears- Hearing, canals-normal            Nose- Clear, no-Septal dev, mucus, polyps, erosion, perforation             Throat- Mallampati II , mucosa clear , drainage- none, tonsils- atrophic Neck- flexible , trachea midline, no stridor , thyroid nl, carotid no bruit Chest - symmetrical excursion , unlabored           Heart/CV- RRR , no murmur , no gallop  , no rub, nl s1 s2                           - JVD- none , edema- none, stasis changes- none, varices- none           Lung- clear to P&A, +raspy cough , dullness-none, rub- none           Chest wall- L pacemaker Abd-  Br/ Gen/ Rectal- Not done, not indicated Extrem- cyanosis- none, clubbing, none, atrophy- none, strength-rolling walker  nlNeuro- grossly intact to observation

## 2013-02-04 DIAGNOSIS — E119 Type 2 diabetes mellitus without complications: Secondary | ICD-10-CM | POA: Diagnosis not present

## 2013-02-04 DIAGNOSIS — J441 Chronic obstructive pulmonary disease with (acute) exacerbation: Secondary | ICD-10-CM | POA: Diagnosis not present

## 2013-02-04 DIAGNOSIS — H3530 Unspecified macular degeneration: Secondary | ICD-10-CM | POA: Diagnosis not present

## 2013-02-04 DIAGNOSIS — F0391 Unspecified dementia with behavioral disturbance: Secondary | ICD-10-CM | POA: Diagnosis not present

## 2013-02-04 DIAGNOSIS — I509 Heart failure, unspecified: Secondary | ICD-10-CM | POA: Diagnosis not present

## 2013-02-04 DIAGNOSIS — J189 Pneumonia, unspecified organism: Secondary | ICD-10-CM | POA: Diagnosis not present

## 2013-02-06 DIAGNOSIS — H3530 Unspecified macular degeneration: Secondary | ICD-10-CM | POA: Diagnosis not present

## 2013-02-06 DIAGNOSIS — I509 Heart failure, unspecified: Secondary | ICD-10-CM | POA: Diagnosis not present

## 2013-02-06 DIAGNOSIS — J189 Pneumonia, unspecified organism: Secondary | ICD-10-CM | POA: Diagnosis not present

## 2013-02-06 DIAGNOSIS — J441 Chronic obstructive pulmonary disease with (acute) exacerbation: Secondary | ICD-10-CM | POA: Diagnosis not present

## 2013-02-06 DIAGNOSIS — F0391 Unspecified dementia with behavioral disturbance: Secondary | ICD-10-CM | POA: Diagnosis not present

## 2013-02-06 DIAGNOSIS — E119 Type 2 diabetes mellitus without complications: Secondary | ICD-10-CM | POA: Diagnosis not present

## 2013-02-07 DIAGNOSIS — S8263XA Displaced fracture of lateral malleolus of unspecified fibula, initial encounter for closed fracture: Secondary | ICD-10-CM | POA: Diagnosis not present

## 2013-02-08 DIAGNOSIS — H3530 Unspecified macular degeneration: Secondary | ICD-10-CM | POA: Diagnosis not present

## 2013-02-08 DIAGNOSIS — E119 Type 2 diabetes mellitus without complications: Secondary | ICD-10-CM | POA: Diagnosis not present

## 2013-02-08 DIAGNOSIS — F0391 Unspecified dementia with behavioral disturbance: Secondary | ICD-10-CM | POA: Diagnosis not present

## 2013-02-08 DIAGNOSIS — I509 Heart failure, unspecified: Secondary | ICD-10-CM | POA: Diagnosis not present

## 2013-02-08 DIAGNOSIS — J189 Pneumonia, unspecified organism: Secondary | ICD-10-CM | POA: Diagnosis not present

## 2013-02-08 DIAGNOSIS — J441 Chronic obstructive pulmonary disease with (acute) exacerbation: Secondary | ICD-10-CM | POA: Diagnosis not present

## 2013-02-09 DIAGNOSIS — J441 Chronic obstructive pulmonary disease with (acute) exacerbation: Secondary | ICD-10-CM | POA: Diagnosis not present

## 2013-02-09 DIAGNOSIS — I509 Heart failure, unspecified: Secondary | ICD-10-CM | POA: Diagnosis not present

## 2013-02-09 DIAGNOSIS — H3530 Unspecified macular degeneration: Secondary | ICD-10-CM | POA: Diagnosis not present

## 2013-02-09 DIAGNOSIS — F0391 Unspecified dementia with behavioral disturbance: Secondary | ICD-10-CM | POA: Diagnosis not present

## 2013-02-09 DIAGNOSIS — E119 Type 2 diabetes mellitus without complications: Secondary | ICD-10-CM | POA: Diagnosis not present

## 2013-02-09 DIAGNOSIS — J189 Pneumonia, unspecified organism: Secondary | ICD-10-CM | POA: Diagnosis not present

## 2013-02-10 DIAGNOSIS — H3530 Unspecified macular degeneration: Secondary | ICD-10-CM | POA: Diagnosis not present

## 2013-02-10 DIAGNOSIS — F0391 Unspecified dementia with behavioral disturbance: Secondary | ICD-10-CM | POA: Diagnosis not present

## 2013-02-10 DIAGNOSIS — J441 Chronic obstructive pulmonary disease with (acute) exacerbation: Secondary | ICD-10-CM | POA: Diagnosis not present

## 2013-02-10 DIAGNOSIS — E119 Type 2 diabetes mellitus without complications: Secondary | ICD-10-CM | POA: Diagnosis not present

## 2013-02-10 DIAGNOSIS — J189 Pneumonia, unspecified organism: Secondary | ICD-10-CM | POA: Diagnosis not present

## 2013-02-10 DIAGNOSIS — I509 Heart failure, unspecified: Secondary | ICD-10-CM | POA: Diagnosis not present

## 2013-02-14 ENCOUNTER — Telehealth: Payer: Self-pay | Admitting: Internal Medicine

## 2013-02-14 ENCOUNTER — Encounter: Payer: Self-pay | Admitting: Internal Medicine

## 2013-02-14 DIAGNOSIS — I509 Heart failure, unspecified: Secondary | ICD-10-CM | POA: Diagnosis not present

## 2013-02-14 DIAGNOSIS — R0602 Shortness of breath: Secondary | ICD-10-CM | POA: Diagnosis not present

## 2013-02-14 DIAGNOSIS — R9431 Abnormal electrocardiogram [ECG] [EKG]: Secondary | ICD-10-CM | POA: Diagnosis not present

## 2013-02-14 DIAGNOSIS — M79609 Pain in unspecified limb: Secondary | ICD-10-CM | POA: Diagnosis not present

## 2013-02-14 DIAGNOSIS — I4891 Unspecified atrial fibrillation: Secondary | ICD-10-CM | POA: Diagnosis not present

## 2013-02-14 DIAGNOSIS — J449 Chronic obstructive pulmonary disease, unspecified: Secondary | ICD-10-CM | POA: Diagnosis not present

## 2013-02-14 DIAGNOSIS — M25519 Pain in unspecified shoulder: Secondary | ICD-10-CM | POA: Diagnosis not present

## 2013-02-14 DIAGNOSIS — R079 Chest pain, unspecified: Secondary | ICD-10-CM | POA: Diagnosis not present

## 2013-02-14 DIAGNOSIS — E039 Hypothyroidism, unspecified: Secondary | ICD-10-CM | POA: Diagnosis not present

## 2013-02-14 NOTE — Telephone Encounter (Signed)
02-14-13 SENT PT PAST DUE LETTER , CXL PACER CK FOR MAY 2014, NEEDS TO [facility] OR [place]/MT

## 2013-02-15 DIAGNOSIS — F0391 Unspecified dementia with behavioral disturbance: Secondary | ICD-10-CM | POA: Diagnosis not present

## 2013-02-15 DIAGNOSIS — I509 Heart failure, unspecified: Secondary | ICD-10-CM | POA: Diagnosis not present

## 2013-02-15 DIAGNOSIS — J441 Chronic obstructive pulmonary disease with (acute) exacerbation: Secondary | ICD-10-CM | POA: Diagnosis not present

## 2013-02-15 DIAGNOSIS — J189 Pneumonia, unspecified organism: Secondary | ICD-10-CM | POA: Diagnosis not present

## 2013-02-15 DIAGNOSIS — H3530 Unspecified macular degeneration: Secondary | ICD-10-CM | POA: Diagnosis not present

## 2013-02-15 DIAGNOSIS — E119 Type 2 diabetes mellitus without complications: Secondary | ICD-10-CM | POA: Diagnosis not present

## 2013-02-16 DIAGNOSIS — I509 Heart failure, unspecified: Secondary | ICD-10-CM | POA: Diagnosis not present

## 2013-02-16 DIAGNOSIS — J189 Pneumonia, unspecified organism: Secondary | ICD-10-CM | POA: Diagnosis not present

## 2013-02-16 DIAGNOSIS — H3530 Unspecified macular degeneration: Secondary | ICD-10-CM | POA: Diagnosis not present

## 2013-02-16 DIAGNOSIS — J441 Chronic obstructive pulmonary disease with (acute) exacerbation: Secondary | ICD-10-CM | POA: Diagnosis not present

## 2013-02-16 DIAGNOSIS — F0391 Unspecified dementia with behavioral disturbance: Secondary | ICD-10-CM | POA: Diagnosis not present

## 2013-02-16 DIAGNOSIS — E119 Type 2 diabetes mellitus without complications: Secondary | ICD-10-CM | POA: Diagnosis not present

## 2013-02-19 NOTE — Assessment & Plan Note (Signed)
Some perseveration and poor memory

## 2013-02-19 NOTE — Assessment & Plan Note (Addendum)
Probably nearly back to normal after pneumonia earlier this fall. Chest x-rays have shown residual atelectasis, quite consistent with her poor posture and tendency to slump in her chair. We discussed overlapping symptoms from history of heart failure Plan-budesonide, I agreed to refill Xanax for anxiety for occasional use as discussed with her and her daughter. She requests service check from DME company on her oxygen concentrator

## 2013-02-20 DIAGNOSIS — H3530 Unspecified macular degeneration: Secondary | ICD-10-CM | POA: Diagnosis not present

## 2013-02-20 DIAGNOSIS — I509 Heart failure, unspecified: Secondary | ICD-10-CM | POA: Diagnosis not present

## 2013-02-20 DIAGNOSIS — F0391 Unspecified dementia with behavioral disturbance: Secondary | ICD-10-CM | POA: Diagnosis not present

## 2013-02-20 DIAGNOSIS — J441 Chronic obstructive pulmonary disease with (acute) exacerbation: Secondary | ICD-10-CM | POA: Diagnosis not present

## 2013-02-20 DIAGNOSIS — J189 Pneumonia, unspecified organism: Secondary | ICD-10-CM | POA: Diagnosis not present

## 2013-02-20 DIAGNOSIS — E119 Type 2 diabetes mellitus without complications: Secondary | ICD-10-CM | POA: Diagnosis not present

## 2013-02-21 DIAGNOSIS — J441 Chronic obstructive pulmonary disease with (acute) exacerbation: Secondary | ICD-10-CM | POA: Diagnosis not present

## 2013-02-21 DIAGNOSIS — E119 Type 2 diabetes mellitus without complications: Secondary | ICD-10-CM | POA: Diagnosis not present

## 2013-02-21 DIAGNOSIS — I509 Heart failure, unspecified: Secondary | ICD-10-CM | POA: Diagnosis not present

## 2013-02-21 DIAGNOSIS — H3530 Unspecified macular degeneration: Secondary | ICD-10-CM | POA: Diagnosis not present

## 2013-02-21 DIAGNOSIS — F0391 Unspecified dementia with behavioral disturbance: Secondary | ICD-10-CM | POA: Diagnosis not present

## 2013-02-21 DIAGNOSIS — J189 Pneumonia, unspecified organism: Secondary | ICD-10-CM | POA: Diagnosis not present

## 2013-02-23 DIAGNOSIS — J441 Chronic obstructive pulmonary disease with (acute) exacerbation: Secondary | ICD-10-CM | POA: Diagnosis not present

## 2013-02-23 DIAGNOSIS — E119 Type 2 diabetes mellitus without complications: Secondary | ICD-10-CM | POA: Diagnosis not present

## 2013-02-23 DIAGNOSIS — I509 Heart failure, unspecified: Secondary | ICD-10-CM | POA: Diagnosis not present

## 2013-02-23 DIAGNOSIS — J189 Pneumonia, unspecified organism: Secondary | ICD-10-CM | POA: Diagnosis not present

## 2013-02-23 DIAGNOSIS — F0391 Unspecified dementia with behavioral disturbance: Secondary | ICD-10-CM | POA: Diagnosis not present

## 2013-02-23 DIAGNOSIS — H3530 Unspecified macular degeneration: Secondary | ICD-10-CM | POA: Diagnosis not present

## 2013-02-24 DIAGNOSIS — F0391 Unspecified dementia with behavioral disturbance: Secondary | ICD-10-CM | POA: Diagnosis not present

## 2013-02-24 DIAGNOSIS — J189 Pneumonia, unspecified organism: Secondary | ICD-10-CM | POA: Diagnosis not present

## 2013-02-24 DIAGNOSIS — J441 Chronic obstructive pulmonary disease with (acute) exacerbation: Secondary | ICD-10-CM | POA: Diagnosis not present

## 2013-02-24 DIAGNOSIS — I509 Heart failure, unspecified: Secondary | ICD-10-CM | POA: Diagnosis not present

## 2013-02-24 DIAGNOSIS — H3530 Unspecified macular degeneration: Secondary | ICD-10-CM | POA: Diagnosis not present

## 2013-02-24 DIAGNOSIS — E119 Type 2 diabetes mellitus without complications: Secondary | ICD-10-CM | POA: Diagnosis not present

## 2013-03-03 DIAGNOSIS — F0391 Unspecified dementia with behavioral disturbance: Secondary | ICD-10-CM | POA: Diagnosis not present

## 2013-03-03 DIAGNOSIS — I509 Heart failure, unspecified: Secondary | ICD-10-CM | POA: Diagnosis not present

## 2013-03-03 DIAGNOSIS — J441 Chronic obstructive pulmonary disease with (acute) exacerbation: Secondary | ICD-10-CM | POA: Diagnosis not present

## 2013-03-03 DIAGNOSIS — H3530 Unspecified macular degeneration: Secondary | ICD-10-CM | POA: Diagnosis not present

## 2013-03-03 DIAGNOSIS — J189 Pneumonia, unspecified organism: Secondary | ICD-10-CM | POA: Diagnosis not present

## 2013-03-03 DIAGNOSIS — Z7901 Long term (current) use of anticoagulants: Secondary | ICD-10-CM | POA: Diagnosis not present

## 2013-03-03 DIAGNOSIS — E119 Type 2 diabetes mellitus without complications: Secondary | ICD-10-CM | POA: Diagnosis not present

## 2013-03-11 DIAGNOSIS — E119 Type 2 diabetes mellitus without complications: Secondary | ICD-10-CM | POA: Diagnosis not present

## 2013-03-11 DIAGNOSIS — J441 Chronic obstructive pulmonary disease with (acute) exacerbation: Secondary | ICD-10-CM | POA: Diagnosis not present

## 2013-03-11 DIAGNOSIS — Z5181 Encounter for therapeutic drug level monitoring: Secondary | ICD-10-CM | POA: Diagnosis not present

## 2013-03-11 DIAGNOSIS — F0391 Unspecified dementia with behavioral disturbance: Secondary | ICD-10-CM | POA: Diagnosis not present

## 2013-03-11 DIAGNOSIS — I509 Heart failure, unspecified: Secondary | ICD-10-CM | POA: Diagnosis not present

## 2013-03-11 DIAGNOSIS — J189 Pneumonia, unspecified organism: Secondary | ICD-10-CM | POA: Diagnosis not present

## 2013-03-11 DIAGNOSIS — Z7901 Long term (current) use of anticoagulants: Secondary | ICD-10-CM | POA: Diagnosis not present

## 2013-03-11 DIAGNOSIS — H3530 Unspecified macular degeneration: Secondary | ICD-10-CM | POA: Diagnosis not present

## 2013-03-17 DIAGNOSIS — J189 Pneumonia, unspecified organism: Secondary | ICD-10-CM | POA: Diagnosis not present

## 2013-03-17 DIAGNOSIS — H3530 Unspecified macular degeneration: Secondary | ICD-10-CM | POA: Diagnosis not present

## 2013-03-17 DIAGNOSIS — I509 Heart failure, unspecified: Secondary | ICD-10-CM | POA: Diagnosis not present

## 2013-03-17 DIAGNOSIS — F0391 Unspecified dementia with behavioral disturbance: Secondary | ICD-10-CM | POA: Diagnosis not present

## 2013-03-17 DIAGNOSIS — E119 Type 2 diabetes mellitus without complications: Secondary | ICD-10-CM | POA: Diagnosis not present

## 2013-03-17 DIAGNOSIS — J441 Chronic obstructive pulmonary disease with (acute) exacerbation: Secondary | ICD-10-CM | POA: Diagnosis not present

## 2013-03-28 DIAGNOSIS — I509 Heart failure, unspecified: Secondary | ICD-10-CM | POA: Diagnosis not present

## 2013-03-28 DIAGNOSIS — J441 Chronic obstructive pulmonary disease with (acute) exacerbation: Secondary | ICD-10-CM | POA: Diagnosis not present

## 2013-03-28 DIAGNOSIS — H3530 Unspecified macular degeneration: Secondary | ICD-10-CM | POA: Diagnosis not present

## 2013-03-28 DIAGNOSIS — F0391 Unspecified dementia with behavioral disturbance: Secondary | ICD-10-CM | POA: Diagnosis not present

## 2013-03-28 DIAGNOSIS — J189 Pneumonia, unspecified organism: Secondary | ICD-10-CM | POA: Diagnosis not present

## 2013-03-28 DIAGNOSIS — Z5181 Encounter for therapeutic drug level monitoring: Secondary | ICD-10-CM | POA: Diagnosis not present

## 2013-03-28 DIAGNOSIS — E119 Type 2 diabetes mellitus without complications: Secondary | ICD-10-CM | POA: Diagnosis not present

## 2013-03-28 DIAGNOSIS — Z7901 Long term (current) use of anticoagulants: Secondary | ICD-10-CM | POA: Diagnosis not present

## 2013-03-28 DIAGNOSIS — I4891 Unspecified atrial fibrillation: Secondary | ICD-10-CM | POA: Diagnosis not present

## 2013-04-12 DIAGNOSIS — J988 Other specified respiratory disorders: Secondary | ICD-10-CM | POA: Diagnosis not present

## 2013-04-12 DIAGNOSIS — R05 Cough: Secondary | ICD-10-CM | POA: Diagnosis not present

## 2013-04-12 DIAGNOSIS — J449 Chronic obstructive pulmonary disease, unspecified: Secondary | ICD-10-CM | POA: Diagnosis not present

## 2013-04-12 DIAGNOSIS — R0602 Shortness of breath: Secondary | ICD-10-CM | POA: Diagnosis not present

## 2013-04-14 DIAGNOSIS — J441 Chronic obstructive pulmonary disease with (acute) exacerbation: Secondary | ICD-10-CM | POA: Diagnosis not present

## 2013-04-14 DIAGNOSIS — R7309 Other abnormal glucose: Secondary | ICD-10-CM | POA: Diagnosis not present

## 2013-04-14 DIAGNOSIS — H538 Other visual disturbances: Secondary | ICD-10-CM | POA: Diagnosis not present

## 2013-04-14 DIAGNOSIS — I509 Heart failure, unspecified: Secondary | ICD-10-CM | POA: Diagnosis not present

## 2013-04-14 DIAGNOSIS — R0902 Hypoxemia: Secondary | ICD-10-CM | POA: Diagnosis not present

## 2013-04-14 DIAGNOSIS — J4489 Other specified chronic obstructive pulmonary disease: Secondary | ICD-10-CM | POA: Diagnosis not present

## 2013-04-14 DIAGNOSIS — R0602 Shortness of breath: Secondary | ICD-10-CM | POA: Diagnosis not present

## 2013-04-14 DIAGNOSIS — R5381 Other malaise: Secondary | ICD-10-CM | POA: Diagnosis not present

## 2013-04-14 DIAGNOSIS — R7301 Impaired fasting glucose: Secondary | ICD-10-CM | POA: Diagnosis not present

## 2013-04-14 DIAGNOSIS — R079 Chest pain, unspecified: Secondary | ICD-10-CM | POA: Diagnosis not present

## 2013-04-14 DIAGNOSIS — N390 Urinary tract infection, site not specified: Secondary | ICD-10-CM | POA: Diagnosis not present

## 2013-04-15 DIAGNOSIS — R0602 Shortness of breath: Secondary | ICD-10-CM | POA: Diagnosis not present

## 2013-04-15 DIAGNOSIS — J449 Chronic obstructive pulmonary disease, unspecified: Secondary | ICD-10-CM | POA: Diagnosis not present

## 2013-04-15 DIAGNOSIS — J984 Other disorders of lung: Secondary | ICD-10-CM | POA: Diagnosis not present

## 2013-04-15 DIAGNOSIS — H538 Other visual disturbances: Secondary | ICD-10-CM | POA: Diagnosis not present

## 2013-04-16 DIAGNOSIS — J984 Other disorders of lung: Secondary | ICD-10-CM | POA: Diagnosis not present

## 2013-04-16 DIAGNOSIS — K219 Gastro-esophageal reflux disease without esophagitis: Secondary | ICD-10-CM | POA: Diagnosis present

## 2013-04-16 DIAGNOSIS — I2789 Other specified pulmonary heart diseases: Secondary | ICD-10-CM | POA: Diagnosis not present

## 2013-04-16 DIAGNOSIS — F0390 Unspecified dementia without behavioral disturbance: Secondary | ICD-10-CM | POA: Diagnosis present

## 2013-04-16 DIAGNOSIS — D72829 Elevated white blood cell count, unspecified: Secondary | ICD-10-CM | POA: Diagnosis not present

## 2013-04-16 DIAGNOSIS — I509 Heart failure, unspecified: Secondary | ICD-10-CM | POA: Diagnosis present

## 2013-04-16 DIAGNOSIS — Z950 Presence of cardiac pacemaker: Secondary | ICD-10-CM | POA: Diagnosis not present

## 2013-04-16 DIAGNOSIS — R0902 Hypoxemia: Secondary | ICD-10-CM | POA: Diagnosis not present

## 2013-04-16 DIAGNOSIS — J449 Chronic obstructive pulmonary disease, unspecified: Secondary | ICD-10-CM | POA: Diagnosis present

## 2013-04-16 DIAGNOSIS — R0602 Shortness of breath: Secondary | ICD-10-CM | POA: Diagnosis not present

## 2013-04-16 DIAGNOSIS — Z794 Long term (current) use of insulin: Secondary | ICD-10-CM | POA: Diagnosis not present

## 2013-04-16 DIAGNOSIS — Z87891 Personal history of nicotine dependence: Secondary | ICD-10-CM | POA: Diagnosis not present

## 2013-04-16 DIAGNOSIS — J9819 Other pulmonary collapse: Secondary | ICD-10-CM | POA: Diagnosis not present

## 2013-04-16 DIAGNOSIS — I5023 Acute on chronic systolic (congestive) heart failure: Secondary | ICD-10-CM | POA: Diagnosis not present

## 2013-04-16 DIAGNOSIS — I4891 Unspecified atrial fibrillation: Secondary | ICD-10-CM | POA: Diagnosis present

## 2013-04-16 DIAGNOSIS — Z79899 Other long term (current) drug therapy: Secondary | ICD-10-CM | POA: Diagnosis not present

## 2013-04-16 DIAGNOSIS — N390 Urinary tract infection, site not specified: Secondary | ICD-10-CM | POA: Diagnosis not present

## 2013-04-16 DIAGNOSIS — Z951 Presence of aortocoronary bypass graft: Secondary | ICD-10-CM | POA: Diagnosis not present

## 2013-04-16 DIAGNOSIS — Z7901 Long term (current) use of anticoagulants: Secondary | ICD-10-CM | POA: Diagnosis not present

## 2013-04-16 DIAGNOSIS — R42 Dizziness and giddiness: Secondary | ICD-10-CM | POA: Diagnosis not present

## 2013-04-16 DIAGNOSIS — E039 Hypothyroidism, unspecified: Secondary | ICD-10-CM | POA: Diagnosis present

## 2013-04-16 DIAGNOSIS — J441 Chronic obstructive pulmonary disease with (acute) exacerbation: Secondary | ICD-10-CM | POA: Diagnosis not present

## 2013-04-16 DIAGNOSIS — E119 Type 2 diabetes mellitus without complications: Secondary | ICD-10-CM | POA: Diagnosis present

## 2013-04-21 DIAGNOSIS — H538 Other visual disturbances: Secondary | ICD-10-CM | POA: Diagnosis not present

## 2013-04-21 DIAGNOSIS — I119 Hypertensive heart disease without heart failure: Secondary | ICD-10-CM | POA: Diagnosis not present

## 2013-04-22 DIAGNOSIS — Z79899 Other long term (current) drug therapy: Secondary | ICD-10-CM | POA: Diagnosis not present

## 2013-04-22 DIAGNOSIS — Z794 Long term (current) use of insulin: Secondary | ICD-10-CM | POA: Diagnosis not present

## 2013-04-22 DIAGNOSIS — Z95810 Presence of automatic (implantable) cardiac defibrillator: Secondary | ICD-10-CM | POA: Diagnosis not present

## 2013-04-22 DIAGNOSIS — F0390 Unspecified dementia without behavioral disturbance: Secondary | ICD-10-CM | POA: Diagnosis not present

## 2013-04-22 DIAGNOSIS — J441 Chronic obstructive pulmonary disease with (acute) exacerbation: Secondary | ICD-10-CM | POA: Diagnosis not present

## 2013-04-22 DIAGNOSIS — I10 Essential (primary) hypertension: Secondary | ICD-10-CM | POA: Diagnosis not present

## 2013-04-22 DIAGNOSIS — Z950 Presence of cardiac pacemaker: Secondary | ICD-10-CM | POA: Diagnosis not present

## 2013-04-22 DIAGNOSIS — I2510 Atherosclerotic heart disease of native coronary artery without angina pectoris: Secondary | ICD-10-CM | POA: Diagnosis not present

## 2013-04-22 DIAGNOSIS — IMO0002 Reserved for concepts with insufficient information to code with codable children: Secondary | ICD-10-CM | POA: Diagnosis not present

## 2013-04-22 DIAGNOSIS — R9431 Abnormal electrocardiogram [ECG] [EKG]: Secondary | ICD-10-CM | POA: Diagnosis not present

## 2013-04-22 DIAGNOSIS — I252 Old myocardial infarction: Secondary | ICD-10-CM | POA: Diagnosis not present

## 2013-04-22 DIAGNOSIS — H538 Other visual disturbances: Secondary | ICD-10-CM | POA: Diagnosis not present

## 2013-04-22 DIAGNOSIS — J438 Other emphysema: Secondary | ICD-10-CM | POA: Diagnosis not present

## 2013-04-22 DIAGNOSIS — F0391 Unspecified dementia with behavioral disturbance: Secondary | ICD-10-CM | POA: Diagnosis not present

## 2013-04-22 DIAGNOSIS — R7309 Other abnormal glucose: Secondary | ICD-10-CM | POA: Diagnosis not present

## 2013-04-22 DIAGNOSIS — E119 Type 2 diabetes mellitus without complications: Secondary | ICD-10-CM | POA: Diagnosis not present

## 2013-04-22 DIAGNOSIS — E7800 Pure hypercholesterolemia, unspecified: Secondary | ICD-10-CM | POA: Diagnosis not present

## 2013-04-22 DIAGNOSIS — I509 Heart failure, unspecified: Secondary | ICD-10-CM | POA: Diagnosis not present

## 2013-04-24 DIAGNOSIS — J438 Other emphysema: Secondary | ICD-10-CM | POA: Diagnosis not present

## 2013-04-24 DIAGNOSIS — E039 Hypothyroidism, unspecified: Secondary | ICD-10-CM | POA: Diagnosis not present

## 2013-04-24 DIAGNOSIS — Z7901 Long term (current) use of anticoagulants: Secondary | ICD-10-CM | POA: Diagnosis not present

## 2013-04-24 DIAGNOSIS — I252 Old myocardial infarction: Secondary | ICD-10-CM | POA: Diagnosis not present

## 2013-04-24 DIAGNOSIS — F0390 Unspecified dementia without behavioral disturbance: Secondary | ICD-10-CM | POA: Diagnosis not present

## 2013-04-24 DIAGNOSIS — Z95810 Presence of automatic (implantable) cardiac defibrillator: Secondary | ICD-10-CM | POA: Diagnosis not present

## 2013-04-24 DIAGNOSIS — Z951 Presence of aortocoronary bypass graft: Secondary | ICD-10-CM | POA: Diagnosis not present

## 2013-04-24 DIAGNOSIS — R079 Chest pain, unspecified: Secondary | ICD-10-CM | POA: Diagnosis not present

## 2013-04-24 DIAGNOSIS — E119 Type 2 diabetes mellitus without complications: Secondary | ICD-10-CM | POA: Diagnosis not present

## 2013-04-24 DIAGNOSIS — I4891 Unspecified atrial fibrillation: Secondary | ICD-10-CM | POA: Diagnosis not present

## 2013-04-24 DIAGNOSIS — IMO0002 Reserved for concepts with insufficient information to code with codable children: Secondary | ICD-10-CM | POA: Diagnosis not present

## 2013-04-24 DIAGNOSIS — F319 Bipolar disorder, unspecified: Secondary | ICD-10-CM | POA: Diagnosis not present

## 2013-04-24 DIAGNOSIS — I2510 Atherosclerotic heart disease of native coronary artery without angina pectoris: Secondary | ICD-10-CM | POA: Diagnosis not present

## 2013-04-24 DIAGNOSIS — E7800 Pure hypercholesterolemia, unspecified: Secondary | ICD-10-CM | POA: Diagnosis not present

## 2013-04-24 DIAGNOSIS — Z87891 Personal history of nicotine dependence: Secondary | ICD-10-CM | POA: Diagnosis not present

## 2013-04-24 DIAGNOSIS — R0602 Shortness of breath: Secondary | ICD-10-CM | POA: Diagnosis not present

## 2013-04-25 DIAGNOSIS — Z961 Presence of intraocular lens: Secondary | ICD-10-CM | POA: Diagnosis not present

## 2013-04-25 DIAGNOSIS — F312 Bipolar disorder, current episode manic severe with psychotic features: Secondary | ICD-10-CM | POA: Diagnosis not present

## 2013-04-25 DIAGNOSIS — IMO0001 Reserved for inherently not codable concepts without codable children: Secondary | ICD-10-CM | POA: Diagnosis not present

## 2013-04-25 DIAGNOSIS — H04129 Dry eye syndrome of unspecified lacrimal gland: Secondary | ICD-10-CM | POA: Diagnosis not present

## 2013-04-25 DIAGNOSIS — Z885 Allergy status to narcotic agent status: Secondary | ICD-10-CM | POA: Diagnosis not present

## 2013-04-25 DIAGNOSIS — J449 Chronic obstructive pulmonary disease, unspecified: Secondary | ICD-10-CM | POA: Diagnosis not present

## 2013-04-25 DIAGNOSIS — I252 Old myocardial infarction: Secondary | ICD-10-CM | POA: Diagnosis not present

## 2013-04-25 DIAGNOSIS — Z951 Presence of aortocoronary bypass graft: Secondary | ICD-10-CM | POA: Diagnosis not present

## 2013-04-25 DIAGNOSIS — I10 Essential (primary) hypertension: Secondary | ICD-10-CM | POA: Diagnosis present

## 2013-04-25 DIAGNOSIS — Z888 Allergy status to other drugs, medicaments and biological substances status: Secondary | ICD-10-CM | POA: Diagnosis not present

## 2013-04-25 DIAGNOSIS — R079 Chest pain, unspecified: Secondary | ICD-10-CM | POA: Diagnosis not present

## 2013-04-25 DIAGNOSIS — F0390 Unspecified dementia without behavioral disturbance: Secondary | ICD-10-CM | POA: Diagnosis not present

## 2013-04-25 DIAGNOSIS — E039 Hypothyroidism, unspecified: Secondary | ICD-10-CM | POA: Diagnosis present

## 2013-04-25 DIAGNOSIS — E860 Dehydration: Secondary | ICD-10-CM | POA: Diagnosis present

## 2013-04-25 DIAGNOSIS — N179 Acute kidney failure, unspecified: Secondary | ICD-10-CM | POA: Diagnosis present

## 2013-04-25 DIAGNOSIS — I4891 Unspecified atrial fibrillation: Secondary | ICD-10-CM | POA: Diagnosis present

## 2013-04-25 DIAGNOSIS — Z886 Allergy status to analgesic agent status: Secondary | ICD-10-CM | POA: Diagnosis not present

## 2013-04-25 DIAGNOSIS — H16229 Keratoconjunctivitis sicca, not specified as Sjogren's, unspecified eye: Secondary | ICD-10-CM | POA: Diagnosis not present

## 2013-04-25 DIAGNOSIS — Z882 Allergy status to sulfonamides status: Secondary | ICD-10-CM | POA: Diagnosis not present

## 2013-04-25 DIAGNOSIS — IMO0002 Reserved for concepts with insufficient information to code with codable children: Secondary | ICD-10-CM | POA: Diagnosis not present

## 2013-04-25 DIAGNOSIS — F0391 Unspecified dementia with behavioral disturbance: Secondary | ICD-10-CM | POA: Diagnosis present

## 2013-04-25 DIAGNOSIS — Z7901 Long term (current) use of anticoagulants: Secondary | ICD-10-CM | POA: Diagnosis not present

## 2013-04-25 DIAGNOSIS — J4489 Other specified chronic obstructive pulmonary disease: Secondary | ICD-10-CM | POA: Diagnosis present

## 2013-04-25 DIAGNOSIS — F319 Bipolar disorder, unspecified: Secondary | ICD-10-CM | POA: Diagnosis not present

## 2013-04-25 DIAGNOSIS — E785 Hyperlipidemia, unspecified: Secondary | ICD-10-CM | POA: Diagnosis present

## 2013-04-25 DIAGNOSIS — Z950 Presence of cardiac pacemaker: Secondary | ICD-10-CM | POA: Diagnosis not present

## 2013-04-25 DIAGNOSIS — N390 Urinary tract infection, site not specified: Secondary | ICD-10-CM | POA: Diagnosis present

## 2013-04-25 DIAGNOSIS — F309 Manic episode, unspecified: Secondary | ICD-10-CM | POA: Diagnosis present

## 2013-04-25 DIAGNOSIS — F29 Unspecified psychosis not due to a substance or known physiological condition: Secondary | ICD-10-CM | POA: Diagnosis not present

## 2013-04-25 DIAGNOSIS — I2510 Atherosclerotic heart disease of native coronary artery without angina pectoris: Secondary | ICD-10-CM | POA: Diagnosis present

## 2013-05-10 DIAGNOSIS — E039 Hypothyroidism, unspecified: Secondary | ICD-10-CM | POA: Diagnosis not present

## 2013-05-10 DIAGNOSIS — E1149 Type 2 diabetes mellitus with other diabetic neurological complication: Secondary | ICD-10-CM | POA: Diagnosis not present

## 2013-05-10 DIAGNOSIS — I4891 Unspecified atrial fibrillation: Secondary | ICD-10-CM | POA: Diagnosis not present

## 2013-05-10 DIAGNOSIS — E785 Hyperlipidemia, unspecified: Secondary | ICD-10-CM | POA: Diagnosis not present

## 2013-05-10 DIAGNOSIS — I10 Essential (primary) hypertension: Secondary | ICD-10-CM | POA: Diagnosis not present

## 2013-05-10 DIAGNOSIS — J449 Chronic obstructive pulmonary disease, unspecified: Secondary | ICD-10-CM | POA: Diagnosis not present

## 2013-05-11 ENCOUNTER — Ambulatory Visit: Payer: Medicare Other | Admitting: Internal Medicine

## 2013-05-11 DIAGNOSIS — H538 Other visual disturbances: Secondary | ICD-10-CM | POA: Diagnosis not present

## 2013-05-11 DIAGNOSIS — F0391 Unspecified dementia with behavioral disturbance: Secondary | ICD-10-CM | POA: Diagnosis not present

## 2013-05-11 DIAGNOSIS — E119 Type 2 diabetes mellitus without complications: Secondary | ICD-10-CM | POA: Diagnosis not present

## 2013-05-11 DIAGNOSIS — Z950 Presence of cardiac pacemaker: Secondary | ICD-10-CM | POA: Diagnosis not present

## 2013-05-11 DIAGNOSIS — I509 Heart failure, unspecified: Secondary | ICD-10-CM | POA: Diagnosis not present

## 2013-05-11 DIAGNOSIS — F03918 Unspecified dementia, unspecified severity, with other behavioral disturbance: Secondary | ICD-10-CM | POA: Diagnosis not present

## 2013-05-11 DIAGNOSIS — J441 Chronic obstructive pulmonary disease with (acute) exacerbation: Secondary | ICD-10-CM | POA: Diagnosis not present

## 2013-05-15 ENCOUNTER — Ambulatory Visit: Payer: Medicare Other | Admitting: Internal Medicine

## 2013-05-16 DIAGNOSIS — F0391 Unspecified dementia with behavioral disturbance: Secondary | ICD-10-CM | POA: Diagnosis not present

## 2013-05-16 DIAGNOSIS — J441 Chronic obstructive pulmonary disease with (acute) exacerbation: Secondary | ICD-10-CM | POA: Diagnosis not present

## 2013-05-16 DIAGNOSIS — Z950 Presence of cardiac pacemaker: Secondary | ICD-10-CM | POA: Diagnosis not present

## 2013-05-16 DIAGNOSIS — F03918 Unspecified dementia, unspecified severity, with other behavioral disturbance: Secondary | ICD-10-CM | POA: Diagnosis not present

## 2013-05-16 DIAGNOSIS — E119 Type 2 diabetes mellitus without complications: Secondary | ICD-10-CM | POA: Diagnosis not present

## 2013-05-16 DIAGNOSIS — I509 Heart failure, unspecified: Secondary | ICD-10-CM | POA: Diagnosis not present

## 2013-05-16 DIAGNOSIS — H538 Other visual disturbances: Secondary | ICD-10-CM | POA: Diagnosis not present

## 2013-05-17 ENCOUNTER — Encounter: Payer: Self-pay | Admitting: Internal Medicine

## 2013-05-18 DIAGNOSIS — F03918 Unspecified dementia, unspecified severity, with other behavioral disturbance: Secondary | ICD-10-CM | POA: Diagnosis not present

## 2013-05-18 DIAGNOSIS — H538 Other visual disturbances: Secondary | ICD-10-CM | POA: Diagnosis not present

## 2013-05-18 DIAGNOSIS — E119 Type 2 diabetes mellitus without complications: Secondary | ICD-10-CM | POA: Diagnosis not present

## 2013-05-18 DIAGNOSIS — F0391 Unspecified dementia with behavioral disturbance: Secondary | ICD-10-CM | POA: Diagnosis not present

## 2013-05-18 DIAGNOSIS — Z950 Presence of cardiac pacemaker: Secondary | ICD-10-CM | POA: Diagnosis not present

## 2013-05-18 DIAGNOSIS — J441 Chronic obstructive pulmonary disease with (acute) exacerbation: Secondary | ICD-10-CM | POA: Diagnosis not present

## 2013-05-18 DIAGNOSIS — I509 Heart failure, unspecified: Secondary | ICD-10-CM | POA: Diagnosis not present

## 2013-05-23 DIAGNOSIS — J441 Chronic obstructive pulmonary disease with (acute) exacerbation: Secondary | ICD-10-CM | POA: Diagnosis not present

## 2013-05-23 DIAGNOSIS — F0391 Unspecified dementia with behavioral disturbance: Secondary | ICD-10-CM | POA: Diagnosis not present

## 2013-05-23 DIAGNOSIS — I509 Heart failure, unspecified: Secondary | ICD-10-CM | POA: Diagnosis not present

## 2013-05-23 DIAGNOSIS — F03918 Unspecified dementia, unspecified severity, with other behavioral disturbance: Secondary | ICD-10-CM | POA: Diagnosis not present

## 2013-05-23 DIAGNOSIS — H538 Other visual disturbances: Secondary | ICD-10-CM | POA: Diagnosis not present

## 2013-05-23 DIAGNOSIS — E119 Type 2 diabetes mellitus without complications: Secondary | ICD-10-CM | POA: Diagnosis not present

## 2013-05-23 DIAGNOSIS — Z950 Presence of cardiac pacemaker: Secondary | ICD-10-CM | POA: Diagnosis not present

## 2013-05-26 ENCOUNTER — Encounter: Payer: Medicare Other | Admitting: Internal Medicine

## 2013-05-26 DIAGNOSIS — F0391 Unspecified dementia with behavioral disturbance: Secondary | ICD-10-CM | POA: Diagnosis not present

## 2013-05-26 DIAGNOSIS — E119 Type 2 diabetes mellitus without complications: Secondary | ICD-10-CM | POA: Diagnosis not present

## 2013-05-26 DIAGNOSIS — J441 Chronic obstructive pulmonary disease with (acute) exacerbation: Secondary | ICD-10-CM | POA: Diagnosis not present

## 2013-05-26 DIAGNOSIS — I509 Heart failure, unspecified: Secondary | ICD-10-CM | POA: Diagnosis not present

## 2013-05-26 DIAGNOSIS — F03918 Unspecified dementia, unspecified severity, with other behavioral disturbance: Secondary | ICD-10-CM | POA: Diagnosis not present

## 2013-05-26 DIAGNOSIS — H538 Other visual disturbances: Secondary | ICD-10-CM | POA: Diagnosis not present

## 2013-05-26 DIAGNOSIS — Z950 Presence of cardiac pacemaker: Secondary | ICD-10-CM | POA: Diagnosis not present

## 2013-05-29 DIAGNOSIS — I509 Heart failure, unspecified: Secondary | ICD-10-CM | POA: Diagnosis not present

## 2013-05-29 DIAGNOSIS — J441 Chronic obstructive pulmonary disease with (acute) exacerbation: Secondary | ICD-10-CM | POA: Diagnosis not present

## 2013-05-29 DIAGNOSIS — E119 Type 2 diabetes mellitus without complications: Secondary | ICD-10-CM | POA: Diagnosis not present

## 2013-05-29 DIAGNOSIS — F03918 Unspecified dementia, unspecified severity, with other behavioral disturbance: Secondary | ICD-10-CM | POA: Diagnosis not present

## 2013-05-29 DIAGNOSIS — Z950 Presence of cardiac pacemaker: Secondary | ICD-10-CM | POA: Diagnosis not present

## 2013-05-29 DIAGNOSIS — H538 Other visual disturbances: Secondary | ICD-10-CM | POA: Diagnosis not present

## 2013-05-29 DIAGNOSIS — F0391 Unspecified dementia with behavioral disturbance: Secondary | ICD-10-CM | POA: Diagnosis not present

## 2013-06-07 DIAGNOSIS — E119 Type 2 diabetes mellitus without complications: Secondary | ICD-10-CM | POA: Diagnosis not present

## 2013-06-07 DIAGNOSIS — F03918 Unspecified dementia, unspecified severity, with other behavioral disturbance: Secondary | ICD-10-CM | POA: Diagnosis not present

## 2013-06-07 DIAGNOSIS — Z950 Presence of cardiac pacemaker: Secondary | ICD-10-CM | POA: Diagnosis not present

## 2013-06-07 DIAGNOSIS — F0391 Unspecified dementia with behavioral disturbance: Secondary | ICD-10-CM | POA: Diagnosis not present

## 2013-06-07 DIAGNOSIS — H538 Other visual disturbances: Secondary | ICD-10-CM | POA: Diagnosis not present

## 2013-06-07 DIAGNOSIS — I509 Heart failure, unspecified: Secondary | ICD-10-CM | POA: Diagnosis not present

## 2013-06-07 DIAGNOSIS — J441 Chronic obstructive pulmonary disease with (acute) exacerbation: Secondary | ICD-10-CM | POA: Diagnosis not present

## 2013-06-12 ENCOUNTER — Ambulatory Visit: Payer: Medicare Other | Admitting: Internal Medicine

## 2013-06-12 ENCOUNTER — Encounter: Payer: Medicare Other | Admitting: Cardiology

## 2013-06-12 NOTE — Progress Notes (Signed)
HPI: FU atrial fibrillation and CAD. She is s/p CABG. She also has a history of permanent atrial fibrillation and has had prior AV node ablation/pacemaker. There was also a question of prior von Willebrand disease. 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 Echocardiogram in November of 2013 showed an ejection fraction of 35-40%. There was akinesis of the mid and distal anteroseptal myocardium. The left atrium was moderate to severely dilated. There was also moderate to severe dilatation of the right atrium and moderate reduction in RV function. There was mild mitral regurgitation. Myoview in November of 2013 showed an ejection fraction of 61%. There was mild apical ischemia. Patient last seen in Jan 2014. Since then,    Current Outpatient Prescriptions  Medication Sig Dispense Refill  . albuterol (PROVENTIL HFA;VENTOLIN HFA) 108 (90 BASE) MCG/ACT inhaler Inhale 2-4 puffs into the lungs every 2 (two) hours as needed for wheezing or shortness of breath.  1 Inhaler  0  . albuterol (PROVENTIL) (2.5 MG/3ML) 0.083% nebulizer solution Take 3 mLs (2.5 mg total) by nebulization every 6 (six) hours as needed for wheezing or shortness of breath. For wheezing or shortness of breath  75 mL  prn  . ALPRAZolam (XANAX) 0.5 MG tablet Take 0.5 tablets (0.25 mg total) by mouth 2 (two) times daily as needed for sleep or anxiety.  20 tablet  1  . budesonide (PULMICORT) 0.25 MG/2ML nebulizer solution Take 2 mLs (0.25 mg total) by nebulization 2 (two) times daily.  60 mL  12  . diltiazem (CARDIZEM CD) 120 MG 24 hr capsule Take 120 mg by mouth daily.      . ergocalciferol (VITAMIN D2) 50000 UNITS capsule Take 50,000 Units by mouth once a week.      . furosemide (LASIX) 40 MG tablet Take 1 tablet (40 mg total) by mouth 2 (two) times daily.  10 tablet  0  . guaiFENesin (MUCINEX) 600 MG 12 hr tablet Take 600-1,200 mg by mouth  daily as needed. For congestion      . insulin glargine (LANTUS) 100 UNIT/ML injection Inject 60 Units into the skin at bedtime.      . Insulin Lispro, Human, (HUMALOG KWIKPEN [place]) Inject 12 Units into the skin 3 (three) times daily.       Marland Kitchen levothyroxine (SYNTHROID, LEVOTHROID) 200 MCG tablet Take 200 mcg by mouth daily.      . metoprolol succinate (TOPROL-XL) 100 MG 24 hr tablet Take 50 mg by mouth daily. Take with or immediately following a meal.      . nitroGLYCERIN (NITROSTAT) 0.4 MG SL tablet Place 0.4 mg under the tongue every 5 (five) minutes as needed. For chest pain.      . potassium chloride (K-DUR) 10 MEQ tablet Take 10 mEq by mouth daily.      . rosuvastatin (CRESTOR) 10 MG tablet Take 10 mg by mouth daily.      . sertraline (ZOLOFT) 100 MG tablet Take 100 mg by mouth daily.        Marland Kitchen warfarin (COUMADIN) 5 MG tablet as directed.        No current facility-administered medications for this visit.     Past Medical History  Diagnosis Date  . Chronic combined systolic and diastolic heart failure     a. Echocardiogram 10/12: Septal and apical HK, mild focal basal hypertrophy the septum, EF 45-50%, mild MR,  moderate LAE, mild RAE, PASP 39;  b. Echo 11/13: mild LVH, focal basal hypertrophy, EF 35-40%, mid to dist Ant-Septal AK, MAC, mild MR, mod to sev BAE, mod reduced RVSF, PASP 47  . Cardiac pacemaker in situ   . Anemia, pernicious   . Atrial fibrillation     S/P AV node ablation; coumadin managed by PCP  . Von Willebrand's disease   . Gallbladder disease   . Hypothyroidism   . Diabetes mellitus   . CAD (coronary artery disease)     a. s/p CABG;  b. Myoview 11/11: inferior scar with mild peri-infarct ischemia, EF 45%;   c. Lex MV 11/13: mild apical ischemia, apical HK, EF 61%  . COPD (chronic obstructive pulmonary disease)     Past Surgical History  Procedure Laterality Date  . Coronary artery bypass graft    . Mandibular reconstruction w/ iliac bone graft    . Appendectomy     . Hernia repair      History   Social History  . Marital Status: Married    Spouse Name: N/A    Number of Children: N/A  . Years of Education: N/A   Occupational History  . Not on file.   Social History Main Topics  . Smoking status: Former Smoker    Quit date: 03/29/1989  . Smokeless tobacco: Not on file  . Alcohol Use: No  . Drug Use: Not on file  . Sexual Activity: Yes   Other Topics Concern  . Not on file   Social History Narrative   Lives with daugher    ROS: no fevers or chills, productive cough, hemoptysis, dysphasia, odynophagia, melena, hematochezia, dysuria, hematuria, rash, seizure activity, orthopnea, PND, pedal edema, claudication. Remaining systems are negative.  Physical Exam: Well-developed well-nourished in no acute distress.  Skin is warm and dry.  HEENT is normal.  Neck is supple.  Chest is clear to auscultation with normal expansion.  Cardiovascular exam is regular rate and rhythm.  Abdominal exam nontender or distended. No masses palpated. Extremities show no edema. neuro grossly intact  ECG     This encounter was created in error - please disregard.

## 2013-06-16 ENCOUNTER — Encounter: Payer: Self-pay | Admitting: Cardiology

## 2013-06-17 DIAGNOSIS — J441 Chronic obstructive pulmonary disease with (acute) exacerbation: Secondary | ICD-10-CM | POA: Diagnosis not present

## 2013-06-17 DIAGNOSIS — H538 Other visual disturbances: Secondary | ICD-10-CM | POA: Diagnosis not present

## 2013-06-17 DIAGNOSIS — F03918 Unspecified dementia, unspecified severity, with other behavioral disturbance: Secondary | ICD-10-CM | POA: Diagnosis not present

## 2013-06-17 DIAGNOSIS — F0391 Unspecified dementia with behavioral disturbance: Secondary | ICD-10-CM | POA: Diagnosis not present

## 2013-06-17 DIAGNOSIS — I509 Heart failure, unspecified: Secondary | ICD-10-CM | POA: Diagnosis not present

## 2013-06-17 DIAGNOSIS — Z950 Presence of cardiac pacemaker: Secondary | ICD-10-CM | POA: Diagnosis not present

## 2013-06-17 DIAGNOSIS — E119 Type 2 diabetes mellitus without complications: Secondary | ICD-10-CM | POA: Diagnosis not present

## 2013-07-08 DIAGNOSIS — R0602 Shortness of breath: Secondary | ICD-10-CM | POA: Diagnosis not present

## 2013-07-08 DIAGNOSIS — R0989 Other specified symptoms and signs involving the circulatory and respiratory systems: Secondary | ICD-10-CM | POA: Diagnosis not present

## 2013-07-08 DIAGNOSIS — R4182 Altered mental status, unspecified: Secondary | ICD-10-CM | POA: Diagnosis not present

## 2013-07-08 DIAGNOSIS — R059 Cough, unspecified: Secondary | ICD-10-CM | POA: Diagnosis not present

## 2013-07-08 DIAGNOSIS — R05 Cough: Secondary | ICD-10-CM | POA: Diagnosis not present

## 2013-07-08 DIAGNOSIS — R6889 Other general symptoms and signs: Secondary | ICD-10-CM | POA: Diagnosis not present

## 2013-07-08 DIAGNOSIS — N390 Urinary tract infection, site not specified: Secondary | ICD-10-CM | POA: Diagnosis not present

## 2013-09-25 ENCOUNTER — Encounter: Payer: Self-pay | Admitting: Cardiology

## 2013-09-27 ENCOUNTER — Telehealth: Payer: Self-pay | Admitting: Internal Medicine

## 2013-09-27 NOTE — Telephone Encounter (Addendum)
07-27-26 sent certified letter re past due device ck/mt Certified letter signed , unable to read signature, 10-02-13/mt

## 2013-10-19 ENCOUNTER — Encounter: Payer: Self-pay | Admitting: *Deleted

## 2013-10-24 DIAGNOSIS — I10 Essential (primary) hypertension: Secondary | ICD-10-CM | POA: Diagnosis not present

## 2013-10-24 DIAGNOSIS — E039 Hypothyroidism, unspecified: Secondary | ICD-10-CM | POA: Diagnosis not present

## 2013-10-24 DIAGNOSIS — I4891 Unspecified atrial fibrillation: Secondary | ICD-10-CM | POA: Diagnosis not present

## 2013-10-24 DIAGNOSIS — E1149 Type 2 diabetes mellitus with other diabetic neurological complication: Secondary | ICD-10-CM | POA: Diagnosis not present

## 2013-10-24 DIAGNOSIS — E538 Deficiency of other specified B group vitamins: Secondary | ICD-10-CM | POA: Diagnosis not present

## 2013-10-24 DIAGNOSIS — E559 Vitamin D deficiency, unspecified: Secondary | ICD-10-CM | POA: Diagnosis not present

## 2014-01-02 ENCOUNTER — Encounter: Payer: Medicare Other | Admitting: Internal Medicine

## 2014-01-02 DIAGNOSIS — R05 Cough: Secondary | ICD-10-CM | POA: Diagnosis not present

## 2014-01-02 DIAGNOSIS — J438 Other emphysema: Secondary | ICD-10-CM | POA: Diagnosis not present

## 2014-01-02 DIAGNOSIS — I10 Essential (primary) hypertension: Secondary | ICD-10-CM | POA: Diagnosis not present

## 2014-01-02 DIAGNOSIS — E7800 Pure hypercholesterolemia, unspecified: Secondary | ICD-10-CM | POA: Diagnosis not present

## 2014-01-02 DIAGNOSIS — I509 Heart failure, unspecified: Secondary | ICD-10-CM | POA: Diagnosis not present

## 2014-01-02 DIAGNOSIS — Z7901 Long term (current) use of anticoagulants: Secondary | ICD-10-CM | POA: Diagnosis not present

## 2014-01-02 DIAGNOSIS — R0602 Shortness of breath: Secondary | ICD-10-CM | POA: Diagnosis not present

## 2014-01-02 DIAGNOSIS — K219 Gastro-esophageal reflux disease without esophagitis: Secondary | ICD-10-CM | POA: Diagnosis not present

## 2014-01-02 DIAGNOSIS — F0390 Unspecified dementia without behavioral disturbance: Secondary | ICD-10-CM | POA: Diagnosis not present

## 2014-01-02 DIAGNOSIS — I2510 Atherosclerotic heart disease of native coronary artery without angina pectoris: Secondary | ICD-10-CM | POA: Diagnosis not present

## 2014-01-02 DIAGNOSIS — I4891 Unspecified atrial fibrillation: Secondary | ICD-10-CM | POA: Diagnosis not present

## 2014-01-02 DIAGNOSIS — R059 Cough, unspecified: Secondary | ICD-10-CM | POA: Diagnosis not present

## 2014-01-02 DIAGNOSIS — Z79899 Other long term (current) drug therapy: Secondary | ICD-10-CM | POA: Diagnosis not present

## 2014-01-02 DIAGNOSIS — Z794 Long term (current) use of insulin: Secondary | ICD-10-CM | POA: Diagnosis not present

## 2014-01-02 DIAGNOSIS — E119 Type 2 diabetes mellitus without complications: Secondary | ICD-10-CM | POA: Diagnosis not present

## 2014-01-02 DIAGNOSIS — N390 Urinary tract infection, site not specified: Secondary | ICD-10-CM | POA: Diagnosis not present

## 2014-01-02 DIAGNOSIS — E039 Hypothyroidism, unspecified: Secondary | ICD-10-CM | POA: Diagnosis not present

## 2014-01-02 DIAGNOSIS — Z950 Presence of cardiac pacemaker: Secondary | ICD-10-CM | POA: Diagnosis not present

## 2014-01-02 DIAGNOSIS — Z9981 Dependence on supplemental oxygen: Secondary | ICD-10-CM | POA: Diagnosis not present

## 2014-01-09 ENCOUNTER — Encounter: Payer: Self-pay | Admitting: Internal Medicine

## 2014-01-19 DIAGNOSIS — A498 Other bacterial infections of unspecified site: Secondary | ICD-10-CM | POA: Diagnosis present

## 2014-01-19 DIAGNOSIS — R791 Abnormal coagulation profile: Secondary | ICD-10-CM | POA: Diagnosis present

## 2014-01-19 DIAGNOSIS — E1169 Type 2 diabetes mellitus with other specified complication: Secondary | ICD-10-CM | POA: Diagnosis not present

## 2014-01-19 DIAGNOSIS — I252 Old myocardial infarction: Secondary | ICD-10-CM | POA: Diagnosis not present

## 2014-01-19 DIAGNOSIS — I10 Essential (primary) hypertension: Secondary | ICD-10-CM | POA: Diagnosis present

## 2014-01-19 DIAGNOSIS — Z794 Long term (current) use of insulin: Secondary | ICD-10-CM | POA: Diagnosis not present

## 2014-01-19 DIAGNOSIS — R6520 Severe sepsis without septic shock: Secondary | ICD-10-CM | POA: Diagnosis not present

## 2014-01-19 DIAGNOSIS — G929 Unspecified toxic encephalopathy: Secondary | ICD-10-CM | POA: Diagnosis not present

## 2014-01-19 DIAGNOSIS — G92 Toxic encephalopathy: Secondary | ICD-10-CM | POA: Diagnosis not present

## 2014-01-19 DIAGNOSIS — D684 Acquired coagulation factor deficiency: Secondary | ICD-10-CM | POA: Diagnosis not present

## 2014-01-19 DIAGNOSIS — Z95810 Presence of automatic (implantable) cardiac defibrillator: Secondary | ICD-10-CM | POA: Diagnosis not present

## 2014-01-19 DIAGNOSIS — E039 Hypothyroidism, unspecified: Secondary | ICD-10-CM | POA: Diagnosis present

## 2014-01-19 DIAGNOSIS — E161 Other hypoglycemia: Secondary | ICD-10-CM | POA: Diagnosis not present

## 2014-01-19 DIAGNOSIS — I4891 Unspecified atrial fibrillation: Secondary | ICD-10-CM | POA: Diagnosis present

## 2014-01-19 DIAGNOSIS — R5383 Other fatigue: Secondary | ICD-10-CM | POA: Diagnosis not present

## 2014-01-19 DIAGNOSIS — Z9981 Dependence on supplemental oxygen: Secondary | ICD-10-CM | POA: Diagnosis not present

## 2014-01-19 DIAGNOSIS — G9341 Metabolic encephalopathy: Secondary | ICD-10-CM | POA: Diagnosis not present

## 2014-01-19 DIAGNOSIS — E785 Hyperlipidemia, unspecified: Secondary | ICD-10-CM | POA: Diagnosis present

## 2014-01-19 DIAGNOSIS — A419 Sepsis, unspecified organism: Secondary | ICD-10-CM | POA: Diagnosis not present

## 2014-01-19 DIAGNOSIS — Z23 Encounter for immunization: Secondary | ICD-10-CM | POA: Diagnosis not present

## 2014-01-19 DIAGNOSIS — E876 Hypokalemia: Secondary | ICD-10-CM | POA: Diagnosis present

## 2014-01-19 DIAGNOSIS — I509 Heart failure, unspecified: Secondary | ICD-10-CM | POA: Diagnosis present

## 2014-01-19 DIAGNOSIS — R680 Hypothermia, not associated with low environmental temperature: Secondary | ICD-10-CM | POA: Diagnosis not present

## 2014-01-19 DIAGNOSIS — R5381 Other malaise: Secondary | ICD-10-CM | POA: Diagnosis not present

## 2014-01-19 DIAGNOSIS — J984 Other disorders of lung: Secondary | ICD-10-CM | POA: Diagnosis not present

## 2014-01-19 DIAGNOSIS — J9610 Chronic respiratory failure, unspecified whether with hypoxia or hypercapnia: Secondary | ICD-10-CM | POA: Diagnosis not present

## 2014-01-19 DIAGNOSIS — J449 Chronic obstructive pulmonary disease, unspecified: Secondary | ICD-10-CM | POA: Diagnosis present

## 2014-01-19 DIAGNOSIS — N390 Urinary tract infection, site not specified: Secondary | ICD-10-CM | POA: Diagnosis not present

## 2014-01-19 DIAGNOSIS — Z951 Presence of aortocoronary bypass graft: Secondary | ICD-10-CM | POA: Diagnosis not present

## 2014-01-19 DIAGNOSIS — K219 Gastro-esophageal reflux disease without esophagitis: Secondary | ICD-10-CM | POA: Diagnosis present

## 2014-01-19 DIAGNOSIS — R0602 Shortness of breath: Secondary | ICD-10-CM | POA: Diagnosis not present

## 2014-01-19 DIAGNOSIS — I2510 Atherosclerotic heart disease of native coronary artery without angina pectoris: Secondary | ICD-10-CM | POA: Diagnosis present

## 2014-01-19 DIAGNOSIS — F0390 Unspecified dementia without behavioral disturbance: Secondary | ICD-10-CM | POA: Diagnosis present

## 2014-01-26 DIAGNOSIS — Z794 Long term (current) use of insulin: Secondary | ICD-10-CM | POA: Diagnosis not present

## 2014-01-26 DIAGNOSIS — I4891 Unspecified atrial fibrillation: Secondary | ICD-10-CM | POA: Diagnosis not present

## 2014-01-26 DIAGNOSIS — N390 Urinary tract infection, site not specified: Secondary | ICD-10-CM | POA: Diagnosis not present

## 2014-01-26 DIAGNOSIS — I509 Heart failure, unspecified: Secondary | ICD-10-CM | POA: Diagnosis not present

## 2014-01-26 DIAGNOSIS — Z7901 Long term (current) use of anticoagulants: Secondary | ICD-10-CM | POA: Diagnosis not present

## 2014-01-26 DIAGNOSIS — E118 Type 2 diabetes mellitus with unspecified complications: Secondary | ICD-10-CM | POA: Diagnosis not present

## 2014-01-26 DIAGNOSIS — J449 Chronic obstructive pulmonary disease, unspecified: Secondary | ICD-10-CM | POA: Diagnosis not present

## 2014-01-29 DIAGNOSIS — N390 Urinary tract infection, site not specified: Secondary | ICD-10-CM | POA: Diagnosis not present

## 2014-01-29 DIAGNOSIS — Z794 Long term (current) use of insulin: Secondary | ICD-10-CM | POA: Diagnosis not present

## 2014-01-29 DIAGNOSIS — J449 Chronic obstructive pulmonary disease, unspecified: Secondary | ICD-10-CM | POA: Diagnosis not present

## 2014-01-29 DIAGNOSIS — E118 Type 2 diabetes mellitus with unspecified complications: Secondary | ICD-10-CM | POA: Diagnosis not present

## 2014-01-29 DIAGNOSIS — I509 Heart failure, unspecified: Secondary | ICD-10-CM | POA: Diagnosis not present

## 2014-01-30 ENCOUNTER — Ambulatory Visit: Payer: Medicare Other | Admitting: Cardiology

## 2014-01-30 DIAGNOSIS — I509 Heart failure, unspecified: Secondary | ICD-10-CM | POA: Diagnosis not present

## 2014-01-30 DIAGNOSIS — J449 Chronic obstructive pulmonary disease, unspecified: Secondary | ICD-10-CM | POA: Diagnosis not present

## 2014-01-30 DIAGNOSIS — E118 Type 2 diabetes mellitus with unspecified complications: Secondary | ICD-10-CM | POA: Diagnosis not present

## 2014-01-30 DIAGNOSIS — N390 Urinary tract infection, site not specified: Secondary | ICD-10-CM | POA: Diagnosis not present

## 2014-01-30 DIAGNOSIS — Z794 Long term (current) use of insulin: Secondary | ICD-10-CM | POA: Diagnosis not present

## 2014-01-31 DIAGNOSIS — A419 Sepsis, unspecified organism: Secondary | ICD-10-CM | POA: Diagnosis not present

## 2014-01-31 DIAGNOSIS — Z7901 Long term (current) use of anticoagulants: Secondary | ICD-10-CM | POA: Diagnosis not present

## 2014-01-31 DIAGNOSIS — Z09 Encounter for follow-up examination after completed treatment for conditions other than malignant neoplasm: Secondary | ICD-10-CM | POA: Diagnosis not present

## 2014-02-01 DIAGNOSIS — N390 Urinary tract infection, site not specified: Secondary | ICD-10-CM | POA: Diagnosis not present

## 2014-02-01 DIAGNOSIS — J449 Chronic obstructive pulmonary disease, unspecified: Secondary | ICD-10-CM | POA: Diagnosis not present

## 2014-02-01 DIAGNOSIS — I509 Heart failure, unspecified: Secondary | ICD-10-CM | POA: Diagnosis not present

## 2014-02-01 DIAGNOSIS — Z794 Long term (current) use of insulin: Secondary | ICD-10-CM | POA: Diagnosis not present

## 2014-02-01 DIAGNOSIS — E118 Type 2 diabetes mellitus with unspecified complications: Secondary | ICD-10-CM | POA: Diagnosis not present

## 2014-02-02 DIAGNOSIS — Z794 Long term (current) use of insulin: Secondary | ICD-10-CM | POA: Diagnosis not present

## 2014-02-02 DIAGNOSIS — I509 Heart failure, unspecified: Secondary | ICD-10-CM | POA: Diagnosis not present

## 2014-02-02 DIAGNOSIS — J449 Chronic obstructive pulmonary disease, unspecified: Secondary | ICD-10-CM | POA: Diagnosis not present

## 2014-02-02 DIAGNOSIS — E118 Type 2 diabetes mellitus with unspecified complications: Secondary | ICD-10-CM | POA: Diagnosis not present

## 2014-02-02 DIAGNOSIS — N390 Urinary tract infection, site not specified: Secondary | ICD-10-CM | POA: Diagnosis not present

## 2014-02-06 DIAGNOSIS — E118 Type 2 diabetes mellitus with unspecified complications: Secondary | ICD-10-CM | POA: Diagnosis not present

## 2014-02-06 DIAGNOSIS — I509 Heart failure, unspecified: Secondary | ICD-10-CM | POA: Diagnosis not present

## 2014-02-06 DIAGNOSIS — N390 Urinary tract infection, site not specified: Secondary | ICD-10-CM | POA: Diagnosis not present

## 2014-02-06 DIAGNOSIS — J449 Chronic obstructive pulmonary disease, unspecified: Secondary | ICD-10-CM | POA: Diagnosis not present

## 2014-02-06 DIAGNOSIS — Z794 Long term (current) use of insulin: Secondary | ICD-10-CM | POA: Diagnosis not present

## 2014-02-09 DIAGNOSIS — I4891 Unspecified atrial fibrillation: Secondary | ICD-10-CM | POA: Diagnosis not present

## 2014-02-09 DIAGNOSIS — Z794 Long term (current) use of insulin: Secondary | ICD-10-CM | POA: Diagnosis not present

## 2014-02-09 DIAGNOSIS — E118 Type 2 diabetes mellitus with unspecified complications: Secondary | ICD-10-CM | POA: Diagnosis not present

## 2014-02-09 DIAGNOSIS — N390 Urinary tract infection, site not specified: Secondary | ICD-10-CM | POA: Diagnosis not present

## 2014-02-09 DIAGNOSIS — Z5181 Encounter for therapeutic drug level monitoring: Secondary | ICD-10-CM | POA: Diagnosis not present

## 2014-02-09 DIAGNOSIS — I509 Heart failure, unspecified: Secondary | ICD-10-CM | POA: Diagnosis not present

## 2014-02-09 DIAGNOSIS — J449 Chronic obstructive pulmonary disease, unspecified: Secondary | ICD-10-CM | POA: Diagnosis not present

## 2014-02-09 DIAGNOSIS — Z7901 Long term (current) use of anticoagulants: Secondary | ICD-10-CM | POA: Diagnosis not present

## 2014-02-12 DIAGNOSIS — E118 Type 2 diabetes mellitus with unspecified complications: Secondary | ICD-10-CM | POA: Diagnosis not present

## 2014-02-12 DIAGNOSIS — J449 Chronic obstructive pulmonary disease, unspecified: Secondary | ICD-10-CM | POA: Diagnosis not present

## 2014-02-12 DIAGNOSIS — Z794 Long term (current) use of insulin: Secondary | ICD-10-CM | POA: Diagnosis not present

## 2014-02-12 DIAGNOSIS — N390 Urinary tract infection, site not specified: Secondary | ICD-10-CM | POA: Diagnosis not present

## 2014-02-12 DIAGNOSIS — I509 Heart failure, unspecified: Secondary | ICD-10-CM | POA: Diagnosis not present

## 2014-02-15 DIAGNOSIS — R300 Dysuria: Secondary | ICD-10-CM | POA: Diagnosis not present

## 2014-02-15 DIAGNOSIS — I509 Heart failure, unspecified: Secondary | ICD-10-CM | POA: Diagnosis not present

## 2014-02-15 DIAGNOSIS — J449 Chronic obstructive pulmonary disease, unspecified: Secondary | ICD-10-CM | POA: Diagnosis not present

## 2014-02-15 DIAGNOSIS — N390 Urinary tract infection, site not specified: Secondary | ICD-10-CM | POA: Diagnosis not present

## 2014-02-15 DIAGNOSIS — E118 Type 2 diabetes mellitus with unspecified complications: Secondary | ICD-10-CM | POA: Diagnosis not present

## 2014-02-15 DIAGNOSIS — Z794 Long term (current) use of insulin: Secondary | ICD-10-CM | POA: Diagnosis not present

## 2014-02-19 DIAGNOSIS — N390 Urinary tract infection, site not specified: Secondary | ICD-10-CM | POA: Diagnosis not present

## 2014-02-19 DIAGNOSIS — Z794 Long term (current) use of insulin: Secondary | ICD-10-CM | POA: Diagnosis not present

## 2014-02-19 DIAGNOSIS — J449 Chronic obstructive pulmonary disease, unspecified: Secondary | ICD-10-CM | POA: Diagnosis not present

## 2014-02-19 DIAGNOSIS — E118 Type 2 diabetes mellitus with unspecified complications: Secondary | ICD-10-CM | POA: Diagnosis not present

## 2014-02-19 DIAGNOSIS — I509 Heart failure, unspecified: Secondary | ICD-10-CM | POA: Diagnosis not present

## 2014-02-26 ENCOUNTER — Encounter: Payer: Medicare Other | Admitting: Cardiology

## 2014-02-26 DIAGNOSIS — E039 Hypothyroidism, unspecified: Secondary | ICD-10-CM | POA: Diagnosis not present

## 2014-02-26 DIAGNOSIS — R531 Weakness: Secondary | ICD-10-CM | POA: Diagnosis not present

## 2014-02-26 DIAGNOSIS — I10 Essential (primary) hypertension: Secondary | ICD-10-CM | POA: Diagnosis not present

## 2014-02-26 DIAGNOSIS — R791 Abnormal coagulation profile: Secondary | ICD-10-CM | POA: Diagnosis not present

## 2014-02-26 DIAGNOSIS — E119 Type 2 diabetes mellitus without complications: Secondary | ICD-10-CM | POA: Diagnosis not present

## 2014-02-26 DIAGNOSIS — Z950 Presence of cardiac pacemaker: Secondary | ICD-10-CM | POA: Diagnosis not present

## 2014-02-26 DIAGNOSIS — R40242 Glasgow coma scale score 9-12: Secondary | ICD-10-CM | POA: Diagnosis not present

## 2014-02-26 DIAGNOSIS — R404 Transient alteration of awareness: Secondary | ICD-10-CM | POA: Diagnosis not present

## 2014-02-26 DIAGNOSIS — I4891 Unspecified atrial fibrillation: Secondary | ICD-10-CM | POA: Diagnosis not present

## 2014-02-26 DIAGNOSIS — N390 Urinary tract infection, site not specified: Secondary | ICD-10-CM | POA: Diagnosis not present

## 2014-02-26 DIAGNOSIS — J439 Emphysema, unspecified: Secondary | ICD-10-CM | POA: Diagnosis not present

## 2014-02-26 DIAGNOSIS — Z79899 Other long term (current) drug therapy: Secondary | ICD-10-CM | POA: Diagnosis not present

## 2014-02-26 DIAGNOSIS — J449 Chronic obstructive pulmonary disease, unspecified: Secondary | ICD-10-CM | POA: Diagnosis not present

## 2014-02-26 DIAGNOSIS — K219 Gastro-esophageal reflux disease without esophagitis: Secondary | ICD-10-CM | POA: Diagnosis not present

## 2014-02-26 DIAGNOSIS — Z7901 Long term (current) use of anticoagulants: Secondary | ICD-10-CM | POA: Diagnosis not present

## 2014-02-26 DIAGNOSIS — F0390 Unspecified dementia without behavioral disturbance: Secondary | ICD-10-CM | POA: Diagnosis not present

## 2014-02-26 DIAGNOSIS — E780 Pure hypercholesterolemia: Secondary | ICD-10-CM | POA: Diagnosis not present

## 2014-02-26 DIAGNOSIS — I509 Heart failure, unspecified: Secondary | ICD-10-CM | POA: Diagnosis not present

## 2014-02-26 DIAGNOSIS — Z794 Long term (current) use of insulin: Secondary | ICD-10-CM | POA: Diagnosis not present

## 2014-02-26 DIAGNOSIS — I2510 Atherosclerotic heart disease of native coronary artery without angina pectoris: Secondary | ICD-10-CM | POA: Diagnosis not present

## 2014-02-26 DIAGNOSIS — E118 Type 2 diabetes mellitus with unspecified complications: Secondary | ICD-10-CM | POA: Diagnosis not present

## 2014-02-26 NOTE — Progress Notes (Signed)
This encounter was created in error - please disregard.

## 2014-02-27 DIAGNOSIS — I509 Heart failure, unspecified: Secondary | ICD-10-CM | POA: Diagnosis not present

## 2014-02-27 DIAGNOSIS — J449 Chronic obstructive pulmonary disease, unspecified: Secondary | ICD-10-CM | POA: Diagnosis not present

## 2014-02-27 DIAGNOSIS — Z794 Long term (current) use of insulin: Secondary | ICD-10-CM | POA: Diagnosis not present

## 2014-02-27 DIAGNOSIS — N390 Urinary tract infection, site not specified: Secondary | ICD-10-CM | POA: Diagnosis not present

## 2014-02-27 DIAGNOSIS — E118 Type 2 diabetes mellitus with unspecified complications: Secondary | ICD-10-CM | POA: Diagnosis not present

## 2014-02-28 DIAGNOSIS — E118 Type 2 diabetes mellitus with unspecified complications: Secondary | ICD-10-CM | POA: Diagnosis not present

## 2014-02-28 DIAGNOSIS — N390 Urinary tract infection, site not specified: Secondary | ICD-10-CM | POA: Diagnosis not present

## 2014-02-28 DIAGNOSIS — Z794 Long term (current) use of insulin: Secondary | ICD-10-CM | POA: Diagnosis not present

## 2014-02-28 DIAGNOSIS — J449 Chronic obstructive pulmonary disease, unspecified: Secondary | ICD-10-CM | POA: Diagnosis not present

## 2014-02-28 DIAGNOSIS — I509 Heart failure, unspecified: Secondary | ICD-10-CM | POA: Diagnosis not present

## 2014-03-07 DIAGNOSIS — Z794 Long term (current) use of insulin: Secondary | ICD-10-CM | POA: Diagnosis not present

## 2014-03-07 DIAGNOSIS — J449 Chronic obstructive pulmonary disease, unspecified: Secondary | ICD-10-CM | POA: Diagnosis not present

## 2014-03-07 DIAGNOSIS — N390 Urinary tract infection, site not specified: Secondary | ICD-10-CM | POA: Diagnosis not present

## 2014-03-07 DIAGNOSIS — E118 Type 2 diabetes mellitus with unspecified complications: Secondary | ICD-10-CM | POA: Diagnosis not present

## 2014-03-07 DIAGNOSIS — I509 Heart failure, unspecified: Secondary | ICD-10-CM | POA: Diagnosis not present

## 2014-03-09 ENCOUNTER — Encounter: Payer: Self-pay | Admitting: Cardiology

## 2014-03-14 DIAGNOSIS — J449 Chronic obstructive pulmonary disease, unspecified: Secondary | ICD-10-CM | POA: Diagnosis not present

## 2014-03-14 DIAGNOSIS — I509 Heart failure, unspecified: Secondary | ICD-10-CM | POA: Diagnosis not present

## 2014-03-14 DIAGNOSIS — N390 Urinary tract infection, site not specified: Secondary | ICD-10-CM | POA: Diagnosis not present

## 2014-03-14 DIAGNOSIS — E118 Type 2 diabetes mellitus with unspecified complications: Secondary | ICD-10-CM | POA: Diagnosis not present

## 2014-03-14 DIAGNOSIS — Z794 Long term (current) use of insulin: Secondary | ICD-10-CM | POA: Diagnosis not present

## 2014-03-16 DIAGNOSIS — E118 Type 2 diabetes mellitus with unspecified complications: Secondary | ICD-10-CM | POA: Diagnosis not present

## 2014-03-16 DIAGNOSIS — J449 Chronic obstructive pulmonary disease, unspecified: Secondary | ICD-10-CM | POA: Diagnosis not present

## 2014-03-16 DIAGNOSIS — I509 Heart failure, unspecified: Secondary | ICD-10-CM | POA: Diagnosis not present

## 2014-03-16 DIAGNOSIS — N390 Urinary tract infection, site not specified: Secondary | ICD-10-CM | POA: Diagnosis not present

## 2014-03-16 DIAGNOSIS — Z794 Long term (current) use of insulin: Secondary | ICD-10-CM | POA: Diagnosis not present

## 2014-03-20 DIAGNOSIS — Z794 Long term (current) use of insulin: Secondary | ICD-10-CM | POA: Diagnosis not present

## 2014-03-20 DIAGNOSIS — I4891 Unspecified atrial fibrillation: Secondary | ICD-10-CM | POA: Diagnosis not present

## 2014-03-20 DIAGNOSIS — R0989 Other specified symptoms and signs involving the circulatory and respiratory systems: Secondary | ICD-10-CM | POA: Diagnosis not present

## 2014-03-20 DIAGNOSIS — E780 Pure hypercholesterolemia: Secondary | ICD-10-CM | POA: Diagnosis not present

## 2014-03-20 DIAGNOSIS — I10 Essential (primary) hypertension: Secondary | ICD-10-CM | POA: Diagnosis not present

## 2014-03-20 DIAGNOSIS — K5900 Constipation, unspecified: Secondary | ICD-10-CM | POA: Diagnosis not present

## 2014-03-20 DIAGNOSIS — E119 Type 2 diabetes mellitus without complications: Secondary | ICD-10-CM | POA: Diagnosis not present

## 2014-03-20 DIAGNOSIS — Z87891 Personal history of nicotine dependence: Secondary | ICD-10-CM | POA: Diagnosis not present

## 2014-03-20 DIAGNOSIS — J80 Acute respiratory distress syndrome: Secondary | ICD-10-CM | POA: Diagnosis not present

## 2014-03-20 DIAGNOSIS — R4182 Altered mental status, unspecified: Secondary | ICD-10-CM | POA: Diagnosis not present

## 2014-03-20 DIAGNOSIS — R9431 Abnormal electrocardiogram [ECG] [EKG]: Secondary | ICD-10-CM | POA: Diagnosis not present

## 2014-03-20 DIAGNOSIS — F0390 Unspecified dementia without behavioral disturbance: Secondary | ICD-10-CM | POA: Diagnosis not present

## 2014-03-20 DIAGNOSIS — I517 Cardiomegaly: Secondary | ICD-10-CM | POA: Diagnosis not present

## 2014-03-20 DIAGNOSIS — H04123 Dry eye syndrome of bilateral lacrimal glands: Secondary | ICD-10-CM | POA: Diagnosis not present

## 2014-03-20 DIAGNOSIS — E039 Hypothyroidism, unspecified: Secondary | ICD-10-CM | POA: Diagnosis not present

## 2014-03-20 DIAGNOSIS — R58 Hemorrhage, not elsewhere classified: Secondary | ICD-10-CM | POA: Diagnosis not present

## 2014-03-20 DIAGNOSIS — Z7901 Long term (current) use of anticoagulants: Secondary | ICD-10-CM | POA: Diagnosis not present

## 2014-03-21 ENCOUNTER — Encounter: Payer: Self-pay | Admitting: *Deleted

## 2014-03-21 DIAGNOSIS — K5900 Constipation, unspecified: Secondary | ICD-10-CM | POA: Diagnosis not present

## 2014-03-21 DIAGNOSIS — I2510 Atherosclerotic heart disease of native coronary artery without angina pectoris: Secondary | ICD-10-CM | POA: Diagnosis not present

## 2014-03-21 DIAGNOSIS — H04129 Dry eye syndrome of unspecified lacrimal gland: Secondary | ICD-10-CM | POA: Diagnosis not present

## 2014-03-21 DIAGNOSIS — I10 Essential (primary) hypertension: Secondary | ICD-10-CM | POA: Diagnosis not present

## 2014-03-21 DIAGNOSIS — Z794 Long term (current) use of insulin: Secondary | ICD-10-CM | POA: Diagnosis not present

## 2014-03-21 DIAGNOSIS — E039 Hypothyroidism, unspecified: Secondary | ICD-10-CM | POA: Diagnosis not present

## 2014-03-21 DIAGNOSIS — Z7901 Long term (current) use of anticoagulants: Secondary | ICD-10-CM | POA: Diagnosis not present

## 2014-03-21 DIAGNOSIS — H04123 Dry eye syndrome of bilateral lacrimal glands: Secondary | ICD-10-CM | POA: Diagnosis not present

## 2014-03-21 DIAGNOSIS — E780 Pure hypercholesterolemia: Secondary | ICD-10-CM | POA: Diagnosis not present

## 2014-03-21 DIAGNOSIS — Z87891 Personal history of nicotine dependence: Secondary | ICD-10-CM | POA: Diagnosis not present

## 2014-03-21 DIAGNOSIS — I509 Heart failure, unspecified: Secondary | ICD-10-CM | POA: Diagnosis not present

## 2014-03-21 DIAGNOSIS — I4891 Unspecified atrial fibrillation: Secondary | ICD-10-CM | POA: Diagnosis not present

## 2014-03-21 DIAGNOSIS — E119 Type 2 diabetes mellitus without complications: Secondary | ICD-10-CM | POA: Diagnosis not present

## 2014-03-22 DIAGNOSIS — I4891 Unspecified atrial fibrillation: Secondary | ICD-10-CM | POA: Diagnosis not present

## 2014-03-22 DIAGNOSIS — E780 Pure hypercholesterolemia: Secondary | ICD-10-CM | POA: Diagnosis not present

## 2014-03-22 DIAGNOSIS — F0390 Unspecified dementia without behavioral disturbance: Secondary | ICD-10-CM | POA: Diagnosis not present

## 2014-03-22 DIAGNOSIS — E039 Hypothyroidism, unspecified: Secondary | ICD-10-CM | POA: Diagnosis not present

## 2014-03-22 DIAGNOSIS — Z87891 Personal history of nicotine dependence: Secondary | ICD-10-CM | POA: Diagnosis not present

## 2014-03-22 DIAGNOSIS — I509 Heart failure, unspecified: Secondary | ICD-10-CM | POA: Diagnosis not present

## 2014-03-22 DIAGNOSIS — Z7952 Long term (current) use of systemic steroids: Secondary | ICD-10-CM | POA: Diagnosis not present

## 2014-03-22 DIAGNOSIS — I10 Essential (primary) hypertension: Secondary | ICD-10-CM | POA: Diagnosis not present

## 2014-03-22 DIAGNOSIS — I2510 Atherosclerotic heart disease of native coronary artery without angina pectoris: Secondary | ICD-10-CM | POA: Diagnosis not present

## 2014-03-22 DIAGNOSIS — K5900 Constipation, unspecified: Secondary | ICD-10-CM | POA: Diagnosis not present

## 2014-03-22 DIAGNOSIS — H538 Other visual disturbances: Secondary | ICD-10-CM | POA: Diagnosis not present

## 2014-03-22 DIAGNOSIS — E119 Type 2 diabetes mellitus without complications: Secondary | ICD-10-CM | POA: Diagnosis not present

## 2014-03-22 DIAGNOSIS — Z794 Long term (current) use of insulin: Secondary | ICD-10-CM | POA: Diagnosis not present

## 2014-03-23 DIAGNOSIS — J449 Chronic obstructive pulmonary disease, unspecified: Secondary | ICD-10-CM | POA: Diagnosis not present

## 2014-03-23 DIAGNOSIS — Z794 Long term (current) use of insulin: Secondary | ICD-10-CM | POA: Diagnosis not present

## 2014-03-23 DIAGNOSIS — N390 Urinary tract infection, site not specified: Secondary | ICD-10-CM | POA: Diagnosis not present

## 2014-03-23 DIAGNOSIS — I509 Heart failure, unspecified: Secondary | ICD-10-CM | POA: Diagnosis not present

## 2014-03-23 DIAGNOSIS — E118 Type 2 diabetes mellitus with unspecified complications: Secondary | ICD-10-CM | POA: Diagnosis not present

## 2014-03-24 DIAGNOSIS — Z7901 Long term (current) use of anticoagulants: Secondary | ICD-10-CM | POA: Diagnosis not present

## 2014-03-24 DIAGNOSIS — E119 Type 2 diabetes mellitus without complications: Secondary | ICD-10-CM | POA: Diagnosis not present

## 2014-03-24 DIAGNOSIS — R109 Unspecified abdominal pain: Secondary | ICD-10-CM | POA: Diagnosis not present

## 2014-03-24 DIAGNOSIS — K5900 Constipation, unspecified: Secondary | ICD-10-CM | POA: Diagnosis not present

## 2014-03-24 DIAGNOSIS — H538 Other visual disturbances: Secondary | ICD-10-CM | POA: Diagnosis not present

## 2014-03-24 DIAGNOSIS — M545 Low back pain: Secondary | ICD-10-CM | POA: Diagnosis not present

## 2014-03-24 DIAGNOSIS — K6289 Other specified diseases of anus and rectum: Secondary | ICD-10-CM | POA: Diagnosis not present

## 2014-03-24 DIAGNOSIS — I4891 Unspecified atrial fibrillation: Secondary | ICD-10-CM | POA: Diagnosis not present

## 2014-03-24 DIAGNOSIS — E780 Pure hypercholesterolemia: Secondary | ICD-10-CM | POA: Diagnosis not present

## 2014-03-24 DIAGNOSIS — Z87891 Personal history of nicotine dependence: Secondary | ICD-10-CM | POA: Diagnosis not present

## 2014-03-24 DIAGNOSIS — I10 Essential (primary) hypertension: Secondary | ICD-10-CM | POA: Diagnosis not present

## 2014-03-24 DIAGNOSIS — E039 Hypothyroidism, unspecified: Secondary | ICD-10-CM | POA: Diagnosis not present

## 2014-03-24 DIAGNOSIS — Z794 Long term (current) use of insulin: Secondary | ICD-10-CM | POA: Diagnosis not present

## 2014-03-27 DIAGNOSIS — R918 Other nonspecific abnormal finding of lung field: Secondary | ICD-10-CM | POA: Diagnosis not present

## 2014-03-27 DIAGNOSIS — I509 Heart failure, unspecified: Secondary | ICD-10-CM | POA: Diagnosis not present

## 2014-03-27 DIAGNOSIS — Z794 Long term (current) use of insulin: Secondary | ICD-10-CM | POA: Diagnosis not present

## 2014-03-27 DIAGNOSIS — Z90710 Acquired absence of both cervix and uterus: Secondary | ICD-10-CM | POA: Diagnosis not present

## 2014-03-27 DIAGNOSIS — E119 Type 2 diabetes mellitus without complications: Secondary | ICD-10-CM | POA: Diagnosis not present

## 2014-03-27 DIAGNOSIS — I10 Essential (primary) hypertension: Secondary | ICD-10-CM | POA: Diagnosis present

## 2014-03-27 DIAGNOSIS — Z87891 Personal history of nicotine dependence: Secondary | ICD-10-CM | POA: Diagnosis not present

## 2014-03-27 DIAGNOSIS — Z9981 Dependence on supplemental oxygen: Secondary | ICD-10-CM | POA: Diagnosis not present

## 2014-03-27 DIAGNOSIS — E785 Hyperlipidemia, unspecified: Secondary | ICD-10-CM | POA: Diagnosis present

## 2014-03-27 DIAGNOSIS — Z7901 Long term (current) use of anticoagulants: Secondary | ICD-10-CM | POA: Diagnosis not present

## 2014-03-27 DIAGNOSIS — Z951 Presence of aortocoronary bypass graft: Secondary | ICD-10-CM | POA: Diagnosis not present

## 2014-03-27 DIAGNOSIS — J449 Chronic obstructive pulmonary disease, unspecified: Secondary | ICD-10-CM | POA: Diagnosis not present

## 2014-03-27 DIAGNOSIS — Z79899 Other long term (current) drug therapy: Secondary | ICD-10-CM | POA: Diagnosis not present

## 2014-03-27 DIAGNOSIS — Z881 Allergy status to other antibiotic agents status: Secondary | ICD-10-CM | POA: Diagnosis not present

## 2014-03-27 DIAGNOSIS — F319 Bipolar disorder, unspecified: Secondary | ICD-10-CM | POA: Diagnosis present

## 2014-03-27 DIAGNOSIS — F23 Brief psychotic disorder: Secondary | ICD-10-CM | POA: Diagnosis not present

## 2014-03-27 DIAGNOSIS — Z882 Allergy status to sulfonamides status: Secondary | ICD-10-CM | POA: Diagnosis not present

## 2014-03-27 DIAGNOSIS — Z9109 Other allergy status, other than to drugs and biological substances: Secondary | ICD-10-CM | POA: Diagnosis not present

## 2014-03-27 DIAGNOSIS — R4182 Altered mental status, unspecified: Secondary | ICD-10-CM | POA: Diagnosis not present

## 2014-03-27 DIAGNOSIS — N390 Urinary tract infection, site not specified: Secondary | ICD-10-CM | POA: Diagnosis not present

## 2014-03-27 DIAGNOSIS — Z9114 Patient's other noncompliance with medication regimen: Secondary | ICD-10-CM | POA: Diagnosis present

## 2014-03-27 DIAGNOSIS — E039 Hypothyroidism, unspecified: Secondary | ICD-10-CM | POA: Diagnosis present

## 2014-03-27 DIAGNOSIS — Z7951 Long term (current) use of inhaled steroids: Secondary | ICD-10-CM | POA: Diagnosis not present

## 2014-03-27 DIAGNOSIS — J441 Chronic obstructive pulmonary disease with (acute) exacerbation: Secondary | ICD-10-CM | POA: Diagnosis not present

## 2014-03-27 DIAGNOSIS — J189 Pneumonia, unspecified organism: Secondary | ICD-10-CM | POA: Diagnosis not present

## 2014-03-27 DIAGNOSIS — Z950 Presence of cardiac pacemaker: Secondary | ICD-10-CM | POA: Diagnosis not present

## 2014-03-27 DIAGNOSIS — I2510 Atherosclerotic heart disease of native coronary artery without angina pectoris: Secondary | ICD-10-CM | POA: Diagnosis not present

## 2014-03-29 ENCOUNTER — Encounter: Payer: Self-pay | Admitting: Cardiology

## 2014-03-29 DIAGNOSIS — E11649 Type 2 diabetes mellitus with hypoglycemia without coma: Secondary | ICD-10-CM | POA: Diagnosis not present

## 2014-03-29 DIAGNOSIS — D649 Anemia, unspecified: Secondary | ICD-10-CM | POA: Diagnosis present

## 2014-03-29 DIAGNOSIS — I4891 Unspecified atrial fibrillation: Secondary | ICD-10-CM | POA: Diagnosis not present

## 2014-03-29 DIAGNOSIS — Z794 Long term (current) use of insulin: Secondary | ICD-10-CM | POA: Diagnosis not present

## 2014-03-29 DIAGNOSIS — J189 Pneumonia, unspecified organism: Secondary | ICD-10-CM | POA: Diagnosis not present

## 2014-03-29 DIAGNOSIS — I4581 Long QT syndrome: Secondary | ICD-10-CM | POA: Diagnosis present

## 2014-03-29 DIAGNOSIS — J449 Chronic obstructive pulmonary disease, unspecified: Secondary | ICD-10-CM | POA: Diagnosis not present

## 2014-03-29 DIAGNOSIS — Z882 Allergy status to sulfonamides status: Secondary | ICD-10-CM | POA: Diagnosis not present

## 2014-03-29 DIAGNOSIS — E119 Type 2 diabetes mellitus without complications: Secondary | ICD-10-CM | POA: Diagnosis not present

## 2014-03-29 DIAGNOSIS — E876 Hypokalemia: Secondary | ICD-10-CM | POA: Diagnosis not present

## 2014-03-29 DIAGNOSIS — Z9114 Patient's other noncompliance with medication regimen: Secondary | ICD-10-CM | POA: Diagnosis present

## 2014-03-29 DIAGNOSIS — Z9981 Dependence on supplemental oxygen: Secondary | ICD-10-CM | POA: Diagnosis not present

## 2014-03-29 DIAGNOSIS — Z880 Allergy status to penicillin: Secondary | ICD-10-CM | POA: Diagnosis not present

## 2014-03-29 DIAGNOSIS — E559 Vitamin D deficiency, unspecified: Secondary | ICD-10-CM | POA: Diagnosis present

## 2014-03-29 DIAGNOSIS — Z881 Allergy status to other antibiotic agents status: Secondary | ICD-10-CM | POA: Diagnosis not present

## 2014-03-29 DIAGNOSIS — Z950 Presence of cardiac pacemaker: Secondary | ICD-10-CM | POA: Diagnosis not present

## 2014-03-29 DIAGNOSIS — R0602 Shortness of breath: Secondary | ICD-10-CM | POA: Diagnosis not present

## 2014-03-29 DIAGNOSIS — J441 Chronic obstructive pulmonary disease with (acute) exacerbation: Secondary | ICD-10-CM | POA: Diagnosis present

## 2014-03-29 DIAGNOSIS — I10 Essential (primary) hypertension: Secondary | ICD-10-CM | POA: Diagnosis not present

## 2014-03-29 DIAGNOSIS — J9621 Acute and chronic respiratory failure with hypoxia: Secondary | ICD-10-CM | POA: Diagnosis not present

## 2014-03-29 DIAGNOSIS — F319 Bipolar disorder, unspecified: Secondary | ICD-10-CM | POA: Diagnosis not present

## 2014-03-29 DIAGNOSIS — K5900 Constipation, unspecified: Secondary | ICD-10-CM | POA: Diagnosis present

## 2014-03-29 DIAGNOSIS — Z87891 Personal history of nicotine dependence: Secondary | ICD-10-CM | POA: Diagnosis not present

## 2014-03-29 DIAGNOSIS — Z7901 Long term (current) use of anticoagulants: Secondary | ICD-10-CM | POA: Diagnosis not present

## 2014-03-29 DIAGNOSIS — R918 Other nonspecific abnormal finding of lung field: Secondary | ICD-10-CM | POA: Diagnosis not present

## 2014-03-29 DIAGNOSIS — F3110 Bipolar disorder, current episode manic without psychotic features, unspecified: Secondary | ICD-10-CM | POA: Diagnosis not present

## 2014-03-29 DIAGNOSIS — I2510 Atherosclerotic heart disease of native coronary artery without angina pectoris: Secondary | ICD-10-CM | POA: Diagnosis not present

## 2014-03-29 DIAGNOSIS — F29 Unspecified psychosis not due to a substance or known physiological condition: Secondary | ICD-10-CM | POA: Diagnosis not present

## 2014-03-29 DIAGNOSIS — I454 Nonspecific intraventricular block: Secondary | ICD-10-CM | POA: Diagnosis not present

## 2014-03-29 DIAGNOSIS — I517 Cardiomegaly: Secondary | ICD-10-CM | POA: Diagnosis not present

## 2014-03-29 DIAGNOSIS — E039 Hypothyroidism, unspecified: Secondary | ICD-10-CM | POA: Diagnosis not present

## 2014-03-29 DIAGNOSIS — Z888 Allergy status to other drugs, medicaments and biological substances status: Secondary | ICD-10-CM | POA: Diagnosis not present

## 2014-03-29 DIAGNOSIS — I509 Heart failure, unspecified: Secondary | ICD-10-CM | POA: Diagnosis present

## 2014-03-29 DIAGNOSIS — I5023 Acute on chronic systolic (congestive) heart failure: Secondary | ICD-10-CM | POA: Diagnosis not present

## 2014-03-29 DIAGNOSIS — E785 Hyperlipidemia, unspecified: Secondary | ICD-10-CM | POA: Diagnosis present

## 2014-03-29 DIAGNOSIS — Z951 Presence of aortocoronary bypass graft: Secondary | ICD-10-CM | POA: Diagnosis not present

## 2014-03-29 DIAGNOSIS — I519 Heart disease, unspecified: Secondary | ICD-10-CM | POA: Diagnosis not present

## 2014-03-29 DIAGNOSIS — I083 Combined rheumatic disorders of mitral, aortic and tricuspid valves: Secondary | ICD-10-CM | POA: Diagnosis not present

## 2014-04-09 DIAGNOSIS — I10 Essential (primary) hypertension: Secondary | ICD-10-CM | POA: Diagnosis present

## 2014-04-09 DIAGNOSIS — J189 Pneumonia, unspecified organism: Secondary | ICD-10-CM | POA: Diagnosis present

## 2014-04-09 DIAGNOSIS — R011 Cardiac murmur, unspecified: Secondary | ICD-10-CM | POA: Diagnosis present

## 2014-04-09 DIAGNOSIS — I519 Heart disease, unspecified: Secondary | ICD-10-CM | POA: Diagnosis not present

## 2014-04-09 DIAGNOSIS — Z950 Presence of cardiac pacemaker: Secondary | ICD-10-CM | POA: Diagnosis not present

## 2014-04-09 DIAGNOSIS — D1779 Benign lipomatous neoplasm of other sites: Secondary | ICD-10-CM | POA: Diagnosis not present

## 2014-04-09 DIAGNOSIS — I517 Cardiomegaly: Secondary | ICD-10-CM | POA: Diagnosis not present

## 2014-04-09 DIAGNOSIS — I2510 Atherosclerotic heart disease of native coronary artery without angina pectoris: Secondary | ICD-10-CM | POA: Diagnosis present

## 2014-04-09 DIAGNOSIS — Z880 Allergy status to penicillin: Secondary | ICD-10-CM | POA: Diagnosis not present

## 2014-04-09 DIAGNOSIS — J9601 Acute respiratory failure with hypoxia: Secondary | ICD-10-CM | POA: Diagnosis not present

## 2014-04-09 DIAGNOSIS — Z87440 Personal history of urinary (tract) infections: Secondary | ICD-10-CM | POA: Diagnosis not present

## 2014-04-09 DIAGNOSIS — J441 Chronic obstructive pulmonary disease with (acute) exacerbation: Secondary | ICD-10-CM | POA: Diagnosis not present

## 2014-04-09 DIAGNOSIS — R0602 Shortness of breath: Secondary | ICD-10-CM | POA: Diagnosis not present

## 2014-04-09 DIAGNOSIS — Z9981 Dependence on supplemental oxygen: Secondary | ICD-10-CM | POA: Diagnosis not present

## 2014-04-09 DIAGNOSIS — E039 Hypothyroidism, unspecified: Secondary | ICD-10-CM | POA: Diagnosis present

## 2014-04-09 DIAGNOSIS — D500 Iron deficiency anemia secondary to blood loss (chronic): Secondary | ICD-10-CM | POA: Diagnosis not present

## 2014-04-09 DIAGNOSIS — E11649 Type 2 diabetes mellitus with hypoglycemia without coma: Secondary | ICD-10-CM | POA: Diagnosis present

## 2014-04-09 DIAGNOSIS — D1739 Benign lipomatous neoplasm of skin and subcutaneous tissue of other sites: Secondary | ICD-10-CM | POA: Diagnosis present

## 2014-04-09 DIAGNOSIS — R0989 Other specified symptoms and signs involving the circulatory and respiratory systems: Secondary | ICD-10-CM | POA: Diagnosis not present

## 2014-04-09 DIAGNOSIS — J9600 Acute respiratory failure, unspecified whether with hypoxia or hypercapnia: Secondary | ICD-10-CM | POA: Diagnosis not present

## 2014-04-09 DIAGNOSIS — F310 Bipolar disorder, current episode hypomanic: Secondary | ICD-10-CM | POA: Diagnosis not present

## 2014-04-09 DIAGNOSIS — I4581 Long QT syndrome: Secondary | ICD-10-CM | POA: Diagnosis not present

## 2014-04-09 DIAGNOSIS — K5900 Constipation, unspecified: Secondary | ICD-10-CM | POA: Diagnosis present

## 2014-04-09 DIAGNOSIS — J9621 Acute and chronic respiratory failure with hypoxia: Secondary | ICD-10-CM | POA: Diagnosis present

## 2014-04-09 DIAGNOSIS — I4891 Unspecified atrial fibrillation: Secondary | ICD-10-CM | POA: Diagnosis present

## 2014-04-09 DIAGNOSIS — Z881 Allergy status to other antibiotic agents status: Secondary | ICD-10-CM | POA: Diagnosis not present

## 2014-04-09 DIAGNOSIS — Z794 Long term (current) use of insulin: Secondary | ICD-10-CM | POA: Diagnosis not present

## 2014-04-09 DIAGNOSIS — K6289 Other specified diseases of anus and rectum: Secondary | ICD-10-CM | POA: Diagnosis not present

## 2014-04-09 DIAGNOSIS — D649 Anemia, unspecified: Secondary | ICD-10-CM | POA: Diagnosis not present

## 2014-04-09 DIAGNOSIS — E785 Hyperlipidemia, unspecified: Secondary | ICD-10-CM | POA: Diagnosis present

## 2014-04-09 DIAGNOSIS — I509 Heart failure, unspecified: Secondary | ICD-10-CM | POA: Diagnosis present

## 2014-04-09 DIAGNOSIS — I482 Chronic atrial fibrillation: Secondary | ICD-10-CM | POA: Diagnosis not present

## 2014-04-09 DIAGNOSIS — Z7901 Long term (current) use of anticoagulants: Secondary | ICD-10-CM | POA: Diagnosis not present

## 2014-04-09 DIAGNOSIS — E876 Hypokalemia: Secondary | ICD-10-CM | POA: Diagnosis not present

## 2014-04-09 DIAGNOSIS — F3110 Bipolar disorder, current episode manic without psychotic features, unspecified: Secondary | ICD-10-CM | POA: Diagnosis present

## 2014-04-09 DIAGNOSIS — E119 Type 2 diabetes mellitus without complications: Secondary | ICD-10-CM | POA: Diagnosis not present

## 2014-04-13 DIAGNOSIS — J449 Chronic obstructive pulmonary disease, unspecified: Secondary | ICD-10-CM | POA: Diagnosis not present

## 2014-04-13 DIAGNOSIS — J189 Pneumonia, unspecified organism: Secondary | ICD-10-CM | POA: Diagnosis not present

## 2014-04-13 DIAGNOSIS — Z7901 Long term (current) use of anticoagulants: Secondary | ICD-10-CM | POA: Diagnosis not present

## 2014-04-13 DIAGNOSIS — E785 Hyperlipidemia, unspecified: Secondary | ICD-10-CM | POA: Diagnosis not present

## 2014-04-13 DIAGNOSIS — D500 Iron deficiency anemia secondary to blood loss (chronic): Secondary | ICD-10-CM | POA: Diagnosis not present

## 2014-04-13 DIAGNOSIS — N390 Urinary tract infection, site not specified: Secondary | ICD-10-CM | POA: Diagnosis not present

## 2014-04-13 DIAGNOSIS — F310 Bipolar disorder, current episode hypomanic: Secondary | ICD-10-CM | POA: Diagnosis not present

## 2014-04-13 DIAGNOSIS — I482 Chronic atrial fibrillation: Secondary | ICD-10-CM | POA: Diagnosis not present

## 2014-04-13 DIAGNOSIS — J9601 Acute respiratory failure with hypoxia: Secondary | ICD-10-CM | POA: Diagnosis not present

## 2014-04-13 DIAGNOSIS — E119 Type 2 diabetes mellitus without complications: Secondary | ICD-10-CM | POA: Diagnosis not present

## 2014-04-13 DIAGNOSIS — J9600 Acute respiratory failure, unspecified whether with hypoxia or hypercapnia: Secondary | ICD-10-CM | POA: Diagnosis not present

## 2014-04-13 DIAGNOSIS — I10 Essential (primary) hypertension: Secondary | ICD-10-CM | POA: Diagnosis not present

## 2014-04-13 DIAGNOSIS — R0602 Shortness of breath: Secondary | ICD-10-CM | POA: Diagnosis not present

## 2014-04-13 DIAGNOSIS — J441 Chronic obstructive pulmonary disease with (acute) exacerbation: Secondary | ICD-10-CM | POA: Diagnosis not present

## 2014-04-13 DIAGNOSIS — I519 Heart disease, unspecified: Secondary | ICD-10-CM | POA: Diagnosis not present

## 2014-04-13 DIAGNOSIS — D1779 Benign lipomatous neoplasm of other sites: Secondary | ICD-10-CM | POA: Diagnosis not present

## 2014-04-13 DIAGNOSIS — E039 Hypothyroidism, unspecified: Secondary | ICD-10-CM | POA: Diagnosis not present

## 2014-04-24 DIAGNOSIS — J189 Pneumonia, unspecified organism: Secondary | ICD-10-CM | POA: Diagnosis not present

## 2014-04-24 DIAGNOSIS — E119 Type 2 diabetes mellitus without complications: Secondary | ICD-10-CM | POA: Diagnosis not present

## 2014-04-24 DIAGNOSIS — J449 Chronic obstructive pulmonary disease, unspecified: Secondary | ICD-10-CM | POA: Diagnosis not present

## 2014-04-24 DIAGNOSIS — N390 Urinary tract infection, site not specified: Secondary | ICD-10-CM | POA: Diagnosis not present

## 2014-04-26 DIAGNOSIS — E1140 Type 2 diabetes mellitus with diabetic neuropathy, unspecified: Secondary | ICD-10-CM | POA: Diagnosis not present

## 2014-04-26 DIAGNOSIS — Z794 Long term (current) use of insulin: Secondary | ICD-10-CM | POA: Diagnosis not present

## 2014-04-26 DIAGNOSIS — F319 Bipolar disorder, unspecified: Secondary | ICD-10-CM | POA: Diagnosis not present

## 2014-04-26 DIAGNOSIS — Z8701 Personal history of pneumonia (recurrent): Secondary | ICD-10-CM | POA: Diagnosis not present

## 2014-04-26 DIAGNOSIS — J441 Chronic obstructive pulmonary disease with (acute) exacerbation: Secondary | ICD-10-CM | POA: Diagnosis not present

## 2014-04-26 DIAGNOSIS — I10 Essential (primary) hypertension: Secondary | ICD-10-CM | POA: Diagnosis not present

## 2014-04-29 DIAGNOSIS — J441 Chronic obstructive pulmonary disease with (acute) exacerbation: Secondary | ICD-10-CM | POA: Diagnosis not present

## 2014-04-29 DIAGNOSIS — I10 Essential (primary) hypertension: Secondary | ICD-10-CM | POA: Diagnosis not present

## 2014-04-29 DIAGNOSIS — Z794 Long term (current) use of insulin: Secondary | ICD-10-CM | POA: Diagnosis not present

## 2014-04-29 DIAGNOSIS — Z8701 Personal history of pneumonia (recurrent): Secondary | ICD-10-CM | POA: Diagnosis not present

## 2014-04-29 DIAGNOSIS — F319 Bipolar disorder, unspecified: Secondary | ICD-10-CM | POA: Diagnosis not present

## 2014-04-29 DIAGNOSIS — E1140 Type 2 diabetes mellitus with diabetic neuropathy, unspecified: Secondary | ICD-10-CM | POA: Diagnosis not present

## 2014-04-30 DIAGNOSIS — I10 Essential (primary) hypertension: Secondary | ICD-10-CM | POA: Diagnosis not present

## 2014-04-30 DIAGNOSIS — Z794 Long term (current) use of insulin: Secondary | ICD-10-CM | POA: Diagnosis not present

## 2014-04-30 DIAGNOSIS — E1140 Type 2 diabetes mellitus with diabetic neuropathy, unspecified: Secondary | ICD-10-CM | POA: Diagnosis not present

## 2014-04-30 DIAGNOSIS — F319 Bipolar disorder, unspecified: Secondary | ICD-10-CM | POA: Diagnosis not present

## 2014-04-30 DIAGNOSIS — J441 Chronic obstructive pulmonary disease with (acute) exacerbation: Secondary | ICD-10-CM | POA: Diagnosis not present

## 2014-04-30 DIAGNOSIS — Z8701 Personal history of pneumonia (recurrent): Secondary | ICD-10-CM | POA: Diagnosis not present

## 2014-05-01 DIAGNOSIS — I10 Essential (primary) hypertension: Secondary | ICD-10-CM | POA: Diagnosis not present

## 2014-05-01 DIAGNOSIS — Z8701 Personal history of pneumonia (recurrent): Secondary | ICD-10-CM | POA: Diagnosis not present

## 2014-05-01 DIAGNOSIS — E1140 Type 2 diabetes mellitus with diabetic neuropathy, unspecified: Secondary | ICD-10-CM | POA: Diagnosis not present

## 2014-05-01 DIAGNOSIS — J441 Chronic obstructive pulmonary disease with (acute) exacerbation: Secondary | ICD-10-CM | POA: Diagnosis not present

## 2014-05-01 DIAGNOSIS — Z794 Long term (current) use of insulin: Secondary | ICD-10-CM | POA: Diagnosis not present

## 2014-05-01 DIAGNOSIS — F319 Bipolar disorder, unspecified: Secondary | ICD-10-CM | POA: Diagnosis not present

## 2014-05-03 DIAGNOSIS — Z8701 Personal history of pneumonia (recurrent): Secondary | ICD-10-CM | POA: Diagnosis not present

## 2014-05-03 DIAGNOSIS — J441 Chronic obstructive pulmonary disease with (acute) exacerbation: Secondary | ICD-10-CM | POA: Diagnosis not present

## 2014-05-03 DIAGNOSIS — Z794 Long term (current) use of insulin: Secondary | ICD-10-CM | POA: Diagnosis not present

## 2014-05-03 DIAGNOSIS — E1140 Type 2 diabetes mellitus with diabetic neuropathy, unspecified: Secondary | ICD-10-CM | POA: Diagnosis not present

## 2014-05-03 DIAGNOSIS — I10 Essential (primary) hypertension: Secondary | ICD-10-CM | POA: Diagnosis not present

## 2014-05-03 DIAGNOSIS — F319 Bipolar disorder, unspecified: Secondary | ICD-10-CM | POA: Diagnosis not present

## 2014-05-04 DIAGNOSIS — I10 Essential (primary) hypertension: Secondary | ICD-10-CM | POA: Diagnosis not present

## 2014-05-04 DIAGNOSIS — E1140 Type 2 diabetes mellitus with diabetic neuropathy, unspecified: Secondary | ICD-10-CM | POA: Diagnosis not present

## 2014-05-04 DIAGNOSIS — Z794 Long term (current) use of insulin: Secondary | ICD-10-CM | POA: Diagnosis not present

## 2014-05-04 DIAGNOSIS — F319 Bipolar disorder, unspecified: Secondary | ICD-10-CM | POA: Diagnosis not present

## 2014-05-04 DIAGNOSIS — Z8701 Personal history of pneumonia (recurrent): Secondary | ICD-10-CM | POA: Diagnosis not present

## 2014-05-04 DIAGNOSIS — J441 Chronic obstructive pulmonary disease with (acute) exacerbation: Secondary | ICD-10-CM | POA: Diagnosis not present

## 2014-05-08 DIAGNOSIS — Z794 Long term (current) use of insulin: Secondary | ICD-10-CM | POA: Diagnosis not present

## 2014-05-08 DIAGNOSIS — F319 Bipolar disorder, unspecified: Secondary | ICD-10-CM | POA: Diagnosis not present

## 2014-05-08 DIAGNOSIS — I10 Essential (primary) hypertension: Secondary | ICD-10-CM | POA: Diagnosis not present

## 2014-05-08 DIAGNOSIS — Z8701 Personal history of pneumonia (recurrent): Secondary | ICD-10-CM | POA: Diagnosis not present

## 2014-05-08 DIAGNOSIS — E1140 Type 2 diabetes mellitus with diabetic neuropathy, unspecified: Secondary | ICD-10-CM | POA: Diagnosis not present

## 2014-05-08 DIAGNOSIS — J441 Chronic obstructive pulmonary disease with (acute) exacerbation: Secondary | ICD-10-CM | POA: Diagnosis not present

## 2014-05-10 DIAGNOSIS — J441 Chronic obstructive pulmonary disease with (acute) exacerbation: Secondary | ICD-10-CM | POA: Diagnosis not present

## 2014-05-10 DIAGNOSIS — Z8701 Personal history of pneumonia (recurrent): Secondary | ICD-10-CM | POA: Diagnosis not present

## 2014-05-10 DIAGNOSIS — Z794 Long term (current) use of insulin: Secondary | ICD-10-CM | POA: Diagnosis not present

## 2014-05-10 DIAGNOSIS — I10 Essential (primary) hypertension: Secondary | ICD-10-CM | POA: Diagnosis not present

## 2014-05-10 DIAGNOSIS — E1140 Type 2 diabetes mellitus with diabetic neuropathy, unspecified: Secondary | ICD-10-CM | POA: Diagnosis not present

## 2014-05-10 DIAGNOSIS — F319 Bipolar disorder, unspecified: Secondary | ICD-10-CM | POA: Diagnosis not present

## 2014-05-11 DIAGNOSIS — F319 Bipolar disorder, unspecified: Secondary | ICD-10-CM | POA: Diagnosis not present

## 2014-05-11 DIAGNOSIS — E1140 Type 2 diabetes mellitus with diabetic neuropathy, unspecified: Secondary | ICD-10-CM | POA: Diagnosis not present

## 2014-05-11 DIAGNOSIS — J441 Chronic obstructive pulmonary disease with (acute) exacerbation: Secondary | ICD-10-CM | POA: Diagnosis not present

## 2014-05-11 DIAGNOSIS — Z8701 Personal history of pneumonia (recurrent): Secondary | ICD-10-CM | POA: Diagnosis not present

## 2014-05-11 DIAGNOSIS — Z794 Long term (current) use of insulin: Secondary | ICD-10-CM | POA: Diagnosis not present

## 2014-05-11 DIAGNOSIS — I10 Essential (primary) hypertension: Secondary | ICD-10-CM | POA: Diagnosis not present

## 2014-05-15 DIAGNOSIS — Z794 Long term (current) use of insulin: Secondary | ICD-10-CM | POA: Diagnosis not present

## 2014-05-15 DIAGNOSIS — E1140 Type 2 diabetes mellitus with diabetic neuropathy, unspecified: Secondary | ICD-10-CM | POA: Diagnosis not present

## 2014-05-15 DIAGNOSIS — I10 Essential (primary) hypertension: Secondary | ICD-10-CM | POA: Diagnosis not present

## 2014-05-15 DIAGNOSIS — J441 Chronic obstructive pulmonary disease with (acute) exacerbation: Secondary | ICD-10-CM | POA: Diagnosis not present

## 2014-05-15 DIAGNOSIS — F319 Bipolar disorder, unspecified: Secondary | ICD-10-CM | POA: Diagnosis not present

## 2014-05-15 DIAGNOSIS — Z8701 Personal history of pneumonia (recurrent): Secondary | ICD-10-CM | POA: Diagnosis not present

## 2014-05-17 DIAGNOSIS — F319 Bipolar disorder, unspecified: Secondary | ICD-10-CM | POA: Diagnosis not present

## 2014-05-17 DIAGNOSIS — I10 Essential (primary) hypertension: Secondary | ICD-10-CM | POA: Diagnosis not present

## 2014-05-17 DIAGNOSIS — Z8701 Personal history of pneumonia (recurrent): Secondary | ICD-10-CM | POA: Diagnosis not present

## 2014-05-17 DIAGNOSIS — Z794 Long term (current) use of insulin: Secondary | ICD-10-CM | POA: Diagnosis not present

## 2014-05-17 DIAGNOSIS — J441 Chronic obstructive pulmonary disease with (acute) exacerbation: Secondary | ICD-10-CM | POA: Diagnosis not present

## 2014-05-17 DIAGNOSIS — E1140 Type 2 diabetes mellitus with diabetic neuropathy, unspecified: Secondary | ICD-10-CM | POA: Diagnosis not present

## 2014-05-22 DIAGNOSIS — Z8701 Personal history of pneumonia (recurrent): Secondary | ICD-10-CM | POA: Diagnosis not present

## 2014-05-22 DIAGNOSIS — E1140 Type 2 diabetes mellitus with diabetic neuropathy, unspecified: Secondary | ICD-10-CM | POA: Diagnosis not present

## 2014-05-22 DIAGNOSIS — Z794 Long term (current) use of insulin: Secondary | ICD-10-CM | POA: Diagnosis not present

## 2014-05-22 DIAGNOSIS — J441 Chronic obstructive pulmonary disease with (acute) exacerbation: Secondary | ICD-10-CM | POA: Diagnosis not present

## 2014-05-22 DIAGNOSIS — F319 Bipolar disorder, unspecified: Secondary | ICD-10-CM | POA: Diagnosis not present

## 2014-05-22 DIAGNOSIS — I10 Essential (primary) hypertension: Secondary | ICD-10-CM | POA: Diagnosis not present

## 2014-05-23 DIAGNOSIS — F319 Bipolar disorder, unspecified: Secondary | ICD-10-CM | POA: Diagnosis not present

## 2014-05-23 DIAGNOSIS — Z794 Long term (current) use of insulin: Secondary | ICD-10-CM | POA: Diagnosis not present

## 2014-05-23 DIAGNOSIS — E1140 Type 2 diabetes mellitus with diabetic neuropathy, unspecified: Secondary | ICD-10-CM | POA: Diagnosis not present

## 2014-05-23 DIAGNOSIS — Z8701 Personal history of pneumonia (recurrent): Secondary | ICD-10-CM | POA: Diagnosis not present

## 2014-05-23 DIAGNOSIS — J441 Chronic obstructive pulmonary disease with (acute) exacerbation: Secondary | ICD-10-CM | POA: Diagnosis not present

## 2014-05-23 DIAGNOSIS — I10 Essential (primary) hypertension: Secondary | ICD-10-CM | POA: Diagnosis not present

## 2014-05-25 DIAGNOSIS — Z794 Long term (current) use of insulin: Secondary | ICD-10-CM | POA: Diagnosis not present

## 2014-05-25 DIAGNOSIS — F319 Bipolar disorder, unspecified: Secondary | ICD-10-CM | POA: Diagnosis not present

## 2014-05-25 DIAGNOSIS — I10 Essential (primary) hypertension: Secondary | ICD-10-CM | POA: Diagnosis not present

## 2014-05-25 DIAGNOSIS — Z8701 Personal history of pneumonia (recurrent): Secondary | ICD-10-CM | POA: Diagnosis not present

## 2014-05-25 DIAGNOSIS — E1140 Type 2 diabetes mellitus with diabetic neuropathy, unspecified: Secondary | ICD-10-CM | POA: Diagnosis not present

## 2014-05-25 DIAGNOSIS — J441 Chronic obstructive pulmonary disease with (acute) exacerbation: Secondary | ICD-10-CM | POA: Diagnosis not present

## 2014-05-28 DIAGNOSIS — R627 Adult failure to thrive: Secondary | ICD-10-CM | POA: Diagnosis not present

## 2014-05-28 DIAGNOSIS — J449 Chronic obstructive pulmonary disease, unspecified: Secondary | ICD-10-CM | POA: Diagnosis not present

## 2014-05-28 DIAGNOSIS — Z09 Encounter for follow-up examination after completed treatment for conditions other than malignant neoplasm: Secondary | ICD-10-CM | POA: Diagnosis not present

## 2014-05-28 DIAGNOSIS — Z79899 Other long term (current) drug therapy: Secondary | ICD-10-CM | POA: Diagnosis not present

## 2014-05-28 DIAGNOSIS — F0390 Unspecified dementia without behavioral disturbance: Secondary | ICD-10-CM | POA: Diagnosis not present

## 2014-05-28 DIAGNOSIS — F319 Bipolar disorder, unspecified: Secondary | ICD-10-CM | POA: Diagnosis not present

## 2014-05-30 DIAGNOSIS — J441 Chronic obstructive pulmonary disease with (acute) exacerbation: Secondary | ICD-10-CM | POA: Diagnosis not present

## 2014-05-30 DIAGNOSIS — Z8701 Personal history of pneumonia (recurrent): Secondary | ICD-10-CM | POA: Diagnosis not present

## 2014-05-30 DIAGNOSIS — Z794 Long term (current) use of insulin: Secondary | ICD-10-CM | POA: Diagnosis not present

## 2014-05-30 DIAGNOSIS — F319 Bipolar disorder, unspecified: Secondary | ICD-10-CM | POA: Diagnosis not present

## 2014-05-30 DIAGNOSIS — I10 Essential (primary) hypertension: Secondary | ICD-10-CM | POA: Diagnosis not present

## 2014-05-30 DIAGNOSIS — E1140 Type 2 diabetes mellitus with diabetic neuropathy, unspecified: Secondary | ICD-10-CM | POA: Diagnosis not present

## 2014-05-30 DIAGNOSIS — Z5181 Encounter for therapeutic drug level monitoring: Secondary | ICD-10-CM | POA: Diagnosis not present

## 2014-06-05 DIAGNOSIS — E1140 Type 2 diabetes mellitus with diabetic neuropathy, unspecified: Secondary | ICD-10-CM | POA: Diagnosis not present

## 2014-06-05 DIAGNOSIS — F319 Bipolar disorder, unspecified: Secondary | ICD-10-CM | POA: Diagnosis not present

## 2014-06-05 DIAGNOSIS — Z8701 Personal history of pneumonia (recurrent): Secondary | ICD-10-CM | POA: Diagnosis not present

## 2014-06-05 DIAGNOSIS — I10 Essential (primary) hypertension: Secondary | ICD-10-CM | POA: Diagnosis not present

## 2014-06-05 DIAGNOSIS — Z794 Long term (current) use of insulin: Secondary | ICD-10-CM | POA: Diagnosis not present

## 2014-06-05 DIAGNOSIS — J441 Chronic obstructive pulmonary disease with (acute) exacerbation: Secondary | ICD-10-CM | POA: Diagnosis not present

## 2014-06-13 DIAGNOSIS — E1140 Type 2 diabetes mellitus with diabetic neuropathy, unspecified: Secondary | ICD-10-CM | POA: Diagnosis not present

## 2014-06-13 DIAGNOSIS — F319 Bipolar disorder, unspecified: Secondary | ICD-10-CM | POA: Diagnosis not present

## 2014-06-13 DIAGNOSIS — Z8701 Personal history of pneumonia (recurrent): Secondary | ICD-10-CM | POA: Diagnosis not present

## 2014-06-13 DIAGNOSIS — J441 Chronic obstructive pulmonary disease with (acute) exacerbation: Secondary | ICD-10-CM | POA: Diagnosis not present

## 2014-06-13 DIAGNOSIS — Z794 Long term (current) use of insulin: Secondary | ICD-10-CM | POA: Diagnosis not present

## 2014-06-13 DIAGNOSIS — I10 Essential (primary) hypertension: Secondary | ICD-10-CM | POA: Diagnosis not present

## 2014-06-22 DIAGNOSIS — F319 Bipolar disorder, unspecified: Secondary | ICD-10-CM | POA: Diagnosis not present

## 2014-06-22 DIAGNOSIS — I10 Essential (primary) hypertension: Secondary | ICD-10-CM | POA: Diagnosis not present

## 2014-06-22 DIAGNOSIS — J441 Chronic obstructive pulmonary disease with (acute) exacerbation: Secondary | ICD-10-CM | POA: Diagnosis not present

## 2014-06-22 DIAGNOSIS — E1140 Type 2 diabetes mellitus with diabetic neuropathy, unspecified: Secondary | ICD-10-CM | POA: Diagnosis not present

## 2014-06-22 DIAGNOSIS — Z794 Long term (current) use of insulin: Secondary | ICD-10-CM | POA: Diagnosis not present

## 2014-06-22 DIAGNOSIS — Z8701 Personal history of pneumonia (recurrent): Secondary | ICD-10-CM | POA: Diagnosis not present

## 2014-08-07 DIAGNOSIS — S7291XA Unspecified fracture of right femur, initial encounter for closed fracture: Secondary | ICD-10-CM | POA: Diagnosis not present

## 2014-08-07 DIAGNOSIS — S72331A Displaced oblique fracture of shaft of right femur, initial encounter for closed fracture: Secondary | ICD-10-CM | POA: Diagnosis not present

## 2014-08-07 DIAGNOSIS — Z01811 Encounter for preprocedural respiratory examination: Secondary | ICD-10-CM | POA: Diagnosis not present

## 2014-08-07 DIAGNOSIS — J449 Chronic obstructive pulmonary disease, unspecified: Secondary | ICD-10-CM | POA: Diagnosis not present

## 2014-08-07 DIAGNOSIS — R069 Unspecified abnormalities of breathing: Secondary | ICD-10-CM | POA: Diagnosis not present

## 2014-08-07 DIAGNOSIS — N390 Urinary tract infection, site not specified: Secondary | ICD-10-CM | POA: Diagnosis not present

## 2014-08-08 DIAGNOSIS — F0390 Unspecified dementia without behavioral disturbance: Secondary | ICD-10-CM | POA: Diagnosis not present

## 2014-08-08 DIAGNOSIS — J9811 Atelectasis: Secondary | ICD-10-CM | POA: Diagnosis not present

## 2014-08-08 DIAGNOSIS — M6281 Muscle weakness (generalized): Secondary | ICD-10-CM | POA: Diagnosis not present

## 2014-08-08 DIAGNOSIS — I255 Ischemic cardiomyopathy: Secondary | ICD-10-CM | POA: Diagnosis not present

## 2014-08-08 DIAGNOSIS — I272 Other secondary pulmonary hypertension: Secondary | ICD-10-CM | POA: Diagnosis not present

## 2014-08-08 DIAGNOSIS — I509 Heart failure, unspecified: Secondary | ICD-10-CM | POA: Diagnosis not present

## 2014-08-08 DIAGNOSIS — S72001A Fracture of unspecified part of neck of right femur, initial encounter for closed fracture: Secondary | ICD-10-CM | POA: Diagnosis not present

## 2014-08-08 DIAGNOSIS — K219 Gastro-esophageal reflux disease without esophagitis: Secondary | ICD-10-CM | POA: Diagnosis present

## 2014-08-08 DIAGNOSIS — E119 Type 2 diabetes mellitus without complications: Secondary | ICD-10-CM | POA: Diagnosis present

## 2014-08-08 DIAGNOSIS — I2510 Atherosclerotic heart disease of native coronary artery without angina pectoris: Secondary | ICD-10-CM | POA: Diagnosis present

## 2014-08-08 DIAGNOSIS — D62 Acute posthemorrhagic anemia: Secondary | ICD-10-CM | POA: Diagnosis present

## 2014-08-08 DIAGNOSIS — E780 Pure hypercholesterolemia: Secondary | ICD-10-CM | POA: Diagnosis present

## 2014-08-08 DIAGNOSIS — I361 Nonrheumatic tricuspid (valve) insufficiency: Secondary | ICD-10-CM | POA: Diagnosis not present

## 2014-08-08 DIAGNOSIS — J439 Emphysema, unspecified: Secondary | ICD-10-CM | POA: Diagnosis present

## 2014-08-08 DIAGNOSIS — R279 Unspecified lack of coordination: Secondary | ICD-10-CM | POA: Diagnosis not present

## 2014-08-08 DIAGNOSIS — T17800A Unspecified foreign body in other parts of respiratory tract causing asphyxiation, initial encounter: Secondary | ICD-10-CM | POA: Diagnosis not present

## 2014-08-08 DIAGNOSIS — I517 Cardiomegaly: Secondary | ICD-10-CM | POA: Diagnosis not present

## 2014-08-08 DIAGNOSIS — S72301A Unspecified fracture of shaft of right femur, initial encounter for closed fracture: Secondary | ICD-10-CM | POA: Diagnosis not present

## 2014-08-08 DIAGNOSIS — I952 Hypotension due to drugs: Secondary | ICD-10-CM | POA: Diagnosis present

## 2014-08-08 DIAGNOSIS — R918 Other nonspecific abnormal finding of lung field: Secondary | ICD-10-CM | POA: Diagnosis not present

## 2014-08-08 DIAGNOSIS — N3000 Acute cystitis without hematuria: Secondary | ICD-10-CM | POA: Diagnosis not present

## 2014-08-08 DIAGNOSIS — M25551 Pain in right hip: Secondary | ICD-10-CM | POA: Diagnosis not present

## 2014-08-08 DIAGNOSIS — S72331A Displaced oblique fracture of shaft of right femur, initial encounter for closed fracture: Secondary | ICD-10-CM | POA: Diagnosis not present

## 2014-08-08 DIAGNOSIS — Z950 Presence of cardiac pacemaker: Secondary | ICD-10-CM | POA: Diagnosis not present

## 2014-08-08 DIAGNOSIS — Z951 Presence of aortocoronary bypass graft: Secondary | ICD-10-CM | POA: Diagnosis not present

## 2014-08-08 DIAGNOSIS — I959 Hypotension, unspecified: Secondary | ICD-10-CM | POA: Diagnosis not present

## 2014-08-08 DIAGNOSIS — I10 Essential (primary) hypertension: Secondary | ICD-10-CM | POA: Diagnosis present

## 2014-08-08 DIAGNOSIS — D649 Anemia, unspecified: Secondary | ICD-10-CM | POA: Diagnosis not present

## 2014-08-08 DIAGNOSIS — E039 Hypothyroidism, unspecified: Secondary | ICD-10-CM | POA: Diagnosis present

## 2014-08-08 DIAGNOSIS — J90 Pleural effusion, not elsewhere classified: Secondary | ICD-10-CM | POA: Diagnosis not present

## 2014-08-08 DIAGNOSIS — Z7901 Long term (current) use of anticoagulants: Secondary | ICD-10-CM | POA: Diagnosis not present

## 2014-08-08 DIAGNOSIS — M79604 Pain in right leg: Secondary | ICD-10-CM | POA: Diagnosis not present

## 2014-08-08 DIAGNOSIS — S72301D Unspecified fracture of shaft of right femur, subsequent encounter for closed fracture with routine healing: Secondary | ICD-10-CM | POA: Diagnosis not present

## 2014-08-08 DIAGNOSIS — S72401A Unspecified fracture of lower end of right femur, initial encounter for closed fracture: Secondary | ICD-10-CM | POA: Diagnosis not present

## 2014-08-08 DIAGNOSIS — I739 Peripheral vascular disease, unspecified: Secondary | ICD-10-CM | POA: Diagnosis present

## 2014-08-08 DIAGNOSIS — J9610 Chronic respiratory failure, unspecified whether with hypoxia or hypercapnia: Secondary | ICD-10-CM | POA: Diagnosis not present

## 2014-08-08 DIAGNOSIS — J449 Chronic obstructive pulmonary disease, unspecified: Secondary | ICD-10-CM | POA: Diagnosis not present

## 2014-08-08 DIAGNOSIS — I5022 Chronic systolic (congestive) heart failure: Secondary | ICD-10-CM | POA: Diagnosis not present

## 2014-08-08 DIAGNOSIS — S299XXA Unspecified injury of thorax, initial encounter: Secondary | ICD-10-CM | POA: Diagnosis not present

## 2014-08-08 DIAGNOSIS — D680 Von Willebrand's disease: Secondary | ICD-10-CM | POA: Diagnosis present

## 2014-08-08 DIAGNOSIS — I252 Old myocardial infarction: Secondary | ICD-10-CM | POA: Diagnosis not present

## 2014-08-08 DIAGNOSIS — N390 Urinary tract infection, site not specified: Secondary | ICD-10-CM | POA: Diagnosis not present

## 2014-08-08 DIAGNOSIS — I480 Paroxysmal atrial fibrillation: Secondary | ICD-10-CM | POA: Diagnosis not present

## 2014-08-08 DIAGNOSIS — S7291XA Unspecified fracture of right femur, initial encounter for closed fracture: Secondary | ICD-10-CM | POA: Diagnosis not present

## 2014-08-08 DIAGNOSIS — I4891 Unspecified atrial fibrillation: Secondary | ICD-10-CM | POA: Diagnosis not present

## 2014-08-08 DIAGNOSIS — G629 Polyneuropathy, unspecified: Secondary | ICD-10-CM | POA: Diagnosis not present

## 2014-08-08 DIAGNOSIS — S72341A Displaced spiral fracture of shaft of right femur, initial encounter for closed fracture: Secondary | ICD-10-CM | POA: Diagnosis not present

## 2014-08-08 DIAGNOSIS — M25561 Pain in right knee: Secondary | ICD-10-CM | POA: Diagnosis not present

## 2014-08-08 DIAGNOSIS — I340 Nonrheumatic mitral (valve) insufficiency: Secondary | ICD-10-CM | POA: Diagnosis not present

## 2014-08-08 DIAGNOSIS — N838 Other noninflammatory disorders of ovary, fallopian tube and broad ligament: Secondary | ICD-10-CM | POA: Diagnosis not present

## 2014-08-08 DIAGNOSIS — Z01811 Encounter for preprocedural respiratory examination: Secondary | ICD-10-CM | POA: Diagnosis not present

## 2014-08-20 DIAGNOSIS — R630 Anorexia: Secondary | ICD-10-CM | POA: Diagnosis not present

## 2014-08-20 DIAGNOSIS — M6281 Muscle weakness (generalized): Secondary | ICD-10-CM | POA: Diagnosis not present

## 2014-08-20 DIAGNOSIS — S72301A Unspecified fracture of shaft of right femur, initial encounter for closed fracture: Secondary | ICD-10-CM | POA: Diagnosis not present

## 2014-08-20 DIAGNOSIS — N390 Urinary tract infection, site not specified: Secondary | ICD-10-CM | POA: Diagnosis not present

## 2014-08-20 DIAGNOSIS — R1312 Dysphagia, oropharyngeal phase: Secondary | ICD-10-CM | POA: Diagnosis not present

## 2014-08-20 DIAGNOSIS — R5383 Other fatigue: Secondary | ICD-10-CM | POA: Diagnosis not present

## 2014-08-20 DIAGNOSIS — S72301K Unspecified fracture of shaft of right femur, subsequent encounter for closed fracture with nonunion: Secondary | ICD-10-CM | POA: Diagnosis not present

## 2014-08-20 DIAGNOSIS — E785 Hyperlipidemia, unspecified: Secondary | ICD-10-CM | POA: Diagnosis not present

## 2014-08-20 DIAGNOSIS — F339 Major depressive disorder, recurrent, unspecified: Secondary | ICD-10-CM | POA: Diagnosis not present

## 2014-08-20 DIAGNOSIS — J449 Chronic obstructive pulmonary disease, unspecified: Secondary | ICD-10-CM | POA: Diagnosis not present

## 2014-08-20 DIAGNOSIS — S72001A Fracture of unspecified part of neck of right femur, initial encounter for closed fracture: Secondary | ICD-10-CM | POA: Diagnosis not present

## 2014-08-20 DIAGNOSIS — M1611 Unilateral primary osteoarthritis, right hip: Secondary | ICD-10-CM | POA: Diagnosis not present

## 2014-08-20 DIAGNOSIS — R279 Unspecified lack of coordination: Secondary | ICD-10-CM | POA: Diagnosis not present

## 2014-08-20 DIAGNOSIS — Z7901 Long term (current) use of anticoagulants: Secondary | ICD-10-CM | POA: Diagnosis not present

## 2014-08-20 DIAGNOSIS — F0281 Dementia in other diseases classified elsewhere with behavioral disturbance: Secondary | ICD-10-CM | POA: Diagnosis not present

## 2014-08-20 DIAGNOSIS — M1711 Unilateral primary osteoarthritis, right knee: Secondary | ICD-10-CM | POA: Diagnosis not present

## 2014-08-20 DIAGNOSIS — I10 Essential (primary) hypertension: Secondary | ICD-10-CM | POA: Diagnosis not present

## 2014-08-20 DIAGNOSIS — I480 Paroxysmal atrial fibrillation: Secondary | ICD-10-CM | POA: Diagnosis not present

## 2014-08-20 DIAGNOSIS — M25561 Pain in right knee: Secondary | ICD-10-CM | POA: Diagnosis not present

## 2014-08-20 DIAGNOSIS — D649 Anemia, unspecified: Secondary | ICD-10-CM | POA: Diagnosis not present

## 2014-08-20 DIAGNOSIS — E119 Type 2 diabetes mellitus without complications: Secondary | ICD-10-CM | POA: Diagnosis not present

## 2014-08-20 DIAGNOSIS — D62 Acute posthemorrhagic anemia: Secondary | ICD-10-CM | POA: Diagnosis not present

## 2014-08-20 DIAGNOSIS — E039 Hypothyroidism, unspecified: Secondary | ICD-10-CM | POA: Diagnosis not present

## 2014-08-20 DIAGNOSIS — F0391 Unspecified dementia with behavioral disturbance: Secondary | ICD-10-CM | POA: Diagnosis not present

## 2014-08-20 DIAGNOSIS — R634 Abnormal weight loss: Secondary | ICD-10-CM | POA: Diagnosis not present

## 2014-08-20 DIAGNOSIS — I509 Heart failure, unspecified: Secondary | ICD-10-CM | POA: Diagnosis not present

## 2014-08-20 DIAGNOSIS — S72301D Unspecified fracture of shaft of right femur, subsequent encounter for closed fracture with routine healing: Secondary | ICD-10-CM | POA: Diagnosis not present

## 2014-08-20 DIAGNOSIS — F419 Anxiety disorder, unspecified: Secondary | ICD-10-CM | POA: Diagnosis not present

## 2014-08-20 DIAGNOSIS — E038 Other specified hypothyroidism: Secondary | ICD-10-CM | POA: Diagnosis not present

## 2014-08-20 DIAGNOSIS — D680 Von Willebrand's disease: Secondary | ICD-10-CM | POA: Diagnosis not present

## 2014-08-20 DIAGNOSIS — K219 Gastro-esophageal reflux disease without esophagitis: Secondary | ICD-10-CM | POA: Diagnosis not present

## 2014-08-21 DIAGNOSIS — I509 Heart failure, unspecified: Secondary | ICD-10-CM | POA: Diagnosis not present

## 2014-08-21 DIAGNOSIS — D62 Acute posthemorrhagic anemia: Secondary | ICD-10-CM | POA: Diagnosis not present

## 2014-08-21 DIAGNOSIS — F0281 Dementia in other diseases classified elsewhere with behavioral disturbance: Secondary | ICD-10-CM | POA: Diagnosis not present

## 2014-08-21 DIAGNOSIS — E039 Hypothyroidism, unspecified: Secondary | ICD-10-CM | POA: Diagnosis not present

## 2014-08-24 DIAGNOSIS — Z7901 Long term (current) use of anticoagulants: Secondary | ICD-10-CM | POA: Diagnosis not present

## 2014-08-27 DIAGNOSIS — D680 Von Willebrand's disease: Secondary | ICD-10-CM | POA: Diagnosis not present

## 2014-08-27 DIAGNOSIS — E039 Hypothyroidism, unspecified: Secondary | ICD-10-CM | POA: Diagnosis not present

## 2014-08-27 DIAGNOSIS — I509 Heart failure, unspecified: Secondary | ICD-10-CM | POA: Diagnosis not present

## 2014-08-27 DIAGNOSIS — E119 Type 2 diabetes mellitus without complications: Secondary | ICD-10-CM | POA: Diagnosis not present

## 2014-08-29 DIAGNOSIS — S72301A Unspecified fracture of shaft of right femur, initial encounter for closed fracture: Secondary | ICD-10-CM | POA: Diagnosis not present

## 2014-09-02 DIAGNOSIS — E119 Type 2 diabetes mellitus without complications: Secondary | ICD-10-CM | POA: Diagnosis not present

## 2014-09-02 DIAGNOSIS — J449 Chronic obstructive pulmonary disease, unspecified: Secondary | ICD-10-CM | POA: Diagnosis not present

## 2014-09-02 DIAGNOSIS — I10 Essential (primary) hypertension: Secondary | ICD-10-CM | POA: Diagnosis not present

## 2014-09-02 DIAGNOSIS — E038 Other specified hypothyroidism: Secondary | ICD-10-CM | POA: Diagnosis not present

## 2014-09-05 NOTE — Progress Notes (Signed)
This encounter was created in error - please disregard.

## 2014-09-26 DIAGNOSIS — S72301A Unspecified fracture of shaft of right femur, initial encounter for closed fracture: Secondary | ICD-10-CM | POA: Diagnosis not present

## 2014-09-30 DIAGNOSIS — E038 Other specified hypothyroidism: Secondary | ICD-10-CM | POA: Diagnosis not present

## 2014-09-30 DIAGNOSIS — I10 Essential (primary) hypertension: Secondary | ICD-10-CM | POA: Diagnosis not present

## 2014-09-30 DIAGNOSIS — J449 Chronic obstructive pulmonary disease, unspecified: Secondary | ICD-10-CM | POA: Diagnosis not present

## 2014-09-30 DIAGNOSIS — E119 Type 2 diabetes mellitus without complications: Secondary | ICD-10-CM | POA: Diagnosis not present

## 2014-10-04 DIAGNOSIS — R630 Anorexia: Secondary | ICD-10-CM | POA: Diagnosis not present

## 2014-10-04 DIAGNOSIS — R634 Abnormal weight loss: Secondary | ICD-10-CM | POA: Diagnosis not present

## 2014-11-04 DIAGNOSIS — I10 Essential (primary) hypertension: Secondary | ICD-10-CM | POA: Diagnosis not present

## 2014-11-04 DIAGNOSIS — E119 Type 2 diabetes mellitus without complications: Secondary | ICD-10-CM | POA: Diagnosis not present

## 2014-11-04 DIAGNOSIS — E038 Other specified hypothyroidism: Secondary | ICD-10-CM | POA: Diagnosis not present

## 2014-11-04 DIAGNOSIS — J449 Chronic obstructive pulmonary disease, unspecified: Secondary | ICD-10-CM | POA: Diagnosis not present

## 2014-11-07 DIAGNOSIS — S72301A Unspecified fracture of shaft of right femur, initial encounter for closed fracture: Secondary | ICD-10-CM | POA: Diagnosis not present

## 2014-11-22 DIAGNOSIS — I509 Heart failure, unspecified: Secondary | ICD-10-CM | POA: Diagnosis not present

## 2014-11-22 DIAGNOSIS — N390 Urinary tract infection, site not specified: Secondary | ICD-10-CM | POA: Diagnosis not present

## 2014-11-22 DIAGNOSIS — F0281 Dementia in other diseases classified elsewhere with behavioral disturbance: Secondary | ICD-10-CM | POA: Diagnosis not present

## 2014-11-22 DIAGNOSIS — S72301D Unspecified fracture of shaft of right femur, subsequent encounter for closed fracture with routine healing: Secondary | ICD-10-CM | POA: Diagnosis not present

## 2014-11-27 DIAGNOSIS — I509 Heart failure, unspecified: Secondary | ICD-10-CM | POA: Diagnosis not present

## 2014-11-27 DIAGNOSIS — I480 Paroxysmal atrial fibrillation: Secondary | ICD-10-CM | POA: Diagnosis not present

## 2014-11-27 DIAGNOSIS — E119 Type 2 diabetes mellitus without complications: Secondary | ICD-10-CM | POA: Diagnosis not present

## 2014-11-27 DIAGNOSIS — D680 Von Willebrand's disease: Secondary | ICD-10-CM | POA: Diagnosis not present

## 2014-11-27 DIAGNOSIS — S72301D Unspecified fracture of shaft of right femur, subsequent encounter for closed fracture with routine healing: Secondary | ICD-10-CM | POA: Diagnosis not present

## 2014-11-28 DIAGNOSIS — N390 Urinary tract infection, site not specified: Secondary | ICD-10-CM | POA: Diagnosis not present

## 2014-11-28 DIAGNOSIS — R4182 Altered mental status, unspecified: Secondary | ICD-10-CM | POA: Diagnosis not present

## 2014-11-29 DIAGNOSIS — Z7901 Long term (current) use of anticoagulants: Secondary | ICD-10-CM | POA: Diagnosis not present

## 2014-12-01 DIAGNOSIS — I480 Paroxysmal atrial fibrillation: Secondary | ICD-10-CM | POA: Diagnosis not present

## 2014-12-03 DIAGNOSIS — Z7901 Long term (current) use of anticoagulants: Secondary | ICD-10-CM | POA: Diagnosis not present

## 2014-12-06 DIAGNOSIS — I480 Paroxysmal atrial fibrillation: Secondary | ICD-10-CM | POA: Diagnosis not present

## 2014-12-07 DIAGNOSIS — M25551 Pain in right hip: Secondary | ICD-10-CM | POA: Diagnosis not present

## 2014-12-07 DIAGNOSIS — F321 Major depressive disorder, single episode, moderate: Secondary | ICD-10-CM | POA: Diagnosis not present

## 2014-12-07 DIAGNOSIS — R609 Edema, unspecified: Secondary | ICD-10-CM | POA: Diagnosis not present

## 2014-12-07 DIAGNOSIS — I509 Heart failure, unspecified: Secondary | ICD-10-CM | POA: Diagnosis not present

## 2014-12-07 DIAGNOSIS — J449 Chronic obstructive pulmonary disease, unspecified: Secondary | ICD-10-CM | POA: Diagnosis not present

## 2014-12-07 DIAGNOSIS — F411 Generalized anxiety disorder: Secondary | ICD-10-CM | POA: Diagnosis not present

## 2014-12-09 DIAGNOSIS — E119 Type 2 diabetes mellitus without complications: Secondary | ICD-10-CM | POA: Diagnosis not present

## 2014-12-09 DIAGNOSIS — I480 Paroxysmal atrial fibrillation: Secondary | ICD-10-CM | POA: Diagnosis not present

## 2014-12-09 DIAGNOSIS — M25551 Pain in right hip: Secondary | ICD-10-CM | POA: Diagnosis not present

## 2014-12-09 DIAGNOSIS — E038 Other specified hypothyroidism: Secondary | ICD-10-CM | POA: Diagnosis not present

## 2014-12-09 DIAGNOSIS — I10 Essential (primary) hypertension: Secondary | ICD-10-CM | POA: Diagnosis not present

## 2014-12-14 DIAGNOSIS — I480 Paroxysmal atrial fibrillation: Secondary | ICD-10-CM | POA: Diagnosis not present

## 2014-12-17 DIAGNOSIS — Z7901 Long term (current) use of anticoagulants: Secondary | ICD-10-CM | POA: Diagnosis not present

## 2014-12-20 DIAGNOSIS — Z79899 Other long term (current) drug therapy: Secondary | ICD-10-CM | POA: Diagnosis not present

## 2014-12-24 DIAGNOSIS — Z7901 Long term (current) use of anticoagulants: Secondary | ICD-10-CM | POA: Diagnosis not present

## 2014-12-28 DIAGNOSIS — R21 Rash and other nonspecific skin eruption: Secondary | ICD-10-CM | POA: Diagnosis not present

## 2014-12-28 DIAGNOSIS — I481 Persistent atrial fibrillation: Secondary | ICD-10-CM | POA: Diagnosis not present

## 2014-12-30 DIAGNOSIS — I517 Cardiomegaly: Secondary | ICD-10-CM | POA: Diagnosis not present

## 2014-12-30 DIAGNOSIS — J811 Chronic pulmonary edema: Secondary | ICD-10-CM | POA: Diagnosis not present

## 2015-01-01 DIAGNOSIS — Z7901 Long term (current) use of anticoagulants: Secondary | ICD-10-CM | POA: Diagnosis not present

## 2015-01-05 DIAGNOSIS — I10 Essential (primary) hypertension: Secondary | ICD-10-CM | POA: Diagnosis not present

## 2015-01-05 DIAGNOSIS — E119 Type 2 diabetes mellitus without complications: Secondary | ICD-10-CM | POA: Diagnosis not present

## 2015-01-05 DIAGNOSIS — F0281 Dementia in other diseases classified elsewhere with behavioral disturbance: Secondary | ICD-10-CM | POA: Diagnosis not present

## 2015-01-05 DIAGNOSIS — E038 Other specified hypothyroidism: Secondary | ICD-10-CM | POA: Diagnosis not present

## 2015-01-08 DIAGNOSIS — R791 Abnormal coagulation profile: Secondary | ICD-10-CM | POA: Diagnosis not present

## 2015-01-08 DIAGNOSIS — I480 Paroxysmal atrial fibrillation: Secondary | ICD-10-CM | POA: Diagnosis not present

## 2015-01-08 DIAGNOSIS — Z7901 Long term (current) use of anticoagulants: Secondary | ICD-10-CM | POA: Diagnosis not present

## 2015-01-09 DIAGNOSIS — R791 Abnormal coagulation profile: Secondary | ICD-10-CM | POA: Diagnosis not present

## 2015-01-11 DIAGNOSIS — Z7901 Long term (current) use of anticoagulants: Secondary | ICD-10-CM | POA: Diagnosis not present

## 2015-01-13 DIAGNOSIS — Z7901 Long term (current) use of anticoagulants: Secondary | ICD-10-CM | POA: Diagnosis not present

## 2015-01-15 DIAGNOSIS — Z7901 Long term (current) use of anticoagulants: Secondary | ICD-10-CM | POA: Diagnosis not present

## 2015-01-20 DIAGNOSIS — Z7901 Long term (current) use of anticoagulants: Secondary | ICD-10-CM | POA: Diagnosis not present

## 2015-01-21 DIAGNOSIS — I480 Paroxysmal atrial fibrillation: Secondary | ICD-10-CM | POA: Diagnosis not present

## 2015-01-21 DIAGNOSIS — R1311 Dysphagia, oral phase: Secondary | ICD-10-CM | POA: Diagnosis not present

## 2015-01-22 DIAGNOSIS — I480 Paroxysmal atrial fibrillation: Secondary | ICD-10-CM | POA: Diagnosis not present

## 2015-01-22 DIAGNOSIS — R1311 Dysphagia, oral phase: Secondary | ICD-10-CM | POA: Diagnosis not present

## 2015-01-23 DIAGNOSIS — I480 Paroxysmal atrial fibrillation: Secondary | ICD-10-CM | POA: Diagnosis not present

## 2015-01-23 DIAGNOSIS — R1311 Dysphagia, oral phase: Secondary | ICD-10-CM | POA: Diagnosis not present

## 2015-01-24 DIAGNOSIS — I480 Paroxysmal atrial fibrillation: Secondary | ICD-10-CM | POA: Diagnosis not present

## 2015-01-24 DIAGNOSIS — R1311 Dysphagia, oral phase: Secondary | ICD-10-CM | POA: Diagnosis not present

## 2015-01-24 DIAGNOSIS — Z7901 Long term (current) use of anticoagulants: Secondary | ICD-10-CM | POA: Diagnosis not present

## 2015-01-25 DIAGNOSIS — R1311 Dysphagia, oral phase: Secondary | ICD-10-CM | POA: Diagnosis not present

## 2015-01-25 DIAGNOSIS — I480 Paroxysmal atrial fibrillation: Secondary | ICD-10-CM | POA: Diagnosis not present

## 2015-01-25 DIAGNOSIS — I4892 Unspecified atrial flutter: Secondary | ICD-10-CM | POA: Diagnosis not present

## 2015-01-29 DIAGNOSIS — Z7901 Long term (current) use of anticoagulants: Secondary | ICD-10-CM | POA: Diagnosis not present

## 2015-02-01 DIAGNOSIS — J449 Chronic obstructive pulmonary disease, unspecified: Secondary | ICD-10-CM | POA: Diagnosis not present

## 2015-02-01 DIAGNOSIS — R600 Localized edema: Secondary | ICD-10-CM | POA: Diagnosis not present

## 2015-02-01 DIAGNOSIS — R0989 Other specified symptoms and signs involving the circulatory and respiratory systems: Secondary | ICD-10-CM | POA: Diagnosis not present

## 2015-02-04 DIAGNOSIS — I771 Stricture of artery: Secondary | ICD-10-CM | POA: Diagnosis not present

## 2015-02-07 DIAGNOSIS — Z7901 Long term (current) use of anticoagulants: Secondary | ICD-10-CM | POA: Diagnosis not present

## 2015-02-08 DIAGNOSIS — F411 Generalized anxiety disorder: Secondary | ICD-10-CM | POA: Diagnosis not present

## 2015-02-08 DIAGNOSIS — F321 Major depressive disorder, single episode, moderate: Secondary | ICD-10-CM | POA: Diagnosis not present

## 2015-02-10 DIAGNOSIS — Z79899 Other long term (current) drug therapy: Secondary | ICD-10-CM | POA: Diagnosis not present

## 2015-02-10 DIAGNOSIS — E785 Hyperlipidemia, unspecified: Secondary | ICD-10-CM | POA: Diagnosis not present

## 2015-02-10 DIAGNOSIS — I480 Paroxysmal atrial fibrillation: Secondary | ICD-10-CM | POA: Diagnosis not present

## 2015-02-10 DIAGNOSIS — D649 Anemia, unspecified: Secondary | ICD-10-CM | POA: Diagnosis not present

## 2015-02-11 DIAGNOSIS — J449 Chronic obstructive pulmonary disease, unspecified: Secondary | ICD-10-CM | POA: Diagnosis not present

## 2015-02-11 DIAGNOSIS — Z79899 Other long term (current) drug therapy: Secondary | ICD-10-CM | POA: Diagnosis not present

## 2015-02-11 DIAGNOSIS — F0281 Dementia in other diseases classified elsewhere with behavioral disturbance: Secondary | ICD-10-CM | POA: Diagnosis not present

## 2015-02-11 DIAGNOSIS — S72301A Unspecified fracture of shaft of right femur, initial encounter for closed fracture: Secondary | ICD-10-CM | POA: Diagnosis not present

## 2015-02-11 DIAGNOSIS — R600 Localized edema: Secondary | ICD-10-CM | POA: Diagnosis not present

## 2015-02-13 DIAGNOSIS — B351 Tinea unguium: Secondary | ICD-10-CM | POA: Diagnosis not present

## 2015-02-13 DIAGNOSIS — I739 Peripheral vascular disease, unspecified: Secondary | ICD-10-CM | POA: Diagnosis not present

## 2015-02-13 DIAGNOSIS — E119 Type 2 diabetes mellitus without complications: Secondary | ICD-10-CM | POA: Diagnosis not present

## 2015-02-13 DIAGNOSIS — M79671 Pain in right foot: Secondary | ICD-10-CM | POA: Diagnosis not present

## 2015-02-14 DIAGNOSIS — N390 Urinary tract infection, site not specified: Secondary | ICD-10-CM | POA: Diagnosis not present

## 2015-02-14 DIAGNOSIS — Z7901 Long term (current) use of anticoagulants: Secondary | ICD-10-CM | POA: Diagnosis not present

## 2015-02-14 DIAGNOSIS — S0910XA Unspecified injury of muscle and tendon of head, initial encounter: Secondary | ICD-10-CM | POA: Diagnosis not present

## 2015-02-14 DIAGNOSIS — S0083XA Contusion of other part of head, initial encounter: Secondary | ICD-10-CM | POA: Diagnosis not present

## 2015-02-14 DIAGNOSIS — R05 Cough: Secondary | ICD-10-CM | POA: Diagnosis not present

## 2015-02-14 DIAGNOSIS — R0602 Shortness of breath: Secondary | ICD-10-CM | POA: Diagnosis not present

## 2015-02-14 DIAGNOSIS — F0281 Dementia in other diseases classified elsewhere with behavioral disturbance: Secondary | ICD-10-CM | POA: Diagnosis not present

## 2015-02-14 DIAGNOSIS — S0990XA Unspecified injury of head, initial encounter: Secondary | ICD-10-CM | POA: Diagnosis not present

## 2015-02-14 DIAGNOSIS — I5021 Acute systolic (congestive) heart failure: Secondary | ICD-10-CM | POA: Diagnosis not present

## 2015-02-14 DIAGNOSIS — F0390 Unspecified dementia without behavioral disturbance: Secondary | ICD-10-CM | POA: Diagnosis not present

## 2015-02-14 DIAGNOSIS — E1151 Type 2 diabetes mellitus with diabetic peripheral angiopathy without gangrene: Secondary | ICD-10-CM | POA: Diagnosis not present

## 2015-02-14 DIAGNOSIS — J449 Chronic obstructive pulmonary disease, unspecified: Secondary | ICD-10-CM | POA: Diagnosis not present

## 2015-02-15 DIAGNOSIS — N390 Urinary tract infection, site not specified: Secondary | ICD-10-CM | POA: Diagnosis not present

## 2015-02-17 DIAGNOSIS — J449 Chronic obstructive pulmonary disease, unspecified: Secondary | ICD-10-CM | POA: Diagnosis not present

## 2015-02-17 DIAGNOSIS — R600 Localized edema: Secondary | ICD-10-CM | POA: Diagnosis not present

## 2015-02-17 DIAGNOSIS — F0281 Dementia in other diseases classified elsewhere with behavioral disturbance: Secondary | ICD-10-CM | POA: Diagnosis not present

## 2015-02-19 DIAGNOSIS — R1311 Dysphagia, oral phase: Secondary | ICD-10-CM | POA: Diagnosis not present

## 2015-02-19 DIAGNOSIS — I480 Paroxysmal atrial fibrillation: Secondary | ICD-10-CM | POA: Diagnosis not present

## 2015-02-20 DIAGNOSIS — N390 Urinary tract infection, site not specified: Secondary | ICD-10-CM | POA: Diagnosis not present

## 2015-02-20 DIAGNOSIS — Z7901 Long term (current) use of anticoagulants: Secondary | ICD-10-CM | POA: Diagnosis not present

## 2015-02-20 DIAGNOSIS — S0083XD Contusion of other part of head, subsequent encounter: Secondary | ICD-10-CM | POA: Diagnosis not present

## 2015-02-21 DIAGNOSIS — Z7901 Long term (current) use of anticoagulants: Secondary | ICD-10-CM | POA: Diagnosis not present

## 2015-02-21 DIAGNOSIS — E038 Other specified hypothyroidism: Secondary | ICD-10-CM | POA: Diagnosis not present

## 2015-02-21 DIAGNOSIS — I10 Essential (primary) hypertension: Secondary | ICD-10-CM | POA: Diagnosis not present

## 2015-02-21 DIAGNOSIS — Z79899 Other long term (current) drug therapy: Secondary | ICD-10-CM | POA: Diagnosis not present

## 2015-02-21 DIAGNOSIS — E119 Type 2 diabetes mellitus without complications: Secondary | ICD-10-CM | POA: Diagnosis not present

## 2015-02-21 DIAGNOSIS — D649 Anemia, unspecified: Secondary | ICD-10-CM | POA: Diagnosis not present

## 2015-02-24 DIAGNOSIS — Z7901 Long term (current) use of anticoagulants: Secondary | ICD-10-CM | POA: Diagnosis not present

## 2015-02-28 DIAGNOSIS — I480 Paroxysmal atrial fibrillation: Secondary | ICD-10-CM | POA: Diagnosis not present

## 2015-03-04 DIAGNOSIS — Z7901 Long term (current) use of anticoagulants: Secondary | ICD-10-CM | POA: Diagnosis not present

## 2015-03-15 DIAGNOSIS — F321 Major depressive disorder, single episode, moderate: Secondary | ICD-10-CM | POA: Diagnosis not present

## 2015-03-15 DIAGNOSIS — F411 Generalized anxiety disorder: Secondary | ICD-10-CM | POA: Diagnosis not present

## 2015-03-16 DIAGNOSIS — D649 Anemia, unspecified: Secondary | ICD-10-CM | POA: Diagnosis not present

## 2015-03-16 DIAGNOSIS — E038 Other specified hypothyroidism: Secondary | ICD-10-CM | POA: Diagnosis not present

## 2015-03-16 DIAGNOSIS — E119 Type 2 diabetes mellitus without complications: Secondary | ICD-10-CM | POA: Diagnosis not present

## 2015-03-16 DIAGNOSIS — I10 Essential (primary) hypertension: Secondary | ICD-10-CM | POA: Diagnosis not present

## 2015-03-19 DIAGNOSIS — Z7901 Long term (current) use of anticoagulants: Secondary | ICD-10-CM | POA: Diagnosis not present

## 2015-03-26 DIAGNOSIS — Z7901 Long term (current) use of anticoagulants: Secondary | ICD-10-CM | POA: Diagnosis not present

## 2015-03-27 DIAGNOSIS — F320 Major depressive disorder, single episode, mild: Secondary | ICD-10-CM | POA: Diagnosis not present

## 2015-03-27 DIAGNOSIS — F0390 Unspecified dementia without behavioral disturbance: Secondary | ICD-10-CM | POA: Diagnosis not present

## 2015-03-29 DIAGNOSIS — Z7901 Long term (current) use of anticoagulants: Secondary | ICD-10-CM | POA: Diagnosis not present

## 2015-04-01 DIAGNOSIS — Z7901 Long term (current) use of anticoagulants: Secondary | ICD-10-CM | POA: Diagnosis not present

## 2015-04-04 DIAGNOSIS — Z7901 Long term (current) use of anticoagulants: Secondary | ICD-10-CM | POA: Diagnosis not present

## 2015-04-07 DIAGNOSIS — N390 Urinary tract infection, site not specified: Secondary | ICD-10-CM | POA: Diagnosis not present

## 2015-04-08 DIAGNOSIS — Z7901 Long term (current) use of anticoagulants: Secondary | ICD-10-CM | POA: Diagnosis not present

## 2015-04-08 DIAGNOSIS — I480 Paroxysmal atrial fibrillation: Secondary | ICD-10-CM | POA: Diagnosis not present

## 2015-04-08 DIAGNOSIS — H353133 Nonexudative age-related macular degeneration, bilateral, advanced atrophic without subfoveal involvement: Secondary | ICD-10-CM | POA: Diagnosis not present

## 2015-04-08 DIAGNOSIS — R262 Difficulty in walking, not elsewhere classified: Secondary | ICD-10-CM | POA: Diagnosis not present

## 2015-04-08 DIAGNOSIS — Z794 Long term (current) use of insulin: Secondary | ICD-10-CM | POA: Diagnosis not present

## 2015-04-08 DIAGNOSIS — E119 Type 2 diabetes mellitus without complications: Secondary | ICD-10-CM | POA: Diagnosis not present

## 2015-04-09 DIAGNOSIS — I480 Paroxysmal atrial fibrillation: Secondary | ICD-10-CM | POA: Diagnosis not present

## 2015-04-09 DIAGNOSIS — R262 Difficulty in walking, not elsewhere classified: Secondary | ICD-10-CM | POA: Diagnosis not present

## 2015-04-10 DIAGNOSIS — I480 Paroxysmal atrial fibrillation: Secondary | ICD-10-CM | POA: Diagnosis not present

## 2015-04-10 DIAGNOSIS — R262 Difficulty in walking, not elsewhere classified: Secondary | ICD-10-CM | POA: Diagnosis not present

## 2015-04-11 DIAGNOSIS — I480 Paroxysmal atrial fibrillation: Secondary | ICD-10-CM | POA: Diagnosis not present

## 2015-04-11 DIAGNOSIS — R262 Difficulty in walking, not elsewhere classified: Secondary | ICD-10-CM | POA: Diagnosis not present

## 2015-04-11 DIAGNOSIS — I4891 Unspecified atrial fibrillation: Secondary | ICD-10-CM | POA: Diagnosis not present

## 2015-04-12 DIAGNOSIS — F411 Generalized anxiety disorder: Secondary | ICD-10-CM | POA: Diagnosis not present

## 2015-04-12 DIAGNOSIS — F321 Major depressive disorder, single episode, moderate: Secondary | ICD-10-CM | POA: Diagnosis not present

## 2015-04-12 DIAGNOSIS — I480 Paroxysmal atrial fibrillation: Secondary | ICD-10-CM | POA: Diagnosis not present

## 2015-04-12 DIAGNOSIS — R262 Difficulty in walking, not elsewhere classified: Secondary | ICD-10-CM | POA: Diagnosis not present

## 2015-04-14 DIAGNOSIS — R262 Difficulty in walking, not elsewhere classified: Secondary | ICD-10-CM | POA: Diagnosis not present

## 2015-04-14 DIAGNOSIS — Z7901 Long term (current) use of anticoagulants: Secondary | ICD-10-CM | POA: Diagnosis not present

## 2015-04-14 DIAGNOSIS — I480 Paroxysmal atrial fibrillation: Secondary | ICD-10-CM | POA: Diagnosis not present

## 2015-04-15 DIAGNOSIS — R262 Difficulty in walking, not elsewhere classified: Secondary | ICD-10-CM | POA: Diagnosis not present

## 2015-04-15 DIAGNOSIS — I480 Paroxysmal atrial fibrillation: Secondary | ICD-10-CM | POA: Diagnosis not present

## 2015-04-16 DIAGNOSIS — J449 Chronic obstructive pulmonary disease, unspecified: Secondary | ICD-10-CM | POA: Diagnosis not present

## 2015-04-16 DIAGNOSIS — I517 Cardiomegaly: Secondary | ICD-10-CM | POA: Diagnosis not present

## 2015-04-16 DIAGNOSIS — R05 Cough: Secondary | ICD-10-CM | POA: Diagnosis not present

## 2015-04-16 DIAGNOSIS — I480 Paroxysmal atrial fibrillation: Secondary | ICD-10-CM | POA: Diagnosis not present

## 2015-04-16 DIAGNOSIS — R262 Difficulty in walking, not elsewhere classified: Secondary | ICD-10-CM | POA: Diagnosis not present

## 2015-04-17 DIAGNOSIS — D649 Anemia, unspecified: Secondary | ICD-10-CM | POA: Diagnosis not present

## 2015-04-17 DIAGNOSIS — I10 Essential (primary) hypertension: Secondary | ICD-10-CM | POA: Diagnosis not present

## 2015-04-17 DIAGNOSIS — I480 Paroxysmal atrial fibrillation: Secondary | ICD-10-CM | POA: Diagnosis not present

## 2015-04-17 DIAGNOSIS — R262 Difficulty in walking, not elsewhere classified: Secondary | ICD-10-CM | POA: Diagnosis not present

## 2015-04-17 DIAGNOSIS — Z7901 Long term (current) use of anticoagulants: Secondary | ICD-10-CM | POA: Diagnosis not present

## 2015-04-18 DIAGNOSIS — I480 Paroxysmal atrial fibrillation: Secondary | ICD-10-CM | POA: Diagnosis not present

## 2015-04-18 DIAGNOSIS — I482 Chronic atrial fibrillation: Secondary | ICD-10-CM | POA: Diagnosis not present

## 2015-04-18 DIAGNOSIS — R262 Difficulty in walking, not elsewhere classified: Secondary | ICD-10-CM | POA: Diagnosis not present

## 2015-04-21 DIAGNOSIS — I480 Paroxysmal atrial fibrillation: Secondary | ICD-10-CM | POA: Diagnosis not present

## 2015-04-21 DIAGNOSIS — R262 Difficulty in walking, not elsewhere classified: Secondary | ICD-10-CM | POA: Diagnosis not present

## 2015-04-23 DIAGNOSIS — I480 Paroxysmal atrial fibrillation: Secondary | ICD-10-CM | POA: Diagnosis not present

## 2015-04-23 DIAGNOSIS — R262 Difficulty in walking, not elsewhere classified: Secondary | ICD-10-CM | POA: Diagnosis not present

## 2015-04-25 DIAGNOSIS — M79671 Pain in right foot: Secondary | ICD-10-CM | POA: Diagnosis not present

## 2015-04-25 DIAGNOSIS — B351 Tinea unguium: Secondary | ICD-10-CM | POA: Diagnosis not present

## 2015-04-25 DIAGNOSIS — Z7901 Long term (current) use of anticoagulants: Secondary | ICD-10-CM | POA: Diagnosis not present

## 2015-04-25 DIAGNOSIS — I739 Peripheral vascular disease, unspecified: Secondary | ICD-10-CM | POA: Diagnosis not present

## 2015-04-25 DIAGNOSIS — E119 Type 2 diabetes mellitus without complications: Secondary | ICD-10-CM | POA: Diagnosis not present

## 2015-04-27 DIAGNOSIS — Z7901 Long term (current) use of anticoagulants: Secondary | ICD-10-CM | POA: Diagnosis not present

## 2015-05-01 DIAGNOSIS — Z7901 Long term (current) use of anticoagulants: Secondary | ICD-10-CM | POA: Diagnosis not present

## 2015-05-13 DIAGNOSIS — Z7901 Long term (current) use of anticoagulants: Secondary | ICD-10-CM | POA: Diagnosis not present

## 2015-05-14 DIAGNOSIS — R0989 Other specified symptoms and signs involving the circulatory and respiratory systems: Secondary | ICD-10-CM | POA: Diagnosis not present

## 2015-05-17 DIAGNOSIS — I482 Chronic atrial fibrillation: Secondary | ICD-10-CM | POA: Diagnosis not present

## 2015-05-21 DIAGNOSIS — Z7901 Long term (current) use of anticoagulants: Secondary | ICD-10-CM | POA: Diagnosis not present

## 2015-05-24 DIAGNOSIS — Z7901 Long term (current) use of anticoagulants: Secondary | ICD-10-CM | POA: Diagnosis not present

## 2015-05-27 DIAGNOSIS — I4891 Unspecified atrial fibrillation: Secondary | ICD-10-CM | POA: Diagnosis not present

## 2015-05-27 DIAGNOSIS — I10 Essential (primary) hypertension: Secondary | ICD-10-CM | POA: Diagnosis not present

## 2015-05-27 DIAGNOSIS — D649 Anemia, unspecified: Secondary | ICD-10-CM | POA: Diagnosis not present

## 2015-05-27 DIAGNOSIS — Z7901 Long term (current) use of anticoagulants: Secondary | ICD-10-CM | POA: Diagnosis not present

## 2015-05-27 DIAGNOSIS — E785 Hyperlipidemia, unspecified: Secondary | ICD-10-CM | POA: Diagnosis not present

## 2015-05-27 DIAGNOSIS — I482 Chronic atrial fibrillation: Secondary | ICD-10-CM | POA: Diagnosis not present

## 2015-05-28 DIAGNOSIS — E119 Type 2 diabetes mellitus without complications: Secondary | ICD-10-CM | POA: Diagnosis not present

## 2015-05-30 DIAGNOSIS — Z7901 Long term (current) use of anticoagulants: Secondary | ICD-10-CM | POA: Diagnosis not present

## 2015-06-03 DIAGNOSIS — R791 Abnormal coagulation profile: Secondary | ICD-10-CM | POA: Diagnosis not present

## 2015-06-07 DIAGNOSIS — I4891 Unspecified atrial fibrillation: Secondary | ICD-10-CM | POA: Diagnosis not present

## 2015-06-07 DIAGNOSIS — E785 Hyperlipidemia, unspecified: Secondary | ICD-10-CM | POA: Diagnosis not present

## 2015-06-07 DIAGNOSIS — I10 Essential (primary) hypertension: Secondary | ICD-10-CM | POA: Diagnosis not present

## 2015-06-07 DIAGNOSIS — D649 Anemia, unspecified: Secondary | ICD-10-CM | POA: Diagnosis not present

## 2015-06-07 DIAGNOSIS — I482 Chronic atrial fibrillation: Secondary | ICD-10-CM | POA: Diagnosis not present

## 2015-06-07 DIAGNOSIS — Z7901 Long term (current) use of anticoagulants: Secondary | ICD-10-CM | POA: Diagnosis not present

## 2015-06-11 DIAGNOSIS — Z7901 Long term (current) use of anticoagulants: Secondary | ICD-10-CM | POA: Diagnosis not present

## 2015-06-12 DIAGNOSIS — M25461 Effusion, right knee: Secondary | ICD-10-CM | POA: Diagnosis not present

## 2015-06-12 DIAGNOSIS — I509 Heart failure, unspecified: Secondary | ICD-10-CM | POA: Diagnosis not present

## 2015-06-12 DIAGNOSIS — A419 Sepsis, unspecified organism: Secondary | ICD-10-CM | POA: Diagnosis not present

## 2015-06-12 DIAGNOSIS — E785 Hyperlipidemia, unspecified: Secondary | ICD-10-CM | POA: Diagnosis not present

## 2015-06-12 DIAGNOSIS — I10 Essential (primary) hypertension: Secondary | ICD-10-CM | POA: Diagnosis not present

## 2015-06-12 DIAGNOSIS — R0902 Hypoxemia: Secondary | ICD-10-CM | POA: Diagnosis not present

## 2015-06-12 DIAGNOSIS — I110 Hypertensive heart disease with heart failure: Secondary | ICD-10-CM | POA: Diagnosis present

## 2015-06-12 DIAGNOSIS — J440 Chronic obstructive pulmonary disease with acute lower respiratory infection: Secondary | ICD-10-CM | POA: Diagnosis present

## 2015-06-12 DIAGNOSIS — J9690 Respiratory failure, unspecified, unspecified whether with hypoxia or hypercapnia: Secondary | ICD-10-CM | POA: Diagnosis not present

## 2015-06-12 DIAGNOSIS — Z951 Presence of aortocoronary bypass graft: Secondary | ICD-10-CM | POA: Diagnosis not present

## 2015-06-12 DIAGNOSIS — F419 Anxiety disorder, unspecified: Secondary | ICD-10-CM | POA: Diagnosis not present

## 2015-06-12 DIAGNOSIS — R269 Unspecified abnormalities of gait and mobility: Secondary | ICD-10-CM | POA: Diagnosis present

## 2015-06-12 DIAGNOSIS — R279 Unspecified lack of coordination: Secondary | ICD-10-CM | POA: Diagnosis not present

## 2015-06-12 DIAGNOSIS — S72301K Unspecified fracture of shaft of right femur, subsequent encounter for closed fracture with nonunion: Secondary | ICD-10-CM | POA: Diagnosis not present

## 2015-06-12 DIAGNOSIS — G9341 Metabolic encephalopathy: Secondary | ICD-10-CM | POA: Diagnosis not present

## 2015-06-12 DIAGNOSIS — Z22322 Carrier or suspected carrier of Methicillin resistant Staphylococcus aureus: Secondary | ICD-10-CM | POA: Diagnosis not present

## 2015-06-12 DIAGNOSIS — Z87891 Personal history of nicotine dependence: Secondary | ICD-10-CM | POA: Diagnosis not present

## 2015-06-12 DIAGNOSIS — J449 Chronic obstructive pulmonary disease, unspecified: Secondary | ICD-10-CM | POA: Diagnosis not present

## 2015-06-12 DIAGNOSIS — B958 Unspecified staphylococcus as the cause of diseases classified elsewhere: Secondary | ICD-10-CM | POA: Diagnosis present

## 2015-06-12 DIAGNOSIS — I255 Ischemic cardiomyopathy: Secondary | ICD-10-CM | POA: Diagnosis present

## 2015-06-12 DIAGNOSIS — M6281 Muscle weakness (generalized): Secondary | ICD-10-CM | POA: Diagnosis not present

## 2015-06-12 DIAGNOSIS — Z950 Presence of cardiac pacemaker: Secondary | ICD-10-CM | POA: Diagnosis not present

## 2015-06-12 DIAGNOSIS — H1011 Acute atopic conjunctivitis, right eye: Secondary | ICD-10-CM | POA: Diagnosis not present

## 2015-06-12 DIAGNOSIS — I480 Paroxysmal atrial fibrillation: Secondary | ICD-10-CM | POA: Diagnosis not present

## 2015-06-12 DIAGNOSIS — R262 Difficulty in walking, not elsewhere classified: Secondary | ICD-10-CM | POA: Diagnosis not present

## 2015-06-12 DIAGNOSIS — N3000 Acute cystitis without hematuria: Secondary | ICD-10-CM | POA: Diagnosis present

## 2015-06-12 DIAGNOSIS — J690 Pneumonitis due to inhalation of food and vomit: Secondary | ICD-10-CM | POA: Diagnosis present

## 2015-06-12 DIAGNOSIS — F0391 Unspecified dementia with behavioral disturbance: Secondary | ICD-10-CM | POA: Diagnosis not present

## 2015-06-12 DIAGNOSIS — R6521 Severe sepsis with septic shock: Secondary | ICD-10-CM | POA: Diagnosis not present

## 2015-06-12 DIAGNOSIS — R4182 Altered mental status, unspecified: Secondary | ICD-10-CM | POA: Diagnosis not present

## 2015-06-12 DIAGNOSIS — I2510 Atherosclerotic heart disease of native coronary artery without angina pectoris: Secondary | ICD-10-CM | POA: Diagnosis present

## 2015-06-12 DIAGNOSIS — R5381 Other malaise: Secondary | ICD-10-CM | POA: Diagnosis present

## 2015-06-12 DIAGNOSIS — E876 Hypokalemia: Secondary | ICD-10-CM | POA: Diagnosis present

## 2015-06-12 DIAGNOSIS — F339 Major depressive disorder, recurrent, unspecified: Secondary | ICD-10-CM | POA: Diagnosis not present

## 2015-06-12 DIAGNOSIS — R1311 Dysphagia, oral phase: Secondary | ICD-10-CM | POA: Diagnosis not present

## 2015-06-12 DIAGNOSIS — E119 Type 2 diabetes mellitus without complications: Secondary | ICD-10-CM | POA: Diagnosis present

## 2015-06-12 DIAGNOSIS — R079 Chest pain, unspecified: Secondary | ICD-10-CM | POA: Diagnosis not present

## 2015-06-12 DIAGNOSIS — H353190 Nonexudative age-related macular degeneration, unspecified eye, stage unspecified: Secondary | ICD-10-CM | POA: Diagnosis not present

## 2015-06-12 DIAGNOSIS — N390 Urinary tract infection, site not specified: Secondary | ICD-10-CM | POA: Diagnosis not present

## 2015-06-12 DIAGNOSIS — I5022 Chronic systolic (congestive) heart failure: Secondary | ICD-10-CM | POA: Diagnosis present

## 2015-06-12 DIAGNOSIS — J9601 Acute respiratory failure with hypoxia: Secondary | ICD-10-CM | POA: Diagnosis present

## 2015-06-12 DIAGNOSIS — M25561 Pain in right knee: Secondary | ICD-10-CM | POA: Diagnosis not present

## 2015-06-12 DIAGNOSIS — J188 Other pneumonia, unspecified organism: Secondary | ICD-10-CM | POA: Diagnosis not present

## 2015-06-12 DIAGNOSIS — K219 Gastro-esophageal reflux disease without esophagitis: Secondary | ICD-10-CM | POA: Diagnosis not present

## 2015-06-12 DIAGNOSIS — Z794 Long term (current) use of insulin: Secondary | ICD-10-CM | POA: Diagnosis not present

## 2015-06-12 DIAGNOSIS — F0390 Unspecified dementia without behavioral disturbance: Secondary | ICD-10-CM | POA: Diagnosis present

## 2015-06-12 DIAGNOSIS — Z7901 Long term (current) use of anticoagulants: Secondary | ICD-10-CM | POA: Diagnosis not present

## 2015-06-12 DIAGNOSIS — D680 Von Willebrand's disease: Secondary | ICD-10-CM | POA: Diagnosis present

## 2015-06-12 DIAGNOSIS — E039 Hypothyroidism, unspecified: Secondary | ICD-10-CM | POA: Diagnosis not present

## 2015-06-12 DIAGNOSIS — R634 Abnormal weight loss: Secondary | ICD-10-CM | POA: Diagnosis not present

## 2015-06-15 DIAGNOSIS — Z7901 Long term (current) use of anticoagulants: Secondary | ICD-10-CM | POA: Diagnosis not present

## 2015-06-16 DIAGNOSIS — K219 Gastro-esophageal reflux disease without esophagitis: Secondary | ICD-10-CM | POA: Diagnosis not present

## 2015-06-16 DIAGNOSIS — N3000 Acute cystitis without hematuria: Secondary | ICD-10-CM | POA: Diagnosis not present

## 2015-06-16 DIAGNOSIS — F419 Anxiety disorder, unspecified: Secondary | ICD-10-CM | POA: Diagnosis not present

## 2015-06-16 DIAGNOSIS — M79603 Pain in arm, unspecified: Secondary | ICD-10-CM | POA: Diagnosis not present

## 2015-06-16 DIAGNOSIS — R634 Abnormal weight loss: Secondary | ICD-10-CM | POA: Diagnosis not present

## 2015-06-16 DIAGNOSIS — Z79899 Other long term (current) drug therapy: Secondary | ICD-10-CM | POA: Diagnosis not present

## 2015-06-16 DIAGNOSIS — I480 Paroxysmal atrial fibrillation: Secondary | ICD-10-CM | POA: Diagnosis not present

## 2015-06-16 DIAGNOSIS — Z043 Encounter for examination and observation following other accident: Secondary | ICD-10-CM | POA: Diagnosis not present

## 2015-06-16 DIAGNOSIS — F321 Major depressive disorder, single episode, moderate: Secondary | ICD-10-CM | POA: Diagnosis not present

## 2015-06-16 DIAGNOSIS — F411 Generalized anxiety disorder: Secondary | ICD-10-CM | POA: Diagnosis not present

## 2015-06-16 DIAGNOSIS — J188 Other pneumonia, unspecified organism: Secondary | ICD-10-CM | POA: Diagnosis not present

## 2015-06-16 DIAGNOSIS — H1011 Acute atopic conjunctivitis, right eye: Secondary | ICD-10-CM | POA: Diagnosis not present

## 2015-06-16 DIAGNOSIS — S0083XA Contusion of other part of head, initial encounter: Secondary | ICD-10-CM | POA: Diagnosis not present

## 2015-06-16 DIAGNOSIS — I5030 Unspecified diastolic (congestive) heart failure: Secondary | ICD-10-CM | POA: Diagnosis not present

## 2015-06-16 DIAGNOSIS — S72301K Unspecified fracture of shaft of right femur, subsequent encounter for closed fracture with nonunion: Secondary | ICD-10-CM | POA: Diagnosis not present

## 2015-06-16 DIAGNOSIS — I10 Essential (primary) hypertension: Secondary | ICD-10-CM | POA: Diagnosis not present

## 2015-06-16 DIAGNOSIS — M6281 Muscle weakness (generalized): Secondary | ICD-10-CM | POA: Diagnosis not present

## 2015-06-16 DIAGNOSIS — E039 Hypothyroidism, unspecified: Secondary | ICD-10-CM | POA: Diagnosis not present

## 2015-06-16 DIAGNOSIS — M25551 Pain in right hip: Secondary | ICD-10-CM | POA: Diagnosis not present

## 2015-06-16 DIAGNOSIS — T148 Other injury of unspecified body region: Secondary | ICD-10-CM | POA: Diagnosis not present

## 2015-06-16 DIAGNOSIS — M25561 Pain in right knee: Secondary | ICD-10-CM | POA: Diagnosis not present

## 2015-06-16 DIAGNOSIS — I2510 Atherosclerotic heart disease of native coronary artery without angina pectoris: Secondary | ICD-10-CM | POA: Diagnosis not present

## 2015-06-16 DIAGNOSIS — M79604 Pain in right leg: Secondary | ICD-10-CM | POA: Diagnosis not present

## 2015-06-16 DIAGNOSIS — N390 Urinary tract infection, site not specified: Secondary | ICD-10-CM | POA: Diagnosis not present

## 2015-06-16 DIAGNOSIS — R0989 Other specified symptoms and signs involving the circulatory and respiratory systems: Secondary | ICD-10-CM | POA: Diagnosis not present

## 2015-06-16 DIAGNOSIS — I509 Heart failure, unspecified: Secondary | ICD-10-CM | POA: Diagnosis not present

## 2015-06-16 DIAGNOSIS — F0391 Unspecified dementia with behavioral disturbance: Secondary | ICD-10-CM | POA: Diagnosis not present

## 2015-06-16 DIAGNOSIS — E7800 Pure hypercholesterolemia, unspecified: Secondary | ICD-10-CM | POA: Diagnosis not present

## 2015-06-16 DIAGNOSIS — Z743 Need for continuous supervision: Secondary | ICD-10-CM | POA: Diagnosis not present

## 2015-06-16 DIAGNOSIS — F0281 Dementia in other diseases classified elsewhere with behavioral disturbance: Secondary | ICD-10-CM | POA: Diagnosis not present

## 2015-06-16 DIAGNOSIS — Z9889 Other specified postprocedural states: Secondary | ICD-10-CM | POA: Diagnosis not present

## 2015-06-16 DIAGNOSIS — R6521 Severe sepsis with septic shock: Secondary | ICD-10-CM | POA: Diagnosis not present

## 2015-06-16 DIAGNOSIS — R262 Difficulty in walking, not elsewhere classified: Secondary | ICD-10-CM | POA: Diagnosis not present

## 2015-06-16 DIAGNOSIS — H353190 Nonexudative age-related macular degeneration, unspecified eye, stage unspecified: Secondary | ICD-10-CM | POA: Diagnosis not present

## 2015-06-16 DIAGNOSIS — Z950 Presence of cardiac pacemaker: Secondary | ICD-10-CM | POA: Diagnosis not present

## 2015-06-16 DIAGNOSIS — I4891 Unspecified atrial fibrillation: Secondary | ICD-10-CM | POA: Diagnosis not present

## 2015-06-16 DIAGNOSIS — R279 Unspecified lack of coordination: Secondary | ICD-10-CM | POA: Diagnosis not present

## 2015-06-16 DIAGNOSIS — Z7901 Long term (current) use of anticoagulants: Secondary | ICD-10-CM | POA: Diagnosis not present

## 2015-06-16 DIAGNOSIS — J449 Chronic obstructive pulmonary disease, unspecified: Secondary | ICD-10-CM | POA: Diagnosis not present

## 2015-06-16 DIAGNOSIS — F319 Bipolar disorder, unspecified: Secondary | ICD-10-CM | POA: Diagnosis not present

## 2015-06-16 DIAGNOSIS — R1311 Dysphagia, oral phase: Secondary | ICD-10-CM | POA: Diagnosis not present

## 2015-06-16 DIAGNOSIS — F339 Major depressive disorder, recurrent, unspecified: Secondary | ICD-10-CM | POA: Diagnosis not present

## 2015-06-16 DIAGNOSIS — R05 Cough: Secondary | ICD-10-CM | POA: Diagnosis not present

## 2015-06-16 DIAGNOSIS — M25461 Effusion, right knee: Secondary | ICD-10-CM | POA: Diagnosis not present

## 2015-06-16 DIAGNOSIS — M1611 Unilateral primary osteoarthritis, right hip: Secondary | ICD-10-CM | POA: Diagnosis not present

## 2015-06-16 DIAGNOSIS — E785 Hyperlipidemia, unspecified: Secondary | ICD-10-CM | POA: Diagnosis not present

## 2015-06-16 DIAGNOSIS — M79601 Pain in right arm: Secondary | ICD-10-CM | POA: Diagnosis not present

## 2015-06-16 DIAGNOSIS — F0390 Unspecified dementia without behavioral disturbance: Secondary | ICD-10-CM | POA: Diagnosis not present

## 2015-06-16 DIAGNOSIS — A419 Sepsis, unspecified organism: Secondary | ICD-10-CM | POA: Diagnosis not present

## 2015-06-16 DIAGNOSIS — J189 Pneumonia, unspecified organism: Secondary | ICD-10-CM | POA: Diagnosis not present

## 2015-06-16 DIAGNOSIS — E119 Type 2 diabetes mellitus without complications: Secondary | ICD-10-CM | POA: Diagnosis not present

## 2015-06-16 DIAGNOSIS — D680 Von Willebrand's disease: Secondary | ICD-10-CM | POA: Diagnosis not present

## 2015-06-16 DIAGNOSIS — J9811 Atelectasis: Secondary | ICD-10-CM | POA: Diagnosis not present

## 2015-06-16 DIAGNOSIS — G9341 Metabolic encephalopathy: Secondary | ICD-10-CM | POA: Diagnosis not present

## 2015-06-17 DIAGNOSIS — Z7901 Long term (current) use of anticoagulants: Secondary | ICD-10-CM | POA: Diagnosis not present

## 2015-06-17 DIAGNOSIS — J189 Pneumonia, unspecified organism: Secondary | ICD-10-CM | POA: Diagnosis not present

## 2015-06-17 DIAGNOSIS — N390 Urinary tract infection, site not specified: Secondary | ICD-10-CM | POA: Diagnosis not present

## 2015-06-17 DIAGNOSIS — I5030 Unspecified diastolic (congestive) heart failure: Secondary | ICD-10-CM | POA: Diagnosis not present

## 2015-06-17 DIAGNOSIS — F0281 Dementia in other diseases classified elsewhere with behavioral disturbance: Secondary | ICD-10-CM | POA: Diagnosis not present

## 2015-06-18 DIAGNOSIS — S0083XA Contusion of other part of head, initial encounter: Secondary | ICD-10-CM | POA: Diagnosis not present

## 2015-06-18 DIAGNOSIS — Z043 Encounter for examination and observation following other accident: Secondary | ICD-10-CM | POA: Diagnosis not present

## 2015-06-18 DIAGNOSIS — I509 Heart failure, unspecified: Secondary | ICD-10-CM | POA: Diagnosis not present

## 2015-06-18 DIAGNOSIS — M25551 Pain in right hip: Secondary | ICD-10-CM | POA: Diagnosis not present

## 2015-06-18 DIAGNOSIS — I10 Essential (primary) hypertension: Secondary | ICD-10-CM | POA: Diagnosis not present

## 2015-06-18 DIAGNOSIS — M79601 Pain in right arm: Secondary | ICD-10-CM | POA: Diagnosis not present

## 2015-06-18 DIAGNOSIS — F0390 Unspecified dementia without behavioral disturbance: Secondary | ICD-10-CM | POA: Diagnosis not present

## 2015-06-18 DIAGNOSIS — I4891 Unspecified atrial fibrillation: Secondary | ICD-10-CM | POA: Diagnosis not present

## 2015-06-18 DIAGNOSIS — I2510 Atherosclerotic heart disease of native coronary artery without angina pectoris: Secondary | ICD-10-CM | POA: Diagnosis not present

## 2015-06-18 DIAGNOSIS — M79604 Pain in right leg: Secondary | ICD-10-CM | POA: Diagnosis not present

## 2015-06-18 DIAGNOSIS — J9811 Atelectasis: Secondary | ICD-10-CM | POA: Diagnosis not present

## 2015-07-01 DIAGNOSIS — M1611 Unilateral primary osteoarthritis, right hip: Secondary | ICD-10-CM | POA: Diagnosis not present

## 2015-07-01 DIAGNOSIS — Z9889 Other specified postprocedural states: Secondary | ICD-10-CM | POA: Diagnosis not present

## 2015-07-01 DIAGNOSIS — M25461 Effusion, right knee: Secondary | ICD-10-CM | POA: Diagnosis not present

## 2015-07-03 DIAGNOSIS — M25461 Effusion, right knee: Secondary | ICD-10-CM | POA: Diagnosis not present

## 2015-07-09 DIAGNOSIS — M25561 Pain in right knee: Secondary | ICD-10-CM | POA: Diagnosis not present

## 2015-07-16 DIAGNOSIS — I10 Essential (primary) hypertension: Secondary | ICD-10-CM | POA: Diagnosis not present

## 2015-07-16 DIAGNOSIS — F0281 Dementia in other diseases classified elsewhere with behavioral disturbance: Secondary | ICD-10-CM | POA: Diagnosis not present

## 2015-07-16 DIAGNOSIS — E119 Type 2 diabetes mellitus without complications: Secondary | ICD-10-CM | POA: Diagnosis not present

## 2015-07-16 DIAGNOSIS — J449 Chronic obstructive pulmonary disease, unspecified: Secondary | ICD-10-CM | POA: Diagnosis not present

## 2015-07-19 DIAGNOSIS — F321 Major depressive disorder, single episode, moderate: Secondary | ICD-10-CM | POA: Diagnosis not present

## 2015-07-19 DIAGNOSIS — F411 Generalized anxiety disorder: Secondary | ICD-10-CM | POA: Diagnosis not present

## 2015-07-19 DIAGNOSIS — F0281 Dementia in other diseases classified elsewhere with behavioral disturbance: Secondary | ICD-10-CM | POA: Diagnosis not present

## 2015-07-27 DIAGNOSIS — N3000 Acute cystitis without hematuria: Secondary | ICD-10-CM | POA: Diagnosis not present

## 2015-07-27 DIAGNOSIS — S72301K Unspecified fracture of shaft of right femur, subsequent encounter for closed fracture with nonunion: Secondary | ICD-10-CM | POA: Diagnosis not present

## 2015-07-27 DIAGNOSIS — E785 Hyperlipidemia, unspecified: Secondary | ICD-10-CM | POA: Diagnosis not present

## 2015-07-27 DIAGNOSIS — I10 Essential (primary) hypertension: Secondary | ICD-10-CM | POA: Diagnosis not present

## 2015-07-27 DIAGNOSIS — M25551 Pain in right hip: Secondary | ICD-10-CM | POA: Diagnosis not present

## 2015-07-27 DIAGNOSIS — K219 Gastro-esophageal reflux disease without esophagitis: Secondary | ICD-10-CM | POA: Diagnosis not present

## 2015-07-27 DIAGNOSIS — R262 Difficulty in walking, not elsewhere classified: Secondary | ICD-10-CM | POA: Diagnosis not present

## 2015-07-27 DIAGNOSIS — F0281 Dementia in other diseases classified elsewhere with behavioral disturbance: Secondary | ICD-10-CM | POA: Diagnosis not present

## 2015-07-27 DIAGNOSIS — H1011 Acute atopic conjunctivitis, right eye: Secondary | ICD-10-CM | POA: Diagnosis not present

## 2015-07-27 DIAGNOSIS — F339 Major depressive disorder, recurrent, unspecified: Secondary | ICD-10-CM | POA: Diagnosis not present

## 2015-07-27 DIAGNOSIS — F0391 Unspecified dementia with behavioral disturbance: Secondary | ICD-10-CM | POA: Diagnosis not present

## 2015-07-27 DIAGNOSIS — M25561 Pain in right knee: Secondary | ICD-10-CM | POA: Diagnosis not present

## 2015-07-27 DIAGNOSIS — N390 Urinary tract infection, site not specified: Secondary | ICD-10-CM | POA: Diagnosis not present

## 2015-07-27 DIAGNOSIS — F419 Anxiety disorder, unspecified: Secondary | ICD-10-CM | POA: Diagnosis not present

## 2015-07-27 DIAGNOSIS — M6281 Muscle weakness (generalized): Secondary | ICD-10-CM | POA: Diagnosis not present

## 2015-07-27 DIAGNOSIS — J449 Chronic obstructive pulmonary disease, unspecified: Secondary | ICD-10-CM | POA: Diagnosis not present

## 2015-07-27 DIAGNOSIS — I509 Heart failure, unspecified: Secondary | ICD-10-CM | POA: Diagnosis not present

## 2015-07-27 DIAGNOSIS — R1311 Dysphagia, oral phase: Secondary | ICD-10-CM | POA: Diagnosis not present

## 2015-07-27 DIAGNOSIS — H353190 Nonexudative age-related macular degeneration, unspecified eye, stage unspecified: Secondary | ICD-10-CM | POA: Diagnosis not present

## 2015-07-27 DIAGNOSIS — I480 Paroxysmal atrial fibrillation: Secondary | ICD-10-CM | POA: Diagnosis not present

## 2015-07-27 DIAGNOSIS — J188 Other pneumonia, unspecified organism: Secondary | ICD-10-CM | POA: Diagnosis not present

## 2015-07-27 DIAGNOSIS — M25461 Effusion, right knee: Secondary | ICD-10-CM | POA: Diagnosis not present

## 2015-07-27 DIAGNOSIS — E119 Type 2 diabetes mellitus without complications: Secondary | ICD-10-CM | POA: Diagnosis not present

## 2015-07-27 DIAGNOSIS — R634 Abnormal weight loss: Secondary | ICD-10-CM | POA: Diagnosis not present

## 2015-07-27 DIAGNOSIS — E039 Hypothyroidism, unspecified: Secondary | ICD-10-CM | POA: Diagnosis not present

## 2015-07-27 DIAGNOSIS — R279 Unspecified lack of coordination: Secondary | ICD-10-CM | POA: Diagnosis not present

## 2015-08-17 DIAGNOSIS — F0281 Dementia in other diseases classified elsewhere with behavioral disturbance: Secondary | ICD-10-CM | POA: Diagnosis not present

## 2015-08-17 DIAGNOSIS — I10 Essential (primary) hypertension: Secondary | ICD-10-CM | POA: Diagnosis not present

## 2015-08-17 DIAGNOSIS — M25551 Pain in right hip: Secondary | ICD-10-CM | POA: Diagnosis not present

## 2015-08-27 DIAGNOSIS — I10 Essential (primary) hypertension: Secondary | ICD-10-CM | POA: Diagnosis not present

## 2015-08-27 DIAGNOSIS — R296 Repeated falls: Secondary | ICD-10-CM | POA: Diagnosis not present

## 2015-08-27 DIAGNOSIS — R262 Difficulty in walking, not elsewhere classified: Secondary | ICD-10-CM | POA: Diagnosis not present

## 2015-08-27 DIAGNOSIS — S41111A Laceration without foreign body of right upper arm, initial encounter: Secondary | ICD-10-CM | POA: Diagnosis not present

## 2015-08-28 DIAGNOSIS — Z794 Long term (current) use of insulin: Secondary | ICD-10-CM | POA: Diagnosis not present

## 2015-08-28 DIAGNOSIS — E7800 Pure hypercholesterolemia, unspecified: Secondary | ICD-10-CM | POA: Diagnosis not present

## 2015-08-28 DIAGNOSIS — I4891 Unspecified atrial fibrillation: Secondary | ICD-10-CM | POA: Diagnosis not present

## 2015-08-28 DIAGNOSIS — S0990XA Unspecified injury of head, initial encounter: Secondary | ICD-10-CM | POA: Diagnosis not present

## 2015-08-28 DIAGNOSIS — J449 Chronic obstructive pulmonary disease, unspecified: Secondary | ICD-10-CM | POA: Diagnosis not present

## 2015-08-28 DIAGNOSIS — K219 Gastro-esophageal reflux disease without esophagitis: Secondary | ICD-10-CM | POA: Diagnosis not present

## 2015-08-28 DIAGNOSIS — Z79899 Other long term (current) drug therapy: Secondary | ICD-10-CM | POA: Diagnosis not present

## 2015-08-28 DIAGNOSIS — R259 Unspecified abnormal involuntary movements: Secondary | ICD-10-CM | POA: Diagnosis not present

## 2015-08-28 DIAGNOSIS — E039 Hypothyroidism, unspecified: Secondary | ICD-10-CM | POA: Diagnosis not present

## 2015-08-28 DIAGNOSIS — F0390 Unspecified dementia without behavioral disturbance: Secondary | ICD-10-CM | POA: Diagnosis not present

## 2015-08-28 DIAGNOSIS — S0081XA Abrasion of other part of head, initial encounter: Secondary | ICD-10-CM | POA: Diagnosis not present

## 2015-08-28 DIAGNOSIS — Z7901 Long term (current) use of anticoagulants: Secondary | ICD-10-CM | POA: Diagnosis not present

## 2015-08-28 DIAGNOSIS — S0083XA Contusion of other part of head, initial encounter: Secondary | ICD-10-CM | POA: Diagnosis not present

## 2015-08-28 DIAGNOSIS — I110 Hypertensive heart disease with heart failure: Secondary | ICD-10-CM | POA: Diagnosis not present

## 2015-08-28 DIAGNOSIS — S199XXA Unspecified injury of neck, initial encounter: Secondary | ICD-10-CM | POA: Diagnosis not present

## 2015-08-28 DIAGNOSIS — Z950 Presence of cardiac pacemaker: Secondary | ICD-10-CM | POA: Diagnosis not present

## 2015-08-28 DIAGNOSIS — I509 Heart failure, unspecified: Secondary | ICD-10-CM | POA: Diagnosis not present

## 2015-08-28 DIAGNOSIS — E119 Type 2 diabetes mellitus without complications: Secondary | ICD-10-CM | POA: Diagnosis not present

## 2015-08-29 DIAGNOSIS — S0083XA Contusion of other part of head, initial encounter: Secondary | ICD-10-CM | POA: Diagnosis not present

## 2015-08-29 DIAGNOSIS — R262 Difficulty in walking, not elsewhere classified: Secondary | ICD-10-CM | POA: Diagnosis not present

## 2015-08-29 DIAGNOSIS — I10 Essential (primary) hypertension: Secondary | ICD-10-CM | POA: Diagnosis not present

## 2015-08-29 DIAGNOSIS — R296 Repeated falls: Secondary | ICD-10-CM | POA: Diagnosis not present

## 2015-09-02 DIAGNOSIS — R279 Unspecified lack of coordination: Secondary | ICD-10-CM | POA: Diagnosis not present

## 2015-09-02 DIAGNOSIS — R262 Difficulty in walking, not elsewhere classified: Secondary | ICD-10-CM | POA: Diagnosis not present

## 2015-09-02 DIAGNOSIS — N3000 Acute cystitis without hematuria: Secondary | ICD-10-CM | POA: Diagnosis not present

## 2015-09-02 DIAGNOSIS — R634 Abnormal weight loss: Secondary | ICD-10-CM | POA: Diagnosis not present

## 2015-09-02 DIAGNOSIS — I480 Paroxysmal atrial fibrillation: Secondary | ICD-10-CM | POA: Diagnosis not present

## 2015-09-02 DIAGNOSIS — M25561 Pain in right knee: Secondary | ICD-10-CM | POA: Diagnosis not present

## 2015-09-02 DIAGNOSIS — Z9181 History of falling: Secondary | ICD-10-CM | POA: Diagnosis not present

## 2015-09-02 DIAGNOSIS — E039 Hypothyroidism, unspecified: Secondary | ICD-10-CM | POA: Diagnosis not present

## 2015-09-02 DIAGNOSIS — E785 Hyperlipidemia, unspecified: Secondary | ICD-10-CM | POA: Diagnosis not present

## 2015-09-02 DIAGNOSIS — F0391 Unspecified dementia with behavioral disturbance: Secondary | ICD-10-CM | POA: Diagnosis not present

## 2015-09-02 DIAGNOSIS — R1311 Dysphagia, oral phase: Secondary | ICD-10-CM | POA: Diagnosis not present

## 2015-09-02 DIAGNOSIS — I509 Heart failure, unspecified: Secondary | ICD-10-CM | POA: Diagnosis not present

## 2015-09-02 DIAGNOSIS — H1011 Acute atopic conjunctivitis, right eye: Secondary | ICD-10-CM | POA: Diagnosis not present

## 2015-09-02 DIAGNOSIS — R296 Repeated falls: Secondary | ICD-10-CM | POA: Diagnosis not present

## 2015-09-02 DIAGNOSIS — E119 Type 2 diabetes mellitus without complications: Secondary | ICD-10-CM | POA: Diagnosis not present

## 2015-09-02 DIAGNOSIS — F339 Major depressive disorder, recurrent, unspecified: Secondary | ICD-10-CM | POA: Diagnosis not present

## 2015-09-02 DIAGNOSIS — N390 Urinary tract infection, site not specified: Secondary | ICD-10-CM | POA: Diagnosis not present

## 2015-09-02 DIAGNOSIS — K219 Gastro-esophageal reflux disease without esophagitis: Secondary | ICD-10-CM | POA: Diagnosis not present

## 2015-09-02 DIAGNOSIS — J449 Chronic obstructive pulmonary disease, unspecified: Secondary | ICD-10-CM | POA: Diagnosis not present

## 2015-09-02 DIAGNOSIS — F419 Anxiety disorder, unspecified: Secondary | ICD-10-CM | POA: Diagnosis not present

## 2015-09-02 DIAGNOSIS — S72301K Unspecified fracture of shaft of right femur, subsequent encounter for closed fracture with nonunion: Secondary | ICD-10-CM | POA: Diagnosis not present

## 2015-09-02 DIAGNOSIS — H353190 Nonexudative age-related macular degeneration, unspecified eye, stage unspecified: Secondary | ICD-10-CM | POA: Diagnosis not present

## 2015-09-02 DIAGNOSIS — I10 Essential (primary) hypertension: Secondary | ICD-10-CM | POA: Diagnosis not present

## 2015-09-02 DIAGNOSIS — S0003XA Contusion of scalp, initial encounter: Secondary | ICD-10-CM | POA: Diagnosis not present

## 2015-09-02 DIAGNOSIS — M6281 Muscle weakness (generalized): Secondary | ICD-10-CM | POA: Diagnosis not present

## 2015-09-02 DIAGNOSIS — J188 Other pneumonia, unspecified organism: Secondary | ICD-10-CM | POA: Diagnosis not present

## 2015-09-23 DIAGNOSIS — F0281 Dementia in other diseases classified elsewhere with behavioral disturbance: Secondary | ICD-10-CM | POA: Diagnosis not present

## 2015-09-23 DIAGNOSIS — M25551 Pain in right hip: Secondary | ICD-10-CM | POA: Diagnosis not present

## 2015-09-23 DIAGNOSIS — I10 Essential (primary) hypertension: Secondary | ICD-10-CM | POA: Diagnosis not present

## 2015-09-27 DIAGNOSIS — I4891 Unspecified atrial fibrillation: Secondary | ICD-10-CM | POA: Diagnosis not present

## 2015-09-27 DIAGNOSIS — I480 Paroxysmal atrial fibrillation: Secondary | ICD-10-CM | POA: Diagnosis not present

## 2015-10-04 DIAGNOSIS — F411 Generalized anxiety disorder: Secondary | ICD-10-CM | POA: Diagnosis not present

## 2015-10-04 DIAGNOSIS — F0281 Dementia in other diseases classified elsewhere with behavioral disturbance: Secondary | ICD-10-CM | POA: Diagnosis not present

## 2015-10-04 DIAGNOSIS — I4891 Unspecified atrial fibrillation: Secondary | ICD-10-CM | POA: Diagnosis not present

## 2015-10-04 DIAGNOSIS — F321 Major depressive disorder, single episode, moderate: Secondary | ICD-10-CM | POA: Diagnosis not present

## 2015-10-04 DIAGNOSIS — I480 Paroxysmal atrial fibrillation: Secondary | ICD-10-CM | POA: Diagnosis not present

## 2015-10-07 DIAGNOSIS — E785 Hyperlipidemia, unspecified: Secondary | ICD-10-CM | POA: Diagnosis not present

## 2015-10-07 DIAGNOSIS — E109 Type 1 diabetes mellitus without complications: Secondary | ICD-10-CM | POA: Diagnosis not present

## 2015-10-11 DIAGNOSIS — I480 Paroxysmal atrial fibrillation: Secondary | ICD-10-CM | POA: Diagnosis not present

## 2015-10-11 DIAGNOSIS — I4891 Unspecified atrial fibrillation: Secondary | ICD-10-CM | POA: Diagnosis not present

## 2015-10-12 DIAGNOSIS — E119 Type 2 diabetes mellitus without complications: Secondary | ICD-10-CM | POA: Diagnosis not present

## 2015-10-12 DIAGNOSIS — E038 Other specified hypothyroidism: Secondary | ICD-10-CM | POA: Diagnosis not present

## 2015-10-12 DIAGNOSIS — I480 Paroxysmal atrial fibrillation: Secondary | ICD-10-CM | POA: Diagnosis not present

## 2015-10-12 DIAGNOSIS — J449 Chronic obstructive pulmonary disease, unspecified: Secondary | ICD-10-CM | POA: Diagnosis not present

## 2015-10-16 DIAGNOSIS — M79642 Pain in left hand: Secondary | ICD-10-CM | POA: Diagnosis not present

## 2015-10-18 DIAGNOSIS — M1612 Unilateral primary osteoarthritis, left hip: Secondary | ICD-10-CM | POA: Diagnosis not present

## 2015-10-18 DIAGNOSIS — F0281 Dementia in other diseases classified elsewhere with behavioral disturbance: Secondary | ICD-10-CM | POA: Diagnosis not present

## 2015-10-18 DIAGNOSIS — I10 Essential (primary) hypertension: Secondary | ICD-10-CM | POA: Diagnosis not present

## 2015-10-18 DIAGNOSIS — R2232 Localized swelling, mass and lump, left upper limb: Secondary | ICD-10-CM | POA: Diagnosis not present

## 2015-10-18 DIAGNOSIS — M79652 Pain in left thigh: Secondary | ICD-10-CM | POA: Diagnosis not present

## 2015-10-18 DIAGNOSIS — M25551 Pain in right hip: Secondary | ICD-10-CM | POA: Diagnosis not present

## 2015-10-19 DIAGNOSIS — Z79899 Other long term (current) drug therapy: Secondary | ICD-10-CM | POA: Diagnosis not present

## 2015-10-19 DIAGNOSIS — N390 Urinary tract infection, site not specified: Secondary | ICD-10-CM | POA: Diagnosis not present

## 2015-10-19 DIAGNOSIS — R319 Hematuria, unspecified: Secondary | ICD-10-CM | POA: Diagnosis not present

## 2015-10-21 DIAGNOSIS — Z79899 Other long term (current) drug therapy: Secondary | ICD-10-CM | POA: Diagnosis not present

## 2015-10-21 DIAGNOSIS — E039 Hypothyroidism, unspecified: Secondary | ICD-10-CM | POA: Diagnosis not present

## 2015-10-21 DIAGNOSIS — R262 Difficulty in walking, not elsewhere classified: Secondary | ICD-10-CM | POA: Diagnosis not present

## 2015-10-21 DIAGNOSIS — D649 Anemia, unspecified: Secondary | ICD-10-CM | POA: Diagnosis not present

## 2015-10-21 DIAGNOSIS — E119 Type 2 diabetes mellitus without complications: Secondary | ICD-10-CM | POA: Diagnosis not present

## 2015-10-21 DIAGNOSIS — E612 Magnesium deficiency: Secondary | ICD-10-CM | POA: Diagnosis not present

## 2015-10-21 DIAGNOSIS — I480 Paroxysmal atrial fibrillation: Secondary | ICD-10-CM | POA: Diagnosis not present

## 2015-10-22 DIAGNOSIS — R262 Difficulty in walking, not elsewhere classified: Secondary | ICD-10-CM | POA: Diagnosis not present

## 2015-10-22 DIAGNOSIS — I480 Paroxysmal atrial fibrillation: Secondary | ICD-10-CM | POA: Diagnosis not present

## 2015-10-23 DIAGNOSIS — R262 Difficulty in walking, not elsewhere classified: Secondary | ICD-10-CM | POA: Diagnosis not present

## 2015-10-23 DIAGNOSIS — I480 Paroxysmal atrial fibrillation: Secondary | ICD-10-CM | POA: Diagnosis not present

## 2015-10-24 DIAGNOSIS — R262 Difficulty in walking, not elsewhere classified: Secondary | ICD-10-CM | POA: Diagnosis not present

## 2015-10-24 DIAGNOSIS — I480 Paroxysmal atrial fibrillation: Secondary | ICD-10-CM | POA: Diagnosis not present

## 2015-10-25 DIAGNOSIS — I480 Paroxysmal atrial fibrillation: Secondary | ICD-10-CM | POA: Diagnosis not present

## 2015-10-25 DIAGNOSIS — I4891 Unspecified atrial fibrillation: Secondary | ICD-10-CM | POA: Diagnosis not present

## 2015-10-25 DIAGNOSIS — I482 Chronic atrial fibrillation: Secondary | ICD-10-CM | POA: Diagnosis not present

## 2015-10-25 DIAGNOSIS — R262 Difficulty in walking, not elsewhere classified: Secondary | ICD-10-CM | POA: Diagnosis not present

## 2015-10-27 DIAGNOSIS — R51 Headache: Secondary | ICD-10-CM | POA: Diagnosis not present

## 2015-10-27 DIAGNOSIS — M25559 Pain in unspecified hip: Secondary | ICD-10-CM | POA: Diagnosis not present

## 2015-10-27 DIAGNOSIS — S3993XA Unspecified injury of pelvis, initial encounter: Secondary | ICD-10-CM | POA: Diagnosis not present

## 2015-10-27 DIAGNOSIS — M25552 Pain in left hip: Secondary | ICD-10-CM | POA: Diagnosis not present

## 2015-10-27 DIAGNOSIS — S8012XA Contusion of left lower leg, initial encounter: Secondary | ICD-10-CM | POA: Diagnosis not present

## 2015-10-27 DIAGNOSIS — S299XXA Unspecified injury of thorax, initial encounter: Secondary | ICD-10-CM | POA: Diagnosis not present

## 2015-10-27 DIAGNOSIS — R259 Unspecified abnormal involuntary movements: Secondary | ICD-10-CM | POA: Diagnosis not present

## 2015-10-27 DIAGNOSIS — S7012XA Contusion of left thigh, initial encounter: Secondary | ICD-10-CM | POA: Diagnosis not present

## 2015-10-27 DIAGNOSIS — M25521 Pain in right elbow: Secondary | ICD-10-CM | POA: Diagnosis not present

## 2015-10-27 DIAGNOSIS — S199XXA Unspecified injury of neck, initial encounter: Secondary | ICD-10-CM | POA: Diagnosis not present

## 2015-10-27 DIAGNOSIS — S300XXA Contusion of lower back and pelvis, initial encounter: Secondary | ICD-10-CM | POA: Diagnosis not present

## 2015-10-27 DIAGNOSIS — Z9181 History of falling: Secondary | ICD-10-CM | POA: Diagnosis not present

## 2015-10-27 DIAGNOSIS — R109 Unspecified abdominal pain: Secondary | ICD-10-CM | POA: Diagnosis not present

## 2015-10-27 DIAGNOSIS — S0990XA Unspecified injury of head, initial encounter: Secondary | ICD-10-CM | POA: Diagnosis not present

## 2015-10-27 DIAGNOSIS — S3991XA Unspecified injury of abdomen, initial encounter: Secondary | ICD-10-CM | POA: Diagnosis not present

## 2015-10-27 DIAGNOSIS — E869 Volume depletion, unspecified: Secondary | ICD-10-CM | POA: Diagnosis not present

## 2015-10-27 DIAGNOSIS — F0390 Unspecified dementia without behavioral disturbance: Secondary | ICD-10-CM | POA: Diagnosis not present

## 2015-10-28 DIAGNOSIS — F0281 Dementia in other diseases classified elsewhere with behavioral disturbance: Secondary | ICD-10-CM | POA: Diagnosis not present

## 2015-10-28 DIAGNOSIS — S72301K Unspecified fracture of shaft of right femur, subsequent encounter for closed fracture with nonunion: Secondary | ICD-10-CM | POA: Diagnosis not present

## 2015-10-28 DIAGNOSIS — M7989 Other specified soft tissue disorders: Secondary | ICD-10-CM | POA: Diagnosis not present

## 2015-10-28 DIAGNOSIS — R2232 Localized swelling, mass and lump, left upper limb: Secondary | ICD-10-CM | POA: Diagnosis not present

## 2015-10-28 DIAGNOSIS — R262 Difficulty in walking, not elsewhere classified: Secondary | ICD-10-CM | POA: Diagnosis not present

## 2015-10-28 DIAGNOSIS — R296 Repeated falls: Secondary | ICD-10-CM | POA: Diagnosis not present

## 2015-10-28 DIAGNOSIS — I4891 Unspecified atrial fibrillation: Secondary | ICD-10-CM | POA: Diagnosis not present

## 2015-10-28 DIAGNOSIS — M25559 Pain in unspecified hip: Secondary | ICD-10-CM | POA: Diagnosis not present

## 2015-10-28 DIAGNOSIS — R259 Unspecified abnormal involuntary movements: Secondary | ICD-10-CM | POA: Diagnosis not present

## 2015-10-29 DIAGNOSIS — S72301K Unspecified fracture of shaft of right femur, subsequent encounter for closed fracture with nonunion: Secondary | ICD-10-CM | POA: Diagnosis not present

## 2015-10-29 DIAGNOSIS — R262 Difficulty in walking, not elsewhere classified: Secondary | ICD-10-CM | POA: Diagnosis not present

## 2015-10-30 DIAGNOSIS — R262 Difficulty in walking, not elsewhere classified: Secondary | ICD-10-CM | POA: Diagnosis not present

## 2015-10-30 DIAGNOSIS — S72301K Unspecified fracture of shaft of right femur, subsequent encounter for closed fracture with nonunion: Secondary | ICD-10-CM | POA: Diagnosis not present

## 2015-10-31 DIAGNOSIS — R262 Difficulty in walking, not elsewhere classified: Secondary | ICD-10-CM | POA: Diagnosis not present

## 2015-10-31 DIAGNOSIS — S72301K Unspecified fracture of shaft of right femur, subsequent encounter for closed fracture with nonunion: Secondary | ICD-10-CM | POA: Diagnosis not present

## 2015-11-01 DIAGNOSIS — R262 Difficulty in walking, not elsewhere classified: Secondary | ICD-10-CM | POA: Diagnosis not present

## 2015-11-01 DIAGNOSIS — R2242 Localized swelling, mass and lump, left lower limb: Secondary | ICD-10-CM | POA: Diagnosis not present

## 2015-11-01 DIAGNOSIS — I4891 Unspecified atrial fibrillation: Secondary | ICD-10-CM | POA: Diagnosis not present

## 2015-11-01 DIAGNOSIS — S72301K Unspecified fracture of shaft of right femur, subsequent encounter for closed fracture with nonunion: Secondary | ICD-10-CM | POA: Diagnosis not present

## 2015-11-03 DIAGNOSIS — R262 Difficulty in walking, not elsewhere classified: Secondary | ICD-10-CM | POA: Diagnosis not present

## 2015-11-03 DIAGNOSIS — S72301K Unspecified fracture of shaft of right femur, subsequent encounter for closed fracture with nonunion: Secondary | ICD-10-CM | POA: Diagnosis not present

## 2015-11-04 DIAGNOSIS — S72301K Unspecified fracture of shaft of right femur, subsequent encounter for closed fracture with nonunion: Secondary | ICD-10-CM | POA: Diagnosis not present

## 2015-11-04 DIAGNOSIS — R262 Difficulty in walking, not elsewhere classified: Secondary | ICD-10-CM | POA: Diagnosis not present

## 2015-11-05 DIAGNOSIS — R262 Difficulty in walking, not elsewhere classified: Secondary | ICD-10-CM | POA: Diagnosis not present

## 2015-11-05 DIAGNOSIS — S72301K Unspecified fracture of shaft of right femur, subsequent encounter for closed fracture with nonunion: Secondary | ICD-10-CM | POA: Diagnosis not present

## 2015-11-06 DIAGNOSIS — I4891 Unspecified atrial fibrillation: Secondary | ICD-10-CM | POA: Diagnosis not present

## 2015-11-06 DIAGNOSIS — I482 Chronic atrial fibrillation: Secondary | ICD-10-CM | POA: Diagnosis not present

## 2015-11-11 DIAGNOSIS — R296 Repeated falls: Secondary | ICD-10-CM | POA: Diagnosis not present

## 2015-11-11 DIAGNOSIS — F0281 Dementia in other diseases classified elsewhere with behavioral disturbance: Secondary | ICD-10-CM | POA: Diagnosis not present

## 2015-11-12 DIAGNOSIS — E039 Hypothyroidism, unspecified: Secondary | ICD-10-CM | POA: Diagnosis not present

## 2015-11-12 DIAGNOSIS — Z0000 Encounter for general adult medical examination without abnormal findings: Secondary | ICD-10-CM | POA: Diagnosis not present

## 2015-11-12 DIAGNOSIS — F0281 Dementia in other diseases classified elsewhere with behavioral disturbance: Secondary | ICD-10-CM | POA: Diagnosis not present

## 2015-11-12 DIAGNOSIS — J449 Chronic obstructive pulmonary disease, unspecified: Secondary | ICD-10-CM | POA: Diagnosis not present

## 2015-11-12 DIAGNOSIS — E119 Type 2 diabetes mellitus without complications: Secondary | ICD-10-CM | POA: Diagnosis not present

## 2015-11-13 DIAGNOSIS — S72301K Unspecified fracture of shaft of right femur, subsequent encounter for closed fracture with nonunion: Secondary | ICD-10-CM | POA: Diagnosis not present

## 2015-11-13 DIAGNOSIS — I482 Chronic atrial fibrillation: Secondary | ICD-10-CM | POA: Diagnosis not present

## 2015-11-13 DIAGNOSIS — I4891 Unspecified atrial fibrillation: Secondary | ICD-10-CM | POA: Diagnosis not present

## 2015-11-13 DIAGNOSIS — I480 Paroxysmal atrial fibrillation: Secondary | ICD-10-CM | POA: Diagnosis not present

## 2015-11-13 DIAGNOSIS — Z7901 Long term (current) use of anticoagulants: Secondary | ICD-10-CM | POA: Diagnosis not present

## 2015-11-13 DIAGNOSIS — R262 Difficulty in walking, not elsewhere classified: Secondary | ICD-10-CM | POA: Diagnosis not present

## 2015-11-14 DIAGNOSIS — S72301K Unspecified fracture of shaft of right femur, subsequent encounter for closed fracture with nonunion: Secondary | ICD-10-CM | POA: Diagnosis not present

## 2015-11-14 DIAGNOSIS — Z7901 Long term (current) use of anticoagulants: Secondary | ICD-10-CM | POA: Diagnosis not present

## 2015-11-14 DIAGNOSIS — I480 Paroxysmal atrial fibrillation: Secondary | ICD-10-CM | POA: Diagnosis not present

## 2015-11-14 DIAGNOSIS — R262 Difficulty in walking, not elsewhere classified: Secondary | ICD-10-CM | POA: Diagnosis not present

## 2015-11-15 DIAGNOSIS — E1142 Type 2 diabetes mellitus with diabetic polyneuropathy: Secondary | ICD-10-CM | POA: Diagnosis not present

## 2015-11-15 DIAGNOSIS — I482 Chronic atrial fibrillation: Secondary | ICD-10-CM | POA: Diagnosis not present

## 2015-11-15 DIAGNOSIS — I739 Peripheral vascular disease, unspecified: Secondary | ICD-10-CM | POA: Diagnosis not present

## 2015-11-15 DIAGNOSIS — R262 Difficulty in walking, not elsewhere classified: Secondary | ICD-10-CM | POA: Diagnosis not present

## 2015-11-15 DIAGNOSIS — I4891 Unspecified atrial fibrillation: Secondary | ICD-10-CM | POA: Diagnosis not present

## 2015-11-15 DIAGNOSIS — B351 Tinea unguium: Secondary | ICD-10-CM | POA: Diagnosis not present

## 2015-11-16 DIAGNOSIS — R262 Difficulty in walking, not elsewhere classified: Secondary | ICD-10-CM | POA: Diagnosis not present

## 2015-11-16 DIAGNOSIS — S72301K Unspecified fracture of shaft of right femur, subsequent encounter for closed fracture with nonunion: Secondary | ICD-10-CM | POA: Diagnosis not present

## 2015-11-18 DIAGNOSIS — I10 Essential (primary) hypertension: Secondary | ICD-10-CM | POA: Diagnosis not present

## 2015-11-18 DIAGNOSIS — S72301K Unspecified fracture of shaft of right femur, subsequent encounter for closed fracture with nonunion: Secondary | ICD-10-CM | POA: Diagnosis not present

## 2015-11-18 DIAGNOSIS — E039 Hypothyroidism, unspecified: Secondary | ICD-10-CM | POA: Diagnosis not present

## 2015-11-18 DIAGNOSIS — J449 Chronic obstructive pulmonary disease, unspecified: Secondary | ICD-10-CM | POA: Diagnosis not present

## 2015-11-18 DIAGNOSIS — F0281 Dementia in other diseases classified elsewhere with behavioral disturbance: Secondary | ICD-10-CM | POA: Diagnosis not present

## 2015-11-18 DIAGNOSIS — R262 Difficulty in walking, not elsewhere classified: Secondary | ICD-10-CM | POA: Diagnosis not present

## 2015-11-19 DIAGNOSIS — S72301K Unspecified fracture of shaft of right femur, subsequent encounter for closed fracture with nonunion: Secondary | ICD-10-CM | POA: Diagnosis not present

## 2015-11-19 DIAGNOSIS — R262 Difficulty in walking, not elsewhere classified: Secondary | ICD-10-CM | POA: Diagnosis not present

## 2015-11-20 DIAGNOSIS — S72301K Unspecified fracture of shaft of right femur, subsequent encounter for closed fracture with nonunion: Secondary | ICD-10-CM | POA: Diagnosis not present

## 2015-11-20 DIAGNOSIS — R262 Difficulty in walking, not elsewhere classified: Secondary | ICD-10-CM | POA: Diagnosis not present

## 2015-11-21 DIAGNOSIS — R262 Difficulty in walking, not elsewhere classified: Secondary | ICD-10-CM | POA: Diagnosis not present

## 2015-11-21 DIAGNOSIS — S72301K Unspecified fracture of shaft of right femur, subsequent encounter for closed fracture with nonunion: Secondary | ICD-10-CM | POA: Diagnosis not present

## 2015-11-22 DIAGNOSIS — I4891 Unspecified atrial fibrillation: Secondary | ICD-10-CM | POA: Diagnosis not present

## 2015-11-22 DIAGNOSIS — I482 Chronic atrial fibrillation: Secondary | ICD-10-CM | POA: Diagnosis not present

## 2015-11-23 DIAGNOSIS — S72301K Unspecified fracture of shaft of right femur, subsequent encounter for closed fracture with nonunion: Secondary | ICD-10-CM | POA: Diagnosis not present

## 2015-11-23 DIAGNOSIS — R262 Difficulty in walking, not elsewhere classified: Secondary | ICD-10-CM | POA: Diagnosis not present

## 2015-11-25 DIAGNOSIS — R262 Difficulty in walking, not elsewhere classified: Secondary | ICD-10-CM | POA: Diagnosis not present

## 2015-11-25 DIAGNOSIS — S72301K Unspecified fracture of shaft of right femur, subsequent encounter for closed fracture with nonunion: Secondary | ICD-10-CM | POA: Diagnosis not present

## 2015-11-26 DIAGNOSIS — S72301K Unspecified fracture of shaft of right femur, subsequent encounter for closed fracture with nonunion: Secondary | ICD-10-CM | POA: Diagnosis not present

## 2015-11-26 DIAGNOSIS — R262 Difficulty in walking, not elsewhere classified: Secondary | ICD-10-CM | POA: Diagnosis not present

## 2015-11-27 DIAGNOSIS — S72301K Unspecified fracture of shaft of right femur, subsequent encounter for closed fracture with nonunion: Secondary | ICD-10-CM | POA: Diagnosis not present

## 2015-11-27 DIAGNOSIS — R262 Difficulty in walking, not elsewhere classified: Secondary | ICD-10-CM | POA: Diagnosis not present

## 2015-11-28 DIAGNOSIS — R262 Difficulty in walking, not elsewhere classified: Secondary | ICD-10-CM | POA: Diagnosis not present

## 2015-11-28 DIAGNOSIS — S72301K Unspecified fracture of shaft of right femur, subsequent encounter for closed fracture with nonunion: Secondary | ICD-10-CM | POA: Diagnosis not present

## 2015-11-29 DIAGNOSIS — I4891 Unspecified atrial fibrillation: Secondary | ICD-10-CM | POA: Diagnosis not present

## 2015-11-29 DIAGNOSIS — I482 Chronic atrial fibrillation: Secondary | ICD-10-CM | POA: Diagnosis not present

## 2015-11-30 DIAGNOSIS — S72301K Unspecified fracture of shaft of right femur, subsequent encounter for closed fracture with nonunion: Secondary | ICD-10-CM | POA: Diagnosis not present

## 2015-11-30 DIAGNOSIS — R262 Difficulty in walking, not elsewhere classified: Secondary | ICD-10-CM | POA: Diagnosis not present

## 2015-12-02 DIAGNOSIS — I482 Chronic atrial fibrillation: Secondary | ICD-10-CM | POA: Diagnosis not present

## 2015-12-02 DIAGNOSIS — S72301K Unspecified fracture of shaft of right femur, subsequent encounter for closed fracture with nonunion: Secondary | ICD-10-CM | POA: Diagnosis not present

## 2015-12-02 DIAGNOSIS — R262 Difficulty in walking, not elsewhere classified: Secondary | ICD-10-CM | POA: Diagnosis not present

## 2015-12-03 DIAGNOSIS — S72301K Unspecified fracture of shaft of right femur, subsequent encounter for closed fracture with nonunion: Secondary | ICD-10-CM | POA: Diagnosis not present

## 2015-12-03 DIAGNOSIS — R262 Difficulty in walking, not elsewhere classified: Secondary | ICD-10-CM | POA: Diagnosis not present

## 2015-12-04 DIAGNOSIS — R262 Difficulty in walking, not elsewhere classified: Secondary | ICD-10-CM | POA: Diagnosis not present

## 2015-12-04 DIAGNOSIS — S72301K Unspecified fracture of shaft of right femur, subsequent encounter for closed fracture with nonunion: Secondary | ICD-10-CM | POA: Diagnosis not present

## 2015-12-05 DIAGNOSIS — I482 Chronic atrial fibrillation: Secondary | ICD-10-CM | POA: Diagnosis not present

## 2015-12-05 DIAGNOSIS — R262 Difficulty in walking, not elsewhere classified: Secondary | ICD-10-CM | POA: Diagnosis not present

## 2015-12-05 DIAGNOSIS — I4891 Unspecified atrial fibrillation: Secondary | ICD-10-CM | POA: Diagnosis not present

## 2015-12-05 DIAGNOSIS — S72301K Unspecified fracture of shaft of right femur, subsequent encounter for closed fracture with nonunion: Secondary | ICD-10-CM | POA: Diagnosis not present

## 2015-12-06 DIAGNOSIS — R262 Difficulty in walking, not elsewhere classified: Secondary | ICD-10-CM | POA: Diagnosis not present

## 2015-12-06 DIAGNOSIS — S72301K Unspecified fracture of shaft of right femur, subsequent encounter for closed fracture with nonunion: Secondary | ICD-10-CM | POA: Diagnosis not present

## 2015-12-07 DIAGNOSIS — I482 Chronic atrial fibrillation: Secondary | ICD-10-CM | POA: Diagnosis not present

## 2015-12-07 DIAGNOSIS — I4891 Unspecified atrial fibrillation: Secondary | ICD-10-CM | POA: Diagnosis not present

## 2015-12-08 DIAGNOSIS — S72301K Unspecified fracture of shaft of right femur, subsequent encounter for closed fracture with nonunion: Secondary | ICD-10-CM | POA: Diagnosis not present

## 2015-12-08 DIAGNOSIS — R262 Difficulty in walking, not elsewhere classified: Secondary | ICD-10-CM | POA: Diagnosis not present

## 2015-12-09 DIAGNOSIS — Z7901 Long term (current) use of anticoagulants: Secondary | ICD-10-CM | POA: Diagnosis not present

## 2015-12-09 DIAGNOSIS — R262 Difficulty in walking, not elsewhere classified: Secondary | ICD-10-CM | POA: Diagnosis not present

## 2015-12-09 DIAGNOSIS — S72301K Unspecified fracture of shaft of right femur, subsequent encounter for closed fracture with nonunion: Secondary | ICD-10-CM | POA: Diagnosis not present

## 2015-12-12 DIAGNOSIS — I482 Chronic atrial fibrillation: Secondary | ICD-10-CM | POA: Diagnosis not present

## 2015-12-12 DIAGNOSIS — I4891 Unspecified atrial fibrillation: Secondary | ICD-10-CM | POA: Diagnosis not present

## 2015-12-15 DIAGNOSIS — D649 Anemia, unspecified: Secondary | ICD-10-CM | POA: Diagnosis not present

## 2015-12-15 DIAGNOSIS — I10 Essential (primary) hypertension: Secondary | ICD-10-CM | POA: Diagnosis not present

## 2015-12-15 DIAGNOSIS — R262 Difficulty in walking, not elsewhere classified: Secondary | ICD-10-CM | POA: Diagnosis not present

## 2015-12-15 DIAGNOSIS — E039 Hypothyroidism, unspecified: Secondary | ICD-10-CM | POA: Diagnosis not present

## 2015-12-17 DIAGNOSIS — F0281 Dementia in other diseases classified elsewhere with behavioral disturbance: Secondary | ICD-10-CM | POA: Diagnosis not present

## 2015-12-17 DIAGNOSIS — E039 Hypothyroidism, unspecified: Secondary | ICD-10-CM | POA: Diagnosis not present

## 2015-12-17 DIAGNOSIS — J449 Chronic obstructive pulmonary disease, unspecified: Secondary | ICD-10-CM | POA: Diagnosis not present

## 2015-12-17 DIAGNOSIS — I10 Essential (primary) hypertension: Secondary | ICD-10-CM | POA: Diagnosis not present

## 2015-12-19 DIAGNOSIS — I4891 Unspecified atrial fibrillation: Secondary | ICD-10-CM | POA: Diagnosis not present

## 2015-12-19 DIAGNOSIS — I482 Chronic atrial fibrillation: Secondary | ICD-10-CM | POA: Diagnosis not present

## 2015-12-24 DIAGNOSIS — S72301K Unspecified fracture of shaft of right femur, subsequent encounter for closed fracture with nonunion: Secondary | ICD-10-CM | POA: Diagnosis not present

## 2015-12-24 DIAGNOSIS — R262 Difficulty in walking, not elsewhere classified: Secondary | ICD-10-CM | POA: Diagnosis not present

## 2015-12-25 DIAGNOSIS — S72301K Unspecified fracture of shaft of right femur, subsequent encounter for closed fracture with nonunion: Secondary | ICD-10-CM | POA: Diagnosis not present

## 2015-12-25 DIAGNOSIS — R262 Difficulty in walking, not elsewhere classified: Secondary | ICD-10-CM | POA: Diagnosis not present

## 2015-12-26 DIAGNOSIS — I482 Chronic atrial fibrillation: Secondary | ICD-10-CM | POA: Diagnosis not present

## 2015-12-26 DIAGNOSIS — I4891 Unspecified atrial fibrillation: Secondary | ICD-10-CM | POA: Diagnosis not present

## 2015-12-26 DIAGNOSIS — R262 Difficulty in walking, not elsewhere classified: Secondary | ICD-10-CM | POA: Diagnosis not present

## 2015-12-26 DIAGNOSIS — S72301K Unspecified fracture of shaft of right femur, subsequent encounter for closed fracture with nonunion: Secondary | ICD-10-CM | POA: Diagnosis not present

## 2015-12-27 DIAGNOSIS — R262 Difficulty in walking, not elsewhere classified: Secondary | ICD-10-CM | POA: Diagnosis not present

## 2015-12-27 DIAGNOSIS — S72301K Unspecified fracture of shaft of right femur, subsequent encounter for closed fracture with nonunion: Secondary | ICD-10-CM | POA: Diagnosis not present

## 2015-12-30 DIAGNOSIS — R262 Difficulty in walking, not elsewhere classified: Secondary | ICD-10-CM | POA: Diagnosis not present

## 2015-12-30 DIAGNOSIS — S72301K Unspecified fracture of shaft of right femur, subsequent encounter for closed fracture with nonunion: Secondary | ICD-10-CM | POA: Diagnosis not present

## 2015-12-31 DIAGNOSIS — S72301K Unspecified fracture of shaft of right femur, subsequent encounter for closed fracture with nonunion: Secondary | ICD-10-CM | POA: Diagnosis not present

## 2015-12-31 DIAGNOSIS — R262 Difficulty in walking, not elsewhere classified: Secondary | ICD-10-CM | POA: Diagnosis not present

## 2016-01-01 DIAGNOSIS — S72301K Unspecified fracture of shaft of right femur, subsequent encounter for closed fracture with nonunion: Secondary | ICD-10-CM | POA: Diagnosis not present

## 2016-01-01 DIAGNOSIS — R262 Difficulty in walking, not elsewhere classified: Secondary | ICD-10-CM | POA: Diagnosis not present

## 2016-01-01 DIAGNOSIS — I482 Chronic atrial fibrillation: Secondary | ICD-10-CM | POA: Diagnosis not present

## 2016-01-01 DIAGNOSIS — I4891 Unspecified atrial fibrillation: Secondary | ICD-10-CM | POA: Diagnosis not present

## 2016-01-02 DIAGNOSIS — R262 Difficulty in walking, not elsewhere classified: Secondary | ICD-10-CM | POA: Diagnosis not present

## 2016-01-02 DIAGNOSIS — S72301K Unspecified fracture of shaft of right femur, subsequent encounter for closed fracture with nonunion: Secondary | ICD-10-CM | POA: Diagnosis not present

## 2016-01-03 DIAGNOSIS — R262 Difficulty in walking, not elsewhere classified: Secondary | ICD-10-CM | POA: Diagnosis not present

## 2016-01-03 DIAGNOSIS — S72301K Unspecified fracture of shaft of right femur, subsequent encounter for closed fracture with nonunion: Secondary | ICD-10-CM | POA: Diagnosis not present

## 2016-01-04 DIAGNOSIS — S72301K Unspecified fracture of shaft of right femur, subsequent encounter for closed fracture with nonunion: Secondary | ICD-10-CM | POA: Diagnosis not present

## 2016-01-04 DIAGNOSIS — R262 Difficulty in walking, not elsewhere classified: Secondary | ICD-10-CM | POA: Diagnosis not present

## 2016-01-06 DIAGNOSIS — R262 Difficulty in walking, not elsewhere classified: Secondary | ICD-10-CM | POA: Diagnosis not present

## 2016-01-06 DIAGNOSIS — S72301K Unspecified fracture of shaft of right femur, subsequent encounter for closed fracture with nonunion: Secondary | ICD-10-CM | POA: Diagnosis not present

## 2016-01-07 DIAGNOSIS — R262 Difficulty in walking, not elsewhere classified: Secondary | ICD-10-CM | POA: Diagnosis not present

## 2016-01-07 DIAGNOSIS — S72301K Unspecified fracture of shaft of right femur, subsequent encounter for closed fracture with nonunion: Secondary | ICD-10-CM | POA: Diagnosis not present

## 2016-01-08 DIAGNOSIS — S72301K Unspecified fracture of shaft of right femur, subsequent encounter for closed fracture with nonunion: Secondary | ICD-10-CM | POA: Diagnosis not present

## 2016-01-08 DIAGNOSIS — I482 Chronic atrial fibrillation: Secondary | ICD-10-CM | POA: Diagnosis not present

## 2016-01-08 DIAGNOSIS — I4891 Unspecified atrial fibrillation: Secondary | ICD-10-CM | POA: Diagnosis not present

## 2016-01-08 DIAGNOSIS — R262 Difficulty in walking, not elsewhere classified: Secondary | ICD-10-CM | POA: Diagnosis not present

## 2016-01-09 DIAGNOSIS — S72301K Unspecified fracture of shaft of right femur, subsequent encounter for closed fracture with nonunion: Secondary | ICD-10-CM | POA: Diagnosis not present

## 2016-01-09 DIAGNOSIS — R262 Difficulty in walking, not elsewhere classified: Secondary | ICD-10-CM | POA: Diagnosis not present

## 2016-01-10 DIAGNOSIS — S72301K Unspecified fracture of shaft of right femur, subsequent encounter for closed fracture with nonunion: Secondary | ICD-10-CM | POA: Diagnosis not present

## 2016-01-10 DIAGNOSIS — I4891 Unspecified atrial fibrillation: Secondary | ICD-10-CM | POA: Diagnosis not present

## 2016-01-10 DIAGNOSIS — I482 Chronic atrial fibrillation: Secondary | ICD-10-CM | POA: Diagnosis not present

## 2016-01-10 DIAGNOSIS — R262 Difficulty in walking, not elsewhere classified: Secondary | ICD-10-CM | POA: Diagnosis not present

## 2016-01-13 DIAGNOSIS — I482 Chronic atrial fibrillation: Secondary | ICD-10-CM | POA: Diagnosis not present

## 2016-01-13 DIAGNOSIS — I4891 Unspecified atrial fibrillation: Secondary | ICD-10-CM | POA: Diagnosis not present

## 2016-01-14 DIAGNOSIS — J449 Chronic obstructive pulmonary disease, unspecified: Secondary | ICD-10-CM | POA: Diagnosis not present

## 2016-01-14 DIAGNOSIS — F0281 Dementia in other diseases classified elsewhere with behavioral disturbance: Secondary | ICD-10-CM | POA: Diagnosis not present

## 2016-01-14 DIAGNOSIS — I480 Paroxysmal atrial fibrillation: Secondary | ICD-10-CM | POA: Diagnosis not present

## 2016-01-14 DIAGNOSIS — Z7901 Long term (current) use of anticoagulants: Secondary | ICD-10-CM | POA: Diagnosis not present

## 2016-01-16 DIAGNOSIS — I4891 Unspecified atrial fibrillation: Secondary | ICD-10-CM | POA: Diagnosis not present

## 2016-01-16 DIAGNOSIS — I482 Chronic atrial fibrillation: Secondary | ICD-10-CM | POA: Diagnosis not present

## 2016-01-19 DIAGNOSIS — E039 Hypothyroidism, unspecified: Secondary | ICD-10-CM | POA: Diagnosis not present

## 2016-01-19 DIAGNOSIS — M6281 Muscle weakness (generalized): Secondary | ICD-10-CM | POA: Diagnosis not present

## 2016-01-19 DIAGNOSIS — I10 Essential (primary) hypertension: Secondary | ICD-10-CM | POA: Diagnosis not present

## 2016-01-19 DIAGNOSIS — J449 Chronic obstructive pulmonary disease, unspecified: Secondary | ICD-10-CM | POA: Diagnosis not present

## 2016-01-24 DIAGNOSIS — I482 Chronic atrial fibrillation: Secondary | ICD-10-CM | POA: Diagnosis not present

## 2016-01-24 DIAGNOSIS — F0280 Dementia in other diseases classified elsewhere without behavioral disturbance: Secondary | ICD-10-CM | POA: Diagnosis not present

## 2016-01-24 DIAGNOSIS — F411 Generalized anxiety disorder: Secondary | ICD-10-CM | POA: Diagnosis not present

## 2016-01-24 DIAGNOSIS — F321 Major depressive disorder, single episode, moderate: Secondary | ICD-10-CM | POA: Diagnosis not present

## 2016-01-24 DIAGNOSIS — I4891 Unspecified atrial fibrillation: Secondary | ICD-10-CM | POA: Diagnosis not present

## 2016-01-27 DIAGNOSIS — I482 Chronic atrial fibrillation: Secondary | ICD-10-CM | POA: Diagnosis not present

## 2016-01-27 DIAGNOSIS — I4891 Unspecified atrial fibrillation: Secondary | ICD-10-CM | POA: Diagnosis not present

## 2016-01-27 DIAGNOSIS — F0391 Unspecified dementia with behavioral disturbance: Secondary | ICD-10-CM | POA: Diagnosis not present

## 2016-02-06 DIAGNOSIS — J449 Chronic obstructive pulmonary disease, unspecified: Secondary | ICD-10-CM | POA: Diagnosis not present

## 2016-02-06 DIAGNOSIS — I10 Essential (primary) hypertension: Secondary | ICD-10-CM | POA: Diagnosis not present

## 2016-02-06 DIAGNOSIS — Z23 Encounter for immunization: Secondary | ICD-10-CM | POA: Diagnosis not present

## 2016-02-06 DIAGNOSIS — E119 Type 2 diabetes mellitus without complications: Secondary | ICD-10-CM | POA: Diagnosis not present

## 2016-02-06 DIAGNOSIS — E039 Hypothyroidism, unspecified: Secondary | ICD-10-CM | POA: Diagnosis not present

## 2016-02-11 DIAGNOSIS — H353134 Nonexudative age-related macular degeneration, bilateral, advanced atrophic with subfoveal involvement: Secondary | ICD-10-CM | POA: Diagnosis not present

## 2016-02-11 DIAGNOSIS — I480 Paroxysmal atrial fibrillation: Secondary | ICD-10-CM | POA: Diagnosis not present

## 2016-02-11 DIAGNOSIS — J449 Chronic obstructive pulmonary disease, unspecified: Secondary | ICD-10-CM | POA: Diagnosis not present

## 2016-02-11 DIAGNOSIS — F0281 Dementia in other diseases classified elsewhere with behavioral disturbance: Secondary | ICD-10-CM | POA: Diagnosis not present

## 2016-02-11 DIAGNOSIS — E119 Type 2 diabetes mellitus without complications: Secondary | ICD-10-CM | POA: Diagnosis not present

## 2016-02-11 DIAGNOSIS — Z794 Long term (current) use of insulin: Secondary | ICD-10-CM | POA: Diagnosis not present

## 2016-02-11 DIAGNOSIS — I10 Essential (primary) hypertension: Secondary | ICD-10-CM | POA: Diagnosis not present

## 2016-02-11 DIAGNOSIS — Z7901 Long term (current) use of anticoagulants: Secondary | ICD-10-CM | POA: Diagnosis not present

## 2016-02-12 DIAGNOSIS — I4891 Unspecified atrial fibrillation: Secondary | ICD-10-CM | POA: Diagnosis not present

## 2016-02-12 DIAGNOSIS — E109 Type 1 diabetes mellitus without complications: Secondary | ICD-10-CM | POA: Diagnosis not present

## 2016-02-12 DIAGNOSIS — E785 Hyperlipidemia, unspecified: Secondary | ICD-10-CM | POA: Diagnosis not present

## 2016-02-12 DIAGNOSIS — E119 Type 2 diabetes mellitus without complications: Secondary | ICD-10-CM | POA: Diagnosis not present

## 2016-02-13 DIAGNOSIS — E039 Hypothyroidism, unspecified: Secondary | ICD-10-CM | POA: Diagnosis not present

## 2016-02-13 DIAGNOSIS — R899 Unspecified abnormal finding in specimens from other organs, systems and tissues: Secondary | ICD-10-CM | POA: Diagnosis not present

## 2016-02-14 DIAGNOSIS — E039 Hypothyroidism, unspecified: Secondary | ICD-10-CM | POA: Diagnosis not present

## 2016-02-19 DIAGNOSIS — E039 Hypothyroidism, unspecified: Secondary | ICD-10-CM | POA: Diagnosis not present

## 2016-02-21 DIAGNOSIS — R262 Difficulty in walking, not elsewhere classified: Secondary | ICD-10-CM | POA: Diagnosis not present

## 2016-02-21 DIAGNOSIS — I509 Heart failure, unspecified: Secondary | ICD-10-CM | POA: Diagnosis not present

## 2016-02-21 DIAGNOSIS — E039 Hypothyroidism, unspecified: Secondary | ICD-10-CM | POA: Diagnosis not present

## 2016-02-21 DIAGNOSIS — E119 Type 2 diabetes mellitus without complications: Secondary | ICD-10-CM | POA: Diagnosis not present

## 2016-03-02 DIAGNOSIS — R0602 Shortness of breath: Secondary | ICD-10-CM | POA: Diagnosis not present

## 2016-03-10 DIAGNOSIS — F0391 Unspecified dementia with behavioral disturbance: Secondary | ICD-10-CM | POA: Diagnosis not present

## 2016-03-10 DIAGNOSIS — F39 Unspecified mood [affective] disorder: Secondary | ICD-10-CM | POA: Diagnosis not present

## 2016-03-10 DIAGNOSIS — F0281 Dementia in other diseases classified elsewhere with behavioral disturbance: Secondary | ICD-10-CM | POA: Diagnosis not present

## 2016-03-10 DIAGNOSIS — J449 Chronic obstructive pulmonary disease, unspecified: Secondary | ICD-10-CM | POA: Diagnosis not present

## 2016-03-12 DIAGNOSIS — I509 Heart failure, unspecified: Secondary | ICD-10-CM | POA: Diagnosis not present

## 2016-03-12 DIAGNOSIS — R4182 Altered mental status, unspecified: Secondary | ICD-10-CM | POA: Diagnosis not present

## 2016-03-12 DIAGNOSIS — R531 Weakness: Secondary | ICD-10-CM | POA: Diagnosis not present

## 2016-03-12 DIAGNOSIS — D649 Anemia, unspecified: Secondary | ICD-10-CM | POA: Diagnosis not present

## 2016-03-12 DIAGNOSIS — R9431 Abnormal electrocardiogram [ECG] [EKG]: Secondary | ICD-10-CM | POA: Diagnosis not present

## 2016-03-12 DIAGNOSIS — E860 Dehydration: Secondary | ICD-10-CM | POA: Diagnosis not present

## 2016-03-12 DIAGNOSIS — E119 Type 2 diabetes mellitus without complications: Secondary | ICD-10-CM | POA: Diagnosis not present

## 2016-03-13 DIAGNOSIS — F0281 Dementia in other diseases classified elsewhere with behavioral disturbance: Secondary | ICD-10-CM | POA: Diagnosis not present

## 2016-03-13 DIAGNOSIS — F321 Major depressive disorder, single episode, moderate: Secondary | ICD-10-CM | POA: Diagnosis not present

## 2016-03-13 DIAGNOSIS — F411 Generalized anxiety disorder: Secondary | ICD-10-CM | POA: Diagnosis not present

## 2016-03-15 DIAGNOSIS — D649 Anemia, unspecified: Secondary | ICD-10-CM | POA: Diagnosis not present

## 2016-03-15 DIAGNOSIS — E039 Hypothyroidism, unspecified: Secondary | ICD-10-CM | POA: Diagnosis not present

## 2016-03-15 DIAGNOSIS — I10 Essential (primary) hypertension: Secondary | ICD-10-CM | POA: Diagnosis not present

## 2016-03-15 DIAGNOSIS — J449 Chronic obstructive pulmonary disease, unspecified: Secondary | ICD-10-CM | POA: Diagnosis not present

## 2016-03-16 DIAGNOSIS — I70203 Unspecified atherosclerosis of native arteries of extremities, bilateral legs: Secondary | ICD-10-CM | POA: Diagnosis not present

## 2016-03-16 DIAGNOSIS — I739 Peripheral vascular disease, unspecified: Secondary | ICD-10-CM | POA: Diagnosis not present

## 2016-03-16 DIAGNOSIS — B351 Tinea unguium: Secondary | ICD-10-CM | POA: Diagnosis not present

## 2016-03-16 DIAGNOSIS — E119 Type 2 diabetes mellitus without complications: Secondary | ICD-10-CM | POA: Diagnosis not present

## 2016-03-29 DIAGNOSIS — I509 Heart failure, unspecified: Secondary | ICD-10-CM | POA: Diagnosis not present

## 2016-03-29 DIAGNOSIS — E119 Type 2 diabetes mellitus without complications: Secondary | ICD-10-CM | POA: Diagnosis not present

## 2016-03-29 DIAGNOSIS — R262 Difficulty in walking, not elsewhere classified: Secondary | ICD-10-CM | POA: Diagnosis not present

## 2016-03-29 DIAGNOSIS — E039 Hypothyroidism, unspecified: Secondary | ICD-10-CM | POA: Diagnosis not present

## 2016-04-01 DIAGNOSIS — I10 Essential (primary) hypertension: Secondary | ICD-10-CM | POA: Diagnosis not present

## 2016-04-01 DIAGNOSIS — J449 Chronic obstructive pulmonary disease, unspecified: Secondary | ICD-10-CM | POA: Diagnosis not present

## 2016-04-01 DIAGNOSIS — D649 Anemia, unspecified: Secondary | ICD-10-CM | POA: Diagnosis not present

## 2016-04-01 DIAGNOSIS — E039 Hypothyroidism, unspecified: Secondary | ICD-10-CM | POA: Diagnosis not present

## 2016-04-08 DIAGNOSIS — E784 Other hyperlipidemia: Secondary | ICD-10-CM | POA: Diagnosis not present

## 2016-04-08 DIAGNOSIS — F0281 Dementia in other diseases classified elsewhere with behavioral disturbance: Secondary | ICD-10-CM | POA: Diagnosis not present

## 2016-04-08 DIAGNOSIS — K219 Gastro-esophageal reflux disease without esophagitis: Secondary | ICD-10-CM | POA: Diagnosis not present

## 2016-04-08 DIAGNOSIS — F39 Unspecified mood [affective] disorder: Secondary | ICD-10-CM | POA: Diagnosis not present

## 2016-04-10 DIAGNOSIS — E039 Hypothyroidism, unspecified: Secondary | ICD-10-CM | POA: Diagnosis not present

## 2016-04-20 DIAGNOSIS — E039 Hypothyroidism, unspecified: Secondary | ICD-10-CM | POA: Diagnosis not present

## 2016-04-20 DIAGNOSIS — E119 Type 2 diabetes mellitus without complications: Secondary | ICD-10-CM | POA: Diagnosis not present

## 2016-04-20 DIAGNOSIS — R262 Difficulty in walking, not elsewhere classified: Secondary | ICD-10-CM | POA: Diagnosis not present

## 2016-04-20 DIAGNOSIS — I509 Heart failure, unspecified: Secondary | ICD-10-CM | POA: Diagnosis not present

## 2016-04-29 DIAGNOSIS — R262 Difficulty in walking, not elsewhere classified: Secondary | ICD-10-CM | POA: Diagnosis not present

## 2016-04-29 DIAGNOSIS — S72301K Unspecified fracture of shaft of right femur, subsequent encounter for closed fracture with nonunion: Secondary | ICD-10-CM | POA: Diagnosis not present

## 2016-04-29 DIAGNOSIS — R1311 Dysphagia, oral phase: Secondary | ICD-10-CM | POA: Diagnosis not present

## 2016-04-29 DIAGNOSIS — R279 Unspecified lack of coordination: Secondary | ICD-10-CM | POA: Diagnosis not present

## 2016-04-30 DIAGNOSIS — R262 Difficulty in walking, not elsewhere classified: Secondary | ICD-10-CM | POA: Diagnosis not present

## 2016-04-30 DIAGNOSIS — R1311 Dysphagia, oral phase: Secondary | ICD-10-CM | POA: Diagnosis not present

## 2016-04-30 DIAGNOSIS — S72301K Unspecified fracture of shaft of right femur, subsequent encounter for closed fracture with nonunion: Secondary | ICD-10-CM | POA: Diagnosis not present

## 2016-04-30 DIAGNOSIS — R279 Unspecified lack of coordination: Secondary | ICD-10-CM | POA: Diagnosis not present

## 2016-05-01 DIAGNOSIS — R1311 Dysphagia, oral phase: Secondary | ICD-10-CM | POA: Diagnosis not present

## 2016-05-01 DIAGNOSIS — R279 Unspecified lack of coordination: Secondary | ICD-10-CM | POA: Diagnosis not present

## 2016-05-01 DIAGNOSIS — R262 Difficulty in walking, not elsewhere classified: Secondary | ICD-10-CM | POA: Diagnosis not present

## 2016-05-01 DIAGNOSIS — S72301K Unspecified fracture of shaft of right femur, subsequent encounter for closed fracture with nonunion: Secondary | ICD-10-CM | POA: Diagnosis not present

## 2016-05-02 DIAGNOSIS — S72301K Unspecified fracture of shaft of right femur, subsequent encounter for closed fracture with nonunion: Secondary | ICD-10-CM | POA: Diagnosis not present

## 2016-05-02 DIAGNOSIS — R1311 Dysphagia, oral phase: Secondary | ICD-10-CM | POA: Diagnosis not present

## 2016-05-02 DIAGNOSIS — R262 Difficulty in walking, not elsewhere classified: Secondary | ICD-10-CM | POA: Diagnosis not present

## 2016-05-02 DIAGNOSIS — R279 Unspecified lack of coordination: Secondary | ICD-10-CM | POA: Diagnosis not present

## 2016-05-03 DIAGNOSIS — R279 Unspecified lack of coordination: Secondary | ICD-10-CM | POA: Diagnosis not present

## 2016-05-03 DIAGNOSIS — S72301K Unspecified fracture of shaft of right femur, subsequent encounter for closed fracture with nonunion: Secondary | ICD-10-CM | POA: Diagnosis not present

## 2016-05-03 DIAGNOSIS — R1311 Dysphagia, oral phase: Secondary | ICD-10-CM | POA: Diagnosis not present

## 2016-05-03 DIAGNOSIS — R262 Difficulty in walking, not elsewhere classified: Secondary | ICD-10-CM | POA: Diagnosis not present

## 2016-05-04 DIAGNOSIS — I480 Paroxysmal atrial fibrillation: Secondary | ICD-10-CM | POA: Diagnosis not present

## 2016-05-04 DIAGNOSIS — F0281 Dementia in other diseases classified elsewhere with behavioral disturbance: Secondary | ICD-10-CM | POA: Diagnosis not present

## 2016-05-04 DIAGNOSIS — R262 Difficulty in walking, not elsewhere classified: Secondary | ICD-10-CM | POA: Diagnosis not present

## 2016-05-04 DIAGNOSIS — R1311 Dysphagia, oral phase: Secondary | ICD-10-CM | POA: Diagnosis not present

## 2016-05-04 DIAGNOSIS — I10 Essential (primary) hypertension: Secondary | ICD-10-CM | POA: Diagnosis not present

## 2016-05-04 DIAGNOSIS — S72301K Unspecified fracture of shaft of right femur, subsequent encounter for closed fracture with nonunion: Secondary | ICD-10-CM | POA: Diagnosis not present

## 2016-05-04 DIAGNOSIS — J449 Chronic obstructive pulmonary disease, unspecified: Secondary | ICD-10-CM | POA: Diagnosis not present

## 2016-05-04 DIAGNOSIS — R279 Unspecified lack of coordination: Secondary | ICD-10-CM | POA: Diagnosis not present

## 2016-05-05 DIAGNOSIS — R1311 Dysphagia, oral phase: Secondary | ICD-10-CM | POA: Diagnosis not present

## 2016-05-05 DIAGNOSIS — S72301K Unspecified fracture of shaft of right femur, subsequent encounter for closed fracture with nonunion: Secondary | ICD-10-CM | POA: Diagnosis not present

## 2016-05-05 DIAGNOSIS — R262 Difficulty in walking, not elsewhere classified: Secondary | ICD-10-CM | POA: Diagnosis not present

## 2016-05-05 DIAGNOSIS — R279 Unspecified lack of coordination: Secondary | ICD-10-CM | POA: Diagnosis not present

## 2016-05-06 DIAGNOSIS — R1311 Dysphagia, oral phase: Secondary | ICD-10-CM | POA: Diagnosis not present

## 2016-05-06 DIAGNOSIS — S72301K Unspecified fracture of shaft of right femur, subsequent encounter for closed fracture with nonunion: Secondary | ICD-10-CM | POA: Diagnosis not present

## 2016-05-06 DIAGNOSIS — R262 Difficulty in walking, not elsewhere classified: Secondary | ICD-10-CM | POA: Diagnosis not present

## 2016-05-06 DIAGNOSIS — R279 Unspecified lack of coordination: Secondary | ICD-10-CM | POA: Diagnosis not present

## 2016-05-07 DIAGNOSIS — S72301K Unspecified fracture of shaft of right femur, subsequent encounter for closed fracture with nonunion: Secondary | ICD-10-CM | POA: Diagnosis not present

## 2016-05-07 DIAGNOSIS — R262 Difficulty in walking, not elsewhere classified: Secondary | ICD-10-CM | POA: Diagnosis not present

## 2016-05-07 DIAGNOSIS — R279 Unspecified lack of coordination: Secondary | ICD-10-CM | POA: Diagnosis not present

## 2016-05-07 DIAGNOSIS — R1311 Dysphagia, oral phase: Secondary | ICD-10-CM | POA: Diagnosis not present

## 2016-05-08 DIAGNOSIS — R262 Difficulty in walking, not elsewhere classified: Secondary | ICD-10-CM | POA: Diagnosis not present

## 2016-05-08 DIAGNOSIS — R1311 Dysphagia, oral phase: Secondary | ICD-10-CM | POA: Diagnosis not present

## 2016-05-08 DIAGNOSIS — R279 Unspecified lack of coordination: Secondary | ICD-10-CM | POA: Diagnosis not present

## 2016-05-08 DIAGNOSIS — S72301K Unspecified fracture of shaft of right femur, subsequent encounter for closed fracture with nonunion: Secondary | ICD-10-CM | POA: Diagnosis not present

## 2016-05-17 DIAGNOSIS — D649 Anemia, unspecified: Secondary | ICD-10-CM | POA: Diagnosis not present

## 2016-05-17 DIAGNOSIS — E039 Hypothyroidism, unspecified: Secondary | ICD-10-CM | POA: Diagnosis not present

## 2016-05-17 DIAGNOSIS — I10 Essential (primary) hypertension: Secondary | ICD-10-CM | POA: Diagnosis not present

## 2016-05-17 DIAGNOSIS — R262 Difficulty in walking, not elsewhere classified: Secondary | ICD-10-CM | POA: Diagnosis not present

## 2016-05-18 DIAGNOSIS — R109 Unspecified abdominal pain: Secondary | ICD-10-CM | POA: Diagnosis not present

## 2016-05-21 DIAGNOSIS — R262 Difficulty in walking, not elsewhere classified: Secondary | ICD-10-CM | POA: Diagnosis not present

## 2016-05-21 DIAGNOSIS — S72301K Unspecified fracture of shaft of right femur, subsequent encounter for closed fracture with nonunion: Secondary | ICD-10-CM | POA: Diagnosis not present

## 2016-05-21 DIAGNOSIS — R1311 Dysphagia, oral phase: Secondary | ICD-10-CM | POA: Diagnosis not present

## 2016-05-21 DIAGNOSIS — R279 Unspecified lack of coordination: Secondary | ICD-10-CM | POA: Diagnosis not present

## 2016-05-22 DIAGNOSIS — R279 Unspecified lack of coordination: Secondary | ICD-10-CM | POA: Diagnosis not present

## 2016-05-22 DIAGNOSIS — F411 Generalized anxiety disorder: Secondary | ICD-10-CM | POA: Diagnosis not present

## 2016-05-22 DIAGNOSIS — S72301K Unspecified fracture of shaft of right femur, subsequent encounter for closed fracture with nonunion: Secondary | ICD-10-CM | POA: Diagnosis not present

## 2016-05-22 DIAGNOSIS — F321 Major depressive disorder, single episode, moderate: Secondary | ICD-10-CM | POA: Diagnosis not present

## 2016-05-22 DIAGNOSIS — F0281 Dementia in other diseases classified elsewhere with behavioral disturbance: Secondary | ICD-10-CM | POA: Diagnosis not present

## 2016-05-22 DIAGNOSIS — R1311 Dysphagia, oral phase: Secondary | ICD-10-CM | POA: Diagnosis not present

## 2016-05-22 DIAGNOSIS — R262 Difficulty in walking, not elsewhere classified: Secondary | ICD-10-CM | POA: Diagnosis not present

## 2016-05-25 DIAGNOSIS — R262 Difficulty in walking, not elsewhere classified: Secondary | ICD-10-CM | POA: Diagnosis not present

## 2016-05-25 DIAGNOSIS — R1311 Dysphagia, oral phase: Secondary | ICD-10-CM | POA: Diagnosis not present

## 2016-05-25 DIAGNOSIS — R279 Unspecified lack of coordination: Secondary | ICD-10-CM | POA: Diagnosis not present

## 2016-05-25 DIAGNOSIS — S72301K Unspecified fracture of shaft of right femur, subsequent encounter for closed fracture with nonunion: Secondary | ICD-10-CM | POA: Diagnosis not present

## 2016-05-28 DIAGNOSIS — R1311 Dysphagia, oral phase: Secondary | ICD-10-CM | POA: Diagnosis not present

## 2016-05-28 DIAGNOSIS — R279 Unspecified lack of coordination: Secondary | ICD-10-CM | POA: Diagnosis not present

## 2016-05-28 DIAGNOSIS — S72301K Unspecified fracture of shaft of right femur, subsequent encounter for closed fracture with nonunion: Secondary | ICD-10-CM | POA: Diagnosis not present

## 2016-05-28 DIAGNOSIS — R262 Difficulty in walking, not elsewhere classified: Secondary | ICD-10-CM | POA: Diagnosis not present

## 2016-05-31 DIAGNOSIS — I10 Essential (primary) hypertension: Secondary | ICD-10-CM | POA: Diagnosis not present

## 2016-05-31 DIAGNOSIS — E039 Hypothyroidism, unspecified: Secondary | ICD-10-CM | POA: Diagnosis not present

## 2016-05-31 DIAGNOSIS — D649 Anemia, unspecified: Secondary | ICD-10-CM | POA: Diagnosis not present

## 2016-05-31 DIAGNOSIS — E785 Hyperlipidemia, unspecified: Secondary | ICD-10-CM | POA: Diagnosis not present

## 2016-06-01 DIAGNOSIS — J449 Chronic obstructive pulmonary disease, unspecified: Secondary | ICD-10-CM | POA: Diagnosis not present

## 2016-06-01 DIAGNOSIS — I10 Essential (primary) hypertension: Secondary | ICD-10-CM | POA: Diagnosis not present

## 2016-06-01 DIAGNOSIS — I482 Chronic atrial fibrillation: Secondary | ICD-10-CM | POA: Diagnosis not present

## 2016-06-01 DIAGNOSIS — F0281 Dementia in other diseases classified elsewhere with behavioral disturbance: Secondary | ICD-10-CM | POA: Diagnosis not present

## 2016-06-03 DIAGNOSIS — E039 Hypothyroidism, unspecified: Secondary | ICD-10-CM | POA: Diagnosis not present

## 2016-06-03 DIAGNOSIS — E785 Hyperlipidemia, unspecified: Secondary | ICD-10-CM | POA: Diagnosis not present

## 2016-06-03 DIAGNOSIS — D649 Anemia, unspecified: Secondary | ICD-10-CM | POA: Diagnosis not present

## 2016-06-03 DIAGNOSIS — E109 Type 1 diabetes mellitus without complications: Secondary | ICD-10-CM | POA: Diagnosis not present

## 2016-06-03 DIAGNOSIS — E119 Type 2 diabetes mellitus without complications: Secondary | ICD-10-CM | POA: Diagnosis not present

## 2016-06-07 DIAGNOSIS — E785 Hyperlipidemia, unspecified: Secondary | ICD-10-CM | POA: Diagnosis not present

## 2016-06-07 DIAGNOSIS — R262 Difficulty in walking, not elsewhere classified: Secondary | ICD-10-CM | POA: Diagnosis not present

## 2016-06-07 DIAGNOSIS — K219 Gastro-esophageal reflux disease without esophagitis: Secondary | ICD-10-CM | POA: Diagnosis not present

## 2016-06-07 DIAGNOSIS — I10 Essential (primary) hypertension: Secondary | ICD-10-CM | POA: Diagnosis not present

## 2016-06-30 DIAGNOSIS — I10 Essential (primary) hypertension: Secondary | ICD-10-CM | POA: Diagnosis not present

## 2016-06-30 DIAGNOSIS — J449 Chronic obstructive pulmonary disease, unspecified: Secondary | ICD-10-CM | POA: Diagnosis not present

## 2016-06-30 DIAGNOSIS — E119 Type 2 diabetes mellitus without complications: Secondary | ICD-10-CM | POA: Diagnosis not present

## 2016-06-30 DIAGNOSIS — E039 Hypothyroidism, unspecified: Secondary | ICD-10-CM | POA: Diagnosis not present

## 2016-07-05 DIAGNOSIS — E119 Type 2 diabetes mellitus without complications: Secondary | ICD-10-CM | POA: Diagnosis not present

## 2016-07-05 DIAGNOSIS — I10 Essential (primary) hypertension: Secondary | ICD-10-CM | POA: Diagnosis not present

## 2016-07-05 DIAGNOSIS — E785 Hyperlipidemia, unspecified: Secondary | ICD-10-CM | POA: Diagnosis not present

## 2016-07-05 DIAGNOSIS — E039 Hypothyroidism, unspecified: Secondary | ICD-10-CM | POA: Diagnosis not present

## 2016-07-17 DIAGNOSIS — M25552 Pain in left hip: Secondary | ICD-10-CM | POA: Diagnosis not present

## 2016-07-22 DIAGNOSIS — R918 Other nonspecific abnormal finding of lung field: Secondary | ICD-10-CM | POA: Diagnosis not present

## 2016-07-24 DIAGNOSIS — R279 Unspecified lack of coordination: Secondary | ICD-10-CM | POA: Diagnosis not present

## 2016-07-24 DIAGNOSIS — R262 Difficulty in walking, not elsewhere classified: Secondary | ICD-10-CM | POA: Diagnosis not present

## 2016-07-24 DIAGNOSIS — R1311 Dysphagia, oral phase: Secondary | ICD-10-CM | POA: Diagnosis not present

## 2016-07-24 DIAGNOSIS — S72301K Unspecified fracture of shaft of right femur, subsequent encounter for closed fracture with nonunion: Secondary | ICD-10-CM | POA: Diagnosis not present

## 2016-07-25 DIAGNOSIS — R1311 Dysphagia, oral phase: Secondary | ICD-10-CM | POA: Diagnosis not present

## 2016-07-25 DIAGNOSIS — R279 Unspecified lack of coordination: Secondary | ICD-10-CM | POA: Diagnosis not present

## 2016-07-25 DIAGNOSIS — S72301K Unspecified fracture of shaft of right femur, subsequent encounter for closed fracture with nonunion: Secondary | ICD-10-CM | POA: Diagnosis not present

## 2016-07-25 DIAGNOSIS — R262 Difficulty in walking, not elsewhere classified: Secondary | ICD-10-CM | POA: Diagnosis not present

## 2016-07-26 DIAGNOSIS — R279 Unspecified lack of coordination: Secondary | ICD-10-CM | POA: Diagnosis not present

## 2016-07-26 DIAGNOSIS — R262 Difficulty in walking, not elsewhere classified: Secondary | ICD-10-CM | POA: Diagnosis not present

## 2016-07-26 DIAGNOSIS — R1311 Dysphagia, oral phase: Secondary | ICD-10-CM | POA: Diagnosis not present

## 2016-07-26 DIAGNOSIS — S72301K Unspecified fracture of shaft of right femur, subsequent encounter for closed fracture with nonunion: Secondary | ICD-10-CM | POA: Diagnosis not present

## 2016-07-27 DIAGNOSIS — R262 Difficulty in walking, not elsewhere classified: Secondary | ICD-10-CM | POA: Diagnosis not present

## 2016-07-27 DIAGNOSIS — R1311 Dysphagia, oral phase: Secondary | ICD-10-CM | POA: Diagnosis not present

## 2016-07-27 DIAGNOSIS — S72301K Unspecified fracture of shaft of right femur, subsequent encounter for closed fracture with nonunion: Secondary | ICD-10-CM | POA: Diagnosis not present

## 2016-07-27 DIAGNOSIS — R279 Unspecified lack of coordination: Secondary | ICD-10-CM | POA: Diagnosis not present

## 2016-07-28 DIAGNOSIS — R1311 Dysphagia, oral phase: Secondary | ICD-10-CM | POA: Diagnosis not present

## 2016-07-28 DIAGNOSIS — R279 Unspecified lack of coordination: Secondary | ICD-10-CM | POA: Diagnosis not present

## 2016-07-28 DIAGNOSIS — R262 Difficulty in walking, not elsewhere classified: Secondary | ICD-10-CM | POA: Diagnosis not present

## 2016-07-28 DIAGNOSIS — S72301K Unspecified fracture of shaft of right femur, subsequent encounter for closed fracture with nonunion: Secondary | ICD-10-CM | POA: Diagnosis not present

## 2016-07-29 DIAGNOSIS — R262 Difficulty in walking, not elsewhere classified: Secondary | ICD-10-CM | POA: Diagnosis not present

## 2016-07-29 DIAGNOSIS — R1311 Dysphagia, oral phase: Secondary | ICD-10-CM | POA: Diagnosis not present

## 2016-07-29 DIAGNOSIS — R279 Unspecified lack of coordination: Secondary | ICD-10-CM | POA: Diagnosis not present

## 2016-07-29 DIAGNOSIS — S72301K Unspecified fracture of shaft of right femur, subsequent encounter for closed fracture with nonunion: Secondary | ICD-10-CM | POA: Diagnosis not present

## 2016-07-30 DIAGNOSIS — E119 Type 2 diabetes mellitus without complications: Secondary | ICD-10-CM | POA: Diagnosis not present

## 2016-07-30 DIAGNOSIS — E039 Hypothyroidism, unspecified: Secondary | ICD-10-CM | POA: Diagnosis not present

## 2016-07-30 DIAGNOSIS — R1311 Dysphagia, oral phase: Secondary | ICD-10-CM | POA: Diagnosis not present

## 2016-07-30 DIAGNOSIS — R262 Difficulty in walking, not elsewhere classified: Secondary | ICD-10-CM | POA: Diagnosis not present

## 2016-07-30 DIAGNOSIS — R279 Unspecified lack of coordination: Secondary | ICD-10-CM | POA: Diagnosis not present

## 2016-07-30 DIAGNOSIS — S72301K Unspecified fracture of shaft of right femur, subsequent encounter for closed fracture with nonunion: Secondary | ICD-10-CM | POA: Diagnosis not present

## 2016-07-30 DIAGNOSIS — D649 Anemia, unspecified: Secondary | ICD-10-CM | POA: Diagnosis not present

## 2016-07-30 DIAGNOSIS — E109 Type 1 diabetes mellitus without complications: Secondary | ICD-10-CM | POA: Diagnosis not present

## 2016-07-31 DIAGNOSIS — F411 Generalized anxiety disorder: Secondary | ICD-10-CM | POA: Diagnosis not present

## 2016-07-31 DIAGNOSIS — R279 Unspecified lack of coordination: Secondary | ICD-10-CM | POA: Diagnosis not present

## 2016-07-31 DIAGNOSIS — R262 Difficulty in walking, not elsewhere classified: Secondary | ICD-10-CM | POA: Diagnosis not present

## 2016-07-31 DIAGNOSIS — S72301K Unspecified fracture of shaft of right femur, subsequent encounter for closed fracture with nonunion: Secondary | ICD-10-CM | POA: Diagnosis not present

## 2016-07-31 DIAGNOSIS — R1311 Dysphagia, oral phase: Secondary | ICD-10-CM | POA: Diagnosis not present

## 2016-07-31 DIAGNOSIS — F0281 Dementia in other diseases classified elsewhere with behavioral disturbance: Secondary | ICD-10-CM | POA: Diagnosis not present

## 2016-07-31 DIAGNOSIS — I739 Peripheral vascular disease, unspecified: Secondary | ICD-10-CM | POA: Diagnosis not present

## 2016-07-31 DIAGNOSIS — E119 Type 2 diabetes mellitus without complications: Secondary | ICD-10-CM | POA: Diagnosis not present

## 2016-07-31 DIAGNOSIS — B351 Tinea unguium: Secondary | ICD-10-CM | POA: Diagnosis not present

## 2016-07-31 DIAGNOSIS — I70203 Unspecified atherosclerosis of native arteries of extremities, bilateral legs: Secondary | ICD-10-CM | POA: Diagnosis not present

## 2016-07-31 DIAGNOSIS — F321 Major depressive disorder, single episode, moderate: Secondary | ICD-10-CM | POA: Diagnosis not present

## 2016-08-03 DIAGNOSIS — R262 Difficulty in walking, not elsewhere classified: Secondary | ICD-10-CM | POA: Diagnosis not present

## 2016-08-03 DIAGNOSIS — S72301K Unspecified fracture of shaft of right femur, subsequent encounter for closed fracture with nonunion: Secondary | ICD-10-CM | POA: Diagnosis not present

## 2016-08-03 DIAGNOSIS — R279 Unspecified lack of coordination: Secondary | ICD-10-CM | POA: Diagnosis not present

## 2016-08-03 DIAGNOSIS — R1311 Dysphagia, oral phase: Secondary | ICD-10-CM | POA: Diagnosis not present

## 2016-08-04 DIAGNOSIS — R1311 Dysphagia, oral phase: Secondary | ICD-10-CM | POA: Diagnosis not present

## 2016-08-04 DIAGNOSIS — S72301K Unspecified fracture of shaft of right femur, subsequent encounter for closed fracture with nonunion: Secondary | ICD-10-CM | POA: Diagnosis not present

## 2016-08-04 DIAGNOSIS — R262 Difficulty in walking, not elsewhere classified: Secondary | ICD-10-CM | POA: Diagnosis not present

## 2016-08-04 DIAGNOSIS — R279 Unspecified lack of coordination: Secondary | ICD-10-CM | POA: Diagnosis not present

## 2016-08-05 DIAGNOSIS — R279 Unspecified lack of coordination: Secondary | ICD-10-CM | POA: Diagnosis not present

## 2016-08-05 DIAGNOSIS — R262 Difficulty in walking, not elsewhere classified: Secondary | ICD-10-CM | POA: Diagnosis not present

## 2016-08-05 DIAGNOSIS — R1311 Dysphagia, oral phase: Secondary | ICD-10-CM | POA: Diagnosis not present

## 2016-08-05 DIAGNOSIS — S72301K Unspecified fracture of shaft of right femur, subsequent encounter for closed fracture with nonunion: Secondary | ICD-10-CM | POA: Diagnosis not present

## 2016-08-06 DIAGNOSIS — R1311 Dysphagia, oral phase: Secondary | ICD-10-CM | POA: Diagnosis not present

## 2016-08-06 DIAGNOSIS — R279 Unspecified lack of coordination: Secondary | ICD-10-CM | POA: Diagnosis not present

## 2016-08-06 DIAGNOSIS — R262 Difficulty in walking, not elsewhere classified: Secondary | ICD-10-CM | POA: Diagnosis not present

## 2016-08-06 DIAGNOSIS — S72301K Unspecified fracture of shaft of right femur, subsequent encounter for closed fracture with nonunion: Secondary | ICD-10-CM | POA: Diagnosis not present

## 2016-08-07 DIAGNOSIS — R279 Unspecified lack of coordination: Secondary | ICD-10-CM | POA: Diagnosis not present

## 2016-08-07 DIAGNOSIS — R262 Difficulty in walking, not elsewhere classified: Secondary | ICD-10-CM | POA: Diagnosis not present

## 2016-08-07 DIAGNOSIS — R1311 Dysphagia, oral phase: Secondary | ICD-10-CM | POA: Diagnosis not present

## 2016-08-07 DIAGNOSIS — S72301K Unspecified fracture of shaft of right femur, subsequent encounter for closed fracture with nonunion: Secondary | ICD-10-CM | POA: Diagnosis not present

## 2016-08-08 DIAGNOSIS — R1311 Dysphagia, oral phase: Secondary | ICD-10-CM | POA: Diagnosis not present

## 2016-08-08 DIAGNOSIS — R279 Unspecified lack of coordination: Secondary | ICD-10-CM | POA: Diagnosis not present

## 2016-08-08 DIAGNOSIS — S72301K Unspecified fracture of shaft of right femur, subsequent encounter for closed fracture with nonunion: Secondary | ICD-10-CM | POA: Diagnosis not present

## 2016-08-08 DIAGNOSIS — R262 Difficulty in walking, not elsewhere classified: Secondary | ICD-10-CM | POA: Diagnosis not present

## 2016-08-10 DIAGNOSIS — R279 Unspecified lack of coordination: Secondary | ICD-10-CM | POA: Diagnosis not present

## 2016-08-10 DIAGNOSIS — I10 Essential (primary) hypertension: Secondary | ICD-10-CM | POA: Diagnosis not present

## 2016-08-10 DIAGNOSIS — E039 Hypothyroidism, unspecified: Secondary | ICD-10-CM | POA: Diagnosis not present

## 2016-08-10 DIAGNOSIS — J449 Chronic obstructive pulmonary disease, unspecified: Secondary | ICD-10-CM | POA: Diagnosis not present

## 2016-08-10 DIAGNOSIS — R1311 Dysphagia, oral phase: Secondary | ICD-10-CM | POA: Diagnosis not present

## 2016-08-10 DIAGNOSIS — R262 Difficulty in walking, not elsewhere classified: Secondary | ICD-10-CM | POA: Diagnosis not present

## 2016-08-10 DIAGNOSIS — S72301K Unspecified fracture of shaft of right femur, subsequent encounter for closed fracture with nonunion: Secondary | ICD-10-CM | POA: Diagnosis not present

## 2016-08-10 DIAGNOSIS — F0281 Dementia in other diseases classified elsewhere with behavioral disturbance: Secondary | ICD-10-CM | POA: Diagnosis not present

## 2016-08-11 DIAGNOSIS — R279 Unspecified lack of coordination: Secondary | ICD-10-CM | POA: Diagnosis not present

## 2016-08-11 DIAGNOSIS — R1311 Dysphagia, oral phase: Secondary | ICD-10-CM | POA: Diagnosis not present

## 2016-08-11 DIAGNOSIS — R262 Difficulty in walking, not elsewhere classified: Secondary | ICD-10-CM | POA: Diagnosis not present

## 2016-08-11 DIAGNOSIS — S72301K Unspecified fracture of shaft of right femur, subsequent encounter for closed fracture with nonunion: Secondary | ICD-10-CM | POA: Diagnosis not present

## 2016-08-12 DIAGNOSIS — E039 Hypothyroidism, unspecified: Secondary | ICD-10-CM | POA: Diagnosis not present

## 2016-08-12 DIAGNOSIS — R6889 Other general symptoms and signs: Secondary | ICD-10-CM | POA: Diagnosis not present

## 2016-08-12 DIAGNOSIS — S72301K Unspecified fracture of shaft of right femur, subsequent encounter for closed fracture with nonunion: Secondary | ICD-10-CM | POA: Diagnosis not present

## 2016-08-12 DIAGNOSIS — R262 Difficulty in walking, not elsewhere classified: Secondary | ICD-10-CM | POA: Diagnosis not present

## 2016-08-12 DIAGNOSIS — R279 Unspecified lack of coordination: Secondary | ICD-10-CM | POA: Diagnosis not present

## 2016-08-12 DIAGNOSIS — R1311 Dysphagia, oral phase: Secondary | ICD-10-CM | POA: Diagnosis not present

## 2016-08-13 DIAGNOSIS — R1311 Dysphagia, oral phase: Secondary | ICD-10-CM | POA: Diagnosis not present

## 2016-08-13 DIAGNOSIS — S72301K Unspecified fracture of shaft of right femur, subsequent encounter for closed fracture with nonunion: Secondary | ICD-10-CM | POA: Diagnosis not present

## 2016-08-13 DIAGNOSIS — R262 Difficulty in walking, not elsewhere classified: Secondary | ICD-10-CM | POA: Diagnosis not present

## 2016-08-13 DIAGNOSIS — R279 Unspecified lack of coordination: Secondary | ICD-10-CM | POA: Diagnosis not present

## 2016-08-14 DIAGNOSIS — R279 Unspecified lack of coordination: Secondary | ICD-10-CM | POA: Diagnosis not present

## 2016-08-14 DIAGNOSIS — S72301K Unspecified fracture of shaft of right femur, subsequent encounter for closed fracture with nonunion: Secondary | ICD-10-CM | POA: Diagnosis not present

## 2016-08-14 DIAGNOSIS — R262 Difficulty in walking, not elsewhere classified: Secondary | ICD-10-CM | POA: Diagnosis not present

## 2016-08-14 DIAGNOSIS — R1311 Dysphagia, oral phase: Secondary | ICD-10-CM | POA: Diagnosis not present

## 2016-08-17 DIAGNOSIS — R1311 Dysphagia, oral phase: Secondary | ICD-10-CM | POA: Diagnosis not present

## 2016-08-17 DIAGNOSIS — S72301K Unspecified fracture of shaft of right femur, subsequent encounter for closed fracture with nonunion: Secondary | ICD-10-CM | POA: Diagnosis not present

## 2016-08-17 DIAGNOSIS — R262 Difficulty in walking, not elsewhere classified: Secondary | ICD-10-CM | POA: Diagnosis not present

## 2016-08-17 DIAGNOSIS — R279 Unspecified lack of coordination: Secondary | ICD-10-CM | POA: Diagnosis not present

## 2016-08-18 DIAGNOSIS — R1311 Dysphagia, oral phase: Secondary | ICD-10-CM | POA: Diagnosis not present

## 2016-08-18 DIAGNOSIS — R279 Unspecified lack of coordination: Secondary | ICD-10-CM | POA: Diagnosis not present

## 2016-08-18 DIAGNOSIS — S72301K Unspecified fracture of shaft of right femur, subsequent encounter for closed fracture with nonunion: Secondary | ICD-10-CM | POA: Diagnosis not present

## 2016-08-18 DIAGNOSIS — R262 Difficulty in walking, not elsewhere classified: Secondary | ICD-10-CM | POA: Diagnosis not present

## 2016-08-19 DIAGNOSIS — S72301K Unspecified fracture of shaft of right femur, subsequent encounter for closed fracture with nonunion: Secondary | ICD-10-CM | POA: Diagnosis not present

## 2016-08-19 DIAGNOSIS — R1311 Dysphagia, oral phase: Secondary | ICD-10-CM | POA: Diagnosis not present

## 2016-08-19 DIAGNOSIS — R279 Unspecified lack of coordination: Secondary | ICD-10-CM | POA: Diagnosis not present

## 2016-08-19 DIAGNOSIS — R262 Difficulty in walking, not elsewhere classified: Secondary | ICD-10-CM | POA: Diagnosis not present

## 2016-08-20 DIAGNOSIS — R1311 Dysphagia, oral phase: Secondary | ICD-10-CM | POA: Diagnosis not present

## 2016-08-20 DIAGNOSIS — S72301K Unspecified fracture of shaft of right femur, subsequent encounter for closed fracture with nonunion: Secondary | ICD-10-CM | POA: Diagnosis not present

## 2016-08-20 DIAGNOSIS — R262 Difficulty in walking, not elsewhere classified: Secondary | ICD-10-CM | POA: Diagnosis not present

## 2016-08-20 DIAGNOSIS — R279 Unspecified lack of coordination: Secondary | ICD-10-CM | POA: Diagnosis not present

## 2016-08-21 DIAGNOSIS — R262 Difficulty in walking, not elsewhere classified: Secondary | ICD-10-CM | POA: Diagnosis not present

## 2016-08-21 DIAGNOSIS — R1311 Dysphagia, oral phase: Secondary | ICD-10-CM | POA: Diagnosis not present

## 2016-08-21 DIAGNOSIS — S72301K Unspecified fracture of shaft of right femur, subsequent encounter for closed fracture with nonunion: Secondary | ICD-10-CM | POA: Diagnosis not present

## 2016-08-21 DIAGNOSIS — F0391 Unspecified dementia with behavioral disturbance: Secondary | ICD-10-CM | POA: Diagnosis not present

## 2016-08-21 DIAGNOSIS — R279 Unspecified lack of coordination: Secondary | ICD-10-CM | POA: Diagnosis not present

## 2016-08-21 DIAGNOSIS — R569 Unspecified convulsions: Secondary | ICD-10-CM | POA: Diagnosis not present

## 2016-08-24 DIAGNOSIS — S72301K Unspecified fracture of shaft of right femur, subsequent encounter for closed fracture with nonunion: Secondary | ICD-10-CM | POA: Diagnosis not present

## 2016-08-24 DIAGNOSIS — R1311 Dysphagia, oral phase: Secondary | ICD-10-CM | POA: Diagnosis not present

## 2016-08-24 DIAGNOSIS — R279 Unspecified lack of coordination: Secondary | ICD-10-CM | POA: Diagnosis not present

## 2016-08-24 DIAGNOSIS — R262 Difficulty in walking, not elsewhere classified: Secondary | ICD-10-CM | POA: Diagnosis not present

## 2016-08-25 DIAGNOSIS — R279 Unspecified lack of coordination: Secondary | ICD-10-CM | POA: Diagnosis not present

## 2016-08-25 DIAGNOSIS — R1311 Dysphagia, oral phase: Secondary | ICD-10-CM | POA: Diagnosis not present

## 2016-08-25 DIAGNOSIS — R262 Difficulty in walking, not elsewhere classified: Secondary | ICD-10-CM | POA: Diagnosis not present

## 2016-08-25 DIAGNOSIS — S72301K Unspecified fracture of shaft of right femur, subsequent encounter for closed fracture with nonunion: Secondary | ICD-10-CM | POA: Diagnosis not present

## 2016-08-26 DIAGNOSIS — R1311 Dysphagia, oral phase: Secondary | ICD-10-CM | POA: Diagnosis not present

## 2016-08-26 DIAGNOSIS — R279 Unspecified lack of coordination: Secondary | ICD-10-CM | POA: Diagnosis not present

## 2016-08-26 DIAGNOSIS — R262 Difficulty in walking, not elsewhere classified: Secondary | ICD-10-CM | POA: Diagnosis not present

## 2016-08-26 DIAGNOSIS — S72301K Unspecified fracture of shaft of right femur, subsequent encounter for closed fracture with nonunion: Secondary | ICD-10-CM | POA: Diagnosis not present

## 2016-08-27 DIAGNOSIS — S72301K Unspecified fracture of shaft of right femur, subsequent encounter for closed fracture with nonunion: Secondary | ICD-10-CM | POA: Diagnosis not present

## 2016-08-27 DIAGNOSIS — R488 Other symbolic dysfunctions: Secondary | ICD-10-CM | POA: Diagnosis not present

## 2016-08-27 DIAGNOSIS — R1311 Dysphagia, oral phase: Secondary | ICD-10-CM | POA: Diagnosis not present

## 2016-08-27 DIAGNOSIS — R279 Unspecified lack of coordination: Secondary | ICD-10-CM | POA: Diagnosis not present

## 2016-08-27 DIAGNOSIS — R1310 Dysphagia, unspecified: Secondary | ICD-10-CM | POA: Diagnosis not present

## 2016-08-27 DIAGNOSIS — R262 Difficulty in walking, not elsewhere classified: Secondary | ICD-10-CM | POA: Diagnosis not present

## 2016-08-28 DIAGNOSIS — R1311 Dysphagia, oral phase: Secondary | ICD-10-CM | POA: Diagnosis not present

## 2016-08-28 DIAGNOSIS — S72301K Unspecified fracture of shaft of right femur, subsequent encounter for closed fracture with nonunion: Secondary | ICD-10-CM | POA: Diagnosis not present

## 2016-08-28 DIAGNOSIS — R262 Difficulty in walking, not elsewhere classified: Secondary | ICD-10-CM | POA: Diagnosis not present

## 2016-08-28 DIAGNOSIS — R279 Unspecified lack of coordination: Secondary | ICD-10-CM | POA: Diagnosis not present

## 2016-08-31 DIAGNOSIS — S72301K Unspecified fracture of shaft of right femur, subsequent encounter for closed fracture with nonunion: Secondary | ICD-10-CM | POA: Diagnosis not present

## 2016-08-31 DIAGNOSIS — R262 Difficulty in walking, not elsewhere classified: Secondary | ICD-10-CM | POA: Diagnosis not present

## 2016-08-31 DIAGNOSIS — R279 Unspecified lack of coordination: Secondary | ICD-10-CM | POA: Diagnosis not present

## 2016-08-31 DIAGNOSIS — R1311 Dysphagia, oral phase: Secondary | ICD-10-CM | POA: Diagnosis not present

## 2016-09-01 DIAGNOSIS — R279 Unspecified lack of coordination: Secondary | ICD-10-CM | POA: Diagnosis not present

## 2016-09-01 DIAGNOSIS — R1311 Dysphagia, oral phase: Secondary | ICD-10-CM | POA: Diagnosis not present

## 2016-09-01 DIAGNOSIS — S72301K Unspecified fracture of shaft of right femur, subsequent encounter for closed fracture with nonunion: Secondary | ICD-10-CM | POA: Diagnosis not present

## 2016-09-01 DIAGNOSIS — R262 Difficulty in walking, not elsewhere classified: Secondary | ICD-10-CM | POA: Diagnosis not present

## 2016-09-02 DIAGNOSIS — R279 Unspecified lack of coordination: Secondary | ICD-10-CM | POA: Diagnosis not present

## 2016-09-02 DIAGNOSIS — S72301K Unspecified fracture of shaft of right femur, subsequent encounter for closed fracture with nonunion: Secondary | ICD-10-CM | POA: Diagnosis not present

## 2016-09-02 DIAGNOSIS — R262 Difficulty in walking, not elsewhere classified: Secondary | ICD-10-CM | POA: Diagnosis not present

## 2016-09-02 DIAGNOSIS — R1311 Dysphagia, oral phase: Secondary | ICD-10-CM | POA: Diagnosis not present

## 2016-09-03 DIAGNOSIS — R1311 Dysphagia, oral phase: Secondary | ICD-10-CM | POA: Diagnosis not present

## 2016-09-03 DIAGNOSIS — S72301K Unspecified fracture of shaft of right femur, subsequent encounter for closed fracture with nonunion: Secondary | ICD-10-CM | POA: Diagnosis not present

## 2016-09-03 DIAGNOSIS — R262 Difficulty in walking, not elsewhere classified: Secondary | ICD-10-CM | POA: Diagnosis not present

## 2016-09-03 DIAGNOSIS — R279 Unspecified lack of coordination: Secondary | ICD-10-CM | POA: Diagnosis not present

## 2016-09-06 DIAGNOSIS — D649 Anemia, unspecified: Secondary | ICD-10-CM | POA: Diagnosis not present

## 2016-09-06 DIAGNOSIS — I10 Essential (primary) hypertension: Secondary | ICD-10-CM | POA: Diagnosis not present

## 2016-09-06 DIAGNOSIS — E039 Hypothyroidism, unspecified: Secondary | ICD-10-CM | POA: Diagnosis not present

## 2016-09-06 DIAGNOSIS — E119 Type 2 diabetes mellitus without complications: Secondary | ICD-10-CM | POA: Diagnosis not present

## 2016-09-07 DIAGNOSIS — I10 Essential (primary) hypertension: Secondary | ICD-10-CM | POA: Diagnosis not present

## 2016-09-07 DIAGNOSIS — J449 Chronic obstructive pulmonary disease, unspecified: Secondary | ICD-10-CM | POA: Diagnosis not present

## 2016-09-07 DIAGNOSIS — E119 Type 2 diabetes mellitus without complications: Secondary | ICD-10-CM | POA: Diagnosis not present

## 2016-09-07 DIAGNOSIS — E039 Hypothyroidism, unspecified: Secondary | ICD-10-CM | POA: Diagnosis not present

## 2016-09-08 DIAGNOSIS — D649 Anemia, unspecified: Secondary | ICD-10-CM | POA: Diagnosis not present

## 2016-09-08 DIAGNOSIS — F0281 Dementia in other diseases classified elsewhere with behavioral disturbance: Secondary | ICD-10-CM | POA: Diagnosis not present

## 2016-09-08 DIAGNOSIS — E039 Hypothyroidism, unspecified: Secondary | ICD-10-CM | POA: Diagnosis not present

## 2016-09-08 DIAGNOSIS — I10 Essential (primary) hypertension: Secondary | ICD-10-CM | POA: Diagnosis not present

## 2016-09-16 DIAGNOSIS — E109 Type 1 diabetes mellitus without complications: Secondary | ICD-10-CM | POA: Diagnosis not present

## 2016-09-16 DIAGNOSIS — Z79899 Other long term (current) drug therapy: Secondary | ICD-10-CM | POA: Diagnosis not present

## 2016-09-16 DIAGNOSIS — N390 Urinary tract infection, site not specified: Secondary | ICD-10-CM | POA: Diagnosis not present

## 2016-09-16 DIAGNOSIS — E119 Type 2 diabetes mellitus without complications: Secondary | ICD-10-CM | POA: Diagnosis not present

## 2016-09-16 DIAGNOSIS — F0391 Unspecified dementia with behavioral disturbance: Secondary | ICD-10-CM | POA: Diagnosis not present

## 2016-09-16 DIAGNOSIS — R319 Hematuria, unspecified: Secondary | ICD-10-CM | POA: Diagnosis not present

## 2016-09-16 DIAGNOSIS — E039 Hypothyroidism, unspecified: Secondary | ICD-10-CM | POA: Diagnosis not present

## 2016-09-16 DIAGNOSIS — R6889 Other general symptoms and signs: Secondary | ICD-10-CM | POA: Diagnosis not present

## 2016-09-29 DIAGNOSIS — E119 Type 2 diabetes mellitus without complications: Secondary | ICD-10-CM | POA: Diagnosis not present

## 2016-09-29 DIAGNOSIS — R6889 Other general symptoms and signs: Secondary | ICD-10-CM | POA: Diagnosis not present

## 2016-09-29 DIAGNOSIS — R5383 Other fatigue: Secondary | ICD-10-CM | POA: Diagnosis not present

## 2016-09-29 DIAGNOSIS — J156 Pneumonia due to other aerobic Gram-negative bacteria: Secondary | ICD-10-CM | POA: Diagnosis not present

## 2016-09-29 DIAGNOSIS — D649 Anemia, unspecified: Secondary | ICD-10-CM | POA: Diagnosis not present

## 2016-09-29 DIAGNOSIS — R531 Weakness: Secondary | ICD-10-CM | POA: Diagnosis not present

## 2016-09-29 DIAGNOSIS — R05 Cough: Secondary | ICD-10-CM | POA: Diagnosis not present

## 2016-09-30 DIAGNOSIS — R05 Cough: Secondary | ICD-10-CM | POA: Diagnosis not present

## 2016-09-30 DIAGNOSIS — R262 Difficulty in walking, not elsewhere classified: Secondary | ICD-10-CM | POA: Diagnosis not present

## 2016-09-30 DIAGNOSIS — R1311 Dysphagia, oral phase: Secondary | ICD-10-CM | POA: Diagnosis not present

## 2016-09-30 DIAGNOSIS — R279 Unspecified lack of coordination: Secondary | ICD-10-CM | POA: Diagnosis not present

## 2016-09-30 DIAGNOSIS — J156 Pneumonia due to other aerobic Gram-negative bacteria: Secondary | ICD-10-CM | POA: Diagnosis not present

## 2016-09-30 DIAGNOSIS — S72301K Unspecified fracture of shaft of right femur, subsequent encounter for closed fracture with nonunion: Secondary | ICD-10-CM | POA: Diagnosis not present

## 2016-10-01 DIAGNOSIS — S72301K Unspecified fracture of shaft of right femur, subsequent encounter for closed fracture with nonunion: Secondary | ICD-10-CM | POA: Diagnosis not present

## 2016-10-01 DIAGNOSIS — J156 Pneumonia due to other aerobic Gram-negative bacteria: Secondary | ICD-10-CM | POA: Diagnosis not present

## 2016-10-01 DIAGNOSIS — R262 Difficulty in walking, not elsewhere classified: Secondary | ICD-10-CM | POA: Diagnosis not present

## 2016-10-01 DIAGNOSIS — R1311 Dysphagia, oral phase: Secondary | ICD-10-CM | POA: Diagnosis not present

## 2016-10-01 DIAGNOSIS — R279 Unspecified lack of coordination: Secondary | ICD-10-CM | POA: Diagnosis not present

## 2016-10-01 DIAGNOSIS — R0902 Hypoxemia: Secondary | ICD-10-CM | POA: Diagnosis not present

## 2016-10-01 DIAGNOSIS — R05 Cough: Secondary | ICD-10-CM | POA: Diagnosis not present

## 2016-10-02 DIAGNOSIS — S72301K Unspecified fracture of shaft of right femur, subsequent encounter for closed fracture with nonunion: Secondary | ICD-10-CM | POA: Diagnosis not present

## 2016-10-02 DIAGNOSIS — R279 Unspecified lack of coordination: Secondary | ICD-10-CM | POA: Diagnosis not present

## 2016-10-02 DIAGNOSIS — R262 Difficulty in walking, not elsewhere classified: Secondary | ICD-10-CM | POA: Diagnosis not present

## 2016-10-02 DIAGNOSIS — R1311 Dysphagia, oral phase: Secondary | ICD-10-CM | POA: Diagnosis not present

## 2016-10-05 DIAGNOSIS — S72301K Unspecified fracture of shaft of right femur, subsequent encounter for closed fracture with nonunion: Secondary | ICD-10-CM | POA: Diagnosis not present

## 2016-10-05 DIAGNOSIS — J449 Chronic obstructive pulmonary disease, unspecified: Secondary | ICD-10-CM | POA: Diagnosis not present

## 2016-10-05 DIAGNOSIS — R1311 Dysphagia, oral phase: Secondary | ICD-10-CM | POA: Diagnosis not present

## 2016-10-05 DIAGNOSIS — J156 Pneumonia due to other aerobic Gram-negative bacteria: Secondary | ICD-10-CM | POA: Diagnosis not present

## 2016-10-05 DIAGNOSIS — I10 Essential (primary) hypertension: Secondary | ICD-10-CM | POA: Diagnosis not present

## 2016-10-05 DIAGNOSIS — R262 Difficulty in walking, not elsewhere classified: Secondary | ICD-10-CM | POA: Diagnosis not present

## 2016-10-05 DIAGNOSIS — R279 Unspecified lack of coordination: Secondary | ICD-10-CM | POA: Diagnosis not present

## 2016-10-05 DIAGNOSIS — E039 Hypothyroidism, unspecified: Secondary | ICD-10-CM | POA: Diagnosis not present

## 2016-10-06 DIAGNOSIS — R279 Unspecified lack of coordination: Secondary | ICD-10-CM | POA: Diagnosis not present

## 2016-10-06 DIAGNOSIS — R1311 Dysphagia, oral phase: Secondary | ICD-10-CM | POA: Diagnosis not present

## 2016-10-06 DIAGNOSIS — A0472 Enterocolitis due to Clostridium difficile, not specified as recurrent: Secondary | ICD-10-CM | POA: Diagnosis not present

## 2016-10-06 DIAGNOSIS — S72301K Unspecified fracture of shaft of right femur, subsequent encounter for closed fracture with nonunion: Secondary | ICD-10-CM | POA: Diagnosis not present

## 2016-10-06 DIAGNOSIS — R262 Difficulty in walking, not elsewhere classified: Secondary | ICD-10-CM | POA: Diagnosis not present

## 2016-10-07 DIAGNOSIS — S72301K Unspecified fracture of shaft of right femur, subsequent encounter for closed fracture with nonunion: Secondary | ICD-10-CM | POA: Diagnosis not present

## 2016-10-07 DIAGNOSIS — R1311 Dysphagia, oral phase: Secondary | ICD-10-CM | POA: Diagnosis not present

## 2016-10-07 DIAGNOSIS — R279 Unspecified lack of coordination: Secondary | ICD-10-CM | POA: Diagnosis not present

## 2016-10-07 DIAGNOSIS — R262 Difficulty in walking, not elsewhere classified: Secondary | ICD-10-CM | POA: Diagnosis not present

## 2016-10-08 DIAGNOSIS — R262 Difficulty in walking, not elsewhere classified: Secondary | ICD-10-CM | POA: Diagnosis not present

## 2016-10-08 DIAGNOSIS — S72301K Unspecified fracture of shaft of right femur, subsequent encounter for closed fracture with nonunion: Secondary | ICD-10-CM | POA: Diagnosis not present

## 2016-10-08 DIAGNOSIS — R279 Unspecified lack of coordination: Secondary | ICD-10-CM | POA: Diagnosis not present

## 2016-10-08 DIAGNOSIS — R1311 Dysphagia, oral phase: Secondary | ICD-10-CM | POA: Diagnosis not present

## 2016-10-08 DIAGNOSIS — E119 Type 2 diabetes mellitus without complications: Secondary | ICD-10-CM | POA: Diagnosis not present

## 2016-10-08 DIAGNOSIS — E785 Hyperlipidemia, unspecified: Secondary | ICD-10-CM | POA: Diagnosis not present

## 2016-10-08 DIAGNOSIS — I10 Essential (primary) hypertension: Secondary | ICD-10-CM | POA: Diagnosis not present

## 2016-10-08 DIAGNOSIS — D649 Anemia, unspecified: Secondary | ICD-10-CM | POA: Diagnosis not present

## 2016-10-09 DIAGNOSIS — R262 Difficulty in walking, not elsewhere classified: Secondary | ICD-10-CM | POA: Diagnosis not present

## 2016-10-09 DIAGNOSIS — R279 Unspecified lack of coordination: Secondary | ICD-10-CM | POA: Diagnosis not present

## 2016-10-09 DIAGNOSIS — S72301K Unspecified fracture of shaft of right femur, subsequent encounter for closed fracture with nonunion: Secondary | ICD-10-CM | POA: Diagnosis not present

## 2016-10-09 DIAGNOSIS — R1311 Dysphagia, oral phase: Secondary | ICD-10-CM | POA: Diagnosis not present

## 2016-10-09 DIAGNOSIS — R6889 Other general symptoms and signs: Secondary | ICD-10-CM | POA: Diagnosis not present

## 2016-10-09 DIAGNOSIS — D649 Anemia, unspecified: Secondary | ICD-10-CM | POA: Diagnosis not present

## 2016-10-12 DIAGNOSIS — R262 Difficulty in walking, not elsewhere classified: Secondary | ICD-10-CM | POA: Diagnosis not present

## 2016-10-12 DIAGNOSIS — S72301K Unspecified fracture of shaft of right femur, subsequent encounter for closed fracture with nonunion: Secondary | ICD-10-CM | POA: Diagnosis not present

## 2016-10-12 DIAGNOSIS — R279 Unspecified lack of coordination: Secondary | ICD-10-CM | POA: Diagnosis not present

## 2016-10-12 DIAGNOSIS — R1311 Dysphagia, oral phase: Secondary | ICD-10-CM | POA: Diagnosis not present

## 2016-10-13 DIAGNOSIS — S72301K Unspecified fracture of shaft of right femur, subsequent encounter for closed fracture with nonunion: Secondary | ICD-10-CM | POA: Diagnosis not present

## 2016-10-13 DIAGNOSIS — R262 Difficulty in walking, not elsewhere classified: Secondary | ICD-10-CM | POA: Diagnosis not present

## 2016-10-13 DIAGNOSIS — R279 Unspecified lack of coordination: Secondary | ICD-10-CM | POA: Diagnosis not present

## 2016-10-13 DIAGNOSIS — R1311 Dysphagia, oral phase: Secondary | ICD-10-CM | POA: Diagnosis not present

## 2016-10-23 DIAGNOSIS — F411 Generalized anxiety disorder: Secondary | ICD-10-CM | POA: Diagnosis not present

## 2016-10-23 DIAGNOSIS — F0281 Dementia in other diseases classified elsewhere with behavioral disturbance: Secondary | ICD-10-CM | POA: Diagnosis not present

## 2016-10-23 DIAGNOSIS — F321 Major depressive disorder, single episode, moderate: Secondary | ICD-10-CM | POA: Diagnosis not present

## 2016-11-02 DIAGNOSIS — I10 Essential (primary) hypertension: Secondary | ICD-10-CM | POA: Diagnosis not present

## 2016-11-02 DIAGNOSIS — I482 Chronic atrial fibrillation: Secondary | ICD-10-CM | POA: Diagnosis not present

## 2016-11-02 DIAGNOSIS — J449 Chronic obstructive pulmonary disease, unspecified: Secondary | ICD-10-CM | POA: Diagnosis not present

## 2016-11-02 DIAGNOSIS — E119 Type 2 diabetes mellitus without complications: Secondary | ICD-10-CM | POA: Diagnosis not present

## 2016-11-03 DIAGNOSIS — E039 Hypothyroidism, unspecified: Secondary | ICD-10-CM | POA: Diagnosis not present

## 2016-11-03 DIAGNOSIS — E109 Type 1 diabetes mellitus without complications: Secondary | ICD-10-CM | POA: Diagnosis not present

## 2016-11-03 DIAGNOSIS — E119 Type 2 diabetes mellitus without complications: Secondary | ICD-10-CM | POA: Diagnosis not present

## 2016-11-03 DIAGNOSIS — E559 Vitamin D deficiency, unspecified: Secondary | ICD-10-CM | POA: Diagnosis not present

## 2016-11-03 DIAGNOSIS — R569 Unspecified convulsions: Secondary | ICD-10-CM | POA: Diagnosis not present

## 2016-11-03 DIAGNOSIS — D649 Anemia, unspecified: Secondary | ICD-10-CM | POA: Diagnosis not present

## 2016-11-03 DIAGNOSIS — E785 Hyperlipidemia, unspecified: Secondary | ICD-10-CM | POA: Diagnosis not present

## 2016-11-13 DIAGNOSIS — F0281 Dementia in other diseases classified elsewhere with behavioral disturbance: Secondary | ICD-10-CM | POA: Diagnosis not present

## 2016-11-13 DIAGNOSIS — F321 Major depressive disorder, single episode, moderate: Secondary | ICD-10-CM | POA: Diagnosis not present

## 2016-11-13 DIAGNOSIS — F411 Generalized anxiety disorder: Secondary | ICD-10-CM | POA: Diagnosis not present

## 2016-11-30 DIAGNOSIS — I482 Chronic atrial fibrillation: Secondary | ICD-10-CM | POA: Diagnosis not present

## 2016-11-30 DIAGNOSIS — J449 Chronic obstructive pulmonary disease, unspecified: Secondary | ICD-10-CM | POA: Diagnosis not present

## 2016-11-30 DIAGNOSIS — E039 Hypothyroidism, unspecified: Secondary | ICD-10-CM | POA: Diagnosis not present

## 2016-11-30 DIAGNOSIS — I10 Essential (primary) hypertension: Secondary | ICD-10-CM | POA: Diagnosis not present

## 2016-12-06 DIAGNOSIS — E039 Hypothyroidism, unspecified: Secondary | ICD-10-CM | POA: Diagnosis not present

## 2016-12-06 DIAGNOSIS — I10 Essential (primary) hypertension: Secondary | ICD-10-CM | POA: Diagnosis not present

## 2016-12-06 DIAGNOSIS — E119 Type 2 diabetes mellitus without complications: Secondary | ICD-10-CM | POA: Diagnosis not present

## 2016-12-06 DIAGNOSIS — M6281 Muscle weakness (generalized): Secondary | ICD-10-CM | POA: Diagnosis not present

## 2016-12-11 DIAGNOSIS — F0281 Dementia in other diseases classified elsewhere with behavioral disturbance: Secondary | ICD-10-CM | POA: Diagnosis not present

## 2016-12-11 DIAGNOSIS — F321 Major depressive disorder, single episode, moderate: Secondary | ICD-10-CM | POA: Diagnosis not present

## 2016-12-11 DIAGNOSIS — F411 Generalized anxiety disorder: Secondary | ICD-10-CM | POA: Diagnosis not present

## 2016-12-12 DIAGNOSIS — F0391 Unspecified dementia with behavioral disturbance: Secondary | ICD-10-CM | POA: Diagnosis not present

## 2016-12-12 DIAGNOSIS — R569 Unspecified convulsions: Secondary | ICD-10-CM | POA: Diagnosis not present

## 2016-12-16 DIAGNOSIS — N750 Cyst of Bartholin's gland: Secondary | ICD-10-CM | POA: Diagnosis not present

## 2016-12-31 DIAGNOSIS — I10 Essential (primary) hypertension: Secondary | ICD-10-CM | POA: Diagnosis not present

## 2016-12-31 DIAGNOSIS — E039 Hypothyroidism, unspecified: Secondary | ICD-10-CM | POA: Diagnosis not present

## 2016-12-31 DIAGNOSIS — J449 Chronic obstructive pulmonary disease, unspecified: Secondary | ICD-10-CM | POA: Diagnosis not present

## 2016-12-31 DIAGNOSIS — I482 Chronic atrial fibrillation: Secondary | ICD-10-CM | POA: Diagnosis not present

## 2017-01-13 DIAGNOSIS — I517 Cardiomegaly: Secondary | ICD-10-CM | POA: Diagnosis not present

## 2017-01-13 DIAGNOSIS — R05 Cough: Secondary | ICD-10-CM | POA: Diagnosis not present

## 2017-01-14 DIAGNOSIS — M6281 Muscle weakness (generalized): Secondary | ICD-10-CM | POA: Diagnosis not present

## 2017-01-14 DIAGNOSIS — I10 Essential (primary) hypertension: Secondary | ICD-10-CM | POA: Diagnosis not present

## 2017-01-14 DIAGNOSIS — E119 Type 2 diabetes mellitus without complications: Secondary | ICD-10-CM | POA: Diagnosis not present

## 2017-01-22 DIAGNOSIS — R1311 Dysphagia, oral phase: Secondary | ICD-10-CM | POA: Diagnosis not present

## 2017-01-22 DIAGNOSIS — R279 Unspecified lack of coordination: Secondary | ICD-10-CM | POA: Diagnosis not present

## 2017-01-22 DIAGNOSIS — R262 Difficulty in walking, not elsewhere classified: Secondary | ICD-10-CM | POA: Diagnosis not present

## 2017-01-22 DIAGNOSIS — S72301K Unspecified fracture of shaft of right femur, subsequent encounter for closed fracture with nonunion: Secondary | ICD-10-CM | POA: Diagnosis not present

## 2017-01-23 DIAGNOSIS — S72301K Unspecified fracture of shaft of right femur, subsequent encounter for closed fracture with nonunion: Secondary | ICD-10-CM | POA: Diagnosis not present

## 2017-01-23 DIAGNOSIS — R1311 Dysphagia, oral phase: Secondary | ICD-10-CM | POA: Diagnosis not present

## 2017-01-23 DIAGNOSIS — R279 Unspecified lack of coordination: Secondary | ICD-10-CM | POA: Diagnosis not present

## 2017-01-23 DIAGNOSIS — R262 Difficulty in walking, not elsewhere classified: Secondary | ICD-10-CM | POA: Diagnosis not present

## 2017-01-25 DIAGNOSIS — R279 Unspecified lack of coordination: Secondary | ICD-10-CM | POA: Diagnosis not present

## 2017-01-25 DIAGNOSIS — R1311 Dysphagia, oral phase: Secondary | ICD-10-CM | POA: Diagnosis not present

## 2017-01-25 DIAGNOSIS — R262 Difficulty in walking, not elsewhere classified: Secondary | ICD-10-CM | POA: Diagnosis not present

## 2017-01-25 DIAGNOSIS — S72301K Unspecified fracture of shaft of right femur, subsequent encounter for closed fracture with nonunion: Secondary | ICD-10-CM | POA: Diagnosis not present

## 2017-01-26 DIAGNOSIS — E1140 Type 2 diabetes mellitus with diabetic neuropathy, unspecified: Secondary | ICD-10-CM | POA: Diagnosis not present

## 2017-01-26 DIAGNOSIS — Z794 Long term (current) use of insulin: Secondary | ICD-10-CM | POA: Diagnosis not present

## 2017-01-26 DIAGNOSIS — R279 Unspecified lack of coordination: Secondary | ICD-10-CM | POA: Diagnosis not present

## 2017-01-26 DIAGNOSIS — R262 Difficulty in walking, not elsewhere classified: Secondary | ICD-10-CM | POA: Diagnosis not present

## 2017-01-26 DIAGNOSIS — S72301K Unspecified fracture of shaft of right femur, subsequent encounter for closed fracture with nonunion: Secondary | ICD-10-CM | POA: Diagnosis not present

## 2017-01-26 DIAGNOSIS — R1311 Dysphagia, oral phase: Secondary | ICD-10-CM | POA: Diagnosis not present

## 2017-01-26 DIAGNOSIS — B351 Tinea unguium: Secondary | ICD-10-CM | POA: Diagnosis not present

## 2017-01-27 DIAGNOSIS — R1311 Dysphagia, oral phase: Secondary | ICD-10-CM | POA: Diagnosis not present

## 2017-01-27 DIAGNOSIS — S72301K Unspecified fracture of shaft of right femur, subsequent encounter for closed fracture with nonunion: Secondary | ICD-10-CM | POA: Diagnosis not present

## 2017-01-27 DIAGNOSIS — Z23 Encounter for immunization: Secondary | ICD-10-CM | POA: Diagnosis not present

## 2017-01-27 DIAGNOSIS — R262 Difficulty in walking, not elsewhere classified: Secondary | ICD-10-CM | POA: Diagnosis not present

## 2017-01-27 DIAGNOSIS — R279 Unspecified lack of coordination: Secondary | ICD-10-CM | POA: Diagnosis not present

## 2017-01-28 DIAGNOSIS — R262 Difficulty in walking, not elsewhere classified: Secondary | ICD-10-CM | POA: Diagnosis not present

## 2017-01-28 DIAGNOSIS — J449 Chronic obstructive pulmonary disease, unspecified: Secondary | ICD-10-CM | POA: Diagnosis not present

## 2017-01-28 DIAGNOSIS — R279 Unspecified lack of coordination: Secondary | ICD-10-CM | POA: Diagnosis not present

## 2017-01-28 DIAGNOSIS — E119 Type 2 diabetes mellitus without complications: Secondary | ICD-10-CM | POA: Diagnosis not present

## 2017-01-28 DIAGNOSIS — S72301K Unspecified fracture of shaft of right femur, subsequent encounter for closed fracture with nonunion: Secondary | ICD-10-CM | POA: Diagnosis not present

## 2017-01-28 DIAGNOSIS — E039 Hypothyroidism, unspecified: Secondary | ICD-10-CM | POA: Diagnosis not present

## 2017-01-28 DIAGNOSIS — R1311 Dysphagia, oral phase: Secondary | ICD-10-CM | POA: Diagnosis not present

## 2017-01-28 DIAGNOSIS — I10 Essential (primary) hypertension: Secondary | ICD-10-CM | POA: Diagnosis not present

## 2017-01-29 DIAGNOSIS — R1311 Dysphagia, oral phase: Secondary | ICD-10-CM | POA: Diagnosis not present

## 2017-01-29 DIAGNOSIS — R279 Unspecified lack of coordination: Secondary | ICD-10-CM | POA: Diagnosis not present

## 2017-01-29 DIAGNOSIS — E559 Vitamin D deficiency, unspecified: Secondary | ICD-10-CM | POA: Diagnosis not present

## 2017-01-29 DIAGNOSIS — E039 Hypothyroidism, unspecified: Secondary | ICD-10-CM | POA: Diagnosis not present

## 2017-01-29 DIAGNOSIS — S72301K Unspecified fracture of shaft of right femur, subsequent encounter for closed fracture with nonunion: Secondary | ICD-10-CM | POA: Diagnosis not present

## 2017-01-29 DIAGNOSIS — R262 Difficulty in walking, not elsewhere classified: Secondary | ICD-10-CM | POA: Diagnosis not present

## 2017-01-29 DIAGNOSIS — E119 Type 2 diabetes mellitus without complications: Secondary | ICD-10-CM | POA: Diagnosis not present

## 2017-01-29 DIAGNOSIS — E785 Hyperlipidemia, unspecified: Secondary | ICD-10-CM | POA: Diagnosis not present

## 2017-01-29 DIAGNOSIS — R6889 Other general symptoms and signs: Secondary | ICD-10-CM | POA: Diagnosis not present

## 2017-01-29 DIAGNOSIS — D649 Anemia, unspecified: Secondary | ICD-10-CM | POA: Diagnosis not present

## 2017-02-01 DIAGNOSIS — R279 Unspecified lack of coordination: Secondary | ICD-10-CM | POA: Diagnosis not present

## 2017-02-01 DIAGNOSIS — R1311 Dysphagia, oral phase: Secondary | ICD-10-CM | POA: Diagnosis not present

## 2017-02-01 DIAGNOSIS — S72301K Unspecified fracture of shaft of right femur, subsequent encounter for closed fracture with nonunion: Secondary | ICD-10-CM | POA: Diagnosis not present

## 2017-02-01 DIAGNOSIS — R262 Difficulty in walking, not elsewhere classified: Secondary | ICD-10-CM | POA: Diagnosis not present

## 2017-02-02 DIAGNOSIS — R279 Unspecified lack of coordination: Secondary | ICD-10-CM | POA: Diagnosis not present

## 2017-02-02 DIAGNOSIS — R1311 Dysphagia, oral phase: Secondary | ICD-10-CM | POA: Diagnosis not present

## 2017-02-02 DIAGNOSIS — S72301K Unspecified fracture of shaft of right femur, subsequent encounter for closed fracture with nonunion: Secondary | ICD-10-CM | POA: Diagnosis not present

## 2017-02-02 DIAGNOSIS — R262 Difficulty in walking, not elsewhere classified: Secondary | ICD-10-CM | POA: Diagnosis not present

## 2017-02-03 DIAGNOSIS — R279 Unspecified lack of coordination: Secondary | ICD-10-CM | POA: Diagnosis not present

## 2017-02-03 DIAGNOSIS — R1311 Dysphagia, oral phase: Secondary | ICD-10-CM | POA: Diagnosis not present

## 2017-02-03 DIAGNOSIS — S72301K Unspecified fracture of shaft of right femur, subsequent encounter for closed fracture with nonunion: Secondary | ICD-10-CM | POA: Diagnosis not present

## 2017-02-03 DIAGNOSIS — R262 Difficulty in walking, not elsewhere classified: Secondary | ICD-10-CM | POA: Diagnosis not present

## 2017-02-04 DIAGNOSIS — R262 Difficulty in walking, not elsewhere classified: Secondary | ICD-10-CM | POA: Diagnosis not present

## 2017-02-04 DIAGNOSIS — R279 Unspecified lack of coordination: Secondary | ICD-10-CM | POA: Diagnosis not present

## 2017-02-04 DIAGNOSIS — S72301K Unspecified fracture of shaft of right femur, subsequent encounter for closed fracture with nonunion: Secondary | ICD-10-CM | POA: Diagnosis not present

## 2017-02-04 DIAGNOSIS — R1311 Dysphagia, oral phase: Secondary | ICD-10-CM | POA: Diagnosis not present

## 2017-02-06 DIAGNOSIS — S72301K Unspecified fracture of shaft of right femur, subsequent encounter for closed fracture with nonunion: Secondary | ICD-10-CM | POA: Diagnosis not present

## 2017-02-06 DIAGNOSIS — R262 Difficulty in walking, not elsewhere classified: Secondary | ICD-10-CM | POA: Diagnosis not present

## 2017-02-06 DIAGNOSIS — R279 Unspecified lack of coordination: Secondary | ICD-10-CM | POA: Diagnosis not present

## 2017-02-06 DIAGNOSIS — R1311 Dysphagia, oral phase: Secondary | ICD-10-CM | POA: Diagnosis not present

## 2017-02-08 DIAGNOSIS — I10 Essential (primary) hypertension: Secondary | ICD-10-CM | POA: Diagnosis not present

## 2017-02-08 DIAGNOSIS — R262 Difficulty in walking, not elsewhere classified: Secondary | ICD-10-CM | POA: Diagnosis not present

## 2017-02-08 DIAGNOSIS — D649 Anemia, unspecified: Secondary | ICD-10-CM | POA: Diagnosis not present

## 2017-02-08 DIAGNOSIS — S72301K Unspecified fracture of shaft of right femur, subsequent encounter for closed fracture with nonunion: Secondary | ICD-10-CM | POA: Diagnosis not present

## 2017-02-08 DIAGNOSIS — R279 Unspecified lack of coordination: Secondary | ICD-10-CM | POA: Diagnosis not present

## 2017-02-08 DIAGNOSIS — R1311 Dysphagia, oral phase: Secondary | ICD-10-CM | POA: Diagnosis not present

## 2017-02-08 DIAGNOSIS — R0902 Hypoxemia: Secondary | ICD-10-CM | POA: Diagnosis not present

## 2017-02-08 DIAGNOSIS — R0602 Shortness of breath: Secondary | ICD-10-CM | POA: Diagnosis not present

## 2017-02-09 DIAGNOSIS — R062 Wheezing: Secondary | ICD-10-CM | POA: Diagnosis not present

## 2017-02-09 DIAGNOSIS — R279 Unspecified lack of coordination: Secondary | ICD-10-CM | POA: Diagnosis not present

## 2017-02-09 DIAGNOSIS — J156 Pneumonia due to other aerobic Gram-negative bacteria: Secondary | ICD-10-CM | POA: Diagnosis not present

## 2017-02-09 DIAGNOSIS — S72301K Unspecified fracture of shaft of right femur, subsequent encounter for closed fracture with nonunion: Secondary | ICD-10-CM | POA: Diagnosis not present

## 2017-02-09 DIAGNOSIS — R1311 Dysphagia, oral phase: Secondary | ICD-10-CM | POA: Diagnosis not present

## 2017-02-09 DIAGNOSIS — R262 Difficulty in walking, not elsewhere classified: Secondary | ICD-10-CM | POA: Diagnosis not present

## 2017-02-10 DIAGNOSIS — R1311 Dysphagia, oral phase: Secondary | ICD-10-CM | POA: Diagnosis not present

## 2017-02-10 DIAGNOSIS — R262 Difficulty in walking, not elsewhere classified: Secondary | ICD-10-CM | POA: Diagnosis not present

## 2017-02-10 DIAGNOSIS — S72301K Unspecified fracture of shaft of right femur, subsequent encounter for closed fracture with nonunion: Secondary | ICD-10-CM | POA: Diagnosis not present

## 2017-02-10 DIAGNOSIS — R279 Unspecified lack of coordination: Secondary | ICD-10-CM | POA: Diagnosis not present

## 2017-02-11 DIAGNOSIS — R279 Unspecified lack of coordination: Secondary | ICD-10-CM | POA: Diagnosis not present

## 2017-02-11 DIAGNOSIS — S72301K Unspecified fracture of shaft of right femur, subsequent encounter for closed fracture with nonunion: Secondary | ICD-10-CM | POA: Diagnosis not present

## 2017-02-11 DIAGNOSIS — R262 Difficulty in walking, not elsewhere classified: Secondary | ICD-10-CM | POA: Diagnosis not present

## 2017-02-11 DIAGNOSIS — R1311 Dysphagia, oral phase: Secondary | ICD-10-CM | POA: Diagnosis not present

## 2017-02-13 DIAGNOSIS — R1311 Dysphagia, oral phase: Secondary | ICD-10-CM | POA: Diagnosis not present

## 2017-02-13 DIAGNOSIS — D649 Anemia, unspecified: Secondary | ICD-10-CM | POA: Diagnosis not present

## 2017-02-13 DIAGNOSIS — R262 Difficulty in walking, not elsewhere classified: Secondary | ICD-10-CM | POA: Diagnosis not present

## 2017-02-13 DIAGNOSIS — R279 Unspecified lack of coordination: Secondary | ICD-10-CM | POA: Diagnosis not present

## 2017-02-13 DIAGNOSIS — S72301K Unspecified fracture of shaft of right femur, subsequent encounter for closed fracture with nonunion: Secondary | ICD-10-CM | POA: Diagnosis not present

## 2017-02-13 DIAGNOSIS — E039 Hypothyroidism, unspecified: Secondary | ICD-10-CM | POA: Diagnosis not present

## 2017-02-13 DIAGNOSIS — I10 Essential (primary) hypertension: Secondary | ICD-10-CM | POA: Diagnosis not present

## 2017-02-13 DIAGNOSIS — E785 Hyperlipidemia, unspecified: Secondary | ICD-10-CM | POA: Diagnosis not present

## 2017-02-15 DIAGNOSIS — R1311 Dysphagia, oral phase: Secondary | ICD-10-CM | POA: Diagnosis not present

## 2017-02-15 DIAGNOSIS — R262 Difficulty in walking, not elsewhere classified: Secondary | ICD-10-CM | POA: Diagnosis not present

## 2017-02-15 DIAGNOSIS — S72301K Unspecified fracture of shaft of right femur, subsequent encounter for closed fracture with nonunion: Secondary | ICD-10-CM | POA: Diagnosis not present

## 2017-02-15 DIAGNOSIS — R279 Unspecified lack of coordination: Secondary | ICD-10-CM | POA: Diagnosis not present

## 2017-02-16 DIAGNOSIS — R1311 Dysphagia, oral phase: Secondary | ICD-10-CM | POA: Diagnosis not present

## 2017-02-16 DIAGNOSIS — R262 Difficulty in walking, not elsewhere classified: Secondary | ICD-10-CM | POA: Diagnosis not present

## 2017-02-16 DIAGNOSIS — S72301K Unspecified fracture of shaft of right femur, subsequent encounter for closed fracture with nonunion: Secondary | ICD-10-CM | POA: Diagnosis not present

## 2017-02-16 DIAGNOSIS — R279 Unspecified lack of coordination: Secondary | ICD-10-CM | POA: Diagnosis not present

## 2017-02-17 DIAGNOSIS — R262 Difficulty in walking, not elsewhere classified: Secondary | ICD-10-CM | POA: Diagnosis not present

## 2017-02-17 DIAGNOSIS — R1311 Dysphagia, oral phase: Secondary | ICD-10-CM | POA: Diagnosis not present

## 2017-02-17 DIAGNOSIS — R279 Unspecified lack of coordination: Secondary | ICD-10-CM | POA: Diagnosis not present

## 2017-02-17 DIAGNOSIS — S72301K Unspecified fracture of shaft of right femur, subsequent encounter for closed fracture with nonunion: Secondary | ICD-10-CM | POA: Diagnosis not present

## 2017-02-19 DIAGNOSIS — S72301K Unspecified fracture of shaft of right femur, subsequent encounter for closed fracture with nonunion: Secondary | ICD-10-CM | POA: Diagnosis not present

## 2017-02-19 DIAGNOSIS — R1311 Dysphagia, oral phase: Secondary | ICD-10-CM | POA: Diagnosis not present

## 2017-02-19 DIAGNOSIS — R262 Difficulty in walking, not elsewhere classified: Secondary | ICD-10-CM | POA: Diagnosis not present

## 2017-02-19 DIAGNOSIS — R279 Unspecified lack of coordination: Secondary | ICD-10-CM | POA: Diagnosis not present

## 2017-02-20 DIAGNOSIS — R1311 Dysphagia, oral phase: Secondary | ICD-10-CM | POA: Diagnosis not present

## 2017-02-20 DIAGNOSIS — R279 Unspecified lack of coordination: Secondary | ICD-10-CM | POA: Diagnosis not present

## 2017-02-20 DIAGNOSIS — R262 Difficulty in walking, not elsewhere classified: Secondary | ICD-10-CM | POA: Diagnosis not present

## 2017-02-20 DIAGNOSIS — S72301K Unspecified fracture of shaft of right femur, subsequent encounter for closed fracture with nonunion: Secondary | ICD-10-CM | POA: Diagnosis not present

## 2017-02-25 DIAGNOSIS — E785 Hyperlipidemia, unspecified: Secondary | ICD-10-CM | POA: Diagnosis not present

## 2017-02-25 DIAGNOSIS — J449 Chronic obstructive pulmonary disease, unspecified: Secondary | ICD-10-CM | POA: Diagnosis not present

## 2017-02-25 DIAGNOSIS — E039 Hypothyroidism, unspecified: Secondary | ICD-10-CM | POA: Diagnosis not present

## 2017-02-25 DIAGNOSIS — I10 Essential (primary) hypertension: Secondary | ICD-10-CM | POA: Diagnosis not present

## 2017-02-26 DIAGNOSIS — F321 Major depressive disorder, single episode, moderate: Secondary | ICD-10-CM | POA: Diagnosis not present

## 2017-02-26 DIAGNOSIS — F0281 Dementia in other diseases classified elsewhere with behavioral disturbance: Secondary | ICD-10-CM | POA: Diagnosis not present

## 2017-02-26 DIAGNOSIS — F411 Generalized anxiety disorder: Secondary | ICD-10-CM | POA: Diagnosis not present

## 2017-03-01 DIAGNOSIS — D649 Anemia, unspecified: Secondary | ICD-10-CM | POA: Diagnosis not present

## 2017-03-01 DIAGNOSIS — I10 Essential (primary) hypertension: Secondary | ICD-10-CM | POA: Diagnosis not present

## 2017-03-01 DIAGNOSIS — F0391 Unspecified dementia with behavioral disturbance: Secondary | ICD-10-CM | POA: Diagnosis not present

## 2017-03-01 DIAGNOSIS — E119 Type 2 diabetes mellitus without complications: Secondary | ICD-10-CM | POA: Diagnosis not present

## 2017-03-01 DIAGNOSIS — E785 Hyperlipidemia, unspecified: Secondary | ICD-10-CM | POA: Diagnosis not present

## 2017-03-03 DIAGNOSIS — E119 Type 2 diabetes mellitus without complications: Secondary | ICD-10-CM | POA: Diagnosis not present

## 2017-03-03 DIAGNOSIS — D649 Anemia, unspecified: Secondary | ICD-10-CM | POA: Diagnosis not present

## 2017-03-04 DIAGNOSIS — I517 Cardiomegaly: Secondary | ICD-10-CM | POA: Diagnosis not present

## 2017-03-04 DIAGNOSIS — E119 Type 2 diabetes mellitus without complications: Secondary | ICD-10-CM | POA: Diagnosis not present

## 2017-03-04 DIAGNOSIS — E039 Hypothyroidism, unspecified: Secondary | ICD-10-CM | POA: Diagnosis not present

## 2017-03-04 DIAGNOSIS — F0281 Dementia in other diseases classified elsewhere with behavioral disturbance: Secondary | ICD-10-CM | POA: Diagnosis not present

## 2017-03-04 DIAGNOSIS — R05 Cough: Secondary | ICD-10-CM | POA: Diagnosis not present

## 2017-03-04 DIAGNOSIS — K625 Hemorrhage of anus and rectum: Secondary | ICD-10-CM | POA: Diagnosis not present

## 2017-03-04 DIAGNOSIS — F0391 Unspecified dementia with behavioral disturbance: Secondary | ICD-10-CM | POA: Diagnosis not present

## 2017-03-04 DIAGNOSIS — R5383 Other fatigue: Secondary | ICD-10-CM | POA: Diagnosis not present

## 2017-03-04 DIAGNOSIS — R319 Hematuria, unspecified: Secondary | ICD-10-CM | POA: Diagnosis not present

## 2017-03-04 DIAGNOSIS — I482 Chronic atrial fibrillation: Secondary | ICD-10-CM | POA: Diagnosis not present

## 2017-03-04 DIAGNOSIS — E109 Type 1 diabetes mellitus without complications: Secondary | ICD-10-CM | POA: Diagnosis not present

## 2017-03-04 DIAGNOSIS — N390 Urinary tract infection, site not specified: Secondary | ICD-10-CM | POA: Diagnosis not present

## 2017-03-07 DIAGNOSIS — J449 Chronic obstructive pulmonary disease, unspecified: Secondary | ICD-10-CM | POA: Diagnosis not present

## 2017-03-07 DIAGNOSIS — E119 Type 2 diabetes mellitus without complications: Secondary | ICD-10-CM | POA: Diagnosis not present

## 2017-03-07 DIAGNOSIS — M25561 Pain in right knee: Secondary | ICD-10-CM | POA: Diagnosis not present

## 2017-03-07 DIAGNOSIS — Z9181 History of falling: Secondary | ICD-10-CM | POA: Diagnosis not present

## 2017-03-07 DIAGNOSIS — M6281 Muscle weakness (generalized): Secondary | ICD-10-CM | POA: Diagnosis not present

## 2017-03-07 DIAGNOSIS — E039 Hypothyroidism, unspecified: Secondary | ICD-10-CM | POA: Diagnosis not present

## 2017-03-07 DIAGNOSIS — F419 Anxiety disorder, unspecified: Secondary | ICD-10-CM | POA: Diagnosis not present

## 2017-03-07 DIAGNOSIS — R262 Difficulty in walking, not elsewhere classified: Secondary | ICD-10-CM | POA: Diagnosis not present

## 2017-03-07 DIAGNOSIS — R634 Abnormal weight loss: Secondary | ICD-10-CM | POA: Diagnosis not present

## 2017-03-07 DIAGNOSIS — I509 Heart failure, unspecified: Secondary | ICD-10-CM | POA: Diagnosis not present

## 2017-03-07 DIAGNOSIS — I10 Essential (primary) hypertension: Secondary | ICD-10-CM | POA: Diagnosis not present

## 2017-03-07 DIAGNOSIS — F0281 Dementia in other diseases classified elsewhere with behavioral disturbance: Secondary | ICD-10-CM | POA: Diagnosis not present

## 2017-03-07 DIAGNOSIS — F339 Major depressive disorder, recurrent, unspecified: Secondary | ICD-10-CM | POA: Diagnosis not present

## 2017-03-07 DIAGNOSIS — R0602 Shortness of breath: Secondary | ICD-10-CM | POA: Diagnosis not present

## 2017-03-07 DIAGNOSIS — H353190 Nonexudative age-related macular degeneration, unspecified eye, stage unspecified: Secondary | ICD-10-CM | POA: Diagnosis not present

## 2017-03-07 DIAGNOSIS — N3000 Acute cystitis without hematuria: Secondary | ICD-10-CM | POA: Diagnosis not present

## 2017-03-07 DIAGNOSIS — R1311 Dysphagia, oral phase: Secondary | ICD-10-CM | POA: Diagnosis not present

## 2017-03-07 DIAGNOSIS — E785 Hyperlipidemia, unspecified: Secondary | ICD-10-CM | POA: Diagnosis not present

## 2017-03-07 DIAGNOSIS — J156 Pneumonia due to other aerobic Gram-negative bacteria: Secondary | ICD-10-CM | POA: Diagnosis not present

## 2017-03-07 DIAGNOSIS — F39 Unspecified mood [affective] disorder: Secondary | ICD-10-CM | POA: Diagnosis not present

## 2017-03-07 DIAGNOSIS — K219 Gastro-esophageal reflux disease without esophagitis: Secondary | ICD-10-CM | POA: Diagnosis not present

## 2017-03-07 DIAGNOSIS — F0391 Unspecified dementia with behavioral disturbance: Secondary | ICD-10-CM | POA: Diagnosis not present

## 2017-03-07 DIAGNOSIS — R296 Repeated falls: Secondary | ICD-10-CM | POA: Diagnosis not present

## 2017-03-07 DIAGNOSIS — I480 Paroxysmal atrial fibrillation: Secondary | ICD-10-CM | POA: Diagnosis not present

## 2017-03-07 DIAGNOSIS — M25461 Effusion, right knee: Secondary | ICD-10-CM | POA: Diagnosis not present

## 2017-03-09 DIAGNOSIS — E119 Type 2 diabetes mellitus without complications: Secondary | ICD-10-CM | POA: Diagnosis not present

## 2017-03-09 DIAGNOSIS — F0391 Unspecified dementia with behavioral disturbance: Secondary | ICD-10-CM | POA: Diagnosis not present

## 2017-03-09 DIAGNOSIS — M6281 Muscle weakness (generalized): Secondary | ICD-10-CM | POA: Diagnosis not present

## 2017-03-09 DIAGNOSIS — R262 Difficulty in walking, not elsewhere classified: Secondary | ICD-10-CM | POA: Diagnosis not present

## 2017-03-09 DIAGNOSIS — I509 Heart failure, unspecified: Secondary | ICD-10-CM | POA: Diagnosis not present

## 2017-03-09 DIAGNOSIS — J449 Chronic obstructive pulmonary disease, unspecified: Secondary | ICD-10-CM | POA: Diagnosis not present

## 2017-03-10 DIAGNOSIS — E119 Type 2 diabetes mellitus without complications: Secondary | ICD-10-CM | POA: Diagnosis not present

## 2017-03-10 DIAGNOSIS — M6281 Muscle weakness (generalized): Secondary | ICD-10-CM | POA: Diagnosis not present

## 2017-03-10 DIAGNOSIS — R262 Difficulty in walking, not elsewhere classified: Secondary | ICD-10-CM | POA: Diagnosis not present

## 2017-03-10 DIAGNOSIS — F0391 Unspecified dementia with behavioral disturbance: Secondary | ICD-10-CM | POA: Diagnosis not present

## 2017-03-10 DIAGNOSIS — R6889 Other general symptoms and signs: Secondary | ICD-10-CM | POA: Diagnosis not present

## 2017-03-10 DIAGNOSIS — J449 Chronic obstructive pulmonary disease, unspecified: Secondary | ICD-10-CM | POA: Diagnosis not present

## 2017-03-10 DIAGNOSIS — I509 Heart failure, unspecified: Secondary | ICD-10-CM | POA: Diagnosis not present

## 2017-03-10 DIAGNOSIS — D649 Anemia, unspecified: Secondary | ICD-10-CM | POA: Diagnosis not present

## 2017-03-11 DIAGNOSIS — E039 Hypothyroidism, unspecified: Secondary | ICD-10-CM | POA: Diagnosis not present

## 2017-03-11 DIAGNOSIS — I10 Essential (primary) hypertension: Secondary | ICD-10-CM | POA: Diagnosis not present

## 2017-03-11 DIAGNOSIS — E119 Type 2 diabetes mellitus without complications: Secondary | ICD-10-CM | POA: Diagnosis not present

## 2017-03-11 DIAGNOSIS — D649 Anemia, unspecified: Secondary | ICD-10-CM | POA: Diagnosis not present

## 2017-03-12 DIAGNOSIS — I509 Heart failure, unspecified: Secondary | ICD-10-CM | POA: Diagnosis not present

## 2017-03-12 DIAGNOSIS — M6281 Muscle weakness (generalized): Secondary | ICD-10-CM | POA: Diagnosis not present

## 2017-03-12 DIAGNOSIS — F0391 Unspecified dementia with behavioral disturbance: Secondary | ICD-10-CM | POA: Diagnosis not present

## 2017-03-12 DIAGNOSIS — E119 Type 2 diabetes mellitus without complications: Secondary | ICD-10-CM | POA: Diagnosis not present

## 2017-03-12 DIAGNOSIS — R262 Difficulty in walking, not elsewhere classified: Secondary | ICD-10-CM | POA: Diagnosis not present

## 2017-03-12 DIAGNOSIS — J449 Chronic obstructive pulmonary disease, unspecified: Secondary | ICD-10-CM | POA: Diagnosis not present

## 2017-03-15 DIAGNOSIS — R262 Difficulty in walking, not elsewhere classified: Secondary | ICD-10-CM | POA: Diagnosis not present

## 2017-03-15 DIAGNOSIS — I509 Heart failure, unspecified: Secondary | ICD-10-CM | POA: Diagnosis not present

## 2017-03-15 DIAGNOSIS — E119 Type 2 diabetes mellitus without complications: Secondary | ICD-10-CM | POA: Diagnosis not present

## 2017-03-15 DIAGNOSIS — M6281 Muscle weakness (generalized): Secondary | ICD-10-CM | POA: Diagnosis not present

## 2017-03-15 DIAGNOSIS — J449 Chronic obstructive pulmonary disease, unspecified: Secondary | ICD-10-CM | POA: Diagnosis not present

## 2017-03-15 DIAGNOSIS — F0391 Unspecified dementia with behavioral disturbance: Secondary | ICD-10-CM | POA: Diagnosis not present

## 2017-03-16 DIAGNOSIS — F0391 Unspecified dementia with behavioral disturbance: Secondary | ICD-10-CM | POA: Diagnosis not present

## 2017-03-16 DIAGNOSIS — I509 Heart failure, unspecified: Secondary | ICD-10-CM | POA: Diagnosis not present

## 2017-03-16 DIAGNOSIS — E119 Type 2 diabetes mellitus without complications: Secondary | ICD-10-CM | POA: Diagnosis not present

## 2017-03-16 DIAGNOSIS — M6281 Muscle weakness (generalized): Secondary | ICD-10-CM | POA: Diagnosis not present

## 2017-03-16 DIAGNOSIS — R262 Difficulty in walking, not elsewhere classified: Secondary | ICD-10-CM | POA: Diagnosis not present

## 2017-03-16 DIAGNOSIS — J449 Chronic obstructive pulmonary disease, unspecified: Secondary | ICD-10-CM | POA: Diagnosis not present

## 2017-03-17 DIAGNOSIS — M6281 Muscle weakness (generalized): Secondary | ICD-10-CM | POA: Diagnosis not present

## 2017-03-17 DIAGNOSIS — F0391 Unspecified dementia with behavioral disturbance: Secondary | ICD-10-CM | POA: Diagnosis not present

## 2017-03-17 DIAGNOSIS — J449 Chronic obstructive pulmonary disease, unspecified: Secondary | ICD-10-CM | POA: Diagnosis not present

## 2017-03-17 DIAGNOSIS — R262 Difficulty in walking, not elsewhere classified: Secondary | ICD-10-CM | POA: Diagnosis not present

## 2017-03-17 DIAGNOSIS — I509 Heart failure, unspecified: Secondary | ICD-10-CM | POA: Diagnosis not present

## 2017-03-17 DIAGNOSIS — E119 Type 2 diabetes mellitus without complications: Secondary | ICD-10-CM | POA: Diagnosis not present

## 2017-03-22 DIAGNOSIS — E119 Type 2 diabetes mellitus without complications: Secondary | ICD-10-CM | POA: Diagnosis not present

## 2017-03-22 DIAGNOSIS — F0391 Unspecified dementia with behavioral disturbance: Secondary | ICD-10-CM | POA: Diagnosis not present

## 2017-03-22 DIAGNOSIS — R262 Difficulty in walking, not elsewhere classified: Secondary | ICD-10-CM | POA: Diagnosis not present

## 2017-03-22 DIAGNOSIS — M6281 Muscle weakness (generalized): Secondary | ICD-10-CM | POA: Diagnosis not present

## 2017-03-22 DIAGNOSIS — I509 Heart failure, unspecified: Secondary | ICD-10-CM | POA: Diagnosis not present

## 2017-03-22 DIAGNOSIS — J449 Chronic obstructive pulmonary disease, unspecified: Secondary | ICD-10-CM | POA: Diagnosis not present

## 2017-03-24 DIAGNOSIS — I509 Heart failure, unspecified: Secondary | ICD-10-CM | POA: Diagnosis not present

## 2017-03-24 DIAGNOSIS — M6281 Muscle weakness (generalized): Secondary | ICD-10-CM | POA: Diagnosis not present

## 2017-03-24 DIAGNOSIS — E119 Type 2 diabetes mellitus without complications: Secondary | ICD-10-CM | POA: Diagnosis not present

## 2017-03-24 DIAGNOSIS — F0391 Unspecified dementia with behavioral disturbance: Secondary | ICD-10-CM | POA: Diagnosis not present

## 2017-03-24 DIAGNOSIS — R262 Difficulty in walking, not elsewhere classified: Secondary | ICD-10-CM | POA: Diagnosis not present

## 2017-03-24 DIAGNOSIS — J449 Chronic obstructive pulmonary disease, unspecified: Secondary | ICD-10-CM | POA: Diagnosis not present

## 2017-03-25 DIAGNOSIS — F0391 Unspecified dementia with behavioral disturbance: Secondary | ICD-10-CM | POA: Diagnosis not present

## 2017-03-25 DIAGNOSIS — E119 Type 2 diabetes mellitus without complications: Secondary | ICD-10-CM | POA: Diagnosis not present

## 2017-03-25 DIAGNOSIS — M6281 Muscle weakness (generalized): Secondary | ICD-10-CM | POA: Diagnosis not present

## 2017-03-25 DIAGNOSIS — F0281 Dementia in other diseases classified elsewhere with behavioral disturbance: Secondary | ICD-10-CM | POA: Diagnosis not present

## 2017-03-25 DIAGNOSIS — I509 Heart failure, unspecified: Secondary | ICD-10-CM | POA: Diagnosis not present

## 2017-03-25 DIAGNOSIS — J449 Chronic obstructive pulmonary disease, unspecified: Secondary | ICD-10-CM | POA: Diagnosis not present

## 2017-03-25 DIAGNOSIS — E039 Hypothyroidism, unspecified: Secondary | ICD-10-CM | POA: Diagnosis not present

## 2017-03-25 DIAGNOSIS — I10 Essential (primary) hypertension: Secondary | ICD-10-CM | POA: Diagnosis not present

## 2017-03-25 DIAGNOSIS — R262 Difficulty in walking, not elsewhere classified: Secondary | ICD-10-CM | POA: Diagnosis not present

## 2017-03-26 DIAGNOSIS — M6281 Muscle weakness (generalized): Secondary | ICD-10-CM | POA: Diagnosis not present

## 2017-03-26 DIAGNOSIS — R262 Difficulty in walking, not elsewhere classified: Secondary | ICD-10-CM | POA: Diagnosis not present

## 2017-03-26 DIAGNOSIS — F0391 Unspecified dementia with behavioral disturbance: Secondary | ICD-10-CM | POA: Diagnosis not present

## 2017-03-26 DIAGNOSIS — I509 Heart failure, unspecified: Secondary | ICD-10-CM | POA: Diagnosis not present

## 2017-03-26 DIAGNOSIS — E119 Type 2 diabetes mellitus without complications: Secondary | ICD-10-CM | POA: Diagnosis not present

## 2017-03-26 DIAGNOSIS — J449 Chronic obstructive pulmonary disease, unspecified: Secondary | ICD-10-CM | POA: Diagnosis not present

## 2017-03-27 DIAGNOSIS — I10 Essential (primary) hypertension: Secondary | ICD-10-CM | POA: Diagnosis not present

## 2017-03-27 DIAGNOSIS — R0602 Shortness of breath: Secondary | ICD-10-CM | POA: Diagnosis not present

## 2017-03-27 DIAGNOSIS — E119 Type 2 diabetes mellitus without complications: Secondary | ICD-10-CM | POA: Diagnosis not present

## 2017-03-27 DIAGNOSIS — F419 Anxiety disorder, unspecified: Secondary | ICD-10-CM | POA: Diagnosis not present

## 2017-03-27 DIAGNOSIS — N3000 Acute cystitis without hematuria: Secondary | ICD-10-CM | POA: Diagnosis not present

## 2017-03-27 DIAGNOSIS — Z9181 History of falling: Secondary | ICD-10-CM | POA: Diagnosis not present

## 2017-03-27 DIAGNOSIS — F0391 Unspecified dementia with behavioral disturbance: Secondary | ICD-10-CM | POA: Diagnosis not present

## 2017-03-27 DIAGNOSIS — R1311 Dysphagia, oral phase: Secondary | ICD-10-CM | POA: Diagnosis not present

## 2017-03-27 DIAGNOSIS — I480 Paroxysmal atrial fibrillation: Secondary | ICD-10-CM | POA: Diagnosis not present

## 2017-03-27 DIAGNOSIS — F39 Unspecified mood [affective] disorder: Secondary | ICD-10-CM | POA: Diagnosis not present

## 2017-03-27 DIAGNOSIS — E785 Hyperlipidemia, unspecified: Secondary | ICD-10-CM | POA: Diagnosis not present

## 2017-03-27 DIAGNOSIS — R296 Repeated falls: Secondary | ICD-10-CM | POA: Diagnosis not present

## 2017-03-27 DIAGNOSIS — E039 Hypothyroidism, unspecified: Secondary | ICD-10-CM | POA: Diagnosis not present

## 2017-03-27 DIAGNOSIS — H353190 Nonexudative age-related macular degeneration, unspecified eye, stage unspecified: Secondary | ICD-10-CM | POA: Diagnosis not present

## 2017-03-27 DIAGNOSIS — K219 Gastro-esophageal reflux disease without esophagitis: Secondary | ICD-10-CM | POA: Diagnosis not present

## 2017-03-27 DIAGNOSIS — J449 Chronic obstructive pulmonary disease, unspecified: Secondary | ICD-10-CM | POA: Diagnosis not present

## 2017-03-27 DIAGNOSIS — R262 Difficulty in walking, not elsewhere classified: Secondary | ICD-10-CM | POA: Diagnosis not present

## 2017-03-27 DIAGNOSIS — F339 Major depressive disorder, recurrent, unspecified: Secondary | ICD-10-CM | POA: Diagnosis not present

## 2017-03-27 DIAGNOSIS — J156 Pneumonia due to other aerobic Gram-negative bacteria: Secondary | ICD-10-CM | POA: Diagnosis not present

## 2017-03-27 DIAGNOSIS — F0281 Dementia in other diseases classified elsewhere with behavioral disturbance: Secondary | ICD-10-CM | POA: Diagnosis not present

## 2017-03-27 DIAGNOSIS — M6281 Muscle weakness (generalized): Secondary | ICD-10-CM | POA: Diagnosis not present

## 2017-03-27 DIAGNOSIS — M25561 Pain in right knee: Secondary | ICD-10-CM | POA: Diagnosis not present

## 2017-03-27 DIAGNOSIS — R634 Abnormal weight loss: Secondary | ICD-10-CM | POA: Diagnosis not present

## 2017-03-27 DIAGNOSIS — M25461 Effusion, right knee: Secondary | ICD-10-CM | POA: Diagnosis not present

## 2017-03-27 DIAGNOSIS — I509 Heart failure, unspecified: Secondary | ICD-10-CM | POA: Diagnosis not present

## 2017-03-30 DIAGNOSIS — J449 Chronic obstructive pulmonary disease, unspecified: Secondary | ICD-10-CM | POA: Diagnosis not present

## 2017-03-30 DIAGNOSIS — E119 Type 2 diabetes mellitus without complications: Secondary | ICD-10-CM | POA: Diagnosis not present

## 2017-03-30 DIAGNOSIS — I509 Heart failure, unspecified: Secondary | ICD-10-CM | POA: Diagnosis not present

## 2017-03-30 DIAGNOSIS — M6281 Muscle weakness (generalized): Secondary | ICD-10-CM | POA: Diagnosis not present

## 2017-03-30 DIAGNOSIS — R262 Difficulty in walking, not elsewhere classified: Secondary | ICD-10-CM | POA: Diagnosis not present

## 2017-03-30 DIAGNOSIS — F0391 Unspecified dementia with behavioral disturbance: Secondary | ICD-10-CM | POA: Diagnosis not present

## 2017-03-31 DIAGNOSIS — M6281 Muscle weakness (generalized): Secondary | ICD-10-CM | POA: Diagnosis not present

## 2017-03-31 DIAGNOSIS — J449 Chronic obstructive pulmonary disease, unspecified: Secondary | ICD-10-CM | POA: Diagnosis not present

## 2017-03-31 DIAGNOSIS — I509 Heart failure, unspecified: Secondary | ICD-10-CM | POA: Diagnosis not present

## 2017-03-31 DIAGNOSIS — E119 Type 2 diabetes mellitus without complications: Secondary | ICD-10-CM | POA: Diagnosis not present

## 2017-03-31 DIAGNOSIS — F0391 Unspecified dementia with behavioral disturbance: Secondary | ICD-10-CM | POA: Diagnosis not present

## 2017-03-31 DIAGNOSIS — R262 Difficulty in walking, not elsewhere classified: Secondary | ICD-10-CM | POA: Diagnosis not present

## 2017-04-01 DIAGNOSIS — R262 Difficulty in walking, not elsewhere classified: Secondary | ICD-10-CM | POA: Diagnosis not present

## 2017-04-01 DIAGNOSIS — J449 Chronic obstructive pulmonary disease, unspecified: Secondary | ICD-10-CM | POA: Diagnosis not present

## 2017-04-01 DIAGNOSIS — M6281 Muscle weakness (generalized): Secondary | ICD-10-CM | POA: Diagnosis not present

## 2017-04-01 DIAGNOSIS — I509 Heart failure, unspecified: Secondary | ICD-10-CM | POA: Diagnosis not present

## 2017-04-01 DIAGNOSIS — E119 Type 2 diabetes mellitus without complications: Secondary | ICD-10-CM | POA: Diagnosis not present

## 2017-04-01 DIAGNOSIS — F0391 Unspecified dementia with behavioral disturbance: Secondary | ICD-10-CM | POA: Diagnosis not present

## 2017-04-02 DIAGNOSIS — E119 Type 2 diabetes mellitus without complications: Secondary | ICD-10-CM | POA: Diagnosis not present

## 2017-04-02 DIAGNOSIS — J449 Chronic obstructive pulmonary disease, unspecified: Secondary | ICD-10-CM | POA: Diagnosis not present

## 2017-04-02 DIAGNOSIS — F0391 Unspecified dementia with behavioral disturbance: Secondary | ICD-10-CM | POA: Diagnosis not present

## 2017-04-02 DIAGNOSIS — R262 Difficulty in walking, not elsewhere classified: Secondary | ICD-10-CM | POA: Diagnosis not present

## 2017-04-02 DIAGNOSIS — R1110 Vomiting, unspecified: Secondary | ICD-10-CM | POA: Diagnosis not present

## 2017-04-02 DIAGNOSIS — M6281 Muscle weakness (generalized): Secondary | ICD-10-CM | POA: Diagnosis not present

## 2017-04-02 DIAGNOSIS — F0281 Dementia in other diseases classified elsewhere with behavioral disturbance: Secondary | ICD-10-CM | POA: Diagnosis not present

## 2017-04-02 DIAGNOSIS — I509 Heart failure, unspecified: Secondary | ICD-10-CM | POA: Diagnosis not present

## 2017-04-03 DIAGNOSIS — J449 Chronic obstructive pulmonary disease, unspecified: Secondary | ICD-10-CM | POA: Diagnosis not present

## 2017-04-03 DIAGNOSIS — R262 Difficulty in walking, not elsewhere classified: Secondary | ICD-10-CM | POA: Diagnosis not present

## 2017-04-03 DIAGNOSIS — I509 Heart failure, unspecified: Secondary | ICD-10-CM | POA: Diagnosis not present

## 2017-04-03 DIAGNOSIS — M6281 Muscle weakness (generalized): Secondary | ICD-10-CM | POA: Diagnosis not present

## 2017-04-03 DIAGNOSIS — E119 Type 2 diabetes mellitus without complications: Secondary | ICD-10-CM | POA: Diagnosis not present

## 2017-04-03 DIAGNOSIS — F0391 Unspecified dementia with behavioral disturbance: Secondary | ICD-10-CM | POA: Diagnosis not present

## 2017-04-06 DIAGNOSIS — R262 Difficulty in walking, not elsewhere classified: Secondary | ICD-10-CM | POA: Diagnosis not present

## 2017-04-06 DIAGNOSIS — E119 Type 2 diabetes mellitus without complications: Secondary | ICD-10-CM | POA: Diagnosis not present

## 2017-04-06 DIAGNOSIS — F0391 Unspecified dementia with behavioral disturbance: Secondary | ICD-10-CM | POA: Diagnosis not present

## 2017-04-06 DIAGNOSIS — M6281 Muscle weakness (generalized): Secondary | ICD-10-CM | POA: Diagnosis not present

## 2017-04-06 DIAGNOSIS — I509 Heart failure, unspecified: Secondary | ICD-10-CM | POA: Diagnosis not present

## 2017-04-06 DIAGNOSIS — J449 Chronic obstructive pulmonary disease, unspecified: Secondary | ICD-10-CM | POA: Diagnosis not present

## 2017-04-07 DIAGNOSIS — R262 Difficulty in walking, not elsewhere classified: Secondary | ICD-10-CM | POA: Diagnosis not present

## 2017-04-07 DIAGNOSIS — J449 Chronic obstructive pulmonary disease, unspecified: Secondary | ICD-10-CM | POA: Diagnosis not present

## 2017-04-07 DIAGNOSIS — I509 Heart failure, unspecified: Secondary | ICD-10-CM | POA: Diagnosis not present

## 2017-04-07 DIAGNOSIS — M6281 Muscle weakness (generalized): Secondary | ICD-10-CM | POA: Diagnosis not present

## 2017-04-07 DIAGNOSIS — F0391 Unspecified dementia with behavioral disturbance: Secondary | ICD-10-CM | POA: Diagnosis not present

## 2017-04-07 DIAGNOSIS — E119 Type 2 diabetes mellitus without complications: Secondary | ICD-10-CM | POA: Diagnosis not present

## 2017-04-08 DIAGNOSIS — R262 Difficulty in walking, not elsewhere classified: Secondary | ICD-10-CM | POA: Diagnosis not present

## 2017-04-08 DIAGNOSIS — M6281 Muscle weakness (generalized): Secondary | ICD-10-CM | POA: Diagnosis not present

## 2017-04-08 DIAGNOSIS — I509 Heart failure, unspecified: Secondary | ICD-10-CM | POA: Diagnosis not present

## 2017-04-08 DIAGNOSIS — E119 Type 2 diabetes mellitus without complications: Secondary | ICD-10-CM | POA: Diagnosis not present

## 2017-04-08 DIAGNOSIS — J449 Chronic obstructive pulmonary disease, unspecified: Secondary | ICD-10-CM | POA: Diagnosis not present

## 2017-04-08 DIAGNOSIS — F0391 Unspecified dementia with behavioral disturbance: Secondary | ICD-10-CM | POA: Diagnosis not present

## 2017-04-09 DIAGNOSIS — E119 Type 2 diabetes mellitus without complications: Secondary | ICD-10-CM | POA: Diagnosis not present

## 2017-04-09 DIAGNOSIS — J449 Chronic obstructive pulmonary disease, unspecified: Secondary | ICD-10-CM | POA: Diagnosis not present

## 2017-04-09 DIAGNOSIS — I509 Heart failure, unspecified: Secondary | ICD-10-CM | POA: Diagnosis not present

## 2017-04-09 DIAGNOSIS — M6281 Muscle weakness (generalized): Secondary | ICD-10-CM | POA: Diagnosis not present

## 2017-04-09 DIAGNOSIS — F0391 Unspecified dementia with behavioral disturbance: Secondary | ICD-10-CM | POA: Diagnosis not present

## 2017-04-09 DIAGNOSIS — R262 Difficulty in walking, not elsewhere classified: Secondary | ICD-10-CM | POA: Diagnosis not present

## 2017-04-12 DIAGNOSIS — E119 Type 2 diabetes mellitus without complications: Secondary | ICD-10-CM | POA: Diagnosis not present

## 2017-04-12 DIAGNOSIS — F0391 Unspecified dementia with behavioral disturbance: Secondary | ICD-10-CM | POA: Diagnosis not present

## 2017-04-12 DIAGNOSIS — I509 Heart failure, unspecified: Secondary | ICD-10-CM | POA: Diagnosis not present

## 2017-04-12 DIAGNOSIS — M6281 Muscle weakness (generalized): Secondary | ICD-10-CM | POA: Diagnosis not present

## 2017-04-12 DIAGNOSIS — J449 Chronic obstructive pulmonary disease, unspecified: Secondary | ICD-10-CM | POA: Diagnosis not present

## 2017-04-12 DIAGNOSIS — R262 Difficulty in walking, not elsewhere classified: Secondary | ICD-10-CM | POA: Diagnosis not present

## 2017-04-14 DIAGNOSIS — F0391 Unspecified dementia with behavioral disturbance: Secondary | ICD-10-CM | POA: Diagnosis not present

## 2017-04-14 DIAGNOSIS — E119 Type 2 diabetes mellitus without complications: Secondary | ICD-10-CM | POA: Diagnosis not present

## 2017-04-14 DIAGNOSIS — R262 Difficulty in walking, not elsewhere classified: Secondary | ICD-10-CM | POA: Diagnosis not present

## 2017-04-14 DIAGNOSIS — I509 Heart failure, unspecified: Secondary | ICD-10-CM | POA: Diagnosis not present

## 2017-04-14 DIAGNOSIS — M6281 Muscle weakness (generalized): Secondary | ICD-10-CM | POA: Diagnosis not present

## 2017-04-14 DIAGNOSIS — J449 Chronic obstructive pulmonary disease, unspecified: Secondary | ICD-10-CM | POA: Diagnosis not present

## 2017-04-15 DIAGNOSIS — M6281 Muscle weakness (generalized): Secondary | ICD-10-CM | POA: Diagnosis not present

## 2017-04-15 DIAGNOSIS — E119 Type 2 diabetes mellitus without complications: Secondary | ICD-10-CM | POA: Diagnosis not present

## 2017-04-15 DIAGNOSIS — I509 Heart failure, unspecified: Secondary | ICD-10-CM | POA: Diagnosis not present

## 2017-04-15 DIAGNOSIS — F0391 Unspecified dementia with behavioral disturbance: Secondary | ICD-10-CM | POA: Diagnosis not present

## 2017-04-15 DIAGNOSIS — R262 Difficulty in walking, not elsewhere classified: Secondary | ICD-10-CM | POA: Diagnosis not present

## 2017-04-15 DIAGNOSIS — J449 Chronic obstructive pulmonary disease, unspecified: Secondary | ICD-10-CM | POA: Diagnosis not present

## 2017-04-16 DIAGNOSIS — M6281 Muscle weakness (generalized): Secondary | ICD-10-CM | POA: Diagnosis not present

## 2017-04-16 DIAGNOSIS — F0391 Unspecified dementia with behavioral disturbance: Secondary | ICD-10-CM | POA: Diagnosis not present

## 2017-04-16 DIAGNOSIS — I509 Heart failure, unspecified: Secondary | ICD-10-CM | POA: Diagnosis not present

## 2017-04-16 DIAGNOSIS — J449 Chronic obstructive pulmonary disease, unspecified: Secondary | ICD-10-CM | POA: Diagnosis not present

## 2017-04-16 DIAGNOSIS — R262 Difficulty in walking, not elsewhere classified: Secondary | ICD-10-CM | POA: Diagnosis not present

## 2017-04-16 DIAGNOSIS — E119 Type 2 diabetes mellitus without complications: Secondary | ICD-10-CM | POA: Diagnosis not present

## 2017-04-19 DIAGNOSIS — E119 Type 2 diabetes mellitus without complications: Secondary | ICD-10-CM | POA: Diagnosis not present

## 2017-04-19 DIAGNOSIS — M6281 Muscle weakness (generalized): Secondary | ICD-10-CM | POA: Diagnosis not present

## 2017-04-19 DIAGNOSIS — R262 Difficulty in walking, not elsewhere classified: Secondary | ICD-10-CM | POA: Diagnosis not present

## 2017-04-19 DIAGNOSIS — F0391 Unspecified dementia with behavioral disturbance: Secondary | ICD-10-CM | POA: Diagnosis not present

## 2017-04-19 DIAGNOSIS — J449 Chronic obstructive pulmonary disease, unspecified: Secondary | ICD-10-CM | POA: Diagnosis not present

## 2017-04-19 DIAGNOSIS — I509 Heart failure, unspecified: Secondary | ICD-10-CM | POA: Diagnosis not present

## 2017-04-20 DIAGNOSIS — I10 Essential (primary) hypertension: Secondary | ICD-10-CM | POA: Diagnosis not present

## 2017-04-20 DIAGNOSIS — M6281 Muscle weakness (generalized): Secondary | ICD-10-CM | POA: Diagnosis not present

## 2017-04-20 DIAGNOSIS — R262 Difficulty in walking, not elsewhere classified: Secondary | ICD-10-CM | POA: Diagnosis not present

## 2017-04-20 DIAGNOSIS — E039 Hypothyroidism, unspecified: Secondary | ICD-10-CM | POA: Diagnosis not present

## 2017-04-21 DIAGNOSIS — I509 Heart failure, unspecified: Secondary | ICD-10-CM | POA: Diagnosis not present

## 2017-04-21 DIAGNOSIS — E119 Type 2 diabetes mellitus without complications: Secondary | ICD-10-CM | POA: Diagnosis not present

## 2017-04-21 DIAGNOSIS — R262 Difficulty in walking, not elsewhere classified: Secondary | ICD-10-CM | POA: Diagnosis not present

## 2017-04-21 DIAGNOSIS — M6281 Muscle weakness (generalized): Secondary | ICD-10-CM | POA: Diagnosis not present

## 2017-04-21 DIAGNOSIS — J449 Chronic obstructive pulmonary disease, unspecified: Secondary | ICD-10-CM | POA: Diagnosis not present

## 2017-04-21 DIAGNOSIS — F0391 Unspecified dementia with behavioral disturbance: Secondary | ICD-10-CM | POA: Diagnosis not present

## 2017-04-22 DIAGNOSIS — F0391 Unspecified dementia with behavioral disturbance: Secondary | ICD-10-CM | POA: Diagnosis not present

## 2017-04-22 DIAGNOSIS — B351 Tinea unguium: Secondary | ICD-10-CM | POA: Diagnosis not present

## 2017-04-22 DIAGNOSIS — E1140 Type 2 diabetes mellitus with diabetic neuropathy, unspecified: Secondary | ICD-10-CM | POA: Diagnosis not present

## 2017-04-22 DIAGNOSIS — L603 Nail dystrophy: Secondary | ICD-10-CM | POA: Diagnosis not present

## 2017-04-22 DIAGNOSIS — J449 Chronic obstructive pulmonary disease, unspecified: Secondary | ICD-10-CM | POA: Diagnosis not present

## 2017-04-22 DIAGNOSIS — I480 Paroxysmal atrial fibrillation: Secondary | ICD-10-CM | POA: Diagnosis not present

## 2017-04-22 DIAGNOSIS — E119 Type 2 diabetes mellitus without complications: Secondary | ICD-10-CM | POA: Diagnosis not present

## 2017-04-22 DIAGNOSIS — M6281 Muscle weakness (generalized): Secondary | ICD-10-CM | POA: Diagnosis not present

## 2017-04-22 DIAGNOSIS — I509 Heart failure, unspecified: Secondary | ICD-10-CM | POA: Diagnosis not present

## 2017-04-22 DIAGNOSIS — Z794 Long term (current) use of insulin: Secondary | ICD-10-CM | POA: Diagnosis not present

## 2017-04-22 DIAGNOSIS — I10 Essential (primary) hypertension: Secondary | ICD-10-CM | POA: Diagnosis not present

## 2017-04-22 DIAGNOSIS — R262 Difficulty in walking, not elsewhere classified: Secondary | ICD-10-CM | POA: Diagnosis not present

## 2017-04-23 DIAGNOSIS — R262 Difficulty in walking, not elsewhere classified: Secondary | ICD-10-CM | POA: Diagnosis not present

## 2017-04-23 DIAGNOSIS — M6281 Muscle weakness (generalized): Secondary | ICD-10-CM | POA: Diagnosis not present

## 2017-04-23 DIAGNOSIS — F0391 Unspecified dementia with behavioral disturbance: Secondary | ICD-10-CM | POA: Diagnosis not present

## 2017-04-23 DIAGNOSIS — I509 Heart failure, unspecified: Secondary | ICD-10-CM | POA: Diagnosis not present

## 2017-04-23 DIAGNOSIS — E119 Type 2 diabetes mellitus without complications: Secondary | ICD-10-CM | POA: Diagnosis not present

## 2017-04-23 DIAGNOSIS — J449 Chronic obstructive pulmonary disease, unspecified: Secondary | ICD-10-CM | POA: Diagnosis not present

## 2017-04-26 DIAGNOSIS — E119 Type 2 diabetes mellitus without complications: Secondary | ICD-10-CM | POA: Diagnosis not present

## 2017-04-26 DIAGNOSIS — J449 Chronic obstructive pulmonary disease, unspecified: Secondary | ICD-10-CM | POA: Diagnosis not present

## 2017-04-26 DIAGNOSIS — M6281 Muscle weakness (generalized): Secondary | ICD-10-CM | POA: Diagnosis not present

## 2017-04-26 DIAGNOSIS — F0391 Unspecified dementia with behavioral disturbance: Secondary | ICD-10-CM | POA: Diagnosis not present

## 2017-04-26 DIAGNOSIS — I509 Heart failure, unspecified: Secondary | ICD-10-CM | POA: Diagnosis not present

## 2017-04-26 DIAGNOSIS — R262 Difficulty in walking, not elsewhere classified: Secondary | ICD-10-CM | POA: Diagnosis not present

## 2017-09-25 DEATH — deceased
